# Patient Record
Sex: Female | Born: 1955 | Race: White | Hispanic: No | Marital: Married | Smoking: Former smoker
Health system: Southern US, Community
[De-identification: ages and names within clinical notes are randomized; demographics above are authoritative.]

## PROBLEM LIST (undated history)

## (undated) DIAGNOSIS — N809 Endometriosis, unspecified: Secondary | ICD-10-CM

## (undated) DIAGNOSIS — G43909 Migraine, unspecified, not intractable, without status migrainosus: Secondary | ICD-10-CM

## (undated) DIAGNOSIS — R12 Heartburn: Secondary | ICD-10-CM

## (undated) DIAGNOSIS — M1990 Unspecified osteoarthritis, unspecified site: Secondary | ICD-10-CM

## (undated) DIAGNOSIS — H18519 Endothelial corneal dystrophy, unspecified eye: Secondary | ICD-10-CM

## (undated) DIAGNOSIS — E785 Hyperlipidemia, unspecified: Secondary | ICD-10-CM

## (undated) DIAGNOSIS — I10 Essential (primary) hypertension: Secondary | ICD-10-CM

## (undated) DIAGNOSIS — K219 Gastro-esophageal reflux disease without esophagitis: Secondary | ICD-10-CM

## (undated) DIAGNOSIS — K635 Polyp of colon: Secondary | ICD-10-CM

## (undated) DIAGNOSIS — Z800 Family history of malignant neoplasm of digestive organs: Secondary | ICD-10-CM

## (undated) DIAGNOSIS — Z83719 Family history of colon polyps, unspecified: Secondary | ICD-10-CM

## (undated) DIAGNOSIS — M8580 Other specified disorders of bone density and structure, unspecified site: Secondary | ICD-10-CM

## (undated) DIAGNOSIS — Z9882 Breast implant status: Secondary | ICD-10-CM

## (undated) DIAGNOSIS — Z860100 Personal history of colon polyps, unspecified: Secondary | ICD-10-CM

## (undated) DIAGNOSIS — B977 Papillomavirus as the cause of diseases classified elsewhere: Secondary | ICD-10-CM

## (undated) DIAGNOSIS — C50919 Malignant neoplasm of unspecified site of unspecified female breast: Secondary | ICD-10-CM

## (undated) DIAGNOSIS — H269 Unspecified cataract: Secondary | ICD-10-CM

## (undated) DIAGNOSIS — E559 Vitamin D deficiency, unspecified: Secondary | ICD-10-CM

## (undated) DIAGNOSIS — Z8371 Family history of colonic polyps: Secondary | ICD-10-CM

## (undated) DIAGNOSIS — B009 Herpesviral infection, unspecified: Secondary | ICD-10-CM

## (undated) DIAGNOSIS — Z90710 Acquired absence of both cervix and uterus: Secondary | ICD-10-CM

## (undated) HISTORY — PX: WISDOM TOOTH EXTRACTION: SHX21

## (undated) HISTORY — DX: Hyperlipidemia, unspecified: E78.5

## (undated) HISTORY — DX: Unspecified cataract: H26.9

## (undated) HISTORY — DX: Personal history of colonic polyps: Z86.010

## (undated) HISTORY — DX: Polyp of colon: K63.5

## (undated) HISTORY — PX: BREAST BIOPSY: SHX20

## (undated) HISTORY — DX: Unspecified osteoarthritis, unspecified site: M19.90

## (undated) HISTORY — DX: Herpesviral infection, unspecified: B00.9

## (undated) HISTORY — DX: Breast implant status: Z98.82

## (undated) HISTORY — DX: Vitamin D deficiency, unspecified: E55.9

## (undated) HISTORY — DX: Other specified disorders of bone density and structure, unspecified site: M85.80

## (undated) HISTORY — PX: ABDOMINAL HYSTERECTOMY: SHX81

## (undated) HISTORY — DX: Gastro-esophageal reflux disease without esophagitis: K21.9

## (undated) HISTORY — PX: PARTIAL HYSTERECTOMY: SHX80

## (undated) HISTORY — DX: Endothelial corneal dystrophy, unspecified eye: H18.519

## (undated) HISTORY — DX: Family history of malignant neoplasm of digestive organs: Z80.0

## (undated) HISTORY — DX: Family history of colon polyps, unspecified: Z83.719

## (undated) HISTORY — PX: BREAST LUMPECTOMY: SHX2

## (undated) HISTORY — DX: Malignant neoplasm of unspecified site of unspecified female breast: C50.919

## (undated) HISTORY — PX: BREAST ENHANCEMENT SURGERY: SHX7

## (undated) HISTORY — DX: Endometriosis, unspecified: N80.9

## (undated) HISTORY — DX: Essential (primary) hypertension: I10

## (undated) HISTORY — DX: Migraine, unspecified, not intractable, without status migrainosus: G43.909

## (undated) HISTORY — DX: Papillomavirus as the cause of diseases classified elsewhere: B97.7

## (undated) HISTORY — DX: Heartburn: R12

## (undated) HISTORY — DX: Personal history of colon polyps, unspecified: Z86.0100

## (undated) HISTORY — PX: CATARACT EXTRACTION, BILATERAL: SHX1313

## (undated) HISTORY — PX: TONSILLECTOMY: SUR1361

## (undated) HISTORY — DX: Family history of colonic polyps: Z83.71

## (undated) HISTORY — DX: Acquired absence of both cervix and uterus: Z90.710

---

## 1989-12-14 DIAGNOSIS — Z90710 Acquired absence of both cervix and uterus: Secondary | ICD-10-CM

## 1989-12-14 HISTORY — DX: Acquired absence of both cervix and uterus: Z90.710

## 1991-12-15 HISTORY — PX: PLACEMENT OF BREAST IMPLANTS: SHX6334

## 1998-08-21 ENCOUNTER — Other Ambulatory Visit: Admission: RE | Admit: 1998-08-21 | Discharge: 1998-08-21 | Payer: Self-pay | Admitting: Family Medicine

## 1998-10-19 ENCOUNTER — Ambulatory Visit (HOSPITAL_COMMUNITY): Admission: RE | Admit: 1998-10-19 | Discharge: 1998-10-19 | Payer: Self-pay | Admitting: Family Medicine

## 1998-10-19 ENCOUNTER — Encounter: Payer: Self-pay | Admitting: Family Medicine

## 1999-06-28 ENCOUNTER — Emergency Department (HOSPITAL_COMMUNITY): Admission: EM | Admit: 1999-06-28 | Discharge: 1999-06-28 | Payer: Self-pay | Admitting: Emergency Medicine

## 2000-01-27 ENCOUNTER — Other Ambulatory Visit: Admission: RE | Admit: 2000-01-27 | Discharge: 2000-01-27 | Payer: Self-pay | Admitting: Obstetrics and Gynecology

## 2001-01-12 ENCOUNTER — Ambulatory Visit (HOSPITAL_COMMUNITY): Admission: RE | Admit: 2001-01-12 | Discharge: 2001-01-12 | Payer: Self-pay | Admitting: Gastroenterology

## 2001-01-12 ENCOUNTER — Encounter (INDEPENDENT_AMBULATORY_CARE_PROVIDER_SITE_OTHER): Payer: Self-pay | Admitting: Specialist

## 2001-02-01 ENCOUNTER — Other Ambulatory Visit: Admission: RE | Admit: 2001-02-01 | Discharge: 2001-02-01 | Payer: Self-pay | Admitting: Obstetrics and Gynecology

## 2001-09-30 ENCOUNTER — Encounter: Admission: RE | Admit: 2001-09-30 | Discharge: 2001-12-29 | Payer: Self-pay | Admitting: Obstetrics and Gynecology

## 2002-02-22 ENCOUNTER — Other Ambulatory Visit: Admission: RE | Admit: 2002-02-22 | Discharge: 2002-02-22 | Payer: Self-pay | Admitting: Obstetrics and Gynecology

## 2003-05-08 ENCOUNTER — Other Ambulatory Visit: Admission: RE | Admit: 2003-05-08 | Discharge: 2003-05-08 | Payer: Self-pay | Admitting: Obstetrics and Gynecology

## 2004-06-05 ENCOUNTER — Other Ambulatory Visit: Admission: RE | Admit: 2004-06-05 | Discharge: 2004-06-05 | Payer: Self-pay | Admitting: Obstetrics and Gynecology

## 2004-06-06 ENCOUNTER — Encounter: Admission: RE | Admit: 2004-06-06 | Discharge: 2004-06-06 | Payer: Self-pay | Admitting: Obstetrics and Gynecology

## 2004-07-14 ENCOUNTER — Ambulatory Visit (HOSPITAL_COMMUNITY): Admission: RE | Admit: 2004-07-14 | Discharge: 2004-07-14 | Payer: Self-pay | Admitting: Gastroenterology

## 2005-01-16 ENCOUNTER — Encounter: Admission: RE | Admit: 2005-01-16 | Discharge: 2005-01-16 | Payer: Self-pay | Admitting: Obstetrics and Gynecology

## 2005-06-08 ENCOUNTER — Encounter: Admission: RE | Admit: 2005-06-08 | Discharge: 2005-06-08 | Payer: Self-pay | Admitting: Obstetrics and Gynecology

## 2005-06-18 ENCOUNTER — Other Ambulatory Visit: Admission: RE | Admit: 2005-06-18 | Discharge: 2005-06-18 | Payer: Self-pay | Admitting: Obstetrics and Gynecology

## 2006-07-01 ENCOUNTER — Other Ambulatory Visit: Admission: RE | Admit: 2006-07-01 | Discharge: 2006-07-01 | Payer: Self-pay | Admitting: Obstetrics and Gynecology

## 2006-07-16 ENCOUNTER — Encounter: Admission: RE | Admit: 2006-07-16 | Discharge: 2006-07-16 | Payer: Self-pay | Admitting: Obstetrics and Gynecology

## 2007-07-20 ENCOUNTER — Encounter: Admission: RE | Admit: 2007-07-20 | Discharge: 2007-07-20 | Payer: Self-pay | Admitting: Obstetrics and Gynecology

## 2008-07-20 ENCOUNTER — Encounter: Admission: RE | Admit: 2008-07-20 | Discharge: 2008-07-20 | Payer: Self-pay | Admitting: Obstetrics and Gynecology

## 2009-07-23 ENCOUNTER — Encounter: Admission: RE | Admit: 2009-07-23 | Discharge: 2009-07-23 | Payer: Self-pay | Admitting: Obstetrics and Gynecology

## 2010-09-19 ENCOUNTER — Encounter: Admission: RE | Admit: 2010-09-19 | Discharge: 2010-09-19 | Payer: Self-pay | Admitting: Obstetrics and Gynecology

## 2011-01-04 ENCOUNTER — Encounter: Payer: Self-pay | Admitting: Obstetrics and Gynecology

## 2011-05-01 NOTE — Procedures (Signed)
[facility]  Patient:    Aimee Mcclain, Aimee Mcclain                    MRN: [id number] Proc. Date: 01/12/01 Adm. Date:  [id number] Attending:  Louie Bun CC:         Vikki Ports, M.D.                           Procedure Report  PROCEDURE:  Colonoscopy with polypectomy.  INDICATIONS FOR PROCEDURE:  Recent onset of rectal bleeding, with no obvious perianal source on prior inspection.  Also, The patient has a family history of colon cancer in a second-degree relative.  DESCRIPTION OF PROCEDURE:  The patient was placed in the left lateral decubitus position and placed on the pulse monitor with continuous low-flow oxygen delivered by nasal cannula.  She was sedated with 70 mg IV Demerol and 9 mg IV Versed.  The Olympus video colonoscope was inserted into the rectum and advanced to the cecum, confirmed by transillumination of McBurneys and visualization of the ileocecal valve and appendiceal orifice.  The prep was excellent.  The cecum, ascending, transverse and descending colon all appeared normal with no masses, polyps, diverticula or other mucosa abnormalities. Within the sigmoid colon there were seen two small sessile polyps, which were fulgurated by hot biopsy.  A third polyp was seen in the rectum about 2 cm from the anal verge, and it was approximately 8 mm in diameter, slightly pedunculated and was also removed by hot biopsy.  The remainder of the rectum appeared normal.  There was no obvious enlargement of any internal hemorrhoids.  The colonoscope was then withdrawn and the patient returned to the recovery room in stable condition.  She tolerated the procedure well and there were no immediate complications.  IMPRESSION: 1. Rectal and sigmoid colon polyps. 2. Otherwise normal colonoscopy.  PLAN:  Await histology for determination of interval for next surveillance colonoscopy. DD:  01/12/01 TD:  01/13/01 Job: [id number]

## 2011-05-01 NOTE — Op Note (Signed)
NAME:  Aimee Mcclain, Aimee Mcclain                       ACCOUNT NO.:  [id number]   MEDICAL RECORD NO.:  [id number]                   PATIENT TYPE:  AMB   LOCATION:  ENDO                                 FACILITY:  [facility]   PHYSICIAN:  John C. Madilyn Fireman, M.D.                 DATE OF BIRTH:  03-Jul-1956   DATE OF PROCEDURE:  07/14/2004  DATE OF DISCHARGE:                                 OPERATIVE REPORT   PROCEDURE PERFORMED:  Colonoscopy with biopsy.   ENDOSCOPIST:  Barrie Folk, M.D.   INDICATIONS FOR PROCEDURE:  History of adenomatous colon polyps on  colonoscopy three years ago.   DESCRIPTION OF PROCEDURE:  The patient was placed in the left lateral  decubitus position and placed on the pulse monitor with continuous low-flow  oxygen delivered by nasal cannula.  The patient was sedated with 100 mcg of  IV fentanyl and 9 mg of IV Versed.  The Olympus video colonoscope was  inserted into the rectum and advanced to the cecum, confirmed by  transillumination of McBurney's point and visualization of the ileocecal  valve and appendiceal orifice.  The prep was excellent.  The cecum,  ascending, transverse and descending colon appeared normal with no masses,  polyps, diverticula or other mucosal abnormalities.  The rectum likewise  appeared normal and retroflex view of the anus revealed no obvious internal  hemorrhoids.  The scope was then withdrawn and the patient returned to the  recovery room in stable condition.  She tolerated the procedure well.  There  were no immediate complications.   IMPRESSION:  Normal colonoscopy.   PLAN:  Next colonoscopy in five years.                                               John C. Madilyn Fireman, M.D.    JCH/MEDQ  D:  07/14/2004  T:  07/14/2004  Job:  [id number]   cc:   [facility]   Vikki Ports, M.D.  [address]  [place]  [place] [address]  Fax: [phone]

## 2011-10-06 ENCOUNTER — Other Ambulatory Visit: Payer: Self-pay | Admitting: Obstetrics and Gynecology

## 2011-10-06 DIAGNOSIS — Z1231 Encounter for screening mammogram for malignant neoplasm of breast: Secondary | ICD-10-CM

## 2011-11-02 ENCOUNTER — Ambulatory Visit
Admission: RE | Admit: 2011-11-02 | Discharge: 2011-11-02 | Disposition: A | Discharge status: Home / Self Care | Payer: BC Managed Care – PPO | Source: Ambulatory Visit | Attending: Obstetrics and Gynecology | Admitting: Obstetrics and Gynecology

## 2011-11-02 DIAGNOSIS — Z1231 Encounter for screening mammogram for malignant neoplasm of breast: Secondary | ICD-10-CM

## 2012-05-23 ENCOUNTER — Encounter: Payer: Self-pay | Admitting: Obstetrics and Gynecology

## 2012-05-23 ENCOUNTER — Ambulatory Visit (INDEPENDENT_AMBULATORY_CARE_PROVIDER_SITE_OTHER): Payer: BC Managed Care – PPO | Admitting: Obstetrics and Gynecology

## 2012-05-23 VITALS — BP 110/70 | HR 70 | Ht 67.5 in | Wt 176.0 lb

## 2012-05-23 DIAGNOSIS — B977 Papillomavirus as the cause of diseases classified elsewhere: Secondary | ICD-10-CM | POA: Insufficient documentation

## 2012-05-23 DIAGNOSIS — R5383 Other fatigue: Secondary | ICD-10-CM

## 2012-05-23 DIAGNOSIS — K635 Polyp of colon: Secondary | ICD-10-CM | POA: Insufficient documentation

## 2012-05-23 DIAGNOSIS — Z90710 Acquired absence of both cervix and uterus: Secondary | ICD-10-CM | POA: Insufficient documentation

## 2012-05-23 DIAGNOSIS — R5381 Other malaise: Secondary | ICD-10-CM

## 2012-05-23 DIAGNOSIS — Z9882 Breast implant status: Secondary | ICD-10-CM | POA: Insufficient documentation

## 2012-05-23 DIAGNOSIS — N809 Endometriosis, unspecified: Secondary | ICD-10-CM | POA: Insufficient documentation

## 2012-05-23 DIAGNOSIS — Z5181 Encounter for therapeutic drug level monitoring: Secondary | ICD-10-CM

## 2012-05-23 LAB — FOLLICLE STIMULATING HORMONE: FSH: 80.4 m[IU]/mL

## 2012-05-23 LAB — ESTRADIOL: Estradiol: 39.9 pg/mL

## 2012-05-23 LAB — PROGESTERONE: Progesterone: 0.7 ng/mL

## 2012-05-23 MED ORDER — AMBULATORY NON FORMULARY MEDICATION: 1.0000 mL | Freq: Every day | Status: DC

## 2012-05-23 NOTE — Progress Notes (Signed)
55 YO S/P Hysterectomy on BHRT ( Estradiol 0.05 + Progesterone 20 mg Cream) daily complains of awakening tired & sleepy and sleeps every time she sits down.  Nighttime sleep is restless (sleeping off & on) for past several months. She continues to have vaginal dryness and dyspareunia but admits  to not using Vagifem and also has hot flashes.  Hormone tests from 07/2011 revealed elevated progesterone and that amount was decreased however she is having the above symptoms after months of good management.  Has had annual labs done by PCP and they were normal per patient.  A: Menopausal Symptoms     Increased Somnolence  P: Sleep Hygiene and reviewed causes of decreased libido      in women (patient has fatigue and dyspareunia)      Vagifem as directed encouraged along with silicone based     vaginal moisturizer         TSH, FSH, estradiol and progesterone-pending       Estradiol .05mg  + Progesterone 10 mg Cream/64ml      #1 month apply 1 ml qd as directed 11 refills         RTO- AEx or TBA

## 2012-06-20 ENCOUNTER — Telehealth: Payer: Self-pay | Admitting: Obstetrics and Gynecology

## 2012-06-20 NOTE — Telephone Encounter (Signed)
TC TO PT REGARDING MESSAGE. PT WANTED HER TEST RESULTS. INFORMED PT THAT ALL TEST DONE ON 05/23/12 WAS WNL AND PT STATES THAT SHE WANT TO SPEAK WITH EP AND WANT TO KNOW DOES HER TEST RESULTS MEAN THAT PT OVARIES ARE WORKING.

## 2012-06-23 ENCOUNTER — Telehealth: Payer: Self-pay | Admitting: Obstetrics and Gynecology

## 2012-06-23 MED ORDER — AMBULATORY NON FORMULARY MEDICATION: 1.0000 mL | Freq: Every day | Status: DC

## 2012-06-23 NOTE — Telephone Encounter (Signed)
Phone call with patient to reiterate her menopausal state as indicated by [facility] = 80.  Advised that thyroid was normal and estradiol/progesterone levels were consistent with menopause (serum). Patient has resumed her Vagifem twice weekly and continues to complain of hot flushes, sleeplessness and moodiness. To increase estradiol to 0.75 mg and keep progesterone at  20 mg   Patient was agreeable.  To have a saliva kit mailed to repeat testing at the end of August. Nixon Kolton, PA-C

## 2012-09-20 ENCOUNTER — Other Ambulatory Visit: Payer: Self-pay

## 2012-09-20 NOTE — Telephone Encounter (Signed)
Fax refill okd ld 

## 2012-09-29 ENCOUNTER — Other Ambulatory Visit: Payer: Self-pay | Admitting: Obstetrics and Gynecology

## 2012-09-29 DIAGNOSIS — Z1231 Encounter for screening mammogram for malignant neoplasm of breast: Secondary | ICD-10-CM

## 2012-11-16 ENCOUNTER — Telehealth: Payer: Self-pay

## 2012-11-16 MED ORDER — ACYCLOVIR 200 MG PO CAPS: 200.0000 mg | ORAL_CAPSULE | ORAL | Status: AC | PRN

## 2012-11-16 NOTE — Telephone Encounter (Signed)
VM from pt stating that she is needing rx refill of acyclovir 200 mg. Advised pt that it is time for annual. Pt scheduled apt for 01/18/2013. Advised pt that rx would be sent to pharmacy with refills to last until apt time. Pt voiced understanding. Darien Ramus, CMA

## 2012-12-09 ENCOUNTER — Other Ambulatory Visit: Payer: Self-pay | Admitting: Obstetrics and Gynecology

## 2012-12-09 ENCOUNTER — Telehealth: Payer: Self-pay | Admitting: Obstetrics and Gynecology

## 2012-12-09 NOTE — Telephone Encounter (Signed)
Forward msg to EP

## 2012-12-09 NOTE — Telephone Encounter (Signed)
Patient on Estradiol 0.075 mg + Progesterone 20 mg Cream/ 1 mL and wants to discontinue it.  Should begin to use it every other day for the next 3 weeks as a means of tapering it. She may want to take at the same time Rhubarb Root [Estrovera] to help to minimize her symptoms while she tapers.  Leanora Ivanoff is also available at [facility]. Cybill Uriegas, PA-C

## 2012-12-13 ENCOUNTER — Other Ambulatory Visit: Payer: Self-pay | Admitting: Obstetrics and Gynecology

## 2012-12-13 ENCOUNTER — Ambulatory Visit
Admission: RE | Admit: 2012-12-13 | Discharge: 2012-12-13 | Disposition: A | Discharge status: Home / Self Care | Payer: BC Managed Care – PPO | Source: Ambulatory Visit | Attending: Obstetrics and Gynecology | Admitting: Obstetrics and Gynecology

## 2012-12-13 DIAGNOSIS — N63 Unspecified lump in unspecified breast: Secondary | ICD-10-CM

## 2012-12-13 DIAGNOSIS — N644 Mastodynia: Secondary | ICD-10-CM

## 2012-12-13 DIAGNOSIS — Z1231 Encounter for screening mammogram for malignant neoplasm of breast: Secondary | ICD-10-CM

## 2012-12-19 ENCOUNTER — Telehealth: Payer: Self-pay | Admitting: Obstetrics and Gynecology

## 2012-12-19 NOTE — Telephone Encounter (Signed)
Pt advised to start using med every other night per EP telephone note on 12/09/12. Pt upset that she never received call back. Pt voiced understanding, but is wanting to know what she needs to do after 3 weeks of every other night. Should she stop completely or use every 3 nights?  Darien Ramus, CMA

## 2012-12-19 NOTE — Telephone Encounter (Signed)
Call to clarify tapering regimen with patient regarding her hormone therapy.  Left message that a return call would be made.  Maximilien Hayashi, PA-C

## 2012-12-20 ENCOUNTER — Telehealth: Payer: Self-pay | Admitting: Obstetrics and Gynecology

## 2012-12-20 NOTE — Telephone Encounter (Signed)
Call to patient who is completing her current hormone prescription by using it every other day.  She will pick up samples of Minivelle 0.0375 to use once weekly after that then stop (total of 4 weeks).  Patient was agreeable. Pecola Haxton, PA-C

## 2013-01-03 ENCOUNTER — Ambulatory Visit
Admission: RE | Admit: 2013-01-03 | Discharge: 2013-01-03 | Disposition: A | Discharge status: Home / Self Care | Payer: BC Managed Care – PPO | Source: Ambulatory Visit | Attending: Obstetrics and Gynecology | Admitting: Obstetrics and Gynecology

## 2013-01-03 DIAGNOSIS — N644 Mastodynia: Secondary | ICD-10-CM

## 2013-01-03 DIAGNOSIS — N63 Unspecified lump in unspecified breast: Secondary | ICD-10-CM

## 2013-01-18 ENCOUNTER — Ambulatory Visit: Payer: BC Managed Care – PPO | Admitting: Obstetrics and Gynecology

## 2013-01-18 ENCOUNTER — Encounter: Payer: Self-pay | Admitting: Obstetrics and Gynecology

## 2013-01-18 VITALS — BP 128/68 | Ht 67.5 in | Wt 189.0 lb

## 2013-01-18 DIAGNOSIS — Z01419 Encounter for gynecological examination (general) (routine) without abnormal findings: Secondary | ICD-10-CM

## 2013-01-18 MED ORDER — VALACYCLOVIR HCL 500 MG PO TABS: 500.0000 mg | ORAL_TABLET | Freq: Two times a day (BID) | ORAL | Status: DC

## 2013-01-18 MED ORDER — ESTRADIOL 10 MCG VA TABS: 1.0000 | ORAL_TABLET | VAGINAL | Status: DC

## 2013-01-18 NOTE — Progress Notes (Signed)
The patient is taking hormone replacement therapy The patient  is taking a Calcium supplement. Post-menopausal bleeding:no  Last Pap: approximate date 2008 and was normal  Last mammogram: approximate date 01/03/2013 and was normal  Last DEXA scan : T= -1.5    08/2011 Last colonoscopy: 12/2012 biopsy was taken / results not back  Urinary symptoms: none Normal bowel movements: Yes Reports abuse at home: No:   Subjective:    Aimee Mcclain is a 57 y.o. female G2P2002 who presents for annual exam.  The patient has no complaints today.   The following portions of the patient's history were reviewed and updated as appropriate: allergies, current medications, past family history, past medical history, past social history, past surgical history and problem list.  Review of Systems Pertinent items are noted in HPI. Gastrointestinal:No change in bowel habits, no abdominal pain, no rectal bleeding Genitourinary:negative for dysuria, frequency, hematuria, nocturia and urinary incontinence    Objective:     There were no vitals taken for this visit.  Weight:  Wt Readings from Last 1 Encounters:  05/23/12 176 lb (79.833 kg)     BMI: There is no height or weight on file to calculate BMI. General Appearance: Alert, appropriate appearance for age. No acute distress HEENT: Grossly normal Neck / Thyroid: Supple, no masses, nodes or enlargement Lungs: clear to auscultation bilaterally Back: No CVA tenderness Breast Exam: No dimpling, nipple retraction or discharge. No masses or nodes., Normal to inspection and No masses or nodes.No dimpling, nipple retraction or discharge. Cardiovascular: Regular rate and rhythm. S1, S2, no murmur Gastrointestinal: Soft, non-tender, no masses or organomegaly Pelvic Exam: Vulva and vagina appear normal. Bimanual exam reveals normal  Adnexa. Uterus surgically absent Rectovaginal: deferred due to recent colonoscopy Lymphatic Exam: Non-palpable nodes in neck,  clavicular, axillary, or inguinal regions  Skin: no rash or abnormalities Neurologic: Normal gait and speech, no tremor  Psychiatric: Alert and oriented, appropriate affect. Assessment:    Normal gyn exam    Plan:   mammogram return annually or prn Hormone Therapy with EP Weight Gain  Vaginal Dryness Discussed (Pt currently using Vagifem) Estring Discussed    Silverio Lay MD

## 2013-11-30 ENCOUNTER — Other Ambulatory Visit: Payer: Self-pay

## 2013-11-30 DIAGNOSIS — Z1231 Encounter for screening mammogram for malignant neoplasm of breast: Secondary | ICD-10-CM

## 2013-12-29 ENCOUNTER — Other Ambulatory Visit: Payer: Self-pay

## 2013-12-29 DIAGNOSIS — Z1231 Encounter for screening mammogram for malignant neoplasm of breast: Secondary | ICD-10-CM

## 2013-12-29 DIAGNOSIS — Z9882 Breast implant status: Secondary | ICD-10-CM

## 2014-01-05 ENCOUNTER — Ambulatory Visit
Admission: RE | Admit: 2014-01-05 | Discharge: 2014-01-05 | Disposition: A | Discharge status: Home / Self Care | Payer: BC Managed Care – PPO | Source: Ambulatory Visit

## 2014-01-05 DIAGNOSIS — Z1231 Encounter for screening mammogram for malignant neoplasm of breast: Secondary | ICD-10-CM

## 2014-01-05 DIAGNOSIS — Z9882 Breast implant status: Secondary | ICD-10-CM

## 2014-10-15 ENCOUNTER — Encounter: Payer: Self-pay | Admitting: Obstetrics and Gynecology

## 2014-12-11 ENCOUNTER — Other Ambulatory Visit: Payer: Self-pay

## 2014-12-11 DIAGNOSIS — Z1231 Encounter for screening mammogram for malignant neoplasm of breast: Secondary | ICD-10-CM

## 2015-01-08 ENCOUNTER — Ambulatory Visit
Admission: RE | Admit: 2015-01-08 | Discharge: 2015-01-08 | Disposition: A | Discharge status: Home / Self Care | Payer: BLUE CROSS/BLUE SHIELD | Source: Ambulatory Visit

## 2015-01-08 ENCOUNTER — Encounter (INDEPENDENT_AMBULATORY_CARE_PROVIDER_SITE_OTHER): Payer: Self-pay

## 2015-01-08 ENCOUNTER — Other Ambulatory Visit: Payer: Self-pay

## 2015-01-08 DIAGNOSIS — Z1231 Encounter for screening mammogram for malignant neoplasm of breast: Secondary | ICD-10-CM

## 2018-06-03 ENCOUNTER — Encounter: Payer: Self-pay | Admitting: Gastroenterology

## 2018-06-06 ENCOUNTER — Telehealth: Payer: Self-pay

## 2018-06-06 NOTE — Telephone Encounter (Signed)
Spoke to patient to inform her that we have received records from Dr Lenard Simmer ossice.

## 2018-06-06 NOTE — Telephone Encounter (Signed)
-----   Message from Dow Adolph sent at 06/06/2018 11:28 AM EDT ----- Pt has an appt in Aug. And wants to know if we have received her records from Dr. Amedeo Plenty office yet [phone]

## 2018-08-09 ENCOUNTER — Ambulatory Visit (INDEPENDENT_AMBULATORY_CARE_PROVIDER_SITE_OTHER): Payer: BLUE CROSS/BLUE SHIELD | Admitting: Gastroenterology

## 2018-08-09 ENCOUNTER — Encounter: Payer: Self-pay | Admitting: Gastroenterology

## 2018-08-09 VITALS — BP 142/80 | HR 76 | Ht 67.0 in | Wt 183.0 lb

## 2018-08-09 DIAGNOSIS — K219 Gastro-esophageal reflux disease without esophagitis: Secondary | ICD-10-CM | POA: Diagnosis not present

## 2018-08-09 DIAGNOSIS — Z800 Family history of malignant neoplasm of digestive organs: Secondary | ICD-10-CM | POA: Diagnosis not present

## 2018-08-09 DIAGNOSIS — K5900 Constipation, unspecified: Secondary | ICD-10-CM

## 2018-08-09 MED ORDER — PEG 3350-KCL-NA BICARB-NACL 420 G PO SOLR: 4000.0000 mL | ORAL | 0 refills | Status: DC

## 2018-08-09 MED ORDER — OMEPRAZOLE 40 MG PO CPDR: 40.0000 mg | DELAYED_RELEASE_CAPSULE | Freq: Every day | ORAL | 11 refills | Status: DC

## 2018-08-09 NOTE — Patient Instructions (Addendum)
You will be set up for a colonoscopy with double prep protocol (for FH colon cancer). Start miralax 2 doses every single morning for now (for chronic constipation). Start omeprazole 40mg  pill, one pill every morning before your apple. Call if you have side effects.  Normal BMI (Body Mass Index- based on height and weight) is between 19 and 25. Your BMI today is Body mass index is 28.66 kg/m. Marland Kitchen Please consider follow up  regarding your BMI with your Primary Care Provider.

## 2018-08-09 NOTE — Progress Notes (Signed)
HPI: This is a  Very pleasant 62 yo woman  who was referred to me by Earnstine Regal, PA-C  to evaluate chronic constipation, family history colon cancer, GERD  Chief complaint is family history colon cancer, GERD, chronic constipation  Mother dx with colon cancer in her 63s, still living (surgery alone).  She's had polyps removed in the past.  She has never had colon cancer.  Several issues; chronic constipation.  Often goes a week without a BM.  Intermittent BM.    This has been a problem of hers for as long as she can remember.  Fiber supplements tend to make it worse.  She will intermittently have rectal bleeding.  Usually it is very minor however she had a significant event about a week ago where she had dripping blood from her bottom.  It eventually stopped.  She has no anal discomfort.  She has tried MiraLAX and it seems to help briefly, using it usually as a rescue only.  She has chronic GERD which seems to manifest as coughing and an acid taste in her mouth.  Nexium helped but she had headaches.  She now takes Tums several per day.  No dysphasia.  Overall weight fluctuates.    Has bloating, gassiness.    Old Data Reviewed:   Colonoscopy Dr. Teena Irani January 2014 indication "personal history of colonic polyps" findings patchy area of mildly erythematous and granular mucosa found in the ascending colon.  Biopsies were taken.  There is some small internal hemorrhoids.  The examination was otherwise normal.  Dr. Amedeo Plenty recommended a colonoscopy in 5 years "for surveillance".  Pathology from the colon showed "chronic, mildly active colitis, consistent with inflammatory bowel disease".  EGD, Dr. Amedeo Plenty January 2014 indication "esophageal reflux, failure to respond to medical treatment".  Findings esophagus was normal.  There is some mild gastritis in the gastric antrum which was biopsied.  The duodenum was normal.  The biopsies from the stomach showed "benign gastric mucosa.  No H.  pylori dysplasia or malignancy".  There is written instructions from Dr. Amedeo Plenty indicating that he increased her proton pump inhibitor to twice daily.     Review of systems: Pertinent positive and negative review of systems were noted in the above HPI section. All other review negative.   Past Medical History:  Diagnosis Date  . Colon polyps   . Endometriosis   . H/O bilateral breast implants   . HPV in female   . HSV (herpes simplex virus) infection   . S/P TAH (total abdominal hysterectomy) 1991    Past Surgical History:  Procedure Laterality Date  . BREAST ENHANCEMENT SURGERY    . PARTIAL HYSTERECTOMY    . TONSILLECTOMY    . WISDOM TOOTH EXTRACTION      Current Outpatient Medications  Medication Sig Dispense Refill  . estradiol (VIVELLE-DOT) 0.05 MG/24HR patch Place 0.05 mg onto the skin once a week.    . valACYclovir (VALTREX) 500 MG tablet Take 1 tablet (500 mg total) by mouth 2 (two) times daily. 30 tablet 11   No current facility-administered medications for this visit.     Allergies as of 08/09/2018 - Review Complete 08/09/2018  Allergen Reaction Noted  . Ibuprofen Swelling 05/23/2012  . Vioxx [rofecoxib] Swelling and Rash 05/23/2012    Family History  Problem Relation Age of Onset  . Colon cancer Maternal Grandmother   . Heart disease Maternal Grandfather   . Colon cancer Father   . Heart disease Mother   .  Diabetes Brother   . Diabetes Brother   . Cancer Maternal Aunt        lyphoma  . Heart disease Maternal Uncle     Social History   Socioeconomic History  . Marital status: Married    Spouse name: Not on file  . Number of children: Not on file  . Years of education: Not on file  . Highest education level: Not on file  Occupational History  . Not on file  Social Needs  . Financial resource strain: Not on file  . Food insecurity:    Worry: Not on file    Inability: Not on file  . Transportation needs:    Medical: Not on file     Non-medical: Not on file  Tobacco Use  . Smoking status: Never Smoker  . Smokeless tobacco: Never Used  Substance and Sexual Activity  . Alcohol use: Yes    Comment: occasional  . Drug use: No  . Sexual activity: Yes    Comment: hyst  Lifestyle  . Physical activity:    Days per week: Not on file    Minutes per session: Not on file  . Stress: Not on file  Relationships  . Social connections:    Talks on phone: Not on file    Gets together: Not on file    Attends religious service: Not on file    Active member of club or organization: Not on file    Attends meetings of clubs or organizations: Not on file    Relationship status: Not on file  . Intimate partner violence:    Fear of current or ex partner: Not on file    Emotionally abused: Not on file    Physically abused: Not on file    Forced sexual activity: Not on file  Other Topics Concern  . Not on file  Social History Narrative  . Not on file     Physical Exam: BP (!) 142/80   Pulse 76   Ht 5\' 7"  (1.702 m)   Wt 183 lb (83 kg)   BMI 28.66 kg/m  Constitutional: generally well-appearing Psychiatric: alert and oriented x3 Eyes: extraocular movements intact Mouth: oral pharynx moist, no lesions Neck: supple no lymphadenopathy Cardiovascular: heart regular rate and rhythm Lungs: clear to auscultation bilaterally Abdomen: soft, nontender, nondistended, no obvious ascites, no peritoneal signs, normal bowel sounds Extremities: no lower extremity edema bilaterally Skin: no lesions on visible extremities   Assessment and plan: 62 y.o. female with chronic constipation, family history colon cancer, chronic GERD without alarm symptoms  Her mother and her maternal grandfather had colon cancer.  Her last screening examination was a little over 5 years ago and I recommended we repeat that for her now with a colonoscopy at her soonest convenience.  Given her chronic constipation I suggested a double prep protocol.  She has  chronic constipation.  It seems likely functional.  It is been lifelong.  I recommended she try 2 doses of MiraLAX every single day to see if that helps.  I will adjust her medicines at the time of her colonoscopy if needed.  She has chronic GERD with fairly classic symptoms.  Headaches with Nexium.  I am going to try her on omeprazole for now.  She will call or update me the time of her colonoscopy if the omeprazole is helping.  Hopefully she does not have headaches from it as well.    Please see the "Patient Instructions" section for addition details  about the plan.   Owens Loffler, MD [facility] 08/09/2018, 2:36 PM  Cc: Earnstine Regal, PA-C

## 2018-09-27 ENCOUNTER — Encounter: Payer: Self-pay | Admitting: Gastroenterology

## 2018-10-10 ENCOUNTER — Other Ambulatory Visit: Payer: Self-pay

## 2018-10-10 ENCOUNTER — Ambulatory Visit (AMBULATORY_SURGERY_CENTER): Payer: BLUE CROSS/BLUE SHIELD | Admitting: Gastroenterology

## 2018-10-10 ENCOUNTER — Encounter: Payer: Self-pay | Admitting: Gastroenterology

## 2018-10-10 VITALS — BP 117/76 | HR 56 | Temp 97.8°F | Resp 14 | Ht 67.0 in | Wt 183.0 lb

## 2018-10-10 DIAGNOSIS — Z86010 Personal history of colonic polyps: Secondary | ICD-10-CM

## 2018-10-10 DIAGNOSIS — D128 Benign neoplasm of rectum: Secondary | ICD-10-CM

## 2018-10-10 DIAGNOSIS — K639 Disease of intestine, unspecified: Secondary | ICD-10-CM

## 2018-10-10 DIAGNOSIS — Z1211 Encounter for screening for malignant neoplasm of colon: Secondary | ICD-10-CM | POA: Diagnosis not present

## 2018-10-10 DIAGNOSIS — K621 Rectal polyp: Secondary | ICD-10-CM

## 2018-10-10 DIAGNOSIS — Z800 Family history of malignant neoplasm of digestive organs: Secondary | ICD-10-CM | POA: Diagnosis not present

## 2018-10-10 DIAGNOSIS — K529 Noninfective gastroenteritis and colitis, unspecified: Secondary | ICD-10-CM | POA: Diagnosis not present

## 2018-10-10 MED ORDER — SODIUM CHLORIDE 0.9 % IV SOLN: 500.0000 mL | Freq: Once | INTRAVENOUS | Status: DC

## 2018-10-10 NOTE — Op Note (Signed)
[facility] Patient Name: Aimee Mcclain Procedure Date: 10/10/2018 8:18 AM MRN: [id number] Endoscopist: Milus Banister , MD Age: 62 Referring MD:  Date of Birth: Dec 31, 1955 Gender: Female Account #: [id number] Procedure:                Colonoscopy Indications:              Screening in patient at increased risk: Family                            history of 1st-degree relative with colorectal                            cancer; Colonoscopy Dr. Teena Irani January 2014                            indication "personal history of colonic polyps"                            findings patchy area of mildly erythematous and                            granular mucosa found in the ascending colon.                            Biopsies were taken. There is some small internal                            hemorrhoids. The examination was otherwise normal.                            Dr. Amedeo Plenty recommended a colonoscopy in 5 years "for                            surveillance". Pathology from the colon showed                            "chronic, mildly active colitis, consistent with                            inflammatory bowel disease". Mother and father and                            granparent all with colon cancer. Medicines:                Monitored Anesthesia Care Procedure:                Pre-Anesthesia Assessment:                           - Prior to the procedure, a History and Physical                            was performed, and patient medications and  allergies were reviewed. The patient's tolerance of                            previous anesthesia was also reviewed. The risks                            and benefits of the procedure and the sedation                            options and risks were discussed with the patient.                            All questions were answered, and informed consent                            was obtained. Prior  Anticoagulants: The patient has                            taken no previous anticoagulant or antiplatelet                            agents. ASA Grade Assessment: II - A patient with                            mild systemic disease. After reviewing the risks                            and benefits, the patient was deemed in                            satisfactory condition to undergo the procedure.                           After obtaining informed consent, the colonoscope                            was passed under direct vision. Throughout the                            procedure, the patient's blood pressure, pulse, and                            oxygen saturations were monitored continuously. The                            Colonoscope was introduced through the anus and                            advanced to the the cecum, identified by                            appendiceal orifice and ileocecal valve. The  colonoscopy was performed without difficulty. The                            patient tolerated the procedure well. The quality                            of the bowel preparation was good. The ileocecal                            valve, appendiceal orifice, and rectum were                            photographed. Scope In: 8:26:36 AM Scope Out: 8:48:48 AM Scope Withdrawal Time: 0 hours 16 minutes 49 seconds  Total Procedure Duration: 0 hours 22 minutes 12 seconds  Findings:                 Several small scattered erosions throughout the                            colon (right colon > left colon). They were sampled                            with forceps, path jar 1.                           A 3 mm polyp was found in the rectum. The polyp was                            sessile. The polyp was removed with a cold snare.                            Resection and retrieval were complete.                           The exam was otherwise without abnormality on                             direct and retroflexion views. Complications:            No immediate complications. Estimated blood loss:                            None. Estimated Blood Loss:     Estimated blood loss: none. Impression:               - Erythematous mucosa in the entire examined colon.                            Biopsied.                           - One 3 mm polyp in the rectum, removed with a cold                            snare.  Resected and retrieved.                           - The examination was otherwise normal on direct                            and retroflexion views. Recommendation:           - Patient has a contact number available for                            emergencies. The signs and symptoms of potential                            delayed complications were discussed with the                            patient. Return to normal activities tomorrow.                            Written discharge instructions were provided to the                            patient.                           - Resume previous diet.                           - Continue present medications.                           - Dr. Ardis Hughs' office will arrange referral to [facility] given your strong                            family history of colon cancer.                           You will receive a letter within 2-3 weeks with the                            pathology results and my final recommendations.                           If the polyp(s) is proven to be 'pre-cancerous' on                            pathology, you will need repeat colonoscopy in 5                            years. Milus Banister, MD 10/10/2018 8:53:50 AM This report has been signed electronically.

## 2018-10-10 NOTE — Patient Instructions (Signed)
Handouts given on polyps and biopsies were taken today on areas of inflammation.   YOU HAD AN ENDOSCOPIC PROCEDURE TODAY AT [facility]:   Refer to the procedure report that was given to you for any specific questions about what was found during the examination.  If the procedure report does not answer your questions, please call your gastroenterologist to clarify.  If you requested that your care partner not be given the details of your procedure findings, then the procedure report has been included in a sealed envelope for you to review at your convenience later.  YOU SHOULD EXPECT: Some feelings of bloating in the abdomen. Passage of more gas than usual.  Walking can help get rid of the air that was put into your GI tract during the procedure and reduce the bloating. If you had a lower endoscopy (such as a colonoscopy or flexible sigmoidoscopy) you may notice spotting of blood in your stool or on the toilet paper. If you underwent a bowel prep for your procedure, you may not have a normal bowel movement for a few days.  Please Note:  You might notice some irritation and congestion in your nose or some drainage.  This is from the oxygen used during your procedure.  There is no need for concern and it should clear up in a day or so.  SYMPTOMS TO REPORT IMMEDIATELY:   Following lower endoscopy (colonoscopy or flexible sigmoidoscopy):  Excessive amounts of blood in the stool  Significant tenderness or worsening of abdominal pains  Swelling of the abdomen that is new, acute  Fever of 100F or higher   For urgent or emergent issues, a gastroenterologist can be reached at any hour by calling [phone].   DIET:  We do recommend a small meal at first, but then you may proceed to your regular diet.  Drink plenty of fluids but you should avoid alcoholic beverages for 24 hours.  ACTIVITY:  You should plan to take it easy for the rest of today and you should NOT DRIVE or use heavy  machinery until tomorrow (because of the sedation medicines used during the test).    FOLLOW UP: Our staff will call the number listed on your records the next business day following your procedure to check on you and address any questions or concerns that you may have regarding the information given to you following your procedure. If we do not reach you, we will leave a message.  However, if you are feeling well and you are not experiencing any problems, there is no need to return our call.  We will assume that you have returned to your regular daily activities without incident.  If any biopsies were taken you will be contacted by phone or by letter within the next 1-3 weeks.  Please call us at [phone] if you have not heard about the biopsies in 3 weeks.    SIGNATURES/CONFIDENTIALITY: You and/or your care partner have signed paperwork which will be entered into your electronic medical record.  These signatures attest to the fact that that the information above on your After Visit Summary has been reviewed and is understood.  Full responsibility of the confidentiality of this discharge information lies with you and/or your care-partner.

## 2018-10-10 NOTE — Progress Notes (Signed)
Called to room to assist during endoscopic procedure.  Patient ID and intended procedure confirmed with present staff. Received instructions for my participation in the procedure from the performing physician.  

## 2018-10-10 NOTE — Progress Notes (Signed)
History reviewed today 

## 2018-10-10 NOTE — Progress Notes (Signed)
Report given to PACU, vss 

## 2018-10-11 ENCOUNTER — Telehealth: Payer: Self-pay | Admitting: *Deleted

## 2018-10-11 ENCOUNTER — Other Ambulatory Visit: Payer: Self-pay

## 2018-10-11 DIAGNOSIS — Z800 Family history of malignant neoplasm of digestive organs: Secondary | ICD-10-CM

## 2018-10-11 NOTE — Telephone Encounter (Signed)
No answer for post procedure call back. Left message and will attempt to call back later this afternoon. Sm 

## 2018-10-11 NOTE — Telephone Encounter (Signed)
No answer for post procedure call back left message for patient to call with questions or concerns. Sm 

## 2018-11-21 ENCOUNTER — Encounter: Payer: Self-pay | Admitting: Gastroenterology

## 2018-11-21 ENCOUNTER — Ambulatory Visit (INDEPENDENT_AMBULATORY_CARE_PROVIDER_SITE_OTHER): Payer: BLUE CROSS/BLUE SHIELD | Admitting: Gastroenterology

## 2018-11-21 VITALS — BP 120/90 | HR 64 | Ht 66.5 in | Wt 184.5 lb

## 2018-11-21 DIAGNOSIS — Z800 Family history of malignant neoplasm of digestive organs: Secondary | ICD-10-CM

## 2018-11-21 DIAGNOSIS — K639 Disease of intestine, unspecified: Secondary | ICD-10-CM | POA: Diagnosis not present

## 2018-11-21 NOTE — Progress Notes (Signed)
Review of pertinent gastrointestinal problems: 1. Elevated risk for colon cancer, Mother had colon cancer in her 55s. Father also had colon cancer. Colonoscopy 2014 no polyps.  Colonoscopy 09/2018 Dr. Ardis Hughs found subCM hyperplastic polyp and recommended recall at 5 years. 2. Abnormal colon mucosa, colitis:  Colonoscopy 2014 Dr. Amedeo Plenty patchy area of mildly erythematous and granular mucosa found in the ascending colon.  Pathology from the colon showed "chronic, mildly active colitis, consistent with inflammatory bowel disease"  Colonoscopy 2019 Dr. Ardis Hughs found several small scattered erosions (right colon>left colon). Path showed mild active inflammation; ddx per pathologist includes infection, NSAIDs.  2019; Does not take NSAIDs, never loose stools or bleeding.   EGD, Dr. Amedeo Plenty January 2014 indication "esophageal reflux, failure to respond to medical treatment".  Findings esophagus was normal.  There is some mild gastritis in the gastric antrum which was biopsied.  The duodenum was normal.  The biopsies from the stomach showed "benign gastric mucosa.  No H. pylori dysplasia or malignancy".  There is written instructions from Dr. Amedeo Plenty indicating that he increased her proton pump inhibitor to twice daily.   HPI: This is a very pleasant 62 year old woman whom I last saw at the time of colonoscopy about a month and a half ago.  See those results summarized above.  Chief complaint is family history colon cancer  Chronic constipation, she tried daily miralax.  She takes it on an as-needed basis now.  She also takes Prilosec on an as-needed basis only.  She is happy with the way this works and she is happy with her bowels overall.  She is never really bothered by loose stools or bloody stools or serious lasting abdominal pains.  ROS: complete GI ROS as described in HPI, all other review negative.  Constitutional:  No unintentional weight loss   Past Medical History:  Diagnosis Date  . Cataract    . Colon polyps   . Endometriosis   . H/O bilateral breast implants   . HPV in female   . HSV (herpes simplex virus) infection   . S/P TAH (total abdominal hysterectomy) 1991    Past Surgical History:  Procedure Laterality Date  . BREAST ENHANCEMENT SURGERY    . PARTIAL HYSTERECTOMY    . TONSILLECTOMY    . WISDOM TOOTH EXTRACTION      Current Outpatient Medications  Medication Sig Dispense Refill  . estradiol (VIVELLE-DOT) 0.05 MG/24HR patch Place 0.05 mg onto the skin once a week.    Marland Kitchen omeprazole (PRILOSEC) 40 MG capsule Take 1 capsule (40 mg total) by mouth daily. 30 capsule 11  . valACYclovir (VALTREX) 500 MG tablet Take 1 tablet (500 mg total) by mouth 2 (two) times daily. 30 tablet 11   No current facility-administered medications for this visit.     Allergies as of 11/21/2018 - Review Complete 11/21/2018  Allergen Reaction Noted  . Ibuprofen Swelling 05/23/2012  . Vioxx [rofecoxib] Swelling and Rash 05/23/2012    Family History  Problem Relation Age of Onset  . Colon cancer Maternal Grandmother   . Heart disease Maternal Grandfather   . Colon cancer Father   . Heart disease Mother   . Colon cancer Mother   . Rectal cancer Mother   . Diabetes Brother   . Colon polyps Brother   . Diabetes Brother   . Colon polyps Brother   . Lymphoma Maternal Aunt   . Heart disease Maternal Uncle   . Crohn's disease Daughter   . Esophageal cancer Neg Hx   .  Stomach cancer Neg Hx     Social History   Socioeconomic History  . Marital status: Married    Spouse name: Not on file  . Number of children: Not on file  . Years of education: Not on file  . Highest education level: Not on file  Occupational History  . Not on file  Social Needs  . Financial resource strain: Not on file  . Food insecurity:    Worry: Not on file    Inability: Not on file  . Transportation needs:    Medical: Not on file    Non-medical: Not on file  Tobacco Use  . Smoking status: Never Smoker   . Smokeless tobacco: Never Used  Substance and Sexual Activity  . Alcohol use: Yes    Comment: occasional  . Drug use: No  . Sexual activity: Yes    Comment: hyst  Lifestyle  . Physical activity:    Days per week: Not on file    Minutes per session: Not on file  . Stress: Not on file  Relationships  . Social connections:    Talks on phone: Not on file    Gets together: Not on file    Attends religious service: Not on file    Active member of club or organization: Not on file    Attends meetings of clubs or organizations: Not on file    Relationship status: Not on file  . Intimate partner violence:    Fear of current or ex partner: Not on file    Emotionally abused: Not on file    Physically abused: Not on file    Forced sexual activity: Not on file  Other Topics Concern  . Not on file  Social History Narrative  . Not on file     Physical Exam: BP 120/90 (BP Location: Left Arm, Patient Position: Sitting, Cuff Size: Normal)   Pulse 64   Ht 5' 6.5" (1.689 m) Comment: height measured without shoes  Wt 184 lb 8 oz (83.7 kg)   BMI 29.33 kg/m  Constitutional: generally well-appearing Psychiatric: alert and oriented x3 Abdomen: soft, nontender, nondistended, no obvious ascites, no peritoneal signs, normal bowel sounds No peripheral edema noted in lower extremities  Assessment and plan: 62 y.o. female with strong family history colon cancer, abnormal mucosa on colonoscopies  Her mother and father both had colon cancer and 1 of her grandparents also had colon cancer.  I recommended referral to genetic counselor to check for familial cancer syndromes.  She has had twice now abnormal mucosa in her colon, patchy erythema.  She has 0 symptoms of underlying colitis.  I think that the findings might be simply a reaction to the prep.  She knows to call if she has serious diarrhea, bleeding or abdominal pains.  Please see the "Patient Instructions" section for addition details  about the plan.  Owens Loffler, MD [facility] 11/21/2018, 3:39 PM

## 2018-11-21 NOTE — Patient Instructions (Addendum)
Referral to genetics for strong FH of colon cancer (mother, father, grandparent). New PCP referral through [facility].  Call if you have new, concerning GI issues.  Thank you for entrusting me with your care and choosing [facility].  Dr Ardis Hughs

## 2018-11-21 NOTE — Addendum Note (Signed)
Addended by: Grace Bushy A on: 11/21/2018 04:03 PM   Modules accepted: Orders

## 2018-12-01 ENCOUNTER — Encounter: Payer: Self-pay | Admitting: Genetic Counselor

## 2018-12-01 ENCOUNTER — Telehealth: Payer: Self-pay | Admitting: Genetic Counselor

## 2018-12-01 NOTE — Telephone Encounter (Signed)
A genetic counseling appt has been scheduled for the pt to see Roma Kayser on 01/02/19 at 9am. Pt agreed to the appt date and time. Letter mailed.

## 2018-12-05 DIAGNOSIS — R0602 Shortness of breath: Secondary | ICD-10-CM | POA: Diagnosis not present

## 2018-12-05 DIAGNOSIS — J018 Other acute sinusitis: Secondary | ICD-10-CM | POA: Diagnosis not present

## 2019-01-02 ENCOUNTER — Encounter: Payer: Self-pay | Admitting: Genetic Counselor

## 2019-01-02 ENCOUNTER — Inpatient Hospital Stay: Payer: BLUE CROSS/BLUE SHIELD | Attending: Genetic Counselor | Admitting: Genetic Counselor

## 2019-01-02 ENCOUNTER — Inpatient Hospital Stay: Payer: BLUE CROSS/BLUE SHIELD

## 2019-01-02 DIAGNOSIS — Z8371 Family history of colonic polyps: Secondary | ICD-10-CM | POA: Diagnosis not present

## 2019-01-02 DIAGNOSIS — Z86010 Personal history of colonic polyps: Secondary | ICD-10-CM

## 2019-01-02 DIAGNOSIS — Z800 Family history of malignant neoplasm of digestive organs: Secondary | ICD-10-CM | POA: Diagnosis not present

## 2019-01-02 DIAGNOSIS — Z83719 Family history of colon polyps, unspecified: Secondary | ICD-10-CM | POA: Insufficient documentation

## 2019-01-02 NOTE — Progress Notes (Signed)
REFERRING PROVIDER: Milus Banister, MD [address], [place] [address]  PRIMARY PROVIDER:  Guadlupe Spanish, MD  PRIMARY REASON FOR VISIT:  1. Family history of colon cancer   2. Family history of colonic polyps   3. History of colon polyps      HISTORY OF PRESENT ILLNESS:   Aimee Mcclain, a 63 y.o. female, was seen for a [place] cancer genetics consultation at the request of Dr. Ardis Hughs due to personal history of colon polyps and family history of cancer.  Aimee Mcclain presents to clinic today to discuss the possibility of a hereditary predisposition to cancer, genetic testing, and to further clarify her future cancer risks, as well as potential cancer risks for family members.   Aimee Mcclain is a 63 y.o. female with no personal history of cancer.  She has a personal history of colon polyps, with four colon polyps identified on colonoscopy.  She reports having a hysterectomy due to uterine polyps.  CANCER HISTORY:   No history exists.     HORMONAL RISK FACTORS:  Menarche was at age 73-16.  First live birth at age 9.  OCP use for approximately 10+ years.  Ovaries intact: yes.  Hysterectomy: yes.  Menopausal status: postmenopausal.  HRT use: 2 years. Colonoscopy: yes; 4 polyps. Mammogram within the last year: yes. Number of breast biopsies: 2. Up to date with pelvic exams:  yes. Any excessive radiation exposure in the past:  no  Past Medical History:  Diagnosis Date  . Cataract   . Colon polyps   . Endometriosis   . Family history of colon cancer   . Family history of colonic polyps   . H/O bilateral breast implants   . History of colon polyps   . HPV in female   . HSV (herpes simplex virus) infection   . S/P TAH (total abdominal hysterectomy) 1991    Past Surgical History:  Procedure Laterality Date  . BREAST ENHANCEMENT SURGERY    . PARTIAL HYSTERECTOMY    . TONSILLECTOMY    . WISDOM TOOTH EXTRACTION      Social History   Socioeconomic  History  . Marital status: Married    Spouse name: Not on file  . Number of children: Not on file  . Years of education: Not on file  . Highest education level: Not on file  Occupational History  . Not on file  Social Needs  . Financial resource strain: Not on file  . Food insecurity:    Worry: Not on file    Inability: Not on file  . Transportation needs:    Medical: Not on file    Non-medical: Not on file  Tobacco Use  . Smoking status: Never Smoker  . Smokeless tobacco: Never Used  Substance and Sexual Activity  . Alcohol use: Yes    Comment: occasional  . Drug use: No  . Sexual activity: Yes    Comment: hyst  Lifestyle  . Physical activity:    Days per week: Not on file    Minutes per session: Not on file  . Stress: Not on file  Relationships  . Social connections:    Talks on phone: Not on file    Gets together: Not on file    Attends religious service: Not on file    Active member of club or organization: Not on file    Attends meetings of clubs or organizations: Not on file    Relationship status: Not on file  Other Topics Concern  . Not on file  Social History Narrative  . Not on file     FAMILY HISTORY:  We obtained a detailed, 4-generation family history.  Significant diagnoses are listed below: Family History  Problem Relation Age of Onset  . Dementia Maternal Grandmother   . Heart disease Maternal Grandfather   . Colon cancer Maternal Grandfather        dx over 27  . Colon cancer Father        d. 53  . Heart disease Mother   . Colon cancer Mother        dx late 71s-70s  . Rectal cancer Mother   . Diabetes Brother   . Colon polyps Brother   . Diabetes Brother   . Colon polyps Brother   . Lymphoma Maternal Aunt   . Heart disease Maternal Uncle   . Crohn's disease Daughter   . Esophageal cancer Neg Hx   . Stomach cancer Neg Hx     The patient has two daughters who are cancer free.  She has two brothers who are cancer free, but reportedly  have colon polyps.  Her father is deceased and her mother is living.  The patient's father has a few other children who are cancer free.  Her father died at 64 and had been diagnosed with colon cancer.  She did not grow up with her father so she is unfamiliar with his family history.  The patient's mother had colon cancer in her late 61's-70's.  She had one brother and one sister.  The sister had lymphoma.  The maternal grandparents are deceased.  The grandfather had colon cancer.  Aimee Mcclain is unaware of previous family history of genetic testing for hereditary cancer risks. Patient's maternal ancestors are of Caucasian descent, and paternal ancestors are of Caucasian descent. There is no reported Ashkenazi Jewish ancestry. There is no known consanguinity.  GENETIC COUNSELING ASSESSMENT: Aimee Mcclain is a 63 y.o. female with a personal history of colon polyps and family history of colon polyps and cancer which is somewhat suggestive of a hereditary cancer syndrome and predisposition to cancer. We, therefore, discussed and recommended the following at today's visit.   DISCUSSION: We discussed that colon polyps are relatively common but that most individuals have only a few.  When an individual has more than 10 polyps it becomes concerning that there is a hereditary cause for their polyps.  If there is colon cancer in the family, polyps can be a predisposition to that cancer, and therefore can be part of the consideration of testing.  About 5-7% of colon cancer is hereditary, with most cases due to Lynch syndrome.  Based on the patient's personal history of colon polyps and family history of colon cancer, she does not meet strict criteria for genetic testing.  However, we can still offer testing, and out of pocket costs would be around $250.  We reviewed the characteristics, features and inheritance patterns of hereditary cancer syndromes. We also discussed genetic testing, including the  appropriate family members to test, the process of testing, insurance coverage and turn-around-time for results. We discussed the implications of a negative, positive and/or variant of uncertain significant result. We recommended Aimee Mcclain pursue genetic testing for the Multi-cancer gene panel.   PLAN: After considering the risks, benefits, and limitations, Aimee Mcclain  provided informed consent to pursue genetic testing and the blood sample was sent to [facility] for analysis of the multi-cancer panel. Results should  be available within approximately 2-3 weeks' time, at which point they will be disclosed by telephone to Aimee Mcclain, as will any additional recommendations warranted by these results. Aimee Mcclain will receive a summary of her genetic counseling visit and a copy of her results once available. This information will also be available in Epic. We encouraged Aimee Mcclain to remain in contact with cancer genetics annually so that we can continuously update the family history and inform her of any changes in cancer genetics and testing that may be of benefit for her family. Aimee Mcclain's questions were answered to her satisfaction today. Our contact information was provided should additional questions or concerns arise.  Lastly, we encouraged Aimee Mcclain to remain in contact with cancer genetics annually so that we can continuously update the family history and inform her of any changes in cancer genetics and testing that may be of benefit for this family.   Ms.  Mcclain's questions were answered to her satisfaction today. Our contact information was provided should additional questions or concerns arise. Thank you for the referral and allowing [place] to share in the care of your patient.   Azyria Osmon P. Florene Glen, [place], [facility] Certified Genetic Counselor [email or web address] phone: [phone]  The patient was seen for a total of 45 minutes in face-to-face genetic counseling.  This  patient was discussed with Drs. Magrinat, Lindi Adie and/or Burr Medico who agrees with the above.    _______________________________________________________________________ For Office Staff:  Number of people involved in session: 1 Was an Intern/ student involved with case: Vanita Panda

## 2019-01-16 ENCOUNTER — Encounter: Payer: Self-pay | Admitting: Genetic Counselor

## 2019-01-16 ENCOUNTER — Ambulatory Visit: Payer: Self-pay | Admitting: Genetic Counselor

## 2019-01-16 ENCOUNTER — Telehealth: Payer: Self-pay | Admitting: Genetic Counselor

## 2019-01-16 DIAGNOSIS — Z8371 Family history of colonic polyps: Secondary | ICD-10-CM

## 2019-01-16 DIAGNOSIS — Z7189 Other specified counseling: Secondary | ICD-10-CM | POA: Insufficient documentation

## 2019-01-16 DIAGNOSIS — Z1379 Encounter for other screening for genetic and chromosomal anomalies: Secondary | ICD-10-CM | POA: Insufficient documentation

## 2019-01-16 DIAGNOSIS — Z800 Family history of malignant neoplasm of digestive organs: Secondary | ICD-10-CM

## 2019-01-16 DIAGNOSIS — Z86010 Personal history of colonic polyps: Secondary | ICD-10-CM

## 2019-01-16 NOTE — Progress Notes (Signed)
HPI:  Aimee Mcclain was previously seen in the [facility] due to a personal history of colon polyps and family history of cancer and concerns regarding a hereditary predisposition to cancer. Please refer to our prior cancer genetics clinic note for more information regarding Aimee Mcclain medical, social and family histories, and our assessment and recommendations, at the time. Aimee Mcclain's recent genetic test results were disclosed to her, as were recommendations warranted by these results. These results and recommendations are discussed in more detail below.  CANCER HISTORY:   No history exists.    FAMILY HISTORY:  We obtained a detailed, 4-generation family history.  Significant diagnoses are listed below: Family History  Problem Relation Age of Onset  . Dementia Maternal Grandmother   . Heart disease Maternal Grandfather   . Colon cancer Maternal Grandfather        dx over 29  . Colon cancer Father        d. 79  . Heart disease Mother   . Colon cancer Mother        dx late 58s-70s  . Rectal cancer Mother   . Diabetes Brother   . Colon polyps Brother   . Diabetes Brother   . Colon polyps Brother   . Lymphoma Maternal Aunt   . Heart disease Maternal Uncle   . Crohn's disease Daughter   . Esophageal cancer Neg Hx   . Stomach cancer Neg Hx     The patient has two daughters who are cancer free.  She has two brothers who are cancer free, but reportedly have colon polyps.  Her father is deceased and her mother is living.  The patient's father has a few other children who are cancer free.  Her father died at 49 and had been diagnosed with colon cancer.  She did not grow up with her father so she is unfamiliar with his family history.  The patient's mother had colon cancer in her late 76's-70's.  She had one brother and one sister.  The sister had lymphoma.  The maternal grandparents are deceased.  The grandfather had colon cancer.  Aimee Mcclain is unaware  of previous family history of genetic testing for hereditary cancer risks. Patient's maternal ancestors are of Caucasian descent, and paternal ancestors are of Caucasian descent. There is no reported Ashkenazi Jewish ancestry. There is no known consanguinity.  GENETIC TEST RESULTS: Genetic testing reported out on January 12, 2019 through the Multi-cancer panel found no deleterious mutations.  The Multi-Gene Panel offered by [facility] includes sequencing and/or deletion duplication testing of the following 84 genes: AIP, ALK, APC, ATM, AXIN2,BAP1,  BARD1, BLM, BMPR1A, BRCA1, BRCA2, BRIP1, CASR, CDC73, CDH1, CDK4, CDKN1B, CDKN1C, CDKN2A (p14ARF), CDKN2A (p16INK4a), CEBPA, CHEK2, CTNNA1, DICER1, DIS3L2, EGFR (c.2369C>T, p.Thr790Met variant only), EPCAM (Deletion/duplication testing only), FH, FLCN, GATA2, GPC3, GREM1 (Promoter region deletion/duplication testing only), HOXB13 (c.251G>A, p.Gly84Glu), HRAS, KIT, MAX, MEN1, MET, MITF (c.952G>A, p.Glu318Lys variant only), MLH1, MSH2, MSH3, MSH6, MUTYH, NBN, NF1, NF2, NTHL1, PALB2, PDGFRA, PHOX2B, PMS2, POLD1, POLE, POT1, PRKAR1A, PTCH1, PTEN, RAD50, RAD51C, RAD51D, RB1, RECQL4, RET, RUNX1, SDHAF2, SDHA (sequence changes only), SDHB, SDHC, SDHD, SMAD4, SMARCA4, SMARCB1, SMARCE1, STK11, SUFU, TERC, TERT, TMEM127, TP53, TSC1, TSC2, VHL, WRN and WT1.   The test report has been scanned into EPIC and is located under the Molecular Pathology section of the Results Review tab.    We discussed with Aimee Mcclain that since the current genetic testing is not perfect, it is possible there may  be a gene mutation in one of these genes that current testing cannot detect, but that chance is small.  We also discussed, that it is possible that another gene that has not yet been discovered, or that we have not yet tested, is responsible for the cancer diagnoses in the family, and it is, therefore, important to remain in touch with cancer genetics in the future so that we can continue to  offer Aimee Mcclain the most up to date genetic testing.    CANCER SCREENING RECOMMENDATIONS: This normal result is reassuring and indicates that Aimee Mcclain does not likely have an increased risk of cancer due to a mutation in one of these genes.  We, therefore, recommended  Aimee Mcclain continue to follow the cancer screening guidelines provided by her primary healthcare providers.   An individual's cancer risk and medical management are not determined by genetic test results alone. Overall cancer risk assessment incorporates additional factors, including personal medical history, family history, and any available genetic information that may result in a personalized plan for cancer prevention and surveillance.  RECOMMENDATIONS FOR FAMILY MEMBERS:  Women in this family might be at some increased risk of developing cancer, over the general population risk, simply due to the family history of cancer.  We recommended women in this family have a yearly mammogram beginning at age 34, or 57 years younger than the earliest onset of cancer, an annual clinical breast exam, and perform monthly breast self-exams. Women in this family should also have a gynecological exam as recommended by their primary provider. All family members should have a colonoscopy by age 30.  FOLLOW-UP: Lastly, we discussed with Aimee Mcclain that cancer genetics is a rapidly advancing field and it is possible that new genetic tests will be appropriate for her and/or her family members in the future. We encouraged her to remain in contact with cancer genetics on an annual basis so we can update her personal and family histories and let her know of advances in cancer genetics that may benefit this family.   Our contact number was provided. Aimee Mcclain's questions were answered to her satisfaction, and she knows she is welcome to call us at anytime with additional questions or concerns.   Roma Kayser, MS, [facility] Certified Genetic  Counselor [email or web address]

## 2019-01-16 NOTE — Telephone Encounter (Signed)
Revealed negative genetic testing.  Discussed that we do not know why she has colon polyps or why there is cancer in the family. It could be due to a different gene that we are not testing, or maybe our current technology may not be able to pick something up.  It will be important for her to keep in contact with genetics to keep up with whether additional testing may be needed.  

## 2019-03-01 DIAGNOSIS — J029 Acute pharyngitis, unspecified: Secondary | ICD-10-CM | POA: Diagnosis not present

## 2019-03-01 DIAGNOSIS — R05 Cough: Secondary | ICD-10-CM | POA: Diagnosis not present

## 2019-03-01 DIAGNOSIS — J040 Acute laryngitis: Secondary | ICD-10-CM | POA: Diagnosis not present

## 2019-03-01 DIAGNOSIS — J018 Other acute sinusitis: Secondary | ICD-10-CM | POA: Diagnosis not present

## 2019-03-10 DIAGNOSIS — R05 Cough: Secondary | ICD-10-CM | POA: Diagnosis not present

## 2019-03-10 DIAGNOSIS — R51 Headache: Secondary | ICD-10-CM | POA: Diagnosis not present

## 2019-03-10 DIAGNOSIS — E559 Vitamin D deficiency, unspecified: Secondary | ICD-10-CM | POA: Diagnosis not present

## 2019-03-10 DIAGNOSIS — R300 Dysuria: Secondary | ICD-10-CM | POA: Diagnosis not present

## 2019-03-10 DIAGNOSIS — J040 Acute laryngitis: Secondary | ICD-10-CM | POA: Diagnosis not present

## 2019-03-10 DIAGNOSIS — R5383 Other fatigue: Secondary | ICD-10-CM | POA: Diagnosis not present

## 2019-03-10 DIAGNOSIS — Z79899 Other long term (current) drug therapy: Secondary | ICD-10-CM | POA: Diagnosis not present

## 2019-03-10 DIAGNOSIS — E7800 Pure hypercholesterolemia, unspecified: Secondary | ICD-10-CM | POA: Diagnosis not present

## 2019-06-12 DIAGNOSIS — Z1231 Encounter for screening mammogram for malignant neoplasm of breast: Secondary | ICD-10-CM | POA: Diagnosis not present

## 2019-06-12 DIAGNOSIS — Z01419 Encounter for gynecological examination (general) (routine) without abnormal findings: Secondary | ICD-10-CM | POA: Diagnosis not present

## 2019-06-12 DIAGNOSIS — M8580 Other specified disorders of bone density and structure, unspecified site: Secondary | ICD-10-CM | POA: Diagnosis not present

## 2019-06-12 DIAGNOSIS — L900 Lichen sclerosus et atrophicus: Secondary | ICD-10-CM | POA: Diagnosis not present

## 2019-06-12 LAB — HM DEXA SCAN

## 2019-06-12 LAB — HM MAMMOGRAPHY

## 2019-10-22 NOTE — Progress Notes (Signed)
+    10/22/2019 Doyce Loose Buchheit [id number] 1956-01-06   History of Present Illness: Aimee Mcclain is a 63 year old female with a past medical history of GERD and colon polyps. 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 Her most recent colonoscopy was 10/10/2018 with Dr. Ardis Hughs which identified erythematous mucosa throughout the colon, several scattered erosions throughout the colon (right colon > left)  and a 44mm rectal hyperplastic polyp was removed. The colon biopsies showed mildly active  nonspecific colitis, ? NSAID use.  A repeat colonoscopy in 5 years and a referral for genetic testing due to strong family history of colorectal cancer was advised. Maternal GF, father, mother had colon cancer. Two brothers with colon polyps. Daughter with Crohn's disease.  She presents today with complaints of worsening reflux symptoms.  She describes having constant heartburn that sometimes radiates to the right chest area.  She often has a sore throat.  She has noticed voice hoarseness.  She complains of increased burping.  Her reflux symptoms have worsened over the past year.  She sometimes feels it is difficult to swallow, it takes more effort to swallow which occurs once every month or 2.  Food does not get stuck in the throat or esophagus.  She underwent an EGD in 2014 by Dr. Amedeo Plenty which identified significant esophageal reflux.  She takes Tums as needed with brief relief.  Nexium and Prilosec resulted in significant headaches.  She has not tried Pepcid.  She has infrequent lower abdominal cramping.  She is passing a normal formed brown bowel movement most days.  She reports having an external hemorrhoid that bleeds frequently.  She describes seeing drops of red blood in the toilet water and on the toilet tissue which is not a new issue.  She takes Aleve 2 tabs once monthly for headaches.  She also feels as if something is grabbing under her left rib cage area, described as a  pinching sensation which occurs especially if she leans forward.  She has gained 9 pounds over the past year.  No other complaints today.   Current Medications, Allergies, Past Medical History, Past Surgical History, Family History and Social History were reviewed in [employer] record.  partial hysterectomy   Physical Exam: There were no vitals taken for this visit. General: Well developed  63 year old female in no acute distress Head: Normocephalic and atraumatic Eyes:  sclerae anicteric, conjunctiva pink  Ears: Normal auditory acuity Mouth: Dentition intact. No ulcers or lesions.  Neck: supple, no lymphadenopathy or thyromegaly. Lungs: Clear throughout to auscultation Heart: Regular rate and rhythm Abdomen: Soft, non tender and non distended. No masses, no hepatomegaly. Normal bowel sounds  Rectal: Patient declined exam.  Musculoskeletal: Symmetrical with no gross deformities  Extremities: No edema  Neurological: Alert oriented x 4, grossly nonfocal Psychological:  Alert and cooperative. Normal mood and affect  Assessment and Recommendations:  1. GERD, poorly controlled. Infrequent dysphagia. She is intolerant to PPI secondary to headaches on Prilosec and Nexium when used in the past.  -EGD benefits and risks discussed including risk with sedation, bleeding and perforation -Famotidine 20mg  bid -Gaviscon 1 tbsp tid PRN -CMP, CBC  2. LUQ pain, superficial twinge under left rib cage -patient to monitor pain, if persists or worsens I will order an abdominal/pelvic CT -advised the patient to lose 10lbs, monitor if LUQ pain abates if she loses weight  3. Intermittent lower abdominal discomfort , colonoscopy 09/2018 showed nonspecific  colitis possibly related to NSAIDs -Continue to monitor symptoms  4. External hemorrhoids -Desitin apply a small amount  -CBC -If external hemorrhoidal bleeding worsens consult with rectal surgeon advised

## 2019-10-24 ENCOUNTER — Other Ambulatory Visit (INDEPENDENT_AMBULATORY_CARE_PROVIDER_SITE_OTHER): Payer: BC Managed Care – PPO

## 2019-10-24 ENCOUNTER — Ambulatory Visit (INDEPENDENT_AMBULATORY_CARE_PROVIDER_SITE_OTHER): Payer: BC Managed Care – PPO | Admitting: Nurse Practitioner

## 2019-10-24 VITALS — BP 128/70 | HR 73 | Temp 97.8°F | Ht 67.0 in | Wt 193.0 lb

## 2019-10-24 DIAGNOSIS — K625 Hemorrhage of anus and rectum: Secondary | ICD-10-CM

## 2019-10-24 DIAGNOSIS — K219 Gastro-esophageal reflux disease without esophagitis: Secondary | ICD-10-CM

## 2019-10-24 DIAGNOSIS — K644 Residual hemorrhoidal skin tags: Secondary | ICD-10-CM

## 2019-10-24 DIAGNOSIS — Z1159 Encounter for screening for other viral diseases: Secondary | ICD-10-CM

## 2019-10-24 DIAGNOSIS — R1012 Left upper quadrant pain: Secondary | ICD-10-CM | POA: Diagnosis not present

## 2019-10-24 LAB — CBC WITH DIFFERENTIAL/PLATELET
Basophils Absolute: 0.1 10*3/uL (ref 0.0–0.1)
Basophils Relative: 0.9 % (ref 0.0–3.0)
Eosinophils Absolute: 0.1 10*3/uL (ref 0.0–0.7)
Eosinophils Relative: 1.5 % (ref 0.0–5.0)
HCT: 44.1 % (ref 36.0–46.0)
Hemoglobin: 14.7 g/dL (ref 12.0–15.0)
Lymphocytes Relative: 38.2 % (ref 12.0–46.0)
Lymphs Abs: 2.1 10*3/uL (ref 0.7–4.0)
MCHC: 33.5 g/dL (ref 30.0–36.0)
MCV: 90.5 fl (ref 78.0–100.0)
Monocytes Absolute: 0.5 10*3/uL (ref 0.1–1.0)
Monocytes Relative: 8.7 % (ref 3.0–12.0)
Neutro Abs: 2.8 10*3/uL (ref 1.4–7.7)
Neutrophils Relative %: 50.7 % (ref 43.0–77.0)
Platelets: 205 10*3/uL (ref 150.0–400.0)
RBC: 4.87 Mil/uL (ref 3.87–5.11)
RDW: 13.3 % (ref 11.5–15.5)
WBC: 5.4 10*3/uL (ref 4.0–10.5)

## 2019-10-24 LAB — COMPREHENSIVE METABOLIC PANEL
ALT: 40 U/L — ABNORMAL HIGH (ref 0–35)
AST: 32 U/L (ref 0–37)
Albumin: 4.7 g/dL (ref 3.5–5.2)
Alkaline Phosphatase: 73 U/L (ref 39–117)
BUN: 14 mg/dL (ref 6–23)
CO2: 29 mEq/L (ref 19–32)
Calcium: 9.6 mg/dL (ref 8.4–10.5)
Chloride: 105 mEq/L (ref 96–112)
Creatinine, Ser: 0.74 mg/dL (ref 0.40–1.20)
GFR: 79.16 mL/min (ref >60.00)
Glucose, Bld: 94 mg/dL (ref 70–99)
Potassium: 4.2 mEq/L (ref 3.5–5.1)
Sodium: 140 mEq/L (ref 135–145)
Total Bilirubin: 0.5 mg/dL (ref 0.2–1.2)
Total Protein: 7.2 g/dL (ref 6.0–8.3)

## 2019-10-24 LAB — HIGH SENSITIVITY CRP: CRP, High Sensitivity: 0.44 mg/L (ref 0.000–5.000)

## 2019-10-24 MED ORDER — FAMOTIDINE 20 MG PO TABS: 20.0000 mg | ORAL_TABLET | Freq: Two times a day (BID) | ORAL | 6 refills | Status: DC

## 2019-10-24 NOTE — Patient Instructions (Addendum)
Your provider has requested that you go to the basement level for lab work before leaving today. Press "B" on the elevator. The lab is located at the first door on the left as you exit the elevator.  We have sent the following medications to your pharmacy for you to pick up at your convenience:  Famotidine.  You may take OTC Gaviscon - 1 tablespoon three times a day as needed.  You may use Desitin for hemorrhoid care.  Please follow up with Dr. Ardis Hughs on 09/29/2019 at 10:30am

## 2019-10-25 NOTE — Progress Notes (Signed)
I agree with the above note, plan 

## 2019-10-27 ENCOUNTER — Encounter: Payer: Self-pay | Admitting: Gastroenterology

## 2019-10-27 ENCOUNTER — Ambulatory Visit (INDEPENDENT_AMBULATORY_CARE_PROVIDER_SITE_OTHER): Payer: BC Managed Care – PPO

## 2019-10-27 ENCOUNTER — Other Ambulatory Visit (INDEPENDENT_AMBULATORY_CARE_PROVIDER_SITE_OTHER): Payer: Self-pay | Admitting: *Deleted

## 2019-10-27 DIAGNOSIS — R7401 Elevation of levels of liver transaminase levels: Secondary | ICD-10-CM

## 2019-10-27 DIAGNOSIS — Z1159 Encounter for screening for other viral diseases: Secondary | ICD-10-CM | POA: Diagnosis not present

## 2019-10-30 LAB — SARS CORONAVIRUS 2 (TAT 6-24 HRS): SARS Coronavirus 2: NEGATIVE

## 2019-10-31 ENCOUNTER — Ambulatory Visit (AMBULATORY_SURGERY_CENTER): Payer: BC Managed Care – PPO | Admitting: Gastroenterology

## 2019-10-31 ENCOUNTER — Encounter: Payer: Self-pay | Admitting: Gastroenterology

## 2019-10-31 ENCOUNTER — Other Ambulatory Visit: Payer: Self-pay

## 2019-10-31 VITALS — BP 139/84 | HR 69 | Temp 98.4°F | Resp 18 | Ht 67.0 in | Wt 193.0 lb

## 2019-10-31 DIAGNOSIS — K219 Gastro-esophageal reflux disease without esophagitis: Secondary | ICD-10-CM

## 2019-10-31 DIAGNOSIS — R1310 Dysphagia, unspecified: Secondary | ICD-10-CM | POA: Diagnosis not present

## 2019-10-31 DIAGNOSIS — R12 Heartburn: Secondary | ICD-10-CM | POA: Diagnosis not present

## 2019-10-31 MED ORDER — SUCRALFATE 1 GM/10ML PO SUSP: 1.0000 g | Freq: Two times a day (BID) | ORAL | 11 refills | Status: DC

## 2019-10-31 MED ORDER — SODIUM CHLORIDE 0.9 % IV SOLN: 500.0000 mL | Freq: Once | INTRAVENOUS | Status: DC

## 2019-10-31 NOTE — Op Note (Signed)
[facility] Patient Name: Aimee Mcclain Procedure Date: 10/31/2019 9:03 AM MRN: [id number] Endoscopist: Milus Banister , MD Age: 63 Referring MD:  Date of Birth: 01-Feb-1956 Gender: Female Account #: [id number] Procedure:                Upper GI endoscopy Indications:              Dysphagia, Heartburn Medicines:                Monitored Anesthesia Care Procedure:                Pre-Anesthesia Assessment:                           - Prior to the procedure, a History and Physical                            was performed, and patient medications and                            allergies were reviewed. The patient's tolerance of                            previous anesthesia was also reviewed. The risks                            and benefits of the procedure and the sedation                            options and risks were discussed with the patient.                            All questions were answered, and informed consent                            was obtained. Prior Anticoagulants: The patient has                            taken no previous anticoagulant or antiplatelet                            agents. ASA Grade Assessment: II - A patient with                            mild systemic disease. After reviewing the risks                            and benefits, the patient was deemed in                            satisfactory condition to undergo the procedure.                           After obtaining informed consent, the endoscope was  passed under direct vision. Throughout the                            procedure, the patient's blood pressure, pulse, and                            oxygen saturations were monitored continuously. The                            Endoscope was introduced through the mouth, and                            advanced to the second part of duodenum. The upper                            GI endoscopy was accomplished  without difficulty.                            The patient tolerated the procedure well. Scope In: Scope Out: Findings:                 The esophagus was normal (GE junction at 39cm from                            incisors).                           The stomach was normal.                           The examined duodenum was normal. Complications:            No immediate complications. Estimated blood loss:                            None. Estimated Blood Loss:     Estimated blood loss: none. Impression:               - Normal UGI tract. Recommendation:           - Patient has a contact number available for                            emergencies. The signs and symptoms of potential                            delayed complications were discussed with the                            patient. Return to normal activities tomorrow.                            Written discharge instructions were provided to the                            patient.                           -  Resume previous diet.                           - Continue present medications. Please change your                            pepcid dosing so that you take ONLY 20mg  pill at                            bedtime every night. New script for carafate 1gm                            suspension, BID (with breakfast and dinner meals),                            disp 30 days, 11 refills. Call to report on your                            response in 5-6 weeks. Milus Banister, MD 10/31/2019 9:24:28 AM This report has been signed electronically.

## 2019-10-31 NOTE — Patient Instructions (Addendum)
Take your pepcid and take 1 20 mg pill at bedtime.  Carafate was ordered for you as well.  Take it as directed.  Thank-you for choosing [facility] for your healthcare needs today.  YOU HAD AN ENDOSCOPIC PROCEDURE TODAY AT [facility]:   Refer to the procedure report that was given to you for any specific questions about what was found during the examination.  If the procedure report does not answer your questions, please call your gastroenterologist to clarify.  If you requested that your care partner not be given the details of your procedure findings, then the procedure report has been included in a sealed envelope for you to review at your convenience later.  YOU SHOULD EXPECT: Some feelings of bloating in the abdomen. Passage of more gas than usual.  Walking can help get rid of the air that was put into your GI tract during the procedure and reduce the bloating.  Please Note:  You might notice some irritation and congestion in your nose or some drainage.  This is from the oxygen used during your procedure.  There is no need for concern and it should clear up in a day or so.  SYMPTOMS TO REPORT IMMEDIATELY:   Following upper endoscopy (EGD)  Vomiting of blood or coffee ground material  New chest pain or pain under the shoulder blades  Painful or persistently difficult swallowing  New shortness of breath  Fever of 100F or higher  Black, tarry-looking stools  For urgent or emergent issues, a gastroenterologist can be reached at any hour by calling [phone].   DIET:  We do recommend a small meal at first, but then you may proceed to your regular diet.  Drink plenty of fluids but you should avoid alcoholic beverages for 24 hours.  ACTIVITY:  You should plan to take it easy for the rest of today and you should NOT DRIVE or use heavy machinery until tomorrow (because of the sedation medicines used during the test).    FOLLOW UP: Our staff will call the number listed on your  records 48-72 hours following your procedure to check on you and address any questions or concerns that you may have regarding the information given to you following your procedure. If we do not reach you, we will leave a message.  We will attempt to reach you two times.  During this call, we will ask if you have developed any symptoms of COVID 19. If you develop any symptoms (ie: fever, flu-like symptoms, shortness of breath, cough etc.) before then, please call [phone].  If you test positive for Covid 19 in the 2 weeks post procedure, please call and report this information to [place].      SIGNATURES/CONFIDENTIALITY: You and/or your care partner have signed paperwork which will be entered into your electronic medical record.  These signatures attest to the fact that that the information above on your After Visit Summary has been reviewed and is understood.  Full responsibility of the confidentiality of this discharge information lies with you and/or your care-partner.

## 2019-10-31 NOTE — Progress Notes (Signed)
PT taken to PACU. Monitors in place. VSS. Report given to RN. 

## 2019-10-31 NOTE — Progress Notes (Signed)
Pt's states no medical or surgical changes since previsit or office visit. 

## 2019-11-02 ENCOUNTER — Telehealth: Payer: Self-pay

## 2019-11-02 NOTE — Telephone Encounter (Signed)
  Follow up Call-  Call back number 10/31/2019 10/10/2018  Post procedure Call Back phone  # [phone]  Permission to leave phone message Yes Yes  Some recent data might be hidden     Patient questions:  Do you have a fever, pain , or abdominal swelling? No. Pain Score  0 *  Have you tolerated food without any problems? Yes.    Have you been able to return to your normal activities? Yes.    Do you have any questions about your discharge instructions: Diet   No. Medications  No. Follow up visit  No.  Do you have questions or concerns about your Care? No.  Actions: * If pain score is 4 or above: No action needed, pain <4.  1. Have you developed a fever since your procedure? No  2.   Have you had an respiratory symptoms (SOB or cough) since your procedure? No 3.   Have you tested positive for COVID 19 since your procedure No  4.   Have you had any family members/close contacts diagnosed with the COVID 19 since your procedure?  No   If yes to any of these questions please route to Joylene John, RN and Alphonsa Gin, RN.

## 2019-11-29 ENCOUNTER — Encounter: Payer: Self-pay | Admitting: Gastroenterology

## 2019-11-29 ENCOUNTER — Other Ambulatory Visit: Payer: Self-pay

## 2019-11-29 ENCOUNTER — Ambulatory Visit (INDEPENDENT_AMBULATORY_CARE_PROVIDER_SITE_OTHER): Payer: BC Managed Care – PPO | Admitting: Gastroenterology

## 2019-11-29 DIAGNOSIS — K219 Gastro-esophageal reflux disease without esophagitis: Secondary | ICD-10-CM

## 2019-11-29 NOTE — Progress Notes (Signed)
Review of pertinent gastrointestinal problems: 1. Elevated risk for colon cancer, Mother had colon cancer in her 62s. Father also had colon cancer. Colonoscopy 2014 no polyps.  Colonoscopy 09/2018 Dr. Ardis Hughs found subCM hyperplastic polyp and recommended recall at 5 years. 2. Abnormal colon mucosa, colitis:  Colonoscopy 2014 Dr. Amedeo Plenty patchy area of mildly erythematous and granular mucosa found in the ascending colon. Pathology from the colon showed "chronic, mildly active colitis, consistent with inflammatory bowel disease"  Colonoscopy 2019 Dr. Ardis Hughs found several small scattered erosions (right colon>left colon). Path showed mild active inflammation; ddx per pathologist includes infection, NSAIDs.  2019; Does not take NSAIDs, never loose stools or bleeding. 3. GERD: EGD, Dr. Amedeo Plenty January 2014indication "esophageal reflux, failure to respond to medical treatment". Findings esophagus was normal. There is some mild gastritis in the gastric antrum which was biopsied. The duodenum was normal. The biopsies from the stomach showed "benign gastric mucosa. No H. pylori dysplasia or malignancy". There is written instructions from Dr. Amedeo Plenty indicating that he increased her proton pump inhibitor to twice daily.  EGD November 2020 indication, GERD heartburn.  Findings completely normal upper GI tract.  I recommended that she take Pepcid 20 mg at bedtime every night and Carafate twice daily with breakfast and dinner meals.  She was intolerant of proton pump inhibitors due to significant headaches.    This service was provided via virtual visit.   We attempted audio and visual however the Zoom application did not function perfectly on her and and so only audio was used.  The patient was located at home.  I was located in my office.  The patient did consent to this virtual visit and is aware of possible charges through their insurance for this visit.  The patient is an established patient.  My certified medical  assistant, Grace Bushy, contributed to this visit by contacting the patient by phone 1 or 2 business days prior to the appointment and also followed up on the recommendations I made after the visit.  Time spent on virtual visit: 22 minutes   HPI: This is a very pleasant 63 year old woman who I last saw at the time of her EGD.  See those results summarized above  Stopped pepcid and her headaches stopped.  Indigestion is not as severe.  BID carafate before breakfast and dinner.  Coughing,  Burning in her throat.  Eats more to calm it.  Avoids cafinated    Chief complaint is GERD  ROS: complete GI ROS as described in HPI, all other review negative.  Constitutional:  No unintentional weight loss   Past Medical History:  Diagnosis Date  . Cataract   . Colon polyps   . Endometriosis   . Family history of colon cancer   . Family history of colonic polyps   . H/O bilateral breast implants   . History of colon polyps   . HPV in female   . HSV (herpes simplex virus) infection   . S/P TAH (total abdominal hysterectomy) 1991    Past Surgical History:  Procedure Laterality Date  . BREAST ENHANCEMENT SURGERY    . PARTIAL HYSTERECTOMY    . TONSILLECTOMY    . WISDOM TOOTH EXTRACTION      Current Outpatient Medications  Medication Sig Dispense Refill  . estradiol (VIVELLE-DOT) 0.05 MG/24HR patch Place 0.05 mg onto the skin once a week.    . famotidine (PEPCID) 20 MG tablet Take 1 tablet (20 mg total) by mouth 2 (two) times daily. 60 tablet  6  . sucralfate (CARAFATE) 1 GM/10ML suspension Take 10 mLs (1 g total) by mouth 2 (two) times daily. 600 mL 11  . valACYclovir (VALTREX) 500 MG tablet Take 1 tablet (500 mg total) by mouth 2 (two) times daily. 30 tablet 11   No current facility-administered medications for this visit.    Allergies as of 11/29/2019 - Review Complete 10/31/2019  Allergen Reaction Noted  . Ibuprofen Swelling 05/23/2012  . Vioxx [rofecoxib] Swelling and Rash  05/23/2012    Family History  Problem Relation Age of Onset  . Dementia Maternal Grandmother   . Heart disease Maternal Grandfather   . Colon cancer Maternal Grandfather        dx over 107  . Colon cancer Father        d. 38  . Heart disease Mother   . Colon cancer Mother        dx late 23s-70s  . Rectal cancer Mother   . Diabetes Brother   . Colon polyps Brother   . Diabetes Brother   . Colon polyps Brother   . Lymphoma Maternal Aunt   . Heart disease Maternal Uncle   . Crohn's disease Daughter   . Esophageal cancer Neg Hx   . Stomach cancer Neg Hx     Social History   Socioeconomic History  . Marital status: Married    Spouse name: Not on file  . Number of children: Not on file  . Years of education: Not on file  . Highest education level: Not on file  Occupational History  . Not on file  Tobacco Use  . Smoking status: Never Smoker  . Smokeless tobacco: Never Used  Substance and Sexual Activity  . Alcohol use: Yes    Comment: occasional  . Drug use: No  . Sexual activity: Yes    Comment: hyst  Other Topics Concern  . Not on file  Social History Narrative  . Not on file   Social Determinants of Health   Financial Resource Strain:   . Difficulty of Paying Living Expenses: Not on file  Food Insecurity:   . Worried About Charity fundraiser in the Last Year: Not on file  . Ran Out of Food in the Last Year: Not on file  Transportation Needs:   . Lack of Transportation (Medical): Not on file  . Lack of Transportation (Non-Medical): Not on file  Physical Activity:   . Days of Exercise per Week: Not on file  . Minutes of Exercise per Session: Not on file  Stress:   . Feeling of Stress : Not on file  Social Connections:   . Frequency of Communication with Friends and Family: Not on file  . Frequency of Social Gatherings with Friends and Family: Not on file  . Attends Religious Services: Not on file  . Active Member of Clubs or Organizations: Not on file   . Attends Archivist Meetings: Not on file  . Marital Status: Not on file  Intimate Partner Violence:   . Fear of Current or Ex-Partner: Not on file  . Emotionally Abused: Not on file  . Physically Abused: Not on file  . Sexually Abused: Not on file     Physical Exam: Unable to perform because this was a "telemed visit" due to current Covid-19 pandemic  Assessment and plan: 63 y.o. female with GERD  She has GERD without alarm symptoms.  No overt esophagitis on EGD last month.  She is intolerant to both  proton pump inhibitors and H2 blockers due to significant headaches.  I offered referral to Dr. Bryan Lemma to consider TIF procedure however she is fairly content right now on Carafate which she takes twice daily as well as some Gaviscon periodically.  She said on this regimen her GERD symptoms seem to be under fairly good control.  She will call if she changes her mind about that referral however  Please see the "Patient Instructions" section for addition details about the plan.  Owens Loffler, MD [facility] 11/29/2019, 10:53 AM

## 2019-11-29 NOTE — Patient Instructions (Addendum)
She is going to call if she is interested in referral for TIF procedure with Dr. Bryan Lemma.  Otherwise she will continue taking the Carafate twice daily and will call if she needs refills.

## 2020-01-08 ENCOUNTER — Other Ambulatory Visit (INDEPENDENT_AMBULATORY_CARE_PROVIDER_SITE_OTHER): Payer: Self-pay | Admitting: *Deleted

## 2020-01-08 ENCOUNTER — Other Ambulatory Visit: Payer: Self-pay

## 2020-01-08 DIAGNOSIS — R7401 Elevation of levels of liver transaminase levels: Secondary | ICD-10-CM

## 2020-01-31 ENCOUNTER — Telehealth: Payer: Self-pay | Admitting: Internal Medicine

## 2020-01-31 NOTE — Telephone Encounter (Signed)
Patient called today about scheduling a new patient appointment., She stated that [facility] was sending over her information to our office so she could be scheduled. Her husband is a patient of yours and she would like to see you  Do you know anything about the records? And are you okay taking her on as a new patient?

## 2020-01-31 NOTE — Telephone Encounter (Signed)
Okay with me.  30-minute office visit when possible.  Please request the last 2 years of records from her previous PCP.  Thanks.

## 2020-02-12 ENCOUNTER — Ambulatory Visit (INDEPENDENT_AMBULATORY_CARE_PROVIDER_SITE_OTHER): Payer: BC Managed Care – PPO | Admitting: Family Medicine

## 2020-02-12 ENCOUNTER — Other Ambulatory Visit: Payer: Self-pay

## 2020-02-12 ENCOUNTER — Encounter: Payer: Self-pay | Admitting: Family Medicine

## 2020-02-12 ENCOUNTER — Ambulatory Visit (INDEPENDENT_AMBULATORY_CARE_PROVIDER_SITE_OTHER)
Admission: RE | Admit: 2020-02-12 | Discharge: 2020-02-12 | Disposition: A | Discharge status: Home / Self Care | Payer: BC Managed Care – PPO | Source: Ambulatory Visit | Attending: Family Medicine | Admitting: Family Medicine

## 2020-02-12 VITALS — BP 124/84 | HR 74 | Temp 97.1°F | Ht 67.0 in | Wt 180.1 lb

## 2020-02-12 DIAGNOSIS — G629 Polyneuropathy, unspecified: Secondary | ICD-10-CM | POA: Diagnosis not present

## 2020-02-12 DIAGNOSIS — R0789 Other chest pain: Secondary | ICD-10-CM

## 2020-02-12 DIAGNOSIS — Z0000 Encounter for general adult medical examination without abnormal findings: Secondary | ICD-10-CM

## 2020-02-12 DIAGNOSIS — G43909 Migraine, unspecified, not intractable, without status migrainosus: Secondary | ICD-10-CM

## 2020-02-12 DIAGNOSIS — Z7189 Other specified counseling: Secondary | ICD-10-CM | POA: Diagnosis not present

## 2020-02-12 MED ORDER — VALACYCLOVIR HCL 500 MG PO TABS: 500.0000 mg | ORAL_TABLET | Freq: Two times a day (BID) | ORAL | Status: AC | PRN

## 2020-02-12 NOTE — Progress Notes (Signed)
This visit occurred during the SARS-CoV-2 public health emergency.  Safety protocols were in place, including screening questions prior to the visit, additional usage of staff PPE, and extensive cleaning of exam room while observing appropriate contact time as indicated for disinfecting solutions.  New patient to establish care.  Requesting records from outside MDs.  Several issues to consider.  She feels a "bubbling sensation" in her feet bilaterally on the plantar side of the feet.  Going on about 1 year.  It is a clear change in sensation but without pain.  No hand symptoms.  No palpable mass noted by patient.  Her mother does has a history of neuropathy.  She has had some intermittent chest heaviness without exertional symptoms for the past year.  No clear trigger.  She does have a history of GERD cough.  No wheeze.  No cough otherwise.  No fevers, chills, nausea vomiting or diarrhea.  Health maintenance discussed with patient.  Diet and exercise discussed with patient. Requesting old records about her tetanus shot. Flu shot done 2020. Pneumonia shot due at 33  Shingles shot discussed with patient Covid vaccination discussed with patient. Pap/bone density/mammogram per gynecologist.  Requesting records. Colonoscopy 2019. Advance directive discussed with patient.  Husband designated if patient were incapacitated. She reports hepatitis screening with previous PCP. Intentional weight loss this year with diet and exercise.  She has used valtrex prn, not daily.    History of migraines.  Much less severe now.  She has used Aleve occasionally.  She tolerates that medication and it has good effect for her.  No adverse effect on medication.  PMH and SH reviewed  ROS: Per HPI unless specifically indicated in ROS section   Meds, vitals, and allergies reviewed.   GEN: nad, alert and oriented HEENT: ncat NECK: supple w/o LA CV: rrr.  no murmur PULM: ctab, no inc wob ABD: soft, +bs EXT: no  edema SKIN: no acute rash She has intact sensation in the feet.  Cranial nerves II through XII intact bilaterally.  EKG within normal limits.  No acute changes.  Discussed with patient at office visit.

## 2020-02-12 NOTE — Patient Instructions (Signed)
Go to the lab on the way out.   If you have mychart we'll likely use that to update you.    We'll request your records in the meantime.  Take care.  Glad to see you.

## 2020-02-13 LAB — CBC WITH DIFFERENTIAL/PLATELET
Basophils Absolute: 0 10*3/uL (ref 0.0–0.1)
Basophils Relative: 0.6 % (ref 0.0–3.0)
Eosinophils Absolute: 0.1 10*3/uL (ref 0.0–0.7)
Eosinophils Relative: 1.4 % (ref 0.0–5.0)
HCT: 41.3 % (ref 36.0–46.0)
Hemoglobin: 13.9 g/dL (ref 12.0–15.0)
Lymphocytes Relative: 38.5 % (ref 12.0–46.0)
Lymphs Abs: 2.3 10*3/uL (ref 0.7–4.0)
MCHC: 33.7 g/dL (ref 30.0–36.0)
MCV: 89.4 fl (ref 78.0–100.0)
Monocytes Absolute: 0.5 10*3/uL (ref 0.1–1.0)
Monocytes Relative: 8.8 % (ref 3.0–12.0)
Neutro Abs: 3 10*3/uL (ref 1.4–7.7)
Neutrophils Relative %: 50.7 % (ref 43.0–77.0)
Platelets: 174 10*3/uL (ref 150.0–400.0)
RBC: 4.62 Mil/uL (ref 3.87–5.11)
RDW: 13 % (ref 11.5–15.5)
WBC: 5.9 10*3/uL (ref 4.0–10.5)

## 2020-02-13 LAB — LIPID PANEL
Cholesterol: 179 mg/dL (ref 0–200)
HDL: 51.2 mg/dL (ref >39.00)
LDL Cholesterol: 111 mg/dL — ABNORMAL HIGH (ref 0–99)
NonHDL: 128.13
Total CHOL/HDL Ratio: 4
Triglycerides: 87 mg/dL (ref 0.0–149.0)
VLDL: 17.4 mg/dL (ref 0.0–40.0)

## 2020-02-13 LAB — COMPREHENSIVE METABOLIC PANEL
ALT: 18 U/L (ref 0–35)
AST: 22 U/L (ref 0–37)
Albumin: 4.2 g/dL (ref 3.5–5.2)
Alkaline Phosphatase: 59 U/L (ref 39–117)
BUN: 16 mg/dL (ref 6–23)
CO2: 27 mEq/L (ref 19–32)
Calcium: 9.6 mg/dL (ref 8.4–10.5)
Chloride: 106 mEq/L (ref 96–112)
Creatinine, Ser: 0.88 mg/dL (ref 0.40–1.20)
GFR: 64.75 mL/min (ref >60.00)
Glucose, Bld: 93 mg/dL (ref 70–99)
Potassium: 4.1 mEq/L (ref 3.5–5.1)
Sodium: 142 mEq/L (ref 135–145)
Total Bilirubin: 0.4 mg/dL (ref 0.2–1.2)
Total Protein: 6.8 g/dL (ref 6.0–8.3)

## 2020-02-13 LAB — VITAMIN B12: Vitamin B-12: 344 pg/mL (ref 211–911)

## 2020-02-13 LAB — TSH: TSH: 1.76 u[IU]/mL (ref 0.35–4.50)

## 2020-02-14 ENCOUNTER — Other Ambulatory Visit: Payer: Self-pay | Admitting: Family Medicine

## 2020-02-14 ENCOUNTER — Encounter: Payer: Self-pay | Admitting: Family Medicine

## 2020-02-14 DIAGNOSIS — R0789 Other chest pain: Secondary | ICD-10-CM | POA: Insufficient documentation

## 2020-02-14 DIAGNOSIS — G43909 Migraine, unspecified, not intractable, without status migrainosus: Secondary | ICD-10-CM | POA: Insufficient documentation

## 2020-02-14 DIAGNOSIS — G629 Polyneuropathy, unspecified: Secondary | ICD-10-CM | POA: Insufficient documentation

## 2020-02-14 DIAGNOSIS — Z0000 Encounter for general adult medical examination without abnormal findings: Secondary | ICD-10-CM | POA: Insufficient documentation

## 2020-02-14 MED ORDER — OMEPRAZOLE 20 MG PO CPDR: 20.0000 mg | DELAYED_RELEASE_CAPSULE | Freq: Every day | ORAL | Status: DC

## 2020-02-14 MED ORDER — VITAMIN B-12 1000 MCG PO TABS: 1000.0000 ug | ORAL_TABLET | Freq: Every day | ORAL | Status: DC

## 2020-02-14 NOTE — Assessment & Plan Note (Addendum)
Advance directive discussed with patient.  Husband designated if patient were incapacitated. 

## 2020-02-14 NOTE — Assessment & Plan Note (Signed)
No weakness.  Differential diagnosis discussed with patient.  Reasonable check routine labs today.  See notes on labs.  At this point still okay for outpatient follow-up.  No emergent symptoms. At least 30 minutes were devoted to patient care in this encounter (this can potentially include time spent reviewing the patient's file/history, interviewing and examining the patient, counseling/reviewing plan with patient, ordering referrals, ordering tests, reviewing relevant laboratory or x-ray data, and documenting the encounter).

## 2020-02-14 NOTE — Assessment & Plan Note (Signed)
She has had some intermittent chest heaviness without exertional symptoms for the past year.  No clear trigger.  She does have a history of GERD cough.  No wheeze.  No cough otherwise.  No fevers, chills, nausea vomiting or diarrhea.  Reasonable to check chest x-ray.  This could be GERD related.  She does not have exertional symptoms.  See notes on labs.  Still okay for outpatient follow-up.

## 2020-02-14 NOTE — Assessment & Plan Note (Signed)
History of migraines.  Much less severe now.  She has used Aleve occasionally.  She tolerates that medication and it has good effect for her.  No adverse effect on medication.  She will update me as needed.

## 2020-02-14 NOTE — Assessment & Plan Note (Addendum)
Health maintenance discussed with patient.  Diet and exercise discussed with patient. Requesting old records about her tetanus shot. Flu shot done 2020. Pneumonia shot due at 53  Shingles shot discussed with patient Covid vaccination discussed with patient. Pap/bone density/mammogram per gynecologist.  Requesting records. Colonoscopy 2019. Advance directive discussed with patient.  Husband designated if patient were incapacitated. She reports hepatitis screening with previous PCP. Intentional weight loss this year with diet and exercise.

## 2020-02-16 ENCOUNTER — Encounter: Payer: Self-pay | Admitting: Family Medicine

## 2020-02-16 ENCOUNTER — Other Ambulatory Visit: Payer: Self-pay | Admitting: Family Medicine

## 2020-03-25 ENCOUNTER — Encounter: Payer: Self-pay | Admitting: Family Medicine

## 2020-03-25 LAB — LAB REPORT - SCANNED: Hep C Virus Ab: 0.07

## 2020-03-29 ENCOUNTER — Encounter: Payer: Self-pay | Admitting: Family Medicine

## 2020-05-03 ENCOUNTER — Encounter: Payer: Self-pay | Admitting: Family Medicine

## 2020-06-02 ENCOUNTER — Encounter: Payer: Self-pay | Admitting: Family Medicine

## 2020-06-04 ENCOUNTER — Telehealth: Payer: Self-pay | Admitting: Family Medicine

## 2020-06-04 NOTE — Telephone Encounter (Signed)
I think it makes sense to get her set up for an OV this week.  If new neuro sx in the meantime, then to ER.  Thanks.

## 2020-06-04 NOTE — Telephone Encounter (Signed)
Patient advised. Appointment scheduled.  

## 2020-06-04 NOTE — Telephone Encounter (Addendum)
I spoke with Aimee Mcclain and she has not had any more of the tingling on her face. Aimee Mcclain wonders if could be neuropathy. No other tingling anywhere else except face. No H/A on Fri and no H/A now. No dizziness,CP or SOB. No vision changes. No weakness noted in face or either side. No problem with speech and no drooping of either side of face. Aimee Mcclain feels fine now and does not think she needs appt unless symptoms were to return but request Dr Damita Dunnings to review note and see what he suggest. Aimee Mcclain was in [place] when symptoms occurred and did not have BP cuff. pts BP now is 153/93 P 87. Aimee Mcclain recked BP 146/91.  Aimee Mcclain does not take prescription meds. Aimee Mcclain request cb after Dr Damita Dunnings reviews note.

## 2020-06-04 NOTE — Telephone Encounter (Signed)
See my chart message pasted below.  Please triage patient about her symptoms.  Thanks.  ======================= Friday night I continuously was wakened by a tingling on my face.  Like something was crawling on my chin.  Then my cheek then my forehead. Thus happened all night.  One small spot then another then another.  When I got up it stopped.  The next night  it only happened a couple of times?  I feel fine.  Should I be concerned??

## 2020-06-06 ENCOUNTER — Encounter: Payer: Self-pay | Admitting: Family Medicine

## 2020-06-06 ENCOUNTER — Ambulatory Visit (INDEPENDENT_AMBULATORY_CARE_PROVIDER_SITE_OTHER): Payer: BC Managed Care – PPO | Admitting: Family Medicine

## 2020-06-06 ENCOUNTER — Other Ambulatory Visit: Payer: Self-pay

## 2020-06-06 VITALS — BP 142/72 | HR 85 | Temp 97.4°F | Ht 67.0 in | Wt 178.4 lb

## 2020-06-06 DIAGNOSIS — R202 Paresthesia of skin: Secondary | ICD-10-CM

## 2020-06-06 NOTE — Progress Notes (Signed)
This visit occurred during the SARS-CoV-2 public health emergency.  Safety protocols were in place, including screening questions prior to the visit, additional usage of staff PPE, and extensive cleaning of exam room while observing appropriate contact time as indicated for disinfecting solutions.  She went to sleep, then woke up with the feeling of something on her chin.  Then had altered sensation "like something was crawling on my face" but not numb or tingling.  Noted on the R vs L side of her face, in patches.  It clearly happened on each side of the face.  Unclear timeline between events but all of the episodes were in 1 night.  No visible skin changes.  She has similar sensation episodically the next day.  No sx in the meantime at night.  She'll rarely have a mild episodes in the day at this point.  Some fatigue from not sleeping well the first night of sx.  Prev facial sx were B in all V1-V3 distributions but not all the same time.  No facial droop.  No known double vision.  No weakness in ext.  Mild HA the next day after initial sx.  No vision loss.   No posterior scalp sx.    She was really active the day before she had initial sx at night.    H/o foot neuropathy "like I'm walking on air bubbles" with occ pain or her feel will feel hot.  She couldn't tell a benefit with taking b12.    Meds, vitals, and allergies reviewed.   ROS: Per HPI unless specifically indicated in ROS section   GEN: nad, alert and oriented HEENT: ncat NECK: supple w/o LA CV: rrr PULM: ctab, no inc wob ABD: soft, +bs EXT: no edema SKIN: no acute rash CN 2-12 wnl B, S/S/DTR wnl x4

## 2020-06-06 NOTE — Patient Instructions (Signed)
Since you have a normal neurologic exam now, I need to consider options.  It may be that we need to set you up with some imaging.  We'll be in touch.  Take care.  Glad to see you.

## 2020-06-07 ENCOUNTER — Ambulatory Visit: Payer: BC Managed Care – PPO | Admitting: Family Medicine

## 2020-06-09 ENCOUNTER — Telehealth: Payer: Self-pay | Admitting: Family Medicine

## 2020-06-09 DIAGNOSIS — R202 Paresthesia of skin: Secondary | ICD-10-CM | POA: Insufficient documentation

## 2020-06-09 NOTE — Telephone Encounter (Signed)
Please call patient.  I think it makes sense to see neurology before we send her for any extra imaging or labs.  It may be the case that neurology opts for MR instead of CT, after seeing the patient.  I want their input first.  If she consents I will go ahead with the referral.  Thanks.

## 2020-06-09 NOTE — Assessment & Plan Note (Signed)
Unclear source without symptoms at this point.  She has a normal neurologic exam now.  She has a history of neuropathy with previous unremarkable labs.  I told her I wanted to consider options.  See follow-up phone note.

## 2020-06-10 NOTE — Telephone Encounter (Signed)
Left message on patient's voicemail to return call

## 2020-06-10 NOTE — Telephone Encounter (Signed)
Patient advised and states she has had no other issues since and would like to think about it before getting the referral.  Patient will call if she wishes to set that up.

## 2020-06-11 NOTE — Telephone Encounter (Signed)
Noted.  I will await update from patient.

## 2020-06-13 DIAGNOSIS — N941 Unspecified dyspareunia: Secondary | ICD-10-CM | POA: Diagnosis not present

## 2020-06-13 DIAGNOSIS — N952 Postmenopausal atrophic vaginitis: Secondary | ICD-10-CM | POA: Diagnosis not present

## 2020-06-13 DIAGNOSIS — Z01419 Encounter for gynecological examination (general) (routine) without abnormal findings: Secondary | ICD-10-CM | POA: Diagnosis not present

## 2020-06-13 DIAGNOSIS — Z1231 Encounter for screening mammogram for malignant neoplasm of breast: Secondary | ICD-10-CM | POA: Diagnosis not present

## 2020-06-13 DIAGNOSIS — Z1211 Encounter for screening for malignant neoplasm of colon: Secondary | ICD-10-CM | POA: Diagnosis not present

## 2020-06-13 DIAGNOSIS — M8580 Other specified disorders of bone density and structure, unspecified site: Secondary | ICD-10-CM | POA: Diagnosis not present

## 2020-06-13 DIAGNOSIS — Z1239 Encounter for other screening for malignant neoplasm of breast: Secondary | ICD-10-CM | POA: Diagnosis not present

## 2020-06-27 ENCOUNTER — Encounter: Payer: Self-pay | Admitting: Family Medicine

## 2020-06-27 ENCOUNTER — Telehealth (INDEPENDENT_AMBULATORY_CARE_PROVIDER_SITE_OTHER): Payer: BC Managed Care – PPO | Admitting: Family Medicine

## 2020-06-27 ENCOUNTER — Other Ambulatory Visit: Payer: Self-pay

## 2020-06-27 VITALS — Temp 97.8°F

## 2020-06-27 DIAGNOSIS — Z1231 Encounter for screening mammogram for malignant neoplasm of breast: Secondary | ICD-10-CM

## 2020-06-27 DIAGNOSIS — R202 Paresthesia of skin: Secondary | ICD-10-CM | POA: Diagnosis not present

## 2020-06-27 DIAGNOSIS — J069 Acute upper respiratory infection, unspecified: Secondary | ICD-10-CM

## 2020-06-27 MED ORDER — DOXYCYCLINE HYCLATE 100 MG PO TABS: 100.0000 mg | ORAL_TABLET | Freq: Two times a day (BID) | ORAL | 0 refills | Status: DC

## 2020-06-27 NOTE — Progress Notes (Signed)
Interactive audio and video telecommunications were attempted between this provider and patient, however failed, due to patient having technical difficulties OR patient did not have access to video capability.  We continued and completed visit with audio only.   Virtual Visit via Telephone Note  I connected with patient on 06/27/20  at 10:34 AM  by telephone and verified that I am speaking with the correct person using two identifiers.  Location of patient: home.    Location of MD: [facility] Name of referring provider (if blank then none associated): Names per persons and role in encounter:  MD: Earlyne Iba, Patient: name listed above.    I discussed the limitations, risks, security and privacy concerns of performing an evaluation and management service by telephone and the availability of in person appointments. I also discussed with the patient that there may be a patient responsible charge related to this service. The patient expressed understanding and agreed to proceed.  CC: URI sx.    History of Present Illness:   Sx begain Monday 06-24-2020 with dizziness, headache, sore throat, swollen glands on right side of neck. She has no cough, no fever.  She has ear pressure and upper upper tooth pain.  Greenish nasal discharge.  Not SOB but has nasal congestion.  No wheeze.     She had covid vaccine.    She still has foot neuropathy sx "like I'm walking on air bubbles" but not scalp or facial numbness now.  We talked about neuro referral.  She has noted occ word searching vs slowing of recall.  She opted for neuro eval.  I put in the referral.    She has f/u imaging pending for abnormal mammogram.  She has seen Dr. Cletis Media about this- Dr. Cletis Media is managing this for patient.  I will await follow up reports.     Observations/Objective: nad Speech wnl.    Assessment and Plan: URI, presumed sinusitis.   She can start doxy, use flonase vs nasal saline.   Rest and fluids.   Taking  dayquil and nyquil.   Update me as needed.  She agrees.  She still has foot neuropathy sx "like I'm walking on air bubbles" but not scalp or facial numbness now.  We talked about neuro referral.  She has noted occ word searching vs slowing of recall.  She opted for neuro eval.  I put in the referral.    She has f/u imaging pending for abnormal mammogram.  She has seen Dr. Cletis Media about this- Dr. Cletis Media is managing this for patient.  I will await follow up reports.  Patient agrees.  Follow Up Instructions: see above.     I discussed the assessment and treatment plan with the patient. The patient was provided an opportunity to ask questions and all were answered. The patient agreed with the plan and demonstrated an understanding of the instructions.   The patient was advised to call back or seek an in-person evaluation if the symptoms worsen or if the condition fails to improve as anticipated.  I provided 19 minutes of non-face-to-face time during this encounter.  Elsie Stain, MD

## 2020-06-30 DIAGNOSIS — J069 Acute upper respiratory infection, unspecified: Secondary | ICD-10-CM | POA: Insufficient documentation

## 2020-06-30 DIAGNOSIS — Z1231 Encounter for screening mammogram for malignant neoplasm of breast: Secondary | ICD-10-CM | POA: Insufficient documentation

## 2020-06-30 NOTE — Assessment & Plan Note (Signed)
She has f/u imaging pending for abnormal mammogram.  She has seen Dr. Cletis Media about this- Dr. Cletis Media is managing this for patient.  I will await follow up reports.  Patient agrees.

## 2020-06-30 NOTE — Assessment & Plan Note (Signed)
   She still has foot neuropathy sx "like I'm walking on air bubbles" but not scalp or facial numbness now.  We talked about neuro referral.  She has noted occ word searching vs slowing of recall.  She opted for neuro eval.  I put in the referral.

## 2020-06-30 NOTE — Assessment & Plan Note (Signed)
URI, presumed sinusitis.   She can start doxy, use flonase vs nasal saline.   Rest and fluids.   Taking dayquil and nyquil.   Update me as needed.  She agrees.

## 2020-07-03 DIAGNOSIS — R921 Mammographic calcification found on diagnostic imaging of breast: Secondary | ICD-10-CM | POA: Diagnosis not present

## 2020-07-03 DIAGNOSIS — R928 Other abnormal and inconclusive findings on diagnostic imaging of breast: Secondary | ICD-10-CM | POA: Diagnosis not present

## 2020-07-04 LAB — HM MAMMOGRAPHY

## 2020-07-15 ENCOUNTER — Encounter: Payer: Self-pay | Admitting: Family Medicine

## 2020-08-07 DIAGNOSIS — N952 Postmenopausal atrophic vaginitis: Secondary | ICD-10-CM | POA: Diagnosis not present

## 2020-08-21 ENCOUNTER — Encounter: Payer: Self-pay | Admitting: Neurology

## 2020-08-21 ENCOUNTER — Other Ambulatory Visit: Payer: Self-pay

## 2020-08-21 ENCOUNTER — Ambulatory Visit (INDEPENDENT_AMBULATORY_CARE_PROVIDER_SITE_OTHER): Payer: BC Managed Care – PPO | Admitting: Neurology

## 2020-08-21 VITALS — BP 111/72 | HR 73 | Ht 67.0 in | Wt 186.0 lb

## 2020-08-21 DIAGNOSIS — G43709 Chronic migraine without aura, not intractable, without status migrainosus: Secondary | ICD-10-CM

## 2020-08-21 DIAGNOSIS — R202 Paresthesia of skin: Secondary | ICD-10-CM

## 2020-08-21 DIAGNOSIS — IMO0002 Reserved for concepts with insufficient information to code with codable children: Secondary | ICD-10-CM

## 2020-08-21 NOTE — Progress Notes (Signed)
Chief Complaint  Patient presents with  . New Patient (Initial Visit)    Referred for evaluation of neuropathy/paresthesia in both feet. Feels like she is walking on air bubbles. She also had one episode of tingling in her face that has resolved.  Reports a return on headaches. Estimates having one every other day.  Hx of migraines. Aleve is helpful for the pain. She will have dizziness at times.   Marland Kitchen PCP    [name], MD    HISTORICAL  Aimee Mcclain is a 64 year old female, seen in request by her primary care physician Dr. [name], for evaluation of bilateral feet paresthesia, frequent migraine headaches, initial evaluation was on August 21, 2020  I reviewed and summarized the referring note. Past medical history Chronic migraine headaches, but rarely happens now  Since 2020, she noticed intermittent bilateral foot paresthesia, involving plantar surface from heel to toes, she felt bubbly sensation, no pain, occasionally hot burning sensation, most noticeable in the morning time, she has to be careful with her first few steps, but she denies significant gait abnormality, occasionally neck pain, no upper extremity paresthesia or weakness, she has occasionally stress urgency,   I personally reviewed previou MRI of brain and cervical spine with without contrast in 2005, it was taken because she has frequent migraine headache pain, there was no significant abnormality noted.  Congenital mild cervical canal narrowing, but no canal stenosis.  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  ALLERGIES: Allergies  Allergen Reactions  . Ibuprofen Swelling    She has tolerated aleve  . Omnicef [Cefdinir] Itching  . Proton Pump Inhibitors     Headache.  . Vioxx [Rofecoxib] Swelling and Rash    HOME MEDICATIONS: Current Outpatient Medications  Medication Sig Dispense Refill  . Calcium 600-200 MG-UNIT tablet Take 1 tablet by mouth daily.    Marland Kitchen estradiol (ESTRING) 2 MG vaginal ring Place 2 mg vaginally every 3 (three) months. follow package directions    . Multiple Vitamin (MULTIVITAMIN) tablet Take 1 tablet by mouth daily.    . valACYclovir (VALTREX) 500 MG tablet Take 1 tablet (500 mg total) by mouth 2 (two) times daily as needed.    . vitamin B-12 (CYANOCOBALAMIN) 1000 MCG tablet Take 1 tablet (1,000 mcg total) by mouth daily.     No current facility-administered medications for this visit.    PAST MEDICAL HISTORY: Past Medical History:  Diagnosis Date  . Cataract   . Colon polyps   . Endometriosis   . Family history of colon cancer   . Family history of colonic polyps   . H/O bilateral breast implants   . History of colon polyps   . HPV in female   . HSV (herpes simplex virus) infection   . Migraine   . S/P TAH (total abdominal hysterectomy) 1991    PAST SURGICAL HISTORY: Past Surgical History:  Procedure Laterality Date  . BREAST BIOPSY     Benign.  Marland Kitchen BREAST ENHANCEMENT SURGERY    . CESAREAN SECTION    . PARTIAL HYSTERECTOMY    . TONSILLECTOMY    .  WISDOM TOOTH EXTRACTION      FAMILY HISTORY: Family History  Problem Relation Age of Onset  . Dementia Maternal Grandmother   . Heart disease Maternal Grandfather   . Colon cancer Maternal Grandfather        dx over 35  . Colon cancer Father        d. 59  . Heart disease Mother   . Colon cancer Mother        dx late 54s-70s  . Rectal cancer Mother   . Diabetes Brother   . Colon polyps Brother   . Diabetes Brother   . Colon polyps Brother   . Lymphoma Maternal Aunt   . Heart  disease Maternal Uncle   . Crohn's disease Daughter   . Esophageal cancer Neg Hx   . Stomach cancer Neg Hx   . Breast cancer Neg Hx     SOCIAL HISTORY: Social History   Socioeconomic History  . Marital status: Married    Spouse name: Not on file  . Number of children: 2  . Years of education: some college  . Highest education level: Not on file  Occupational History  . Occupation: Optometrist  Tobacco Use  . Smoking status: Former Smoker    Types: Cigarettes  . Smokeless tobacco: Never Used  Vaping Use  . Vaping Use: Never used  Substance and Sexual Activity  . Alcohol use: Yes    Comment: Rare  . Drug use: No  . Sexual activity: Yes    Comment: hyst  Other Topics Concern  . Not on file  Social History Narrative   Lives at home with husband.   Married 1988.   Right-handed.   4 cups caffeine per day.   Social Determinants of Health   Financial Resource Strain:   . Difficulty of Paying Living Expenses: Not on file  Food Insecurity:   . Worried About Charity fundraiser in the Last Year: Not on file  . Ran Out of Food in the Last Year: Not on file  Transportation Needs:   . Lack of Transportation (Medical): Not on file  . Lack of Transportation (Non-Medical): Not on file  Physical Activity:   . Days of Exercise per Week: Not on file  . Minutes of Exercise per Session: Not on file  Stress:   . Feeling of Stress : Not on file  Social Connections:   . Frequency of Communication with Friends and Family: Not on file  . Frequency of Social Gatherings with Friends and Family: Not on file  . Attends Religious Services: Not on file  . Active Member of Clubs or Organizations: Not on file  . Attends Archivist Meetings: Not on file  . Marital Status: Not on file  Intimate Partner Violence:   . Fear of Current or Ex-Partner: Not on file  . Emotionally Abused: Not on file  . Physically Abused: Not on file  . Sexually Abused: Not on file     PHYSICAL EXAM    Vitals:   08/21/20 1030  BP: 111/72  Pulse: 73  Weight: 186 lb (84.4 kg)  Height: 5\' 7"  (1.702 m)   Not recorded     Body mass index is 29.13 kg/m.  PHYSICAL EXAMNIATION:  Gen: NAD, conversant, well nourised, well groomed                     Cardiovascular: Regular rate rhythm, no peripheral edema, warm, nontender. Eyes: Conjunctivae clear without exudates  or hemorrhage Neck: Supple, no carotid bruits. Pulmonary: Clear to auscultation bilaterally   NEUROLOGICAL EXAM:  MENTAL STATUS: Speech:    Speech is normal; fluent and spontaneous with normal comprehension.  Cognition:     Orientation to time, place and person     Normal recent and remote memory     Normal Attention span and concentration     Normal Language, naming, repeating,spontaneous speech     Fund of knowledge   CRANIAL NERVES: CN II: Visual fields are full to confrontation. Pupils are round equal and briskly reactive to light. CN III, IV, VI: extraocular movement are normal. No ptosis. CN V: Facial sensation is intact to light touch CN VII: Face is symmetric with normal eye closure  CN VIII: Hearing is normal to causal conversation. CN IX, X: Phonation is normal. CN XI: Head turning and shoulder shrug are intact  MOTOR: There is no pronator drift of out-stretched arms. Muscle bulk and tone are normal. Muscle strength is normal.  REFLEXES: Reflexes are 2+ and symmetric at the biceps, triceps, 3/3 knees, and ankles. Plantar responses are flexor.  SENSORY: Intact to light touch, pinprick and vibratory sensation are intact in fingers and toes.  COORDINATION: There is no trunk or limb dysmetria noted.  GAIT/STANCE: Posture is normal. Gait is steady with normal steps, base, arm swing, and turning. Heel and toe walking are normal. Tandem gait is normal.  Romberg is absent.   DIAGNOSTIC DATA (LABS, IMAGING, TESTING) - I reviewed patient records, labs, notes, testing and imaging myself where  available.   ASSESSMENT AND PLAN  Aimee Mcclain is a 64 y.o. female   Constellation of complaints, essentially normal neurological examination other than hyperreflexia of bilateral knee Bilateral feet paresthesia, Occasionally word finding difficulties,  I have discussed with patient about potential more extensive evaluation MRI of cervical spine to rule out cervical myelopathy, EMG nerve conduction study for evaluation of peripheral neuropathy,  She decided to hold off  Laboratory evaluations to rule out treatable cause for peripheral neuropathy  She will call clinic for worsening symptoms   Marcial Pacas, M.D. Ph.D.  [facility] [address], [place], [place] [address] Ph: [phone] Fax: [phone]  CC:  [name], MD [address] [place],  [place] [address]

## 2020-08-21 NOTE — Patient Instructions (Signed)
MRI cervical  EMG/NCS

## 2020-08-27 DIAGNOSIS — H10413 Chronic giant papillary conjunctivitis, bilateral: Secondary | ICD-10-CM | POA: Diagnosis not present

## 2020-08-27 LAB — MULTIPLE MYELOMA PANEL, SERUM
Albumin SerPl Elph-Mcnc: 4.2 g/dL (ref 2.9–4.4)
Albumin/Glob SerPl: 1.7 (ref 0.7–1.7)
Alpha 1: 0.2 g/dL (ref 0.0–0.4)
Alpha2 Glob SerPl Elph-Mcnc: 0.7 g/dL (ref 0.4–1.0)
B-Globulin SerPl Elph-Mcnc: 0.9 g/dL (ref 0.7–1.3)
Gamma Glob SerPl Elph-Mcnc: 0.9 g/dL (ref 0.4–1.8)
Globulin, Total: 2.6 g/dL (ref 2.2–3.9)
IgA/Immunoglobulin A, Serum: 98 mg/dL (ref 87–352)
IgG (Immunoglobin G), Serum: 885 mg/dL (ref 586–1602)
IgM (Immunoglobulin M), Srm: 85 mg/dL (ref 26–217)
Total Protein: 6.8 g/dL (ref 6.0–8.5)

## 2020-08-27 LAB — C-REACTIVE PROTEIN: CRP: <1 mg/L (ref 0–10)

## 2020-08-27 LAB — ANA W/REFLEX IF POSITIVE: Anti Nuclear Antibody (ANA): NEGATIVE

## 2020-08-27 LAB — RPR: RPR Ser Ql: NONREACTIVE

## 2020-08-27 LAB — HGB A1C W/O EAG: Hgb A1c MFr Bld: 5.2 % (ref 4.8–5.6)

## 2020-08-27 LAB — SEDIMENTATION RATE: Sed Rate: 2 mm/hr (ref 0–40)

## 2020-08-27 LAB — COPPER, SERUM: Copper: 109 ug/dL (ref 80–158)

## 2020-09-16 ENCOUNTER — Encounter: Payer: Self-pay | Admitting: Family Medicine

## 2020-09-21 ENCOUNTER — Other Ambulatory Visit: Payer: Self-pay

## 2020-09-21 ENCOUNTER — Ambulatory Visit (INDEPENDENT_AMBULATORY_CARE_PROVIDER_SITE_OTHER): Payer: BC Managed Care – PPO

## 2020-09-21 DIAGNOSIS — Z23 Encounter for immunization: Secondary | ICD-10-CM | POA: Diagnosis not present

## 2021-01-02 DIAGNOSIS — R928 Other abnormal and inconclusive findings on diagnostic imaging of breast: Secondary | ICD-10-CM | POA: Diagnosis not present

## 2021-01-13 ENCOUNTER — Other Ambulatory Visit: Payer: Self-pay | Admitting: Radiology

## 2021-01-13 DIAGNOSIS — C50212 Malignant neoplasm of upper-inner quadrant of left female breast: Secondary | ICD-10-CM | POA: Diagnosis not present

## 2021-01-13 DIAGNOSIS — Z170 Estrogen receptor positive status [ER+]: Secondary | ICD-10-CM | POA: Diagnosis not present

## 2021-01-13 DIAGNOSIS — R590 Localized enlarged lymph nodes: Secondary | ICD-10-CM | POA: Diagnosis not present

## 2021-01-15 ENCOUNTER — Telehealth: Payer: Self-pay | Admitting: Hematology

## 2021-01-15 ENCOUNTER — Encounter: Payer: Self-pay | Admitting: Obstetrics and Gynecology

## 2021-01-15 NOTE — Telephone Encounter (Signed)
SPOKE TO PATIENT TO CONFIRM AM CLINIC APPOINTMENT FOR 2/9, PACKET WILL BE SENT BY [facility]

## 2021-01-16 ENCOUNTER — Encounter: Payer: Self-pay | Admitting: *Deleted

## 2021-01-16 DIAGNOSIS — Z170 Estrogen receptor positive status [ER+]: Secondary | ICD-10-CM

## 2021-01-16 DIAGNOSIS — C50212 Malignant neoplasm of upper-inner quadrant of left female breast: Secondary | ICD-10-CM

## 2021-01-22 ENCOUNTER — Ambulatory Visit
Admission: RE | Admit: 2021-01-22 | Discharge: 2021-01-22 | Disposition: A | Discharge status: Home / Self Care | Payer: BC Managed Care – PPO | Source: Ambulatory Visit | Attending: Radiation Oncology | Admitting: Radiation Oncology

## 2021-01-22 ENCOUNTER — Encounter: Payer: Self-pay | Admitting: Hematology

## 2021-01-22 ENCOUNTER — Inpatient Hospital Stay: Payer: BC Managed Care – PPO | Attending: Hematology | Admitting: Hematology

## 2021-01-22 ENCOUNTER — Inpatient Hospital Stay: Payer: BC Managed Care – PPO

## 2021-01-22 ENCOUNTER — Other Ambulatory Visit: Payer: Self-pay

## 2021-01-22 ENCOUNTER — Other Ambulatory Visit: Payer: Self-pay | Admitting: General Surgery

## 2021-01-22 ENCOUNTER — Encounter: Payer: Self-pay | Admitting: Licensed Clinical Social Worker

## 2021-01-22 ENCOUNTER — Encounter: Payer: Self-pay | Admitting: *Deleted

## 2021-01-22 ENCOUNTER — Ambulatory Visit: Payer: BC Managed Care – PPO | Attending: General Surgery | Admitting: Physical Therapy

## 2021-01-22 ENCOUNTER — Encounter: Payer: Self-pay | Admitting: Physical Therapy

## 2021-01-22 ENCOUNTER — Encounter: Payer: Self-pay | Admitting: Radiation Oncology

## 2021-01-22 ENCOUNTER — Other Ambulatory Visit: Payer: Self-pay | Admitting: Pharmacist

## 2021-01-22 VITALS — BP 133/72 | HR 93 | Temp 97.7°F | Resp 18 | Ht 67.0 in | Wt 198.5 lb

## 2021-01-22 DIAGNOSIS — R293 Abnormal posture: Secondary | ICD-10-CM | POA: Diagnosis not present

## 2021-01-22 DIAGNOSIS — G629 Polyneuropathy, unspecified: Secondary | ICD-10-CM | POA: Diagnosis not present

## 2021-01-22 DIAGNOSIS — R519 Headache, unspecified: Secondary | ICD-10-CM | POA: Insufficient documentation

## 2021-01-22 DIAGNOSIS — Z170 Estrogen receptor positive status [ER+]: Secondary | ICD-10-CM

## 2021-01-22 DIAGNOSIS — C50212 Malignant neoplasm of upper-inner quadrant of left female breast: Secondary | ICD-10-CM | POA: Diagnosis not present

## 2021-01-22 DIAGNOSIS — Z9882 Breast implant status: Secondary | ICD-10-CM | POA: Diagnosis not present

## 2021-01-22 DIAGNOSIS — N951 Menopausal and female climacteric states: Secondary | ICD-10-CM | POA: Insufficient documentation

## 2021-01-22 LAB — CBC WITH DIFFERENTIAL (CANCER CENTER ONLY)
Abs Immature Granulocytes: 0.01 10*3/uL (ref 0.00–0.07)
Basophils Absolute: 0 10*3/uL (ref 0.0–0.1)
Basophils Relative: 1 %
Eosinophils Absolute: 0.1 10*3/uL (ref 0.0–0.5)
Eosinophils Relative: 1 %
HCT: 42.4 % (ref 36.0–46.0)
Hemoglobin: 14.1 g/dL (ref 12.0–15.0)
Immature Granulocytes: 0 %
Lymphocytes Relative: 34 %
Lymphs Abs: 1.5 10*3/uL (ref 0.7–4.0)
MCH: 29.1 pg (ref 26.0–34.0)
MCHC: 33.3 g/dL (ref 30.0–36.0)
MCV: 87.4 fL (ref 80.0–100.0)
Monocytes Absolute: 0.4 10*3/uL (ref 0.1–1.0)
Monocytes Relative: 9 %
Neutro Abs: 2.4 10*3/uL (ref 1.7–7.7)
Neutrophils Relative %: 55 %
Platelet Count: 230 10*3/uL (ref 150–400)
RBC: 4.85 MIL/uL (ref 3.87–5.11)
RDW: 12.8 % (ref 11.5–15.5)
WBC Count: 4.4 10*3/uL (ref 4.0–10.5)
nRBC: 0 % (ref 0.0–0.2)

## 2021-01-22 LAB — GENETIC SCREENING ORDER

## 2021-01-22 LAB — CMP (CANCER CENTER ONLY)
ALT: 28 U/L (ref 0–44)
AST: 26 U/L (ref 15–41)
Albumin: 4.4 g/dL (ref 3.5–5.0)
Alkaline Phosphatase: 101 U/L (ref 38–126)
Anion gap: 7 (ref 5–15)
BUN: 17 mg/dL (ref 8–23)
CO2: 27 mmol/L (ref 22–32)
Calcium: 9.5 mg/dL (ref 8.9–10.3)
Chloride: 108 mmol/L (ref 98–111)
Creatinine: 0.83 mg/dL (ref 0.44–1.00)
GFR, Estimated: >60 mL/min (ref >60)
Glucose, Bld: 89 mg/dL (ref 70–99)
Potassium: 4 mmol/L (ref 3.5–5.1)
Sodium: 142 mmol/L (ref 135–145)
Total Bilirubin: 0.4 mg/dL (ref 0.3–1.2)
Total Protein: 7.4 g/dL (ref 6.5–8.1)

## 2021-01-22 NOTE — Progress Notes (Signed)
[place] Work  Initial Assessment   Aimee Mcclain is a 65 y.o. year old female accompanied by patient and husband, Aimee Mcclain. Clinical Social Work was referred by [facility] for assessment of psychosocial needs.   SDOH (Social Determinants of Health) assessments performed: Yes SDOH Interventions   Flowsheet Row Most Recent Value  SDOH Interventions   Food Insecurity Interventions Intervention Not Indicated  Financial Strain Interventions Intervention Not Indicated  Housing Interventions Intervention Not Indicated  Transportation Interventions Intervention Not Indicated      Distress Screen completed: Yes ONCBCN DISTRESS SCREENING 01/22/2021  Screening Type Initial Screening  Distress experienced in past week (1-10) 1  Information Concerns Type Lack of info about diagnosis      Family/Social Information:   Housing Arrangement: patient lives with husband  Family members/support persons in your life? 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 Income source: Employment  Financial concerns: No o Type of concern: None  Food access concerns: no  Services Currently in place:  n/a  Coping/ Adjustment to diagnosis:  Patient understands treatment plan and what happens next? yes, feels better after meeting with team today and getting more information  Concerns about diagnosis and/or treatment: I'm not especially worried about anything  Patient reported stressors: general adjustment  Patient enjoys being outside and time with family/ friends  Current coping skills/ strengths: Average or above average intelligence, Capable of independent living, Communication skills, Scientist, research (life sciences), Motivation for treatment/growth and Supportive family/friends    SUMMARY: Current SDOH Barriers:   No significant SDOH barriers noted today  Clinical Social Work Clinical  Goal(s):   No clinical SW goals at this time  Interventions:  Discussed the importance of support during treatment  Informed patient of the support team roles and support services at [facility]  Provided CSW contact information and encouraged patient to call with any questions or concerns   Follow Up Plan: Patient will contact CSW with any support or resource needs Patient verbalizes understanding of plan: Yes    Aimee Mcclain , LCSW

## 2021-01-22 NOTE — Patient Instructions (Signed)

## 2021-01-22 NOTE — Progress Notes (Signed)
Radiation Oncology         [phone] ________________________________  Initial Outpatient Consultation  Name: Aimee Mcclain MRN: [id number]  Date: 01/22/2021  DOB: July 15, 1956  [id number], Elveria Rising, MD  [name], MD   REFERRING PHYSICIAN: [name], MD  DIAGNOSIS:    ICD-10-CM   1. 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 female who presented with left breast abnormality on the following imaging: follow-up left breast mammogram on 01/02/2021 for previously seen asymmetry and grouped calcifications. No symptoms were reported at that time. Ultrasound of the left breast on 01/13/2021 revealed a 1.8 x 1.5 x 1.0 cm in the left breast at the 11 o'clock position, 4 cm from the nipple. Biopsy on the date of 01/13/2021 showed invasive ductal carcinoma. Left axillary lymph node was negative for carcinoma according to biopsy but this tissue was felt to be discordant.  It remains suspicious on imaging.   Regarding breast tumor histology: ER status: 95% strong; PR status: 85% strong; Her2 status: positive; Grade: 3.  She is otherwise in her usual state of health.  She is here with her husband today.  She works from home for her [employer].  She has received all of her Covid shots.  PREVIOUS RADIATION THERAPY: No  PAST MEDICAL HISTORY:  has a past medical history of Breast cancer  ([place]), Cataract, Colon polyps, Endometriosis, Family history of colon cancer, Family history of colonic polyps, GERD (gastroesophageal reflux disease), H/O bilateral breast implants, History of colon polyps, HPV in female, HSV (herpes simplex virus) infection, Migraine, and S/P TAH (total abdominal hysterectomy) (1991).    PAST SURGICAL HISTORY: Past Surgical History:  Procedure Laterality Date  . ABDOMINAL HYSTERECTOMY    . BREAST BIOPSY     Benign.  Marland Kitchen BREAST ENHANCEMENT SURGERY    . BREAST LUMPECTOMY Bilateral 1993 and 1995  . CESAREAN SECTION    . PARTIAL HYSTERECTOMY    . TONSILLECTOMY    . WISDOM TOOTH EXTRACTION      FAMILY HISTORY: family history includes Colon cancer in her father, maternal grandfather, maternal grandmother, and mother; Colon polyps in her brother and brother; Crohn's disease in her daughter; Dementia in her maternal grandmother; Diabetes in her brother and brother; Heart disease in her maternal grandfather, maternal uncle, and mother; Lymphoma in her maternal aunt; Rectal cancer (age of onset: 29) in her mother.  SOCIAL HISTORY:  reports that she quit smoking about 27 years ago. Her smoking use included cigarettes. She has a 5.00 pack-year smoking history. She has never used smokeless tobacco. She reports current alcohol use. She reports that she does not use drugs.  ALLERGIES: Ibuprofen, Omnicef [cefdinir], Proton pump inhibitors, and Vioxx [rofecoxib]  MEDICATIONS:  Current Outpatient Medications  Medication Sig Dispense Refill  . acetaminophen (TYLENOL) 500 MG tablet Take 1,000 mg by mouth every 6 (six) hours as needed.    Marland Kitchen  Calcium 600-200 MG-UNIT tablet Take 1 tablet by mouth daily.    . calcium carbonate (TUMS - DOSED IN MG ELEMENTAL CALCIUM) 500 MG chewable tablet Chew 1 tablet by mouth as needed for indigestion or heartburn. Takes 5-6 per day    . Multiple Vitamin (MULTIVITAMIN) tablet Take 1 tablet by mouth daily.    . valACYclovir (VALTREX) 500 MG  tablet Take 1 tablet (500 mg total) by mouth 2 (two) times daily as needed.    . vitamin B-12 (CYANOCOBALAMIN) 1000 MCG tablet Take 1 tablet (1,000 mcg total) by mouth daily.     No current facility-administered medications for this encounter.     REVIEW OF SYSTEMS: As above     PHYSICAL EXAM:  vitals were not taken for this visit.   General: Alert and oriented, in no acute distress Psychiatric: Judgment and insight are intact. Affect is appropriate. Breasts: Her lateral breasts are symmetrically dense.  No suspicious palpable masses appreciated in the breasts or axillae appreciated bilaterally.   ECOG = 0  0 - Asymptomatic (Fully active, able to carry on all predisease activities without restriction)  1 - Symptomatic but completely ambulatory (Restricted in physically strenuous activity but ambulatory and able to carry out work of a light or sedentary nature. For example, light housework, office work)  2 - Symptomatic, <50% in bed during the day (Ambulatory and capable of all self care but unable to carry out any work activities. Up and about more than 50% of waking hours)  3 - Symptomatic, >50% in bed, but not bedbound (Capable of only limited self-care, confined to bed or chair 50% or more of waking hours)  4 - Bedbound (Completely disabled. Cannot carry on any self-care. Totally confined to bed or chair)  5 - Death   Eustace Pen MM, Creech RH, Tormey DC, et al. [phone]). "Toxicity and response criteria of the [facility]". [place] Oncol. 5 (6): 649-55   LABORATORY DATA:  Lab Results  Component Value Date   WBC 4.4 01/22/2021   HGB 14.1 01/22/2021   HCT 42.4 01/22/2021   MCV 87.4 01/22/2021   PLT 230 01/22/2021   CMP     Component Value Date/Time   NA 142 01/22/2021 0813   K 4.0 01/22/2021 0813   CL 108 01/22/2021 0813   CO2 27 01/22/2021 0813   GLUCOSE 89 01/22/2021 0813   BUN 17 01/22/2021 0813   CREATININE 0.83 01/22/2021 0813   CALCIUM 9.5  01/22/2021 0813   PROT 7.4 01/22/2021 0813   PROT 6.8 08/21/2020 1121   ALBUMIN 4.4 01/22/2021 0813   AST 26 01/22/2021 0813   ALT 28 01/22/2021 0813   ALKPHOS 101 01/22/2021 0813   BILITOT 0.4 01/22/2021 0813   GFRNONAA >60 01/22/2021 0813         RADIOGRAPHY: As above   IMPRESSION/PLAN: Left breast cancer  She has been discussed at our multidisciplinary tumor board.  The consensus is that she would be a good candidate for breast conservation. I talked to her about the option of a mastectomy and informed her that her expected overall survival would be equivalent between mastectomy and breast conservation, based upon randomized controlled data. She is enthusiastic about breast conservation.  The consensus of the team is to pursue an MRI of her breasts and axillary regions.  Based on these results the decision will be made as to whether chemotherapy should be given neoadjuvantly or adjuvantly.  She understands that both surgery and chemotherapy will  be given before she pursues radiation therapy.  It was a pleasure meeting the patient today. We discussed the risks, benefits, and side effects of radiotherapy. I recommend radiotherapy to the left breast and regional nodes to reduce her risk of locoregional recurrence by 2/3.  We discussed that radiation would take approximately 6 weeks to complete and that I would give the patient a few weeks to heal following surgery before starting treatment planning. If chemotherapy were to be given, this would precede radiotherapy (and possibly surgery). We spoke about acute effects including skin irritation and fatigue as well as much less common late effects including internal organ injury or irritation. We spoke about the latest technology that is used to minimize the risk of late effects for patients undergoing radiotherapy to the breast or chest wall. No guarantees of treatment were given. The patient is enthusiastic about proceeding with treatment. I look  forward to participating in the patient's care.  I will await her referral back to me for postoperative follow-up and eventual CT simulation/treatment planning.  We discussed measures to reduce the risk of infection during the COVID-19 pandemic.  She is received all vaccinations up-to-date.  On date of service, in total, I spent 30 minutes on this encounter. Patient was seen in person.   __________________________________________   Eppie Gibson, MD  This document serves as a record of services personally performed by Eppie Gibson, MD. It was created on his behalf by Clerance Lav, a trained medical scribe. The creation of this record is based on the scribe's personal observations and the provider's statements to them. This document has been checked and approved by the attending provider.

## 2021-01-22 NOTE — Therapy (Signed)
[place], [place], [address] Phone: [phone]   Fax:  [phone]  Physical Therapy Evaluation  Patient Details  Name: Aimee Mcclain MRN: [id number] Date of Birth: 09-Apr-1956 Referring Provider (PT): Dr. Stark Klein   Encounter Date: 01/22/2021   PT End of Session - 01/22/21 1135    Visit Number 1    Number of Visits 2    Date for PT Re-Evaluation 07/22/21    PT Start Time 1048    PT Stop Time 1120    PT Time Calculation (min) 32 min    Activity Tolerance Patient tolerated treatment well    Behavior During Therapy [facility] for tasks assessed/performed           Past Medical History:  Diagnosis Date  . Breast cancer ([place])   . Cataract   . Colon polyps   . Endometriosis   . Family history of colon cancer   . Family history of colonic polyps   . GERD (gastroesophageal reflux disease)   . H/O bilateral breast implants   . History of colon polyps   . HPV in female   . HSV (herpes simplex virus) infection   . Migraine   . S/P TAH (total abdominal hysterectomy) 1991    Past Surgical History:  Procedure Laterality Date  . ABDOMINAL HYSTERECTOMY    . BREAST BIOPSY     Benign.  [facility] BREAST ENHANCEMENT SURGERY    . BREAST LUMPECTOMY Bilateral 1993 and 1995  . CESAREAN SECTION    . PARTIAL HYSTERECTOMY    . TONSILLECTOMY    . WISDOM TOOTH EXTRACTION      There were no vitals filed for this visit.    Subjective Assessment - 01/22/21 1128    Subjective Patient reports she is here today to be seen by her medical team for her newly diagnosed left breast cancer.    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 It measures 1.8 cm and is located in the upper inner quadrant. Ki67 is 30%. She had an axillary lymph node biopsied and it was negative.    Patient Stated  Goals Reduce lymphedema risk and learn post op shoulder ROM HEP    Currently in Pain? No/denies              [facility] PT Assessment - 01/22/21 0001      Assessment   Medical Diagnosis Left breast cancer    Referring Provider (PT) Dr. Stark Klein    Onset Date/Surgical Date 01/02/21    Hand Dominance Right    Prior Therapy none      Precautions   Precautions Other (comment)    Precaution Comments active cancer      Restrictions   Weight Bearing Restrictions No      Balance Screen   Has the patient fallen in the past 6 months No    Has the patient had a decrease in activity level because of a fear of falling?  No    Is the patient reluctant to leave their home because of a fear of falling?  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, maintenance (IT); on computer most of the day    Leisure She recently joined [employer] but has not exercised      Cognition   Overall Cognitive Status Within Functional Limits for tasks assessed      Posture/Postural Control   Posture/Postural Control Postural limitations    Postural Limitations Rounded Shoulders;Forward head      ROM / Strength   AROM / PROM / Strength AROM;Strength      AROM   Overall AROM Comments Left cervical sidebending and right rotation limited 25%; other cervical AROM is WNL    AROM Assessment Site Shoulder    Right/Left Shoulder Right;Left    Right Shoulder Extension 41 Degrees    Right Shoulder Flexion 155 Degrees    Right Shoulder ABduction 159 Degrees    Right Shoulder Internal Rotation 63 Degrees    Right Shoulder External Rotation 77 Degrees    Left Shoulder Extension 54 Degrees    Left Shoulder Flexion 152 Degrees     Left Shoulder ABduction 157 Degrees    Left Shoulder Internal Rotation 61 Degrees    Left Shoulder External Rotation 80 Degrees      Strength   Overall Strength Within functional limits for tasks performed             LYMPHEDEMA/ONCOLOGY QUESTIONNAIRE - 01/22/21 0001      Type   Cancer Type Left breast cancer      Lymphedema Assessments   Lymphedema Assessments Upper extremities      Right Upper Extremity Lymphedema   10 cm Proximal to Olecranon Process 31 cm    Olecranon Process 27.2 cm    10 cm Proximal to Ulnar Styloid Process 24.4 cm    Just Proximal to Ulnar Styloid Process 16.8 cm    Across Hand at [employer] 20.1 cm    At [place] of 2nd Digit 7.1 cm      Left Upper Extremity Lymphedema   10 cm Proximal to Olecranon Process 29.9 cm    Olecranon Process 26.7 cm    10 cm Proximal to Ulnar Styloid Process 23 cm    Just Proximal to Ulnar Styloid Process 16.9 cm    Across Hand at [employer] 20.4 cm    At [place] of 2nd Digit 7.2 cm           L-DEX FLOWSHEETS - 01/22/21 1100      L-DEX LYMPHEDEMA SCREENING   Measurement Type Unilateral    L-DEX MEASUREMENT EXTREMITY Upper Extremity    POSITION  Standing    DOMINANT SIDE Right    At Risk Side Left    BASELINE SCORE (UNILATERAL) 2.6           The patient was assessed using the L-Dex machine today to produce a lymphedema index baseline score. The patient will be reassessed on a regular basis (typically every 3 months) to obtain new L-Dex scores. If the score is > 6.5 points away from his/her baseline score indicating onset of subclinical lymphedema, it will be recommended to wear a compression garment for 4 weeks, 12 hours per day and then be reassessed. If the score continues to be > 6.5 points from baseline at reassessment, we will initiate lymphedema treatment. Assessing in this manner has a 95% rate of preventing clinically significant lymphedema.      Katina Dung - 01/22/21 0001  Open a tight or new  jar Mild difficulty    Do heavy household chores (wash walls, wash floors) No difficulty    Carry a shopping bag or briefcase No difficulty    Wash your back No difficulty    Use a knife to cut food No difficulty    Recreational activities in which you take some force or impact through your arm, shoulder, or hand (golf, hammering, tennis) No difficulty    During the past week, to what extent has your arm, shoulder or hand problem interfered with your normal social activities with family, friends, neighbors, or groups? Not at all    During the past week, to what extent has your arm, shoulder or hand problem limited your work or other regular daily activities Not at all    Arm, shoulder, or hand pain. None    Tingling (pins and needles) in your arm, shoulder, or hand None    Difficulty Sleeping No difficulty    DASH Score 2.27 %            Objective measurements completed on examination: See above findings.        Patient was instructed today in a home exercise program today for post op shoulder range of motion. These included active assist shoulder flexion in sitting, scapular retraction, wall walking with shoulder abduction, and hands behind head external rotation.  She was encouraged to do these twice a day, holding 3 seconds and repeating 5 times when permitted by her physician.           PT Education - 01/22/21 1134    Education Details Lymphedema risk reduction and post op shoulder ROM HEP    Person(s) Educated Patient;Spouse    Methods Explanation;Demonstration;Handout    Comprehension Returned demonstration;Verbalized understanding               PT Long Term Goals - 01/22/21 1138      PT LONG TERM GOAL #1   Title Patient will demonstrate she has regained full shoulder ROM and function post operatively compared to baselines.    Time 6    Period Months    Status New    Target Date 07/22/21           Breast Clinic Goals - 01/22/21 1138      Patient will  be able to verbalize understanding of pertinent lymphedema risk reduction practices relevant to her diagnosis specifically related to skin care.   Time 1    Period Days    Status Achieved      Patient will be able to return demonstrate and/or verbalize understanding of the post-op home exercise program related to regaining shoulder range of motion.   Time 1    Period Days    Status Achieved      Patient will be able to verbalize understanding of the importance of attending the postoperative After Breast Cancer Class for further lymphedema risk reduction education and therapeutic exercise.   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 It measures 1.8 cm and is located in the upper inner quadrant. Ki67 is 30%. She had an axillary lymph node biopsied and it was negative. Her multidisciplinary medical team met prior to her assessments  to determine a recommended treatment plan. She is planning to have an MRI and then depending on those results either begin with neoadjuvant chemotherapy or surgery which would involve a left lumpectomy and sentinel node biopsy followed by radiation and anti-estrogen therapy. She will benefit from a post op PT reassessment to determine needs and from L-Dex screens every 3 months to detect subclinical lymphedema.    Stability/Clinical Decision Making Stable/Uncomplicated    Clinical Decision Making Low    Rehab Potential Excellent    PT Frequency --   Eval and 1 f/u visit   PT Treatment/Interventions ADLs/Self Care Home Management;Therapeutic exercise;Patient/family education    PT Next Visit Plan Will reassess 3-4 weeks post op to determine needs    PT Home Exercise Plan Post op shoulder ROM HEP    Consulted and Agree with Plan of Care Patient;Family member/caregiver    Family  Member Consulted Husband           Patient will benefit from skilled therapeutic intervention in order to improve the following deficits and impairments:  Postural dysfunction,Decreased range of motion,Decreased knowledge of precautions,Impaired UE functional use,Pain  Visit Diagnosis: Malignant neoplasm of upper-inner quadrant of left breast in female, estrogen receptor positive ([place]) - Plan: PT plan of care cert/re-cert  Abnormal posture - Plan: PT plan of care cert/re-cert     Problem List Patient Active Problem List   Diagnosis Date Noted  . Malignant neoplasm of upper-inner quadrant of left breast in female, estrogen receptor positive ([place]) 01/16/2021  . Chronic migraine 08/21/2020  . URI (upper respiratory infection) 06/30/2020  . Screening mammogram, encounter for 06/30/2020  . Paresthesia 06/09/2020  . Neuropathy 02/14/2020  . Atypical chest pain 02/14/2020  . Healthcare maintenance 02/14/2020  . Migraine   . Advance care planning 01/16/2019  . Family history of colon cancer   . HPV in female    Annia Friendly, [place] 01/22/21 11:41 AM  [place], [place], [address] Phone: [phone]   Fax:  [phone]  Name: Aimee Mcclain MRN: [id number] Date of Birth: 1956/10/09

## 2021-01-22 NOTE — Progress Notes (Signed)
[place]   Telephone:([phone]) [phone] Fax:([phone]) [place] Note   Patient Care Team: [name], MD as PCP - General (Family Medicine) Rockwell Germany, RN as Oncology Nurse Navigator Mauro Kaufmann, RN as Oncology Nurse Navigator Stark Klein, MD as Consulting Physician (General Surgery) Truitt Merle, MD as Consulting Physician (Hematology) Eppie Gibson, MD as Attending Physician (Radiation Oncology)  Date of Service:  01/22/2021   CHIEF COMPLAINTS/PURPOSE OF CONSULTATION:  Newly diagnosed Malignant neoplasm of upper-inner quadrant of left breast    Oncology History Overview Note  Cancer Staging Malignant neoplasm of upper-inner quadrant of left breast in female, estrogen receptor positive ([place]) Staging form: Breast, AJCC 8th Edition - Clinical stage from 01/14/2020: Stage IB (cT1c, cN1, cM0, G3, ER+, PR+, HER2+) - Signed by Truitt Merle, MD on 01/21/2021 Stage prefix: Initial diagnosis    Malignant neoplasm of upper-inner quadrant of left breast in female, estrogen receptor positive ([place])  01/14/2020 Cancer Staging   Staging form: Breast, AJCC 8th Edition - Clinical stage from 01/14/2020: Stage IB (cT1c, cN1, cM0, G3, ER+, PR+, HER2+) - Signed by Truitt Merle, MD on 01/21/2021 Stage prefix: Initial diagnosis   01/02/2021 Mammogram   Left breast with 1.9x1x1.8 cm complex cyst at 12:00 position, 4cmfn, versus saloid mass with calcifications, irregulr, increased in size. Biopsy recommended.    01/13/2021 Initial Biopsy   Diagnosis 1. Breast, left, needle core biopsy, 11 o'clock, 4cmfn - INVASIVE DUCTAL CARCINOMA - SEE COMMENT 2. Lymph node, needle/core biopsy, left axilla - FIBROVASCULAR AND ADIPOSE TISSUE - NO LYMPHOID TISSUE OR CARCINOMA IDENTIFIED Microscopic Comment 1. Based on the biopsy, the carcinoma appears Nottingham grade 3 of 3 and measures 1.2 cm in greatest linear extent. Prognostic markers (ER/PR/ki-67/HER2) are pending and will be  reported in an addendum. Dr. Jeannie Done reviewed the case and agrees with the above diagnosis. These results were called to [facility] on January 14, 2021   01/13/2021 Receptors her2   1. PROGNOSTIC INDICATORS Results: IMMUNOHISTOCHEMICAL AND MORPHOMETRIC ANALYSIS PERFORMED MANUALLY The tumor cells are POSITIVE for Her2 (3+). Estrogen Receptor: 95%, POSITIVE, STRONG STAINING INTENSITY Progesterone Receptor: 85%, POSITIVE, STRONG STAINING INTENSITY Proliferation Marker Ki67: 30%   01/16/2021 Initial Diagnosis   Malignant neoplasm of upper-inner quadrant of left breast in female, estrogen receptor positive ([facility])      HISTORY OF PRESENTING ILLNESS:  Aimee Mcclain 65 y.o. female is a here because of newly diagnosed left breast cancer. The patient presents to the Breast clinic today accompanied by her husband.  She notes she did not feel the mass herself. This was found by screening mammogram. She denies recent skin change or nipple discharge, fatigue, breast pain. She has been getting mammograms yearly with her Gyn. She had an abnormal mammogram before and b/l lumpectomy 20 years ago. This is her first biopsy. She notes she still has b/l implants in.  She notes she feels baseline with no chest pain, breathing issues, abdominal pain or weight changes. She notes she has had chronic migraines that did stop previously but returned in frontal and occipital lobes every 1-2 days in the past 2-3 months. This has been manageable with Tylenol. She denies vision change, weaknesses or numbness. She has known neuropathy in her feet, etiology unknown. She continues to f/u with Dr Krista Blue. She also notes heaviness in her chest daily that lasts for hours, she attributes this to anxiety. This occurs without exertion. She also notes a golf ball size nodule  in her left lateral thigh.   Socially she is married with 2 adult children. She does not drink much. She smoked 1/2-1 ppd intermittently for 10 years and  stopped 25 years ago. 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  She has a PMHx of Acid Reflux, on Tums. She had colonoscopy in 2019 and Upper endoscopy in 2020. She had partial hysterectomy in 06/1990 at age. She notes she started menopause in early 74s. She used hormonal supplements for over 5 years, she stopped with breast cancer diagnosis. She notes her hot flashes have returned, but mostly manageable. She has joint pain in her hands and hips mostly. She notes her mother was in late 77s with rectal cancer. Her MGF had colon cancer.     GYN HISTORY  Menarchal: 13 LMP: Early 53s.  Contraceptive: Yes, up until her hysterectomy HRT: Estrogen patch (5 years), Estradial (90 days), vagifim tabs for 5 years, stopped in 12/2020 G2P: first at 26, breast fed with first child.     REVIEW OF SYSTEMS:    Constitutional: Denies fevers, chills or abnormal night sweats (+) Intermittent Headaches (+) hot flashes  Eyes: Denies blurriness of vision, double vision or watery eyes Ears, nose, mouth, throat, and face: Denies mucositis or sore throat Respiratory: Denies cough, dyspnea or wheezes Cardiovascular: Denies palpitation, chest discomfort or lower extremity swelling Gastrointestinal:  Denies nausea, heartburn or change in bowel habits Skin: Denies abnormal skin rashes (+) golf ball size nodule in her left lateral thigh.  MSK: (+) Joint pain in hands and hips  Lymphatics: Denies new lymphadenopathy or easy bruising Neurological:Denies numbness, tingling or new weaknesses Behavioral/Psych: Mood is stable, no new changes  All other systems were reviewed with the patient and are negative.  MEDICAL HISTORY:  Past Medical History:  Diagnosis Date  . Breast cancer ([place])   . Cataract   . Colon polyps   . Endometriosis   . Family history of colon cancer   . Family history of colonic polyps   . GERD (gastroesophageal reflux disease)   . H/O bilateral breast implants   . History of colon polyps    . HPV in female   . HSV (herpes simplex virus) infection   . Migraine   . S/P TAH (total abdominal hysterectomy) 1991    SURGICAL HISTORY: Past Surgical History:  Procedure Laterality Date  . ABDOMINAL HYSTERECTOMY    . BREAST BIOPSY     Benign.  [facility] Kitchen BREAST ENHANCEMENT SURGERY    . BREAST LUMPECTOMY Bilateral 1993 and 1995  . CESAREAN SECTION    . PARTIAL HYSTERECTOMY    . TONSILLECTOMY    . WISDOM TOOTH EXTRACTION      SOCIAL HISTORY: Social History   Socioeconomic History  . Marital status: Married    Spouse name: Not on file  . Number of children: 2  . Years of education: some college  . Highest education level: Not on file  Occupational History  . Occupation: Optometrist  Tobacco Use  . Smoking status: Former Smoker    Packs/day: 0.50    Years: 10.00    Pack years: 5.00    Types: Cigarettes    Quit date: 12/14/1993    Years since quitting: 27.1  . Smokeless tobacco: Never Used  Vaping Use  . Vaping Use: Never used  Substance and Sexual Activity  . Alcohol use: Yes    Comment: Rare  . Drug use: No  . Sexual activity: Yes    Comment: hyst  Other Topics Concern  . Not on file  Social History Narrative   Lives at home with husband.   Married 1988.   Right-handed.   4 cups caffeine per day.   Social Determinants of Health   Financial Resource Strain: Low Risk   . Difficulty of Paying Living Expenses: Not hard at all  Food Insecurity: No Food Insecurity  . Worried About Charity fundraiser in the Last Year: Never true  . Ran Out of Food in the Last Year: Never true  Transportation Needs: No Transportation Needs  . Lack of Transportation (Medical): No  . Lack of Transportation (Non-Medical): No  Physical Activity: Not on file  Stress: Not on file  Social Connections: Not on file  Intimate Partner Violence: Not on file    FAMILY HISTORY: Family History  Problem Relation Age of Onset  . Dementia Maternal Grandmother   . Colon cancer Maternal  Grandmother   . Heart disease Maternal Grandfather   . Colon cancer Maternal Grandfather        dx over 65  . Colon cancer Father        d. 35  . Heart disease Mother   . Colon cancer Mother        dx late 62s-70s  . Rectal cancer Mother 86  . Diabetes Brother   . Colon polyps Brother   . Diabetes Brother   . Colon polyps Brother   . Lymphoma Maternal Aunt   . Heart disease Maternal Uncle   . Crohn's disease Daughter   . Esophageal cancer Neg Hx   . Stomach cancer Neg Hx   . Breast cancer Neg Hx     ALLERGIES:  is allergic to ibuprofen, omnicef [cefdinir], proton pump inhibitors, and vioxx [rofecoxib].  MEDICATIONS:  Current Outpatient Medications  Medication Sig Dispense Refill  . acetaminophen (TYLENOL) 500 MG tablet Take 1,000 mg by mouth every 6 (six) hours as needed.    . Calcium 600-200 MG-UNIT tablet Take 1 tablet by mouth daily.    . calcium carbonate (TUMS - DOSED IN MG ELEMENTAL CALCIUM) 500 MG chewable tablet Chew 1 tablet by mouth as needed for indigestion or heartburn. Takes 5-6 per day    . Multiple Vitamin (MULTIVITAMIN) tablet Take 1 tablet by mouth daily.    . valACYclovir (VALTREX) 500 MG tablet Take 1 tablet (500 mg total) by mouth 2 (two) times daily as needed.    . vitamin B-12 (CYANOCOBALAMIN) 1000 MCG tablet Take 1 tablet (1,000 mcg total) by mouth daily.     No current facility-administered medications for this visit.    PHYSICAL EXAMINATION: ECOG PERFORMANCE STATUS: 0 - Asymptomatic  Vitals:   01/22/21 0835  BP: 133/72  Pulse: 93  Resp: 18  Temp: 97.7 F (36.5 C)  SpO2: 98%   Filed Weights   01/22/21 0835  Weight: 198 lb 8 oz (90 kg)    GENERAL:alert, no distress and comfortable SKIN: skin color, texture, turgor are normal, no rashes or significant lesions (+) palpable soft tissue fullness in inferior left lateral thigh, likely lipoma.   EYES: normal, Conjunctiva are pink and non-injected, sclera clear  NECK: supple, thyroid normal  size, non-tender, without nodularity LYMPH:  no palpable lymphadenopathy in the cervical, axillary  LUNGS: clear to auscultation and percussion with normal breathing effort HEART: regular rate & rhythm and no murmurs and no lower extremity edema ABDOMEN:abdomen soft, non-tender and normal bowel sounds Musculoskeletal:no cyanosis of digits and no clubbing  NEURO: alert &  oriented x 3 with fluent speech, no focal motor/sensory deficits in arms and legs.  BREAST: S/p b/l lumpectomy and b/l implant placement. (+) Mild skin ecchymosis of left breast at biopsy site. No palpable mass or adenopathy bilaterally.   LABORATORY DATA:  I have reviewed the data as listed CBC Latest Ref Rng & Units 01/22/2021 02/12/2020 10/24/2019  WBC 4.0 - 10.5 K/uL 4.4 5.9 5.4  Hemoglobin 12.0 - 15.0 g/dL 14.1 13.9 14.7  Hematocrit 36.0 - 46.0 % 42.4 41.3 44.1  Platelets 150 - 400 K/uL 230 174.0 205.0    CMP Latest Ref Rng & Units 01/22/2021 08/21/2020 02/12/2020  Glucose 70 - 99 mg/dL 89 - 93  BUN 8 - 23 mg/dL 17 - 16  Creatinine 0.44 - 1.00 mg/dL 0.83 - 0.88  Sodium 135 - 145 mmol/L 142 - 142  Potassium 3.5 - 5.1 mmol/L 4.0 - 4.1  Chloride 98 - 111 mmol/L 108 - 106  CO2 22 - 32 mmol/L 27 - 27  Calcium 8.9 - 10.3 mg/dL 9.5 - 9.6  Total Protein 6.5 - 8.1 g/dL 7.4 6.8 6.8  Total Bilirubin 0.3 - 1.2 mg/dL 0.4 - 0.4  Alkaline Phos 38 - 126 U/L 101 - 59  AST 15 - 41 U/L 26 - 22  ALT 0 - 44 U/L 28 - 18     RADIOGRAPHIC STUDIES: I have personally reviewed the radiological images as listed and agreed with the findings in the report. No results found.  ASSESSMENT & PLAN:  Aimee Mcclain is a 65 y.o. Caucasian female with H/o b/l implant placement, S/p B/l lumpectomy (benign)   1. Malignant neoplasm of upper-inner quadrant of left breast, Stage IB, c(T1cN1M0), ER+/PR+/HER2+, Grade III  -We discussed her image findings and the biopsy results in great details. She has 1.9cm mass in the left breast found to be invasive  ductal carcinoma. Her abnormal LN on [place] but biopsy was negative which is likely missed biopsy.   -MRI breast is recommended given her HER2 positive disease to further evaluate her tumor size, axillary nodes and rule out multifocal disease. Will likely hold repeat LN Biopsy if MRI is overall conclusive, based on tumor board discussion this morning.  -Given the early disease, she is a candidate for lumpectomy with Sentinel LN Biopsy. She is agreeable with that. She was seen by Dr. Barry Dienes today to discuss this.  -If she does have positive LN, I would recommend CT CAP and bone scan to evaluate for distant metastasis.  -The risk of recurrence depends on the stage and biology of the tumor. She is early stage, with ER/PR/HER2 positive. I discussed this is the more agressive disease given HER2 positive disease and high grade, she has higher risk of recurrence.  -To reduce her risk of recurrence after surgery, I recommend chemotherapy along with anti-HER2 treatment. I discussed if her LN presents positive on MRI breast, I would recommend Neo-adjuvant chemo with TCHP q3weeks for 6 cycles to downstage her cancer before surgery. If she has LN negative disease, I recommend adjuvant chemo with weekly Taxol and Herceptin for 12 weeks after surgery.  -I discussed with either chemo option, I would recommend she continue anti-HER2 Herceptin q3weeks to complete 1 year of treatment.   -I discussed the logistics and side effect of the above treatment in detail.  She is interested in chemo. -I discussed education class before treatment and I discussed the option of Dignicap and PAC placement. She will think about it.  -She was  also seen by radiation oncologist Dr. Isidore Moos today. Adjuvant radiation is recommended after lumpectomy to reduce her risk of local recurrence.   -Given the strong ER and PR expression in her postmenopausal status, I recommend adjuvant endocrine therapy with aromatase inhibitor for a total of 5-10 years to  reduce the risk of cancer recurrence. Potential benefits and side effects were discussed with patient and she is interested. -We also discussed the breast cancer surveillance after her surgery. She will continue annual screening mammogram, self exam, and a routine office visit with lab and exam with [place]. -She had no palpable mass or LN on exam today. She likely has a lipoma in left lateral thigh. Has been stable for years, overall benign. CBC and CMP WNL today.  -F/u open    2. Hot Flashes, Hormonal replacement  -After early menopause in her early 39s, she started hormonal replacement years later.  -She has been on Estrogen patch (5 years), Estrodial (90 days), vagifim tabs for 5 years. She overall stopped after breast cancer diagnosis in 12/2020.  -I advised her to continue cessation of HR and to not restart as her breast cancer is sensitive to hormones. She voiced good understanding.  -Her hot flashes returned after stopping HR. I discussed SSRI such as effexor. She declined for now.   3. Headaches -She notes she has had chronic migraines that did stop previously but returned in frontal and occipital lobes every 1-2 days in the past 2-3 months. This has been manageable with Tylenol. She denies vision change, weaknesses or numbness.  -She has known neuropathy in her feet, etiology unknown. She continues to f/u with Dr Krista Blue.  -I will obtain MRI brain which was discussed by Dr Krista Blue before. She is agreeable.    PLAN:  -Brain MRI next week -MRI breast tomorrow  -I will call her after breast MRI    Orders Placed This Encounter  Procedures  . MR BREAST BILATERAL W WO CONTRAST INC CAD    BCBS NOT WAKE // Epic  PF @ [facility] 06/17/20 / BREAST CA //BREAST IMPLANTS WT 198 / HT 5'7 / NO NEEDS / NO CLAUS / NO METAL IN EYES/REMOVED FROM EYES/NO BULLETS OR BB'S / NO IMPLANTS IN NO METAL IN EYES/REMOVED FROM EYES/NO BULLETS OR BB'S / NO IMPLANTS IN BODY INTERNALLY/EXTERNALLY / NO GLUCOSE MONITOR , SPINAL  STIMULATOR OR INJECTORS / NO SX TO BRAIN HEART EYES OR EARS / SB W/DAWN @ [facility]     Standing Status:   Future    Standing Expiration Date:   01/22/2022    Order Specific Question:   If indicated for the ordered procedure, I authorize the administration of contrast media per Radiology protocol    Answer:   Yes    Order Specific Question:   What is the patient's sedation requirement?    Answer:   No Sedation    Order Specific Question:   Does the patient have a pacemaker or implanted devices?    Answer:   No    Order Specific Question:   Radiology Contrast Protocol - do NOT remove file path    Answer:   [email or web address]    Order Specific Question:   Preferred imaging location?    Answer:   [facility] (table limit-550lbs)  . MR Brain W Wo Contrast    Rule out brain metastasis    Standing Status:   Future    Standing Expiration Date:   01/22/2022    Order Specific Question:  If indicated for the ordered procedure, I authorize the administration of contrast media per Radiology protocol    Answer:   Yes    Order Specific Question:   What is the patient's sedation requirement?    Answer:   No Sedation    Order Specific Question:   Does the patient have a pacemaker or implanted devices?    Answer:   No    Order Specific Question:   Use SRS Protocol?    Answer:   No    Order Specific Question:   Preferred imaging location?    Answer:   [facility] (table limit-550lbs)    All questions were answered. The patient knows to call the clinic with any problems, questions or concerns. The total time spent in the appointment was 60 minutes.     Truitt Merle, MD 01/22/2021 8:07 PM  I, Joslyn Devon, am acting as scribe for Truitt Merle, MD.   I have reviewed the above documentation for accuracy and completeness, and I agree with the above.

## 2021-01-23 ENCOUNTER — Ambulatory Visit
Admission: RE | Admit: 2021-01-23 | Discharge: 2021-01-23 | Disposition: A | Discharge status: Home / Self Care | Payer: BC Managed Care – PPO | Source: Ambulatory Visit | Attending: Hematology | Admitting: Hematology

## 2021-01-23 DIAGNOSIS — Z170 Estrogen receptor positive status [ER+]: Secondary | ICD-10-CM

## 2021-01-23 DIAGNOSIS — N6311 Unspecified lump in the right breast, upper outer quadrant: Secondary | ICD-10-CM | POA: Diagnosis not present

## 2021-01-23 DIAGNOSIS — C50212 Malignant neoplasm of upper-inner quadrant of left female breast: Secondary | ICD-10-CM

## 2021-01-23 MED ORDER — GADOBUTROL 1 MMOL/ML IV SOLN
9.0000 mL | Freq: Once | INTRAVENOUS | Status: AC | PRN
  Administered 2021-01-23: 9 mL via INTRAVENOUS

## 2021-01-24 ENCOUNTER — Encounter: Payer: Self-pay | Admitting: *Deleted

## 2021-01-27 ENCOUNTER — Telehealth: Payer: Self-pay | Admitting: Hematology

## 2021-01-27 ENCOUNTER — Encounter: Payer: Self-pay | Admitting: *Deleted

## 2021-01-27 ENCOUNTER — Telehealth: Payer: Self-pay | Admitting: *Deleted

## 2021-01-27 NOTE — Telephone Encounter (Signed)
I called pt and discussed her breast MRI findings. It showed the know left breast mass measuring 2.9cm mass (slightly bigger than [place] measurement), no other new lesions, axillary nodes appear to be normal. I have discussed with Dr. Barry Dienes and we feel it's better to have surgery first, so we can have accurate staging and decide her chemo regimen. I discussed the above with pt and she agrees with the plan. Dr. Marlowe Aschoff office will contact her about her surgery scheduling. She has brain MRI scheduled for 2/27, I will cal her after scan.   She will have port placement during her breast surgery.  I will see her back 2-3 weeks after surgery.  Truitt Merle  01/27/2021

## 2021-01-27 NOTE — Telephone Encounter (Signed)
Spoke to pt concerning [place] from 2.9.22. Denies questions or concerns regarding dx or treatment care plan. Encourage pt to call with needs. Received verbal understanding.

## 2021-01-28 ENCOUNTER — Encounter: Payer: Self-pay | Admitting: *Deleted

## 2021-01-29 ENCOUNTER — Telehealth: Payer: Self-pay | Admitting: Hematology

## 2021-01-29 NOTE — Telephone Encounter (Signed)
Called pt per 2/15 sch msg - pt is aware of appt date and time

## 2021-01-30 ENCOUNTER — Encounter: Payer: Self-pay | Admitting: *Deleted

## 2021-02-06 ENCOUNTER — Encounter (HOSPITAL_BASED_OUTPATIENT_CLINIC_OR_DEPARTMENT_OTHER): Payer: Self-pay | Admitting: General Surgery

## 2021-02-06 ENCOUNTER — Other Ambulatory Visit: Payer: Self-pay

## 2021-02-07 NOTE — H&P (Signed)
[place] Appointment: 01/22/2021 9:00 AM Location: [facility] Patient #: [id number] DOB: 01-19-1956 Undefined / Language: Aimee Mcclain / Race: White Female   History of Present Illness Aimee Klein MD; 01/22/2021 12:05 PM) The patient is a 65 year old female who presents with breast cancer.Pt is a lovely 65 yo F referred for consultation by Dr. Luan Pulling for a new diagnosis of breast cancer 01/13/2021. She had a screening detected left breast mass. Diagnostic imaging confirmed this and showed a 1.8 cm mass at 11 o'clock as well as a suspicious left axillary lymph node. Core needle biopsies were performed. 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 Ki 67 is 30%. The lymph node biopsy was negative, but did not contain any lymphoid tissue and was considered discordant.   Films are at [place]. They are described above. Images and reports as well as pathology reviewed in multidisciplinary fashion.   Pt has bilateral saline breast implants.   She has no personal history of cancer before this diagnosis. She does have multiple family members on her mother's side with colon cancer, but they were diagnosed at age 84, 66, and 32.  She smoked in the past but quite 25 years ago. She drinks alcohol rarely. Menarche was age 38, She used hormone replacement for several years, but is only using vaginal estrogen now. She is a Advertising copywriter. She is up to date wtih her colonoscopy and bone density study.   pathology 01/13/2021 Diagnosis 1. Breast, left, needle core biopsy, 11 o'clock, 4cmfn - INVASIVE DUCTAL CARCINOMA - SEE COMMENT The tumor cells are POSITIVE for Her2 (3+). Estrogen Receptor: 95%, POSITIVE, STRONG STAINING INTENSITY Progesterone Receptor: 85%, POSITIVE, STRONG STAINING INTENSITY Proliferation Marker Ki67: 30% 2. Lymph node, needle/core biopsy, left axilla - FIBROVASCULAR AND ADIPOSE TISSUE - NO LYMPHOID TISSUE OR CARCINOMA  IDENTIFIED Microscopic Comment 1. Based on the biopsy, the carcinoma appears Nottingham grade 3 of 3 and measures 1.2 cm in greatest linear extent  CBC and CMET normal 01/22/2021   Past Surgical History Aimee Slipper, RN; 01/22/2021 8:10 AM) Breast Augmentation  Bilateral. Breast Biopsy  Left. Breast Mass; Local Excision  Bilateral. Cataract Surgery  Bilateral. Cesarean Section - 1  Hysterectomy (not due to cancer) - Complete  Tonsillectomy   Diagnostic Studies History Aimee Slipper, RN; 01/22/2021 8:10 AM) Colonoscopy  1-5 years ago Mammogram  within last year Pap Smear  1-5 years ago  Medication History Aimee Slipper, RN; 01/22/2021 8:10 AM) Medications Reconciled  Social History Aimee Slipper, RN; 01/22/2021 8:10 AM) Alcohol use  Occasional alcohol use. Caffeine use  Coffee. No drug use  Tobacco use  Former smoker.  Family History Aimee Slipper, RN; 01/22/2021 8:10 AM) Cerebrovascular Accident  Family Members In General. Colon Cancer  Family Members In General, Father, Mother. Diabetes Mellitus  Brother. Heart Disease  Family Members In General, Mother. Hypertension  Family Members In General, Mother. Migraine Headache  Mother. Rectal Cancer  Mother. Respiratory Condition  Family Members In General. Thyroid problems  Family Members In General.  Pregnancy / Birth History Aimee Slipper, RN; 01/22/2021 8:10 AM) Age at menarche  31 years. Age of menopause  <45 Gravida  2 Length (months) of breastfeeding  3-6 Maternal age  52-25 Para  2  Other Problems Aimee Slipper, RN; 01/22/2021 8:10 AM) Gastroesophageal Reflux Disease  Hemorrhoids  Lump In Breast  Migraine Headache     Review of Systems Aimee Klein MD; 01/22/2021 11:43 AM)  General Not Present- Appetite Loss, Chills, Fatigue, Fever, Night Sweats, Weight Gain and Weight Loss. Skin Not Present- Change in Wart/Mole, Dryness, Hives, Jaundice, New Lesions, Non-Healing Wounds, Rash and Ulcer. HEENT  Present- Ringing in the Ears. Not Present- Earache, Hearing Loss, Hoarseness, Nose Bleed, Oral Ulcers, Seasonal Allergies, Sinus Pain, Sore Throat, Visual Disturbances, Wears glasses/contact lenses and Yellow Eyes. Respiratory Not Present- Bloody sputum, Chronic Cough, Difficulty Breathing, Snoring and Wheezing. Breast Present- Breast Mass. Not Present- Breast Pain, Nipple Discharge and Skin Changes. Cardiovascular Present- Palpitations, Rapid Heart Rate and Shortness of Breath. Not Present- Chest Pain, Difficulty Breathing Lying Down, Leg Cramps and Swelling of Extremities. Gastrointestinal Present- Constipation, Hemorrhoids and Indigestion. Not Present- Abdominal Pain, Bloating, Bloody Stool, Change in Bowel Habits, Chronic diarrhea, Difficulty Swallowing, Excessive gas, Gets full quickly at meals, Nausea, Rectal Pain and Vomiting. Female Genitourinary Not Present- Frequency, Nocturia, Painful Urination, Pelvic Pain and Urgency. Musculoskeletal Not Present- Back Pain, Joint Pain, Joint Stiffness, Muscle Pain, Muscle Weakness and Swelling of Extremities. Neurological Present- Headaches. Not Present- Decreased Memory, Fainting, Numbness, Seizures, Tingling, Tremor, Trouble walking and Weakness. Psychiatric Not Present- Anxiety, Bipolar, Change in Sleep Pattern, Depression, Fearful and Frequent crying. Endocrine Present- Hot flashes. Not Present- Cold Intolerance, Excessive Hunger, Hair Changes, Heat Intolerance and New Diabetes. Hematology Present- Easy Bruising. Not Present- Blood Thinners, Excessive bleeding, Gland problems, HIV and Persistent Infections. All other systems negative  Vitals Aimee Klein MD; 01/22/2021 11:37 AM) 01/22/2021 11:36 AM Weight: 198.5 lb Temp.: 97.42F  Pulse: 93 (Regular)  Resp.: 18 (Unlabored)  BP: 133/72(Sitting, Left Arm, Standard)       Physical Exam Aimee Klein MD; 01/22/2021 11:45 AM) General Mental Status-Alert. General Appearance-Consistent  with stated age. Hydration-Well hydrated. Voice-Normal.  Head and Neck Head-normocephalic, atraumatic with no lesions or palpable masses. Trachea-midline. Thyroid Gland Characteristics - normal size and consistency.  Eye Eyeball - Bilateral-Extraocular movements intact. Sclera/Conjunctiva - Bilateral-No scleral icterus.  Chest and Lung Exam Chest and lung exam reveals -quiet, even and easy respiratory effort with no use of accessory muscles and on auscultation, normal breath sounds, no adventitious sounds and normal vocal resonance. Inspection Chest Wall - Normal. Back - normal.  Breast Note: breasts relatively symmetric. No palpable masses. Upper left breast with faint bruising. No nipple retraction or lymphadenopathy. implants displace laterally on both sides when she is supine. no nipple discharge or skin dimpling.   Cardiovascular Cardiovascular examination reveals -normal heart sounds, regular rate and rhythm with no murmurs and normal pedal pulses bilaterally.  Abdomen Inspection Inspection of the abdomen reveals - No Hernias. Palpation/Percussion Palpation and Percussion of the abdomen reveal - Soft, Non Tender, No Rebound tenderness, No Rigidity (guarding) and No hepatosplenomegaly. Auscultation Auscultation of the abdomen reveals - Bowel sounds normal.  Neurologic Neurologic evaluation reveals -alert and oriented x 3 with no impairment of recent or remote memory. Mental Status-Normal.  Musculoskeletal Global Assessment -Note: no gross deformities.  Normal Exam - Left-Upper Extremity Strength Normal and Lower Extremity Strength Normal. Normal Exam - Right-Upper Extremity Strength Normal and Lower Extremity Strength Normal.  Lymphatic Head & Neck  General Head & Neck Lymphatics: Bilateral - Description - Normal. Axillary  General Axillary Region: Bilateral - Description - Normal. Tenderness - Non Tender. Femoral &  Inguinal  Generalized Femoral & Inguinal Lymphatics: Bilateral - Description - No Generalized lymphadenopathy.    Assessment & Plan Aimee Klein MD; 01/22/2021 12:05 PM) MALIGNANT NEOPLASM OF UPPER-INNER QUADRANT OF LEFT BREAST IN FEMALE, ESTROGEN RECEPTOR POSITIVE (C50.212) Impression: Pt has  a history of new left breast cancer, cT1cN?. given that this is her 2 positive and there is LN discrepancy, will plan breast MRI, which is scheduled tomorrow. i discussed that breast MRI is very sensitive and there is high likelihood of needing additional biopsies. This may change our surgical plan. If the lymph node appears more concerning, would plan neoadjuvant chemo followed by seed localized lumpectomy and SLN bx. If the node is equivocal, will plan surgery up front with port at the same time. Husband is present for discussion.  I discussed that for lumpectomy to be equal in survival to a mastectomy that radiation is needed. I discussed that mastectomy is an option, but she would still be recommended to receive XRT if she has a positive node or if mastectomy margin is positive.  I discussed that surgery is still indicated even it it looks like the tumor goes away completely on imaging.  The surgical procedure was described to the patient. I discussed the incision type and location and that we would need radiology involved on with a wire or seed marker and/or sentinel node.  The risks and benefits of the procedure were described to the patient and she wishes to proceed.  We discussed the risks bleeding, infection, damage to other structures, need for further procedures/surgeries. We discussed the risk of seroma. The patient was advised if the area in the breast in cancer, we may need to go back to surgery for additional tissue to obtain negative margins or for a lymph node biopsy. The patient was advised that these are the most common complications, but that others can occur as well. They were advised  against taking aspirin or other anti-inflammatory agents/blood thinners the week before surgery.  Orders in Epic have been written pending MRI. H/O BILATERAL BREAST IMPLANTS (Z98.82) Impression: I discussed that there is a risk of damage to the implants both during surgery and in the post op time period as there are multiple instances when needles are stuck into the breast.

## 2021-02-09 ENCOUNTER — Ambulatory Visit
Admission: RE | Admit: 2021-02-09 | Discharge: 2021-02-09 | Disposition: A | Discharge status: Home / Self Care | Payer: BC Managed Care – PPO | Source: Ambulatory Visit | Attending: Hematology | Admitting: Hematology

## 2021-02-09 DIAGNOSIS — Z170 Estrogen receptor positive status [ER+]: Secondary | ICD-10-CM

## 2021-02-09 DIAGNOSIS — R519 Headache, unspecified: Secondary | ICD-10-CM | POA: Diagnosis not present

## 2021-02-09 DIAGNOSIS — C50919 Malignant neoplasm of unspecified site of unspecified female breast: Secondary | ICD-10-CM | POA: Diagnosis not present

## 2021-02-09 DIAGNOSIS — C50212 Malignant neoplasm of upper-inner quadrant of left female breast: Secondary | ICD-10-CM

## 2021-02-09 MED ORDER — GADOBENATE DIMEGLUMINE 529 MG/ML IV SOLN
18.0000 mL | Freq: Once | INTRAVENOUS | Status: AC | PRN
  Administered 2021-02-09: 18 mL via INTRAVENOUS

## 2021-02-10 ENCOUNTER — Other Ambulatory Visit (HOSPITAL_COMMUNITY)
Admission: RE | Admit: 2021-02-10 | Discharge: 2021-02-10 | Disposition: A | Discharge status: Home / Self Care | Payer: BC Managed Care – PPO | Source: Ambulatory Visit | Attending: General Surgery | Admitting: General Surgery

## 2021-02-10 DIAGNOSIS — Z01812 Encounter for preprocedural laboratory examination: Secondary | ICD-10-CM | POA: Insufficient documentation

## 2021-02-10 DIAGNOSIS — Z20822 Contact with and (suspected) exposure to covid-19: Secondary | ICD-10-CM | POA: Insufficient documentation

## 2021-02-10 DIAGNOSIS — Z87891 Personal history of nicotine dependence: Secondary | ICD-10-CM | POA: Diagnosis not present

## 2021-02-10 DIAGNOSIS — C50212 Malignant neoplasm of upper-inner quadrant of left female breast: Secondary | ICD-10-CM | POA: Diagnosis not present

## 2021-02-10 DIAGNOSIS — Z170 Estrogen receptor positive status [ER+]: Secondary | ICD-10-CM | POA: Diagnosis not present

## 2021-02-10 LAB — SARS CORONAVIRUS 2 (TAT 6-24 HRS): SARS Coronavirus 2: NEGATIVE

## 2021-02-12 ENCOUNTER — Encounter: Payer: Self-pay | Admitting: Hematology

## 2021-02-12 DIAGNOSIS — R590 Localized enlarged lymph nodes: Secondary | ICD-10-CM | POA: Diagnosis not present

## 2021-02-12 DIAGNOSIS — C50212 Malignant neoplasm of upper-inner quadrant of left female breast: Secondary | ICD-10-CM | POA: Diagnosis not present

## 2021-02-12 MED ORDER — CHLORHEXIDINE GLUCONATE CLOTH 2 % EX PADS: 6.0000 | MEDICATED_PAD | Freq: Once | CUTANEOUS | Status: DC

## 2021-02-12 MED ORDER — ENSURE PRE-SURGERY PO LIQD: 296.0000 mL | Freq: Once | ORAL | Status: DC

## 2021-02-12 NOTE — Progress Notes (Signed)
      Enhanced Recovery after Surgery for Orthopedics Enhanced Recovery after Surgery is a protocol used to improve the stress on your body and your recovery after surgery.  Patient Instructions  . The night before surgery:  o No food after midnight. ONLY clear liquids after midnight  . The day of surgery (if you do NOT have diabetes):  o Drink ONE (1) Pre-Surgery Clear Ensure as directed.   o This drink was given to you during your hospital  pre-op appointment visit. o The pre-op nurse will instruct you on the time to drink the  Pre-Surgery Ensure depending on your surgery time. o Finish the drink at the designated time by the pre-op nurse.  o Nothing else to drink after completing the  Pre-Surgery Clear Ensure.  . The day of surgery (if you have diabetes): o Drink ONE (1) Gatorade 2 (G2) as directed. o This drink was given to you during your hospital  pre-op appointment visit.  o The pre-op nurse will instruct you on the time to drink the   Gatorade 2 (G2) depending on your surgery time. o Color of the Gatorade may vary. Red is not allowed. o Nothing else to drink after completing the  Gatorade 2 (G2).         If you have questions, please contact your surgeon's office.   Surgical soap given to patient with instructions. Patient verbalizes understanding. 

## 2021-02-13 ENCOUNTER — Encounter (HOSPITAL_BASED_OUTPATIENT_CLINIC_OR_DEPARTMENT_OTHER)
Admission: RE | Disposition: A | Discharge status: Home / Self Care | Payer: Self-pay | Source: Home / Self Care | Attending: General Surgery

## 2021-02-13 ENCOUNTER — Ambulatory Visit (HOSPITAL_COMMUNITY)
Admission: RE | Admit: 2021-02-13 | Discharge: 2021-02-13 | Disposition: A | Discharge status: Home / Self Care | Payer: BC Managed Care – PPO | Source: Ambulatory Visit | Attending: General Surgery | Admitting: General Surgery

## 2021-02-13 ENCOUNTER — Ambulatory Visit (HOSPITAL_BASED_OUTPATIENT_CLINIC_OR_DEPARTMENT_OTHER)
Admission: RE | Admit: 2021-02-13 | Discharge: 2021-02-13 | Disposition: A | Discharge status: Home / Self Care | Payer: BC Managed Care – PPO | Attending: General Surgery | Admitting: General Surgery

## 2021-02-13 ENCOUNTER — Encounter (HOSPITAL_BASED_OUTPATIENT_CLINIC_OR_DEPARTMENT_OTHER): Payer: Self-pay | Admitting: General Surgery

## 2021-02-13 ENCOUNTER — Ambulatory Visit (HOSPITAL_COMMUNITY): Payer: BC Managed Care – PPO

## 2021-02-13 ENCOUNTER — Ambulatory Visit (HOSPITAL_BASED_OUTPATIENT_CLINIC_OR_DEPARTMENT_OTHER): Payer: BC Managed Care – PPO | Admitting: Anesthesiology

## 2021-02-13 ENCOUNTER — Encounter (HOSPITAL_COMMUNITY): Payer: BC Managed Care – PPO

## 2021-02-13 ENCOUNTER — Other Ambulatory Visit: Payer: Self-pay

## 2021-02-13 DIAGNOSIS — C50212 Malignant neoplasm of upper-inner quadrant of left female breast: Secondary | ICD-10-CM | POA: Diagnosis not present

## 2021-02-13 DIAGNOSIS — G43909 Migraine, unspecified, not intractable, without status migrainosus: Secondary | ICD-10-CM | POA: Diagnosis not present

## 2021-02-13 DIAGNOSIS — Z20822 Contact with and (suspected) exposure to covid-19: Secondary | ICD-10-CM | POA: Insufficient documentation

## 2021-02-13 DIAGNOSIS — Z95828 Presence of other vascular implants and grafts: Secondary | ICD-10-CM

## 2021-02-13 DIAGNOSIS — Z87891 Personal history of nicotine dependence: Secondary | ICD-10-CM | POA: Diagnosis not present

## 2021-02-13 DIAGNOSIS — K219 Gastro-esophageal reflux disease without esophagitis: Secondary | ICD-10-CM | POA: Diagnosis not present

## 2021-02-13 DIAGNOSIS — Z170 Estrogen receptor positive status [ER+]: Secondary | ICD-10-CM | POA: Insufficient documentation

## 2021-02-13 DIAGNOSIS — R590 Localized enlarged lymph nodes: Secondary | ICD-10-CM | POA: Diagnosis not present

## 2021-02-13 DIAGNOSIS — C50912 Malignant neoplasm of unspecified site of left female breast: Secondary | ICD-10-CM | POA: Diagnosis not present

## 2021-02-13 HISTORY — PX: PORTACATH PLACEMENT: SHX2246

## 2021-02-13 HISTORY — PX: BREAST LUMPECTOMY WITH RADIOACTIVE SEED AND SENTINEL LYMPH NODE BIOPSY: SHX6550

## 2021-02-13 SURGERY — BREAST LUMPECTOMY WITH RADIOACTIVE SEED AND SENTINEL LYMPH NODE BIOPSY
Anesthesia: General | Site: Breast | Laterality: Right

## 2021-02-13 MED ORDER — BUPIVACAINE-EPINEPHRINE (PF) 0.25% -1:200000 IJ SOLN
INTRAMUSCULAR | Status: DC | PRN
  Administered 2021-02-13: 30 mL

## 2021-02-13 MED ORDER — CIPROFLOXACIN IN D5W 400 MG/200ML IV SOLN
400.0000 mg | INTRAVENOUS | Status: AC
  Administered 2021-02-13: 400 mg via INTRAVENOUS

## 2021-02-13 MED ORDER — ACETAMINOPHEN 500 MG PO TABS
1000.0000 mg | ORAL_TABLET | ORAL | Status: AC
  Administered 2021-02-13: 1000 mg via ORAL

## 2021-02-13 MED ORDER — HEPARIN (PORCINE) IN NACL 2-0.9 UNITS/ML
INTRAMUSCULAR | Status: AC | PRN
  Administered 2021-02-13: 1 via INTRAVENOUS

## 2021-02-13 MED ORDER — FENTANYL CITRATE (PF) 100 MCG/2ML IJ SOLN
INTRAMUSCULAR | Status: DC | PRN
  Administered 2021-02-13 (×4): 25 ug via INTRAVENOUS

## 2021-02-13 MED ORDER — LACTATED RINGERS IV SOLN: INTRAVENOUS | Status: DC

## 2021-02-13 MED ORDER — FENTANYL CITRATE (PF) 100 MCG/2ML IJ SOLN
INTRAMUSCULAR | Status: AC
  Filled 2021-02-13: qty 2

## 2021-02-13 MED ORDER — CIPROFLOXACIN IN D5W 400 MG/200ML IV SOLN
INTRAVENOUS | Status: AC
  Filled 2021-02-13: qty 200

## 2021-02-13 MED ORDER — HEPARIN SOD (PORK) LOCK FLUSH 100 UNIT/ML IV SOLN
INTRAVENOUS | Status: DC | PRN
  Administered 2021-02-13: 500 [IU]

## 2021-02-13 MED ORDER — AMISULPRIDE (ANTIEMETIC) 5 MG/2ML IV SOLN: 10.0000 mg | Freq: Once | INTRAVENOUS | Status: DC | PRN

## 2021-02-13 MED ORDER — SODIUM CHLORIDE (PF) 0.9 % IJ SOLN
INTRAMUSCULAR | Status: AC
  Filled 2021-02-13: qty 10

## 2021-02-13 MED ORDER — PHENYLEPHRINE 40 MCG/ML (10ML) SYRINGE FOR IV PUSH (FOR BLOOD PRESSURE SUPPORT)
PREFILLED_SYRINGE | INTRAVENOUS | Status: AC
  Filled 2021-02-13: qty 20

## 2021-02-13 MED ORDER — PROPOFOL 10 MG/ML IV BOLUS
INTRAVENOUS | Status: DC | PRN
  Administered 2021-02-13: 200 mg via INTRAVENOUS

## 2021-02-13 MED ORDER — TECHNETIUM TC 99M TILMANOCEPT KIT
1.0000 | PACK | Freq: Once | INTRAVENOUS | Status: AC | PRN
  Administered 2021-02-13: 1 via INTRADERMAL

## 2021-02-13 MED ORDER — HEPARIN SOD (PORK) LOCK FLUSH 100 UNIT/ML IV SOLN
INTRAVENOUS | Status: AC
  Filled 2021-02-13: qty 5

## 2021-02-13 MED ORDER — ACETAMINOPHEN 500 MG PO TABS
ORAL_TABLET | ORAL | Status: AC
  Filled 2021-02-13: qty 2

## 2021-02-13 MED ORDER — HEPARIN (PORCINE) IN NACL 1000-0.9 UT/500ML-% IV SOLN
INTRAVENOUS | Status: AC
  Filled 2021-02-13: qty 500

## 2021-02-13 MED ORDER — MIDAZOLAM HCL 2 MG/2ML IJ SOLN
2.0000 mg | Freq: Once | INTRAMUSCULAR | Status: AC
  Administered 2021-02-13: 2 mg via INTRAVENOUS

## 2021-02-13 MED ORDER — PROPOFOL 500 MG/50ML IV EMUL
INTRAVENOUS | Status: DC | PRN
  Administered 2021-02-13: 25 ug/kg/min via INTRAVENOUS

## 2021-02-13 MED ORDER — MIDAZOLAM HCL 2 MG/2ML IJ SOLN
INTRAMUSCULAR | Status: AC
  Filled 2021-02-13: qty 2

## 2021-02-13 MED ORDER — ONDANSETRON HCL 4 MG/2ML IJ SOLN
INTRAMUSCULAR | Status: DC | PRN
  Administered 2021-02-13 (×2): 4 mg via INTRAVENOUS

## 2021-02-13 MED ORDER — METHYLENE BLUE 0.5 % INJ SOLN
INTRAVENOUS | Status: AC
  Filled 2021-02-13: qty 10

## 2021-02-13 MED ORDER — OXYCODONE HCL 5 MG PO TABS: 5.0000 mg | ORAL_TABLET | Freq: Four times a day (QID) | ORAL | 0 refills | Status: DC | PRN

## 2021-02-13 MED ORDER — PHENYLEPHRINE 40 MCG/ML (10ML) SYRINGE FOR IV PUSH (FOR BLOOD PRESSURE SUPPORT)
PREFILLED_SYRINGE | INTRAVENOUS | Status: AC
  Filled 2021-02-13: qty 10

## 2021-02-13 MED ORDER — PROPOFOL 10 MG/ML IV BOLUS
INTRAVENOUS | Status: AC
  Filled 2021-02-13: qty 20

## 2021-02-13 MED ORDER — DEXAMETHASONE SODIUM PHOSPHATE 4 MG/ML IJ SOLN
INTRAMUSCULAR | Status: DC | PRN
  Administered 2021-02-13: 10 mg via INTRAVENOUS

## 2021-02-13 MED ORDER — GABAPENTIN 100 MG PO CAPS
ORAL_CAPSULE | ORAL | Status: AC
  Filled 2021-02-13: qty 1

## 2021-02-13 MED ORDER — FENTANYL CITRATE (PF) 100 MCG/2ML IJ SOLN
50.0000 ug | Freq: Once | INTRAMUSCULAR | Status: AC
Start: 2021-02-13 — End: 2021-02-13
  Administered 2021-02-13: 50 ug via INTRAVENOUS

## 2021-02-13 MED ORDER — LIDOCAINE-EPINEPHRINE (PF) 1 %-1:200000 IJ SOLN
INTRAMUSCULAR | Status: DC | PRN
  Administered 2021-02-13: 20 mL via INTRAMUSCULAR

## 2021-02-13 MED ORDER — GABAPENTIN 100 MG PO CAPS
100.0000 mg | ORAL_CAPSULE | ORAL | Status: AC
  Administered 2021-02-13: 100 mg via ORAL

## 2021-02-13 MED ORDER — FENTANYL CITRATE (PF) 100 MCG/2ML IJ SOLN
25.0000 ug | INTRAMUSCULAR | Status: DC | PRN
  Administered 2021-02-13 (×2): 50 ug via INTRAVENOUS

## 2021-02-13 SURGICAL SUPPLY — 64 items
BAG DECANTER FOR FLEXI CONT (MISCELLANEOUS) ×4 IMPLANT
BINDER BREAST XLRG (GAUZE/BANDAGES/DRESSINGS) ×4 IMPLANT
BLADE SURG 10 STRL SS (BLADE) ×4 IMPLANT
BLADE SURG 11 STRL SS (BLADE) ×4 IMPLANT
BLADE SURG 15 STRL LF DISP TIS (BLADE) ×3 IMPLANT
BLADE SURG 15 STRL SS (BLADE) ×4
BNDG COHESIVE 4X5 TAN STRL (GAUZE/BANDAGES/DRESSINGS) ×4 IMPLANT
CANISTER SUC SOCK COL 7IN (MISCELLANEOUS) IMPLANT
CANISTER SUCT 1200ML W/VALVE (MISCELLANEOUS) ×4 IMPLANT
CHLORAPREP W/TINT 26 (MISCELLANEOUS) ×8 IMPLANT
CLIP VESOCCLUDE LG 6/CT (CLIP) ×4 IMPLANT
CLIP VESOCCLUDE MED 6/CT (CLIP) ×8 IMPLANT
COVER BACK TABLE 60X90IN (DRAPES) ×4 IMPLANT
COVER MAYO STAND STRL (DRAPES) ×4 IMPLANT
COVER PROBE W GEL 5X96 (DRAPES) ×4 IMPLANT
DECANTER SPIKE VIAL GLASS SM (MISCELLANEOUS) IMPLANT
DERMABOND ADVANCED (GAUZE/BANDAGES/DRESSINGS) ×2
DERMABOND ADVANCED .7 DNX12 (GAUZE/BANDAGES/DRESSINGS) ×6 IMPLANT
DRAPE C-ARM 42X72 X-RAY (DRAPES) ×4 IMPLANT
DRAPE LAPAROSCOPIC ABDOMINAL (DRAPES) ×4 IMPLANT
DRAPE UTILITY XL STRL (DRAPES) ×8 IMPLANT
DRSG PAD ABDOMINAL 8X10 ST (GAUZE/BANDAGES/DRESSINGS) ×4 IMPLANT
ELECT COATED BLADE 2.86 ST (ELECTRODE) ×4 IMPLANT
ELECT REM PT RETURN 9FT ADLT (ELECTROSURGICAL) ×4
ELECTRODE REM PT RTRN 9FT ADLT (ELECTROSURGICAL) ×3 IMPLANT
GAUZE SPONGE 4X4 12PLY STRL LF (GAUZE/BANDAGES/DRESSINGS) ×4 IMPLANT
GLOVE SURG ENC MOIS LTX SZ6 (GLOVE) ×4 IMPLANT
GLOVE SURG UNDER POLY LF SZ6.5 (GLOVE) ×4 IMPLANT
GOWN STRL REUS W/ TWL LRG LVL3 (GOWN DISPOSABLE) ×3 IMPLANT
GOWN STRL REUS W/TWL 2XL LVL3 (GOWN DISPOSABLE) ×4 IMPLANT
GOWN STRL REUS W/TWL LRG LVL3 (GOWN DISPOSABLE) ×3
IV CONNECTOR ONE LINK NDLESS (IV SETS) IMPLANT
KIT MARKER MARGIN INK (KITS) ×4 IMPLANT
KIT PORT POWER 8FR ISP CVUE (Port) ×4 IMPLANT
LIGHT WAVEGUIDE WIDE FLAT (MISCELLANEOUS) ×4 IMPLANT
NEEDLE HYPO 25X1 1.5 SAFETY (NEEDLE) ×4 IMPLANT
NS IRRIG 1000ML POUR BTL (IV SOLUTION) ×4 IMPLANT
PACK BASIN DAY SURGERY FS (CUSTOM PROCEDURE TRAY) ×4 IMPLANT
PACK UNIVERSAL I (CUSTOM PROCEDURE TRAY) ×4 IMPLANT
PENCIL SMOKE EVACUATOR (MISCELLANEOUS) ×4 IMPLANT
SLEEVE SCD COMPRESS KNEE MED (STOCKING) ×4 IMPLANT
SPONGE LAP 18X18 RF (DISPOSABLE) ×8 IMPLANT
STOCKINETTE IMPERVIOUS LG (DRAPES) ×4 IMPLANT
STRIP CLOSURE SKIN 1/2X4 (GAUZE/BANDAGES/DRESSINGS) ×4 IMPLANT
SUT ETHILON 2 0 FS 18 (SUTURE) IMPLANT
SUT MNCRL AB 4-0 PS2 18 (SUTURE) ×4 IMPLANT
SUT MON AB 4-0 PC3 18 (SUTURE) ×4 IMPLANT
SUT MON AB 5-0 PS2 18 (SUTURE) IMPLANT
SUT PROLENE 2 0 SH DA (SUTURE) ×8 IMPLANT
SUT SILK 2 0 SH (SUTURE) IMPLANT
SUT VIC AB 2-0 SH 18 (SUTURE) IMPLANT
SUT VIC AB 2-0 SH 27 (SUTURE) ×3
SUT VIC AB 2-0 SH 27XBRD (SUTURE) ×3 IMPLANT
SUT VIC AB 3-0 SH 27 (SUTURE)
SUT VIC AB 3-0 SH 27X BRD (SUTURE) IMPLANT
SUT VICRYL 3-0 CR8 SH (SUTURE) ×4 IMPLANT
SYR 10ML LL (SYRINGE) ×4 IMPLANT
SYR 5ML LUER SLIP (SYRINGE) ×4 IMPLANT
SYR BULB EAR ULCER 3OZ GRN STR (SYRINGE) ×4 IMPLANT
SYR CONTROL 10ML LL (SYRINGE) ×4 IMPLANT
TOWEL GREEN STERILE FF (TOWEL DISPOSABLE) ×4 IMPLANT
TRAY FAXITRON CT DISP (TRAY / TRAY PROCEDURE) ×4 IMPLANT
TUBE CONNECTING 20X1/4 (TUBING) ×4 IMPLANT
YANKAUER SUCT BULB TIP NO VENT (SUCTIONS) ×4 IMPLANT

## 2021-02-13 NOTE — Anesthesia Preprocedure Evaluation (Signed)
Anesthesia Evaluation  Patient identified by MRN, date of birth, ID band Patient awake    Reviewed: Allergy & Precautions, NPO status , Patient's Chart, lab work & pertinent test results  Airway Mallampati: II  TM Distance: >3 FB Neck ROM: Full    Dental  (+) Dental Advisory Given   Pulmonary former smoker,    breath sounds clear to auscultation       Cardiovascular negative cardio ROS   Rhythm:Regular Rate:Normal     Neuro/Psych  Headaches,    GI/Hepatic Neg liver ROS, GERD  ,  Endo/Other  negative endocrine ROS  Renal/GU negative Renal ROS     Musculoskeletal   Abdominal   Peds  Hematology negative hematology ROS (+)   Anesthesia Other Findings   Reproductive/Obstetrics                             Anesthesia Physical Anesthesia Plan  ASA: II  Anesthesia Plan: General   Post-op Pain Management:  Regional for Post-op pain   Induction: Intravenous  PONV Risk Score and Plan: 3 and Ondansetron, Dexamethasone, Propofol infusion, Midazolam and Treatment may vary due to age or medical condition  Airway Management Planned: LMA  Additional Equipment:   Intra-op Plan:   Post-operative Plan: Extubation in OR  Informed Consent: I have reviewed the patients History and Physical, chart, labs and discussed the procedure including the risks, benefits and alternatives for the proposed anesthesia with the patient or authorized representative who has indicated his/her understanding and acceptance.     Dental advisory given  Plan Discussed with: CRNA  Anesthesia Plan Comments:         Anesthesia Quick Evaluation

## 2021-02-13 NOTE — Progress Notes (Signed)
Nuclear medicine at bedside completing breast injections. Emotional support provided. Patient tolerated well. Vital signs remain stable.

## 2021-02-13 NOTE — Interval H&P Note (Signed)
History and Physical Interval Note:  02/13/2021 1:08 PM  Aimee Mcclain  has presented today for surgery, with the diagnosis of LEFT BREAST CANCER.  The various methods of treatment have been discussed with the patient and family. After consideration of risks, benefits and other options for treatment, the patient has consented to  Procedure(s) with comments: LEFT BREAST LUMPECTOMY WITH RADIOACTIVE SEED AND SENTINEL LYMPH NODE BIOPSY (Left) - RNFA INSERTION PORT-A-CATH (N/A) SEED TARGETED LYMPH NODE BIOPSY (N/A) as a surgical intervention.  The patient's history has been reviewed, patient examined, no change in status, stable for surgery.  I have reviewed the patient's chart and labs.  Questions were answered to the patient's satisfaction.     Stark Klein

## 2021-02-13 NOTE — Anesthesia Procedure Notes (Signed)
Anesthesia Regional Block: Pectoralis block   Pre-Anesthetic Checklist: ,, timeout performed, Correct Patient, Correct Site, Correct Laterality, Correct Procedure, Correct Position, site marked, Risks and benefits discussed,  Surgical consent,  Pre-op evaluation,  At surgeon's request and post-op pain management  Laterality: Left  Prep: chloraprep       Needles:  Injection technique: Single-shot  Needle Type: Echogenic Needle     Needle Length: 9cm  Needle Gauge: 21     Additional Needles:   Procedures:,,,, ultrasound used (permanent image in chart),,,,  Narrative:  Start time: 02/13/2021 12:13 PM End time: 02/13/2021 12:18 PM Injection made incrementally with aspirations every 5 mL.  Performed by: Personally  Anesthesiologist: Suzette Battiest, MD

## 2021-02-13 NOTE — Anesthesia Procedure Notes (Signed)
Procedure Name: LMA Insertion Performed by: Aislyn Hayse, Ernesta Amble, CRNA Pre-anesthesia Checklist: Patient identified, Emergency Drugs available, Suction available and Patient being monitored Patient Re-evaluated:Patient Re-evaluated prior to induction Oxygen Delivery Method: Circle System Utilized Preoxygenation: Pre-oxygenation with 100% oxygen Induction Type: IV induction Ventilation: Mask ventilation without difficulty LMA: LMA inserted LMA Size: 4.0 Number of attempts: 1 Airway Equipment and Method: bite block Placement Confirmation: positive ETCO2 Tube secured with: Tape Dental Injury: Teeth and Oropharynx as per pre-operative assessment

## 2021-02-13 NOTE — Progress Notes (Signed)
Assisted Dr. Rob Fitzgerald with left, ultrasound guided, pectoralis block. Side rails up, monitors on throughout procedure. See vital signs in flow sheet. Tolerated Procedure well. 

## 2021-02-13 NOTE — Anesthesia Postprocedure Evaluation (Signed)
Anesthesia Post Note  Patient: Aimee Mcclain  Procedure(s) Performed: LEFT BREAST LUMPECTOMY WITH RADIOACTIVE SEED AND SENTINEL LYMPH NODE BIOPSY WITH SEED TARGETED NODE (Left Breast) INSERTION PORT-A-CATH (Right Breast)     Patient location during evaluation: PACU Anesthesia Type: General Level of consciousness: awake and alert and oriented Pain management: pain level controlled Vital Signs Assessment: post-procedure vital signs reviewed and stable Respiratory status: spontaneous breathing, nonlabored ventilation and respiratory function stable Cardiovascular status: blood pressure returned to baseline Postop Assessment: no apparent nausea or vomiting Anesthetic complications: no   No complications documented.  Last Vitals:  Vitals:   02/13/21 1615 02/13/21 1630  BP: 135/84 131/80  Pulse: 80 72  Resp: 16 14  Temp:    SpO2: 99% 99%    Last Pain:  Vitals:   02/13/21 1615  TempSrc:   PainSc: [place]

## 2021-02-13 NOTE — Discharge Instructions (Addendum)
Next dose of Tylenol can be given after 5:00PM.     Post Anesthesia Home Care Instructions  Activity: Get plenty of rest for the remainder of the day. A responsible individual must stay with you for 24 hours following the procedure.  For the next 24 hours, DO NOT: -Drive a car -Paediatric nurse -Drink alcoholic beverages -Take any medication unless instructed by your physician -Make any legal decisions or sign important papers.  Meals: Start with liquid foods such as gelatin or soup. Progress to regular foods as tolerated. Avoid greasy, spicy, heavy foods. If nausea and/or vomiting occur, drink only clear liquids until the nausea and/or vomiting subsides. Call your physician if vomiting continues.  Special Instructions/Symptoms: Your throat may feel dry or sore from the anesthesia or the breathing tube placed in your throat during surgery. If this causes discomfort, gargle with warm salt water. The discomfort should disappear within 24 hours.  If you had a scopolamine patch placed behind your ear for the management of post- operative nausea and/or vomiting:  1. The medication in the patch is effective for 72 hours, after which it should be removed.  Wrap patch in a tissue and discard in the trash. Wash hands thoroughly with soap and water. 2. You may remove the patch earlier than 72 hours if you experience unpleasant side effects which may include dry mouth, dizziness or visual disturbances. 3. Avoid touching the patch. Wash your hands with soap and water after contact with the patch.         [place] Office Phone Number [phone]  BREAST BIOPSY/ PARTIAL MASTECTOMY: POST OP INSTRUCTIONS  Always review your discharge instruction sheet given to you by the facility where your surgery was performed.  IF YOU HAVE DISABILITY OR FAMILY LEAVE FORMS, YOU MUST BRING THEM TO THE OFFICE FOR PROCESSING.  DO NOT GIVE THEM TO YOUR DOCTOR.  1. A prescription for pain  medication may be given to you upon discharge.  Take your pain medication as prescribed, if needed.  If narcotic pain medicine is not needed, then you may take acetaminophen (Tylenol) or ibuprofen (Advil) as needed. 2. Take your usually prescribed medications unless otherwise directed 3. If you need a refill on your pain medication, please contact your pharmacy.  They will contact our office to request authorization.  Prescriptions will not be filled after 5pm or on week-ends. 4. You should eat very light the first 24 hours after surgery, such as soup, crackers, pudding, etc.  Resume your normal diet the day after surgery. 5. Most patients will experience some swelling and bruising in the breast.  Ice packs and a good support bra will help.  Swelling and bruising can take several days to resolve.  6. It is common to experience some constipation if taking pain medication after surgery.  Increasing fluid intake and taking a stool softener will usually help or prevent this problem from occurring.  A mild laxative (Milk of Magnesia or Miralax) should be taken according to package directions if there are no bowel movements after 48 hours. 7. Unless discharge instructions indicate otherwise, you may remove your bandages 48 hours after surgery, and you may shower at that time.  You may have steri-strips (small skin tapes) in place directly over the incision.  These strips should be left on the skin for 7-10 days.   Any sutures or staples will be removed at the office during your follow-up visit. 8. ACTIVITIES:  You may resume regular daily activities (gradually increasing)  beginning the next day.  Wearing a good support bra or sports bra (or the breast binder) minimizes pain and swelling.  You may have sexual intercourse when it is comfortable. a. You may drive when you no longer are taking prescription pain medication, you can comfortably wear a seatbelt, and you can safely maneuver your car and apply  brakes. b. RETURN TO WORK:  __________1 week_______________ 9. You should see your doctor in the office for a follow-up appointment approximately two weeks after your surgery.  Your doctor's nurse will typically make your follow-up appointment when she calls you with your pathology report.  Expect your pathology report 2-3 business days after your surgery.  You may call to check if you do not hear from [place] after three days.   WHEN TO CALL YOUR DOCTOR: 1. Fever over 101.0 2. Nausea and/or vomiting. 3. Extreme swelling or bruising. 4. Continued bleeding from incision. 5. Increased pain, redness, or drainage from the incision.  The clinic staff is available to answer your questions during regular business hours.  Please don't hesitate to call and ask to speak to one of the nurses for clinical concerns.  If you have a medical emergency, go to the nearest emergency room or call 911.  A surgeon from [facility] is always on call at the hospital.  For further questions, please visit [email or web address]

## 2021-02-13 NOTE — Op Note (Signed)
Left Breast Radioactive seed localized lumpectomy, sentinel lymph node biopsy, seed targeted lymph node biopsy, and right subclavian port placement  Indications: This patient presents with history of left breast cancer, c1cN1 grade 3, upper inner quadrant, +/+/+  Pre-operative Diagnosis: left breast cancer  Post-operative Diagnosis: Same  Surgeon: Stark Klein   Anesthesia: General endotracheal anesthesia  ASA Class: 2  Procedure Details  The patient was seen in the Holding Room. The risks, benefits, complications, treatment options, and expected outcomes were discussed with the patient. The possibilities of bleeding, infection, the need for additional procedures, failure to diagnose a condition, and creating a complication requiring transfusion or operation were discussed with the patient. The patient concurred with the proposed plan, giving informed consent.  The site of surgery properly noted/marked. The patient was taken to Operating Room # 8, identified, and the procedure verified as left Breast Seed localized Lumpectomy, sentinel lymph node biopsy, seed targeted lymph node biopsy, and port placement.  . A Time Out was held and the above information confirmed.  The left arm and bilateral breast/chest were prepped and draped in standard fashion.   Local anesthetic was administered over this   area at the angle of the clavicle.  The vein was accessed with 1 pass(es) of the needle, but the wire would not thread.  Two additional sticks were required with good blood flow to get the wire to thread.  At that point, there was good venous return and the wire passed easily with no ectopy.   Fluoroscopy was used to confirm that the wire was in the vena cava.      The patient was placed back level and the area for the pocket was anethetized   with local anesthetic.  A 3-cm transverse incision was made with a #15   blade.  Cautery was used to divide the subcutaneous tissues down to the   pectoralis  muscle.  An [employer] retractor was used to elevate the skin   while a pocket was created on top of the pectoralis fascia.  The port   was placed into the pocket to confirm that it was of adequate size.  The   catheter was preattached to the port.  The port was then secured to the   pectoralis fascia with four 2-0 Prolene sutures.  These were clamped and   not tied down yet.    The catheter was tunneled through to the wire exit   site.  The catheter was placed along the wire to determine what length it should be to be in the SVC.  The catheter was cut at 17.5 cm.  The tunneler sheath and dilator were passed over the wire and the dilator and wire were removed.  The catheter was advanced through the tunneler sheath and the tunneler sheath was pulled away.  Care was taken to keep the catheter in the tunneler sheath as this occurred. This was advanced and the tunneler sheath was removed.  There was good venous   return and easy flush of the catheter.  The Prolene sutures were tied   down to the pectoral fascia.  The skin was reapproximated using 3-0   Vicryl interrupted deep dermal sutures.    Fluoroscopy was used to re-confirm good position of the catheter.  The skin   was then closed using 4-0 Monocryl in a subcuticular fashion.  The port was flushed with concentrated heparin flush as well.  The wounds were then cleaned, dried, and dressed with Dermabond.   The  lumpectomy was performed by creating a superior circumareolar incision near the previously placed radioactive seed.  Dissection was carried down to around the point of maximum signal intensity. The cautery was used to perform the dissection.  Hemostasis was achieved with cautery. The edges of the cavity were marked with large clips, with one each medial, lateral, inferior and superior.  No posterior clips were placed because of the implant.   The specimen was inked with the margin marker paint kit.    Specimen radiography confirmed inclusion of  the mammographic lesion, the clip, and the seed.  The background signal in the breast was zero.  The wound was irrigated and closed with 3-0 vicryl in layers and 4-0 monocryl subcuticular suture.    Using a hand-held gamma probe, Left axillary sentinel nodes were identified transcutaneously.  An oblique incision was created below the axillary hairline.  Dissection was carried through the clavipectoral fascia. The deep level 2 node with the seed was identified first.  This was dissected with cautery and clips.  Three additional level 2(deep) axillary sentinel nodes were removed.  Counts per second were 1600, 1200, 120, and 290.    The background count was 17 cps.  The wound was irrigated.  Hemostasis was achieved with cautery.  The axillary incision was closed with a 3-0 vicryl deep dermal interrupted sutures and a 4-0 monocryl subcuticular closure.    Sterile dressings were applied. At the end of the operation, all sponge, instrument, and needle counts were correct.  Findings: grossly clear surgical margins and no adenopathy. Posterior margin is pectoralis, anterior margin is skin.  port at 17.5 cm.  Breast implant capsule was NOT breached intraoperatively.    Estimated Blood Loss:  min         Specimens: left breast tissue with seed, anterior left breast tissue, additional inferior margin, deep left axillary lymph node with seed and three additional axillary sentinel lymph nodes.    Of note. Specimen #2 is directly anterior to #1.  Specimen #3 is the inferior margin of both specimens.           Complications:  None; patient tolerated the procedure well.         Disposition: PACU - hemodynamically stable.         Condition: stable

## 2021-02-13 NOTE — Transfer of Care (Signed)
Immediate Anesthesia Transfer of Care Note  Patient: [name]  Procedure(s) Performed: LEFT BREAST LUMPECTOMY WITH RADIOACTIVE SEED AND SENTINEL LYMPH NODE BIOPSY WITH SEED TARGETED NODE (Left Breast) INSERTION PORT-A-CATH (Right Breast)  Patient Location: PACU  Anesthesia Type:GA combined with regional for post-op pain  Level of Consciousness: drowsy and patient cooperative  Airway & Oxygen Therapy: Patient Spontanous Breathing and Patient connected to face mask oxygen  Post-op Assessment: Report given to RN and Post -op Vital signs reviewed and stable  Post vital signs: Reviewed and stable  Last Vitals:  Vitals Value Taken Time  BP    Temp    Pulse 68 02/13/21 1545  Resp 12 02/13/21 1545  SpO2 99 % 02/13/21 1545  Vitals shown include unvalidated device data.  Last Pain:  Vitals:   02/13/21 1053  TempSrc: Oral  PainSc: 0-No pain         Complications: No complications documented.

## 2021-02-14 ENCOUNTER — Encounter (HOSPITAL_BASED_OUTPATIENT_CLINIC_OR_DEPARTMENT_OTHER): Payer: Self-pay | Admitting: General Surgery

## 2021-02-14 NOTE — Addendum Note (Signed)
Addendum  created 02/14/21 1019 by Verita Lamb, CRNA   Charge Capture section accepted

## 2021-02-15 ENCOUNTER — Other Ambulatory Visit: Payer: Self-pay | Admitting: Hematology

## 2021-02-15 DIAGNOSIS — C50212 Malignant neoplasm of upper-inner quadrant of left female breast: Secondary | ICD-10-CM

## 2021-02-15 DIAGNOSIS — Z170 Estrogen receptor positive status [ER+]: Secondary | ICD-10-CM

## 2021-02-19 LAB — SURGICAL PATHOLOGY

## 2021-02-20 ENCOUNTER — Telehealth: Payer: Self-pay

## 2021-02-20 ENCOUNTER — Encounter: Payer: Self-pay | Admitting: *Deleted

## 2021-02-20 DIAGNOSIS — Z95828 Presence of other vascular implants and grafts: Secondary | ICD-10-CM

## 2021-02-20 MED ORDER — LIDOCAINE-PRILOCAINE 2.5-2.5 % EX CREA: 1.0000 "application " | TOPICAL_CREAM | CUTANEOUS | 2 refills | Status: DC | PRN

## 2021-02-20 NOTE — Telephone Encounter (Signed)
I spoke with Aimee Mcclain and reviewed PET scan appt and instructions.  She verbalized understanding.

## 2021-02-21 ENCOUNTER — Telehealth: Payer: Self-pay | Admitting: Hematology

## 2021-02-21 NOTE — Telephone Encounter (Signed)
R/s appts per 3/10 sch msg. Pt aware.

## 2021-02-27 ENCOUNTER — Inpatient Hospital Stay: Payer: BC Managed Care – PPO | Admitting: Hematology

## 2021-03-03 NOTE — Progress Notes (Incomplete)
Aimee Mcclain   [phone] [phone]   Clinic Follow up Note   Patient Care Team: [name], MD as PCP - General (Family Medicine) Rockwell Germany, RN as Oncology Nurse Navigator Mauro Kaufmann, RN as Oncology Nurse Navigator Stark Klein, MD as Consulting Physician (General Surgery) Truitt Merle, MD as Consulting Physician (Hematology) Eppie Gibson, MD as Attending Physician (Radiation Oncology)  Date of Service:  03/03/2021  CHIEF COMPLAINT: F/u of left breast cancer   SUMMARY OF ONCOLOGIC HISTORY: Oncology History Overview Note  Cancer Staging Malignant neoplasm of upper-inner quadrant of left breast in female, estrogen receptor positive ([place]) Staging form: Breast, AJCC 8th Edition - Clinical stage from 01/14/2020: Stage IB (cT1c, cN1, cM0, G3, ER+, PR+, HER2+) - Signed by Truitt Merle, MD on 01/21/2021 Stage prefix: Initial diagnosis    Malignant neoplasm of upper-inner quadrant of left breast in female, estrogen receptor positive ([place])  01/14/2020 Cancer Staging   Staging form: Breast, AJCC 8th Edition - Clinical stage from 01/14/2020: Stage IB (cT1c, cN1, cM0, G3, ER+, PR+, HER2+) - Signed by Truitt Merle, MD on 01/21/2021 Stage prefix: Initial diagnosis   01/02/2021 Mammogram   Left breast with 1.9x1x1.8 cm complex cyst at 12:00 position, 4cmfn, versus saloid mass with calcifications, irregulr, increased in size. Biopsy recommended.    01/13/2021 Initial Biopsy   Diagnosis 1. Breast, left, needle core biopsy, 11 o'clock, 4cmfn - INVASIVE DUCTAL CARCINOMA - SEE COMMENT 2. Lymph node, needle/core biopsy, left axilla - FIBROVASCULAR AND ADIPOSE TISSUE - NO LYMPHOID TISSUE OR CARCINOMA IDENTIFIED Microscopic Comment 1. Based on the biopsy, the carcinoma appears Nottingham grade 3 of 3 and measures 1.2 cm in greatest linear extent. Prognostic markers (ER/PR/ki-67/HER2) are pending and will be reported in an addendum. Dr. Jeannie Done reviewed  the case and agrees with the above diagnosis. These results were called to [facility] on January 14, 2021   01/13/2021 Receptors her2   1. PROGNOSTIC INDICATORS Results: IMMUNOHISTOCHEMICAL AND MORPHOMETRIC ANALYSIS PERFORMED MANUALLY The tumor cells are POSITIVE for Her2 (3+). Estrogen Receptor: 95%, POSITIVE, STRONG STAINING INTENSITY Progesterone Receptor: 85%, POSITIVE, STRONG STAINING INTENSITY Proliferation Marker Ki67: 30%   01/16/2021 Initial Diagnosis   Malignant neoplasm of upper-inner quadrant of left breast in female, estrogen receptor positive ([place])   01/23/2021 Imaging   MRI Breast  IMPRESSION: 1. Known malignancy measuring 2.9 centimeters in the UPPER INNER QUADRANT of the LEFT breast. There is no evidence for involvement of the pectoralis muscle or implant. 2. No evidence for lymphadenopathy. 3. RIGHT breast is negative. 4. Bilateral retropectoral saline implants.   02/09/2021 Imaging   MRI Brain IMPRESSION: No evidence of intracranial metastatic disease.   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     02/13/2021 Surgery   LEFT BREAST LUMPECTOMY WITH RADIOACTIVE SEED AND SENTINEL LYMPH NODE BIOPSY WITH SEED TARGETED NODE and PAC Placed by Dr Barry Dienes   02/13/2021 Pathology Results   FINAL MICROSCOPIC DIAGNOSIS:   A. BREAST, LEFT, LUMPECTOMY:  - Invasive ductal carcinoma, 2.6 cm, Nottingham grade 3 of 3.  - Ductal carcinoma in situ, high grade with central necrosis.  - Margins of resection:       - Invasive carcinoma broadly involves the anterior and inferior  margins.  Invasive carcinoma is focally < 1 mm from each the superior  and medial margins.       - DCIS is focally < 1 mm from the inferior margin.  DCIS is focally  1 mm from each the  anterior and superior margins.  DCIS is focally 1-2  mm from the medial margin.  - Biopsy clip.  - See oncology table.   B. BREAST, LEFT, ANTERIOR TISSUE, EXCISION:  - Residual invasive and in-situ ductal  carcinoma.  - Residual invasive ductal carcinoma broadly involves the posterior  margin.  - Residual DCIS is focally 1 mm from the posterior margin.   C. BREAST, LEFT, INFERIOR MARGIN, EXCISION:  - No carcinoma identified.   D. SENTINEL LYMPH NODE, LEFT AXILLARY #1, BIOPSY:  - No carcinoma identified in one lymph node (0/1).   E. SENTINEL LYMPH NODE, LEFT AXILLARY #2, BIOPSY:  - Biopsy site.  - No distinct nodal tissue identified.  - No carcinoma identified.   F. SENTINEL LYMPH NODE, LEFT AXILLARY #3, BIOPSY:  - No carcinoma identified in one lymph node (0/1).   G. SENTINEL LYMPH NODE, LEFT AXILLARY #4, BIOPSY:  - No carcinoma identified in one lymph node (0/1).       CURRENT THERAPY:  ***  INTERVAL HISTORY: *** Aimee Mcclain is here for a follow up of left breast cancer. She presents to the clinic alone.    REVIEW OF SYSTEMS:  *** Constitutional: Denies fevers, chills or abnormal weight loss Eyes: Denies blurriness of vision Ears, nose, mouth, throat, and face: Denies mucositis or sore throat Respiratory: Denies cough, dyspnea or wheezes Cardiovascular: Denies palpitation, chest discomfort or lower extremity swelling Gastrointestinal:  Denies nausea, heartburn or change in bowel habits Skin: Denies abnormal skin rashes Lymphatics: Denies new lymphadenopathy or easy bruising Neurological:Denies numbness, tingling or new weaknesses Behavioral/Psych: Mood is stable, no new changes  All other systems were reviewed with the patient and are negative.  MEDICAL HISTORY:  Past Medical History:  Diagnosis Date  . Breast cancer ([place])   . Cataract   . Colon polyps   . Endometriosis   . Family history of colon cancer   . Family history of colonic polyps   . GERD (gastroesophageal reflux disease)   . H/O bilateral breast implants   . History of colon polyps   . HPV in female   . HSV (herpes simplex virus) infection   . Migraine   . S/P TAH (total abdominal  hysterectomy) 1991    SURGICAL HISTORY: Past Surgical History:  Procedure Laterality Date  . ABDOMINAL HYSTERECTOMY    . BREAST BIOPSY     Benign.  Marland Kitchen BREAST ENHANCEMENT SURGERY    . BREAST LUMPECTOMY Bilateral 1993 and 1995  . BREAST LUMPECTOMY WITH RADIOACTIVE SEED AND SENTINEL LYMPH NODE BIOPSY Left 02/13/2021   Procedure: LEFT BREAST LUMPECTOMY WITH RADIOACTIVE SEED AND SENTINEL LYMPH NODE BIOPSY WITH SEED TARGETED NODE;  Surgeon: Stark Klein, MD;  Location: [facility];  Service: General;  Laterality: Left;  . CESAREAN SECTION    . PARTIAL HYSTERECTOMY    . PORTACATH PLACEMENT Right 02/13/2021   Procedure: INSERTION PORT-A-CATH;  Surgeon: Stark Klein, MD;  Location: [facility];  Service: General;  Laterality: Right;  . TONSILLECTOMY    . WISDOM TOOTH EXTRACTION      I have reviewed the social history and family history with the patient and they are unchanged from previous note.  ALLERGIES:  is allergic to ibuprofen, omnicef [cefdinir], proton pump inhibitors, and vioxx [rofecoxib].  MEDICATIONS:  Current Outpatient Medications  Medication Sig Dispense Refill  . acetaminophen (TYLENOL) 500 MG tablet Take 1,000 mg by mouth every 6 (six) hours as needed.    . Calcium 600-200  MG-UNIT tablet Take 1 tablet by mouth daily.    . calcium carbonate (TUMS - DOSED IN MG ELEMENTAL CALCIUM) 500 MG chewable tablet Chew 1 tablet by mouth as needed for indigestion or heartburn. Takes 5-6 per day    . lidocaine-prilocaine (EMLA) cream Apply 1 application topically as needed. 30 g 2  . Multiple Vitamin (MULTIVITAMIN) tablet Take 1 tablet by mouth daily.    Marland Kitchen oxyCODONE (OXY IR/ROXICODONE) 5 MG immediate release tablet Take 1 tablet (5 mg total) by mouth every 6 (six) hours as needed for severe pain. 10 tablet 0  . valACYclovir (VALTREX) 500 MG tablet Take 1 tablet (500 mg total) by mouth 2 (two) times daily as needed.    . vitamin B-12 (CYANOCOBALAMIN) 1000 MCG tablet  Take 1 tablet (1,000 mcg total) by mouth daily.     No current facility-administered medications for this visit.    PHYSICAL EXAMINATION: ECOG PERFORMANCE STATUS: {CHL ONC ECOG PS:[phone]}  There were no vitals filed for this visit. There were no vitals filed for this visit. *** GENERAL:alert, no distress and comfortable SKIN: skin color, texture, turgor are normal, no rashes or significant lesions EYES: normal, Conjunctiva are pink and non-injected, sclera clear {OROPHARYNX:no exudate, no erythema and lips, buccal mucosa, and tongue normal}  NECK: supple, thyroid normal size, non-tender, without nodularity LYMPH:  no palpable lymphadenopathy in the cervical, axillary {or inguinal} LUNGS: clear to auscultation and percussion with normal breathing effort HEART: regular rate & rhythm and no murmurs and no lower extremity edema ABDOMEN:abdomen soft, non-tender and normal bowel sounds Musculoskeletal:no cyanosis of digits and no clubbing  NEURO: alert & oriented x 3 with fluent speech, no focal motor/sensory deficits  LABORATORY DATA:  I have reviewed the data as listed CBC Latest Ref Rng & Units 01/22/2021 02/12/2020 10/24/2019  WBC 4.0 - 10.5 K/uL 4.4 5.9 5.4  Hemoglobin 12.0 - 15.0 g/dL 14.1 13.9 14.7  Hematocrit 36.0 - 46.0 % 42.4 41.3 44.1  Platelets 150 - 400 K/uL 230 174.0 205.0     CMP Latest Ref Rng & Units 01/22/2021 08/21/2020 02/12/2020  Glucose 70 - 99 mg/dL 89 - 93  BUN 8 - 23 mg/dL 17 - 16  Creatinine 0.44 - 1.00 mg/dL 0.83 - 0.88  Sodium 135 - 145 mmol/L 142 - 142  Potassium 3.5 - 5.1 mmol/L 4.0 - 4.1  Chloride 98 - 111 mmol/L 108 - 106  CO2 22 - 32 mmol/L 27 - 27  Calcium 8.9 - 10.3 mg/dL 9.5 - 9.6  Total Protein 6.5 - 8.1 g/dL 7.4 6.8 6.8  Total Bilirubin 0.3 - 1.2 mg/dL 0.4 - 0.4  Alkaline Phos 38 - 126 U/L 101 - 59  AST 15 - 41 U/L 26 - 22  ALT 0 - 44 U/L 28 - 18      RADIOGRAPHIC STUDIES: I have personally reviewed the radiological images as listed and  agreed with the findings in the report. No results found.   ASSESSMENT & PLAN:  Aimee Loose [place] is a 65 y.o. female with    1. Malignant neoplasm of upper-inner quadrant of left breast, Stage IB, c(T1cN1M0), ER+/PR+/HER2+, Grade III  -She was diagnosed in 12/2020 with 1.9cm mass in the left breast found to be invasive ductal carcinoma. Her abnormal LN on [place] but biopsy was negative which is likely missed biopsy.   -Her MRI breast showed no further malignancy.  -She underwent left lumpectomy and SLNB on 02/13/21 by Dr Barry Dienes. *** -I discussed this is  the more agressive disease given HER2 positive disease and high grade, she has higher risk of recurrence. To reduce her risk of recurrence after surgery, I recommend chemotherapy along with anti-HER2 treatment with weekly Taxol and Herceptin for 12 weeks. She will continue anti-HER2 Herceptin q3weeks to complete 1 year of treatment.  -Adjuvant radiation is recommended after chemo to reduce her risk of local recurrence and adjuvant endocrine therapy with aromatase inhibitor for a total of 5-10 years to reduce the risk of cancer recurrence.    2. Hot Flashes, Hormonal replacement  -After early menopause in her early 21s, she started hormonal replacement years later.  -She has been on Estrogen patch (5 years), Estrodial (90 days), vagifim tabs for 5 years. She overall stopped after breast cancer diagnosis in 12/2020.  -I advised her to continue cessation of HR and to not restart as her breast cancer is sensitive to hormones. She voiced good understanding.  -Her hot flashes returned after stopping HR. I discussed SSRI such as effexor. She declined for now.   3. Headaches -She notes she has had chronic migraines that did stop previously but returned in frontal and occipital lobes every 1-2 days in the past 2-3 months. This has been manageable with Tylenol. She denies vision change, weaknesses or numbness.  -She has known neuropathy in her feet,  etiology unknown. She continues to f/u with Dr Krista Blue.  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   No orders of the defined types were placed in this encounter.  All questions were answered. The patient knows to call the clinic with any problems, questions or concerns. No barriers to learning was detected. The total time spent in the appointment was {CHL ONC TIME VISIT - TPELG:[id number]}.     Joslyn Devon 03/03/2021   Oneal Deputy, am acting as scribe for Truitt Merle, MD.   {Add scribe attestation statement}

## 2021-03-04 ENCOUNTER — Other Ambulatory Visit: Payer: Self-pay

## 2021-03-04 ENCOUNTER — Other Ambulatory Visit: Payer: Self-pay | Admitting: Hematology

## 2021-03-04 ENCOUNTER — Encounter (HOSPITAL_COMMUNITY): Payer: Self-pay

## 2021-03-04 ENCOUNTER — Ambulatory Visit (HOSPITAL_COMMUNITY): Payer: BC Managed Care – PPO

## 2021-03-04 DIAGNOSIS — C50212 Malignant neoplasm of upper-inner quadrant of left female breast: Secondary | ICD-10-CM

## 2021-03-05 ENCOUNTER — Other Ambulatory Visit: Payer: Self-pay | Admitting: *Deleted

## 2021-03-05 DIAGNOSIS — C50212 Malignant neoplasm of upper-inner quadrant of left female breast: Secondary | ICD-10-CM

## 2021-03-05 DIAGNOSIS — Z170 Estrogen receptor positive status [ER+]: Secondary | ICD-10-CM

## 2021-03-06 ENCOUNTER — Ambulatory Visit (HOSPITAL_COMMUNITY)
Admission: RE | Admit: 2021-03-06 | Discharge: 2021-03-06 | Disposition: A | Discharge status: Home / Self Care | Payer: BC Managed Care – PPO | Source: Ambulatory Visit | Attending: Hematology | Admitting: Hematology

## 2021-03-06 ENCOUNTER — Inpatient Hospital Stay: Payer: BC Managed Care – PPO | Admitting: Hematology

## 2021-03-06 ENCOUNTER — Inpatient Hospital Stay: Payer: BC Managed Care – PPO

## 2021-03-06 ENCOUNTER — Other Ambulatory Visit: Payer: Self-pay

## 2021-03-06 ENCOUNTER — Ambulatory Visit: Payer: BC Managed Care – PPO | Admitting: Physical Therapy

## 2021-03-06 ENCOUNTER — Telehealth: Payer: Self-pay | Admitting: *Deleted

## 2021-03-06 DIAGNOSIS — Z170 Estrogen receptor positive status [ER+]: Secondary | ICD-10-CM | POA: Diagnosis not present

## 2021-03-06 DIAGNOSIS — C50212 Malignant neoplasm of upper-inner quadrant of left female breast: Secondary | ICD-10-CM | POA: Diagnosis not present

## 2021-03-06 LAB — GLUCOSE, CAPILLARY: Glucose-Capillary: 94 mg/dL (ref 70–99)

## 2021-03-06 MED ORDER — FLUDEOXYGLUCOSE F - 18 (FDG) INJECTION
9.9600 | Freq: Once | INTRAVENOUS | Status: AC
  Administered 2021-03-06: 9.96 via INTRAVENOUS

## 2021-03-06 NOTE — Progress Notes (Signed)
[place] OFFICE PROGRESS NOTE  Aimee Ghent, MD [place] [id number]  DIAGNOSIS: Newly diagnosed Malignant neoplasm of upper-inner quadrant of left breast   Oncology History Overview Note  Cancer Staging Malignant neoplasm of upper-inner quadrant of left breast in female, estrogen receptor positive ([place]) Staging form: Breast, AJCC 8th Edition - Clinical stage from 01/14/2020: Stage IB (cT1c, cN1, cM0, G3, ER+, PR+, HER2+) - Signed by Truitt Merle, MD on 01/21/2021 Stage prefix: Initial diagnosis    Malignant neoplasm of upper-inner quadrant of left breast in female, estrogen receptor positive ([place])  01/14/2020 Cancer Staging   Staging form: Breast, AJCC 8th Edition - Clinical stage from 01/14/2020: Stage IB (cT1c, cN1, cM0, G3, ER+, PR+, HER2+) - Signed by Truitt Merle, MD on 01/21/2021 Stage prefix: Initial diagnosis   01/02/2021 Mammogram   Left breast with 1.9x1x1.8 cm complex cyst at 12:00 position, 4cmfn, versus saloid mass with calcifications, irregulr, increased in size. Biopsy recommended.    01/13/2021 Initial Biopsy   Diagnosis 1. Breast, left, needle core biopsy, 11 o'clock, 4cmfn - INVASIVE DUCTAL CARCINOMA - SEE COMMENT 2. Lymph node, needle/core biopsy, left axilla - FIBROVASCULAR AND ADIPOSE TISSUE - NO LYMPHOID TISSUE OR CARCINOMA IDENTIFIED Microscopic Comment 1. Based on the biopsy, the carcinoma appears Nottingham grade 3 of 3 and measures 1.2 cm in greatest linear extent. Prognostic markers (ER/PR/ki-67/HER2) are pending and will be reported in an addendum. Dr. Jeannie Done reviewed the case and agrees with the above diagnosis. These results were called to [facility] on January 14, 2021   01/13/2021 Receptors her2   1. PROGNOSTIC INDICATORS Results: IMMUNOHISTOCHEMICAL AND MORPHOMETRIC ANALYSIS PERFORMED MANUALLY The tumor cells are POSITIVE for Her2 (3+). Estrogen Receptor: 95%, POSITIVE, STRONG STAINING  INTENSITY Progesterone Receptor: 85%, POSITIVE, STRONG STAINING INTENSITY Proliferation Marker Ki67: 30%   01/16/2021 Initial Diagnosis   Malignant neoplasm of upper-inner quadrant of left breast in female, estrogen receptor positive ([place])   01/23/2021 Imaging   MRI Breast  IMPRESSION: 1. Known malignancy measuring 2.9 centimeters in the UPPER INNER QUADRANT of the LEFT breast. There is no evidence for involvement of the pectoralis muscle or implant. 2. No evidence for lymphadenopathy. 3. RIGHT breast is negative. 4. Bilateral retropectoral saline implants.   02/09/2021 Imaging   MRI Brain IMPRESSION: No evidence of intracranial metastatic disease.   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     02/13/2021 Surgery   LEFT BREAST LUMPECTOMY WITH RADIOACTIVE SEED AND SENTINEL LYMPH NODE BIOPSY WITH SEED TARGETED NODE and PAC Placed by Dr Barry Dienes   02/13/2021 Pathology Results   FINAL MICROSCOPIC DIAGNOSIS:   A. BREAST, LEFT, LUMPECTOMY:  - Invasive ductal carcinoma, 2.6 cm, Nottingham grade 3 of 3.  - Ductal carcinoma in situ, high grade with central necrosis.  - Margins of resection:       - Invasive carcinoma broadly involves the anterior and inferior  margins.  Invasive carcinoma is focally < 1 mm from each the superior  and medial margins.       - DCIS is focally < 1 mm from the inferior margin.  DCIS is focally  1 mm from each the anterior and superior margins.  DCIS is focally 1-2  mm from the medial margin.  - Biopsy clip.  - See oncology table.   B. BREAST, LEFT, ANTERIOR TISSUE, EXCISION:  - Residual invasive and in-situ ductal carcinoma.  - Residual invasive ductal carcinoma broadly involves the posterior  margin.  - Residual DCIS  is focally 1 mm from the posterior margin.   C. BREAST, LEFT, INFERIOR MARGIN, EXCISION:  - No carcinoma identified.   D. SENTINEL LYMPH NODE, LEFT AXILLARY #1, BIOPSY:  - No carcinoma identified in one lymph node (0/1).   E.  SENTINEL LYMPH NODE, LEFT AXILLARY #2, BIOPSY:  - Biopsy site.  - No distinct nodal tissue identified.  - No carcinoma identified.   F. SENTINEL LYMPH NODE, LEFT AXILLARY #3, BIOPSY:  - No carcinoma identified in one lymph node (0/1).   G. SENTINEL LYMPH NODE, LEFT AXILLARY #4, BIOPSY:  - No carcinoma identified in one lymph node (0/1).    02/13/2021 Cancer Staging   Staging form: Breast, AJCC 8th Edition - Pathologic stage from 02/13/2021: Stage IA (pT2, pN0, cM0, G3, ER+, PR+, HER2+) - Signed by Heilingoetter, Cassandra L, PA-C on 03/07/2021 Stage prefix: Initial diagnosis Nuclear grade: G3 Histologic grading system: 3 grade system   03/12/2021 -  Chemotherapy    Patient is on Treatment Plan: BREAST PACLITAXEL + TRASTUZUMAB Q7D / TRASTUZUMAB Q21D        CURRENT THERAPY: Weekly taxol and herceptin x12, then maintenance herceptin every 3 weeks for 1 year. First dose expected the week of 03/17/21.  INTERVAL HISTORY: Aimee Mcclain 64 y.o. female returns to the clinic today for a follow up visit. The patient was first seen in the clinic by Dr. Feng on 01/22/21. The patient was found to have left breast cancer which was found on routine mammogram. She subsequently underwent a left breast radioactive seed localized lumpectomy, sentinel lymph node biopsy, seed targeted lymph node biopsy, and right subclavian port placement on 02/13/21 under the care of Dr. Byerly. She is scheduled to follow up with Dr. Byerly on 03/17/21. The final pathology was consistent with 2.6 cm grade 3 ER positive and Her-2 positive breast cancer. The pathologic stage was pT2, pN0. Her surgical site is well healed. She has some discomfort near the surgical site, lateral left chest, left axillary region, and under her left arm. She denies pain but notes it is "touchy".  In the interval since her last appointment, she is feeling fairly well but she is anxious to start treatment. She denies fevers, chills, night sweats, or weight  loss.  She denies nausea, vomiting, abdominal pain, or diarrhea/constipation. She denies chest pain, cough, or shortness of breath. The patient denies any prior cardiac history. She had an echocardiogram performed several years ago at [facility] for the chief complaint of palpitations which was reportedly normal. No records are available to me. She reported chronic migraines which had worsened a few months before her first appointment so Dr. Feng ordered an MRI of the brain to further evaluate and rule out metastatic disease. She states her headaches are better. Denies any visual changes, gait changes, or seizure activity. She generally localizes her headaches to the occipital region and describes the pain as shooting pain that lasts a few seconds. She changed her pillow and tried other lifestyle changes and notes her headaches now occur about once every few weeks. She will take 2 aleve when this occurs with improvement in her symptoms. The patient reports baseline neuropathy in her toes. The etiology is unclear. She has seen her PCP for this and has been taking B12 supplements. She has not noticed an appreciable difference in her neuropathy.  She had a PET scan and brain MRI performed to complete the staging workup. She is here for evaluation and to review her scan   results and for a more detailed discussion about her current condition and recommended treatment options.    MEDICAL HISTORY: Past Medical History:  Diagnosis Date  . Breast cancer (HCC)   . Cataract   . Colon polyps   . Endometriosis   . Family history of colon cancer   . Family history of colonic polyps   . GERD (gastroesophageal reflux disease)   . H/O bilateral breast implants   . History of colon polyps   . HPV in female   . HSV (herpes simplex virus) infection   . Migraine   . S/P TAH (total abdominal hysterectomy) 1991    ALLERGIES:  is allergic to ibuprofen, omnicef [cefdinir], proton pump inhibitors, and vioxx  [rofecoxib].  MEDICATIONS:  Current Outpatient Medications  Medication Sig Dispense Refill  . ondansetron (ZOFRAN) 8 MG tablet Take 1 tablet (8 mg total) by mouth every 8 (eight) hours as needed for nausea or vomiting. 30 tablet 2  . prochlorperazine (COMPAZINE) 10 MG tablet Take 1 tablet (10 mg total) by mouth every 6 (six) hours as needed. 30 tablet 2  . acetaminophen (TYLENOL) 500 MG tablet Take 1,000 mg by mouth every 6 (six) hours as needed.    . Calcium 600-200 MG-UNIT tablet Take 1 tablet by mouth daily.    . calcium carbonate (TUMS - DOSED IN MG ELEMENTAL CALCIUM) 500 MG chewable tablet Chew 1 tablet by mouth as needed for indigestion or heartburn. Takes 5-6 per day    . lidocaine-prilocaine (EMLA) cream Apply 1 application topically as needed. 30 g 2  . Multiple Vitamin (MULTIVITAMIN) tablet Take 1 tablet by mouth daily.    . oxyCODONE (OXY IR/ROXICODONE) 5 MG immediate release tablet Take 1 tablet (5 mg total) by mouth every 6 (six) hours as needed for severe pain. 10 tablet 0  . valACYclovir (VALTREX) 500 MG tablet Take 1 tablet (500 mg total) by mouth 2 (two) times daily as needed.    . vitamin B-12 (CYANOCOBALAMIN) 1000 MCG tablet Take 1 tablet (1,000 mcg total) by mouth daily.     No current facility-administered medications for this visit.    SURGICAL HISTORY:  Past Surgical History:  Procedure Laterality Date  . ABDOMINAL HYSTERECTOMY    . BREAST BIOPSY     Benign.  . BREAST ENHANCEMENT SURGERY    . BREAST LUMPECTOMY Bilateral 1993 and 1995  . BREAST LUMPECTOMY WITH RADIOACTIVE SEED AND SENTINEL LYMPH NODE BIOPSY Left 02/13/2021   Procedure: LEFT BREAST LUMPECTOMY WITH RADIOACTIVE SEED AND SENTINEL LYMPH NODE BIOPSY WITH SEED TARGETED NODE;  Surgeon: Byerly, Faera, MD;  Location: [facility];  Service: General;  Laterality: Left;  . CESAREAN SECTION    . PARTIAL HYSTERECTOMY    . PORTACATH PLACEMENT Right 02/13/2021   Procedure: INSERTION PORT-A-CATH;   Surgeon: Byerly, Faera, MD;  Location: [facility];  Service: General;  Laterality: Right;  . TONSILLECTOMY    . WISDOM TOOTH EXTRACTION      REVIEW OF SYSTEMS:   Review of Systems  Constitutional: Negative for appetite change, chills, fatigue, fever and unexpected weight change.  HENT: Negative for mouth sores, nosebleeds, sore throat and trouble swallowing.   Eyes: Negative for eye problems and icterus.  Respiratory: Negative for cough, hemoptysis, shortness of breath and wheezing.   Cardiovascular: Negative for chest pain and leg swelling.  Gastrointestinal: Negative for abdominal pain, constipation, diarrhea, nausea and vomiting.  Genitourinary: Negative for bladder incontinence, difficulty urinating, dysuria, frequency and hematuria.     Musculoskeletal: Positive for some discomfort over surgical side, left axilla, and left lateral chest. Negative for back pain, gait problem, neck pain and neck stiffness.  Skin: Negative for itching and rash.  Neurological: Positive for occasional headaches. Negative for dizziness, extremity weakness, gait problem, light-headedness and seizures.  Hematological: Negative for adenopathy. Does not bruise/bleed easily.  Psychiatric/Behavioral: Negative for confusion, depression and sleep disturbance. The patient is not nervous/anxious.     PHYSICAL EXAMINATION:  Blood pressure (!) 153/82, pulse (!) 106, temperature (!) 97.5 F (36.4 C), temperature source Tympanic, resp. rate 18, height 5' 7" (1.702 m), weight 200 lb 1.6 oz (90.8 kg), SpO2 98 %.  ECOG PERFORMANCE STATUS: 1 - Symptomatic but completely ambulatory  Physical Exam  Constitutional: Oriented to person, place, and time and well-developed, well-nourished, and in no distress.  HENT:  Head: Normocephalic and atraumatic.  Mouth/Throat: Oropharynx is clear and moist. No oropharyngeal exudate.  Eyes: Conjunctivae are normal. Right eye exhibits no discharge. Left eye exhibits no  discharge. No scleral icterus.  Neck: Normal range of motion. Neck supple.  Cardiovascular: Normal rate, regular rhythm, normal heart sounds and intact distal pulses.   Pulmonary/Chest: Effort normal and breath sounds normal. No respiratory distress. No wheezes. No rales.  Abdominal: Soft. Bowel sounds are normal. Exhibits no distension and no mass. There is no tenderness.  Musculoskeletal: Normal range of motion. Exhibits no edema.  Lymphadenopathy:    No cervical adenopathy.  Neurological: Alert and oriented to person, place, and time. Exhibits normal muscle tone. Gait normal. Coordination normal.  Skin: Skin is warm and dry. No rash noted. Not diaphoretic. No erythema. No pallor.  Psychiatric: Mood, memory and judgment normal.  Breast: Well healed scar over lumpectomy site and in axilla. No signs of seroma, skin changes, swelling, or erythema.  Vitals reviewed.  LABORATORY DATA: Lab Results  Component Value Date   WBC 4.4 01/22/2021   HGB 14.1 01/22/2021   HCT 42.4 01/22/2021   MCV 87.4 01/22/2021   PLT 230 01/22/2021      Chemistry      Component Value Date/Time   NA 142 01/22/2021 0813   K 4.0 01/22/2021 0813   CL 108 01/22/2021 0813   CO2 27 01/22/2021 0813   BUN 17 01/22/2021 0813   CREATININE 0.83 01/22/2021 0813      Component Value Date/Time   CALCIUM 9.5 01/22/2021 0813   ALKPHOS 101 01/22/2021 0813   AST 26 01/22/2021 0813   ALT 28 01/22/2021 0813   BILITOT 0.4 01/22/2021 0813       RADIOGRAPHIC STUDIES:  MR Brain W Wo Contrast  Result Date: 02/10/2021 CLINICAL DATA:  Headache, breast cancer EXAM: MRI HEAD WITHOUT AND WITH CONTRAST TECHNIQUE: Multiplanar, multiecho pulse sequences of the brain and surrounding structures were obtained without and with intravenous contrast. CONTRAST:  18mL MULTIHANCE GADOBENATE DIMEGLUMINE 529 MG/ML IV SOLN COMPARISON:  None. FINDINGS: Brain: There is no acute infarction or intracranial hemorrhage. There is no intracranial  mass, mass effect, or edema. There is no hydrocephalus or extra-axial fluid collection. Ventricles sulci are within normal limits in size and configuration. A few patchy foci of T2 hyperintensity the supratentorial white are nonspecific but may reflect minor microvascular ischemic changes. No abnormal enhancement. Vascular: Major vessel flow voids at the skull base are preserved. Skull and upper cervical spine: Focal abnormal marrow signal with enhancement along the outer table of the the left parietal calvarium (series 13, image 105). No extraosseous extension. Sinuses/Orbits: Paranasal sinuses are aerated. Orbits   are unremarkable. Other: Sella is unremarkable.  Mastoid air cells are clear. IMPRESSION: No evidence of intracranial metastatic disease. 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 Electronically Signed   By: Macy Mis M.D.   On: 02/10/2021 09:41   NM PET Image Initial (PI) Skull Base To Thigh  Result Date: 03/07/2021 CLINICAL DATA:  Initial treatment strategy for left breast carcinoma status post lumpectomy and axillary node dissection 02/13/2021. EXAM: NUCLEAR MEDICINE PET SKULL BASE TO THIGH TECHNIQUE: 10.0 mCi F-18 FDG was injected intravenously. Full-ring PET imaging was performed from the skull base to thigh after the radiotracer. CT data was obtained and used for attenuation correction and anatomic localization. Fasting blood glucose: 94 mg/dl COMPARISON:  None. FINDINGS: Mediastinal blood pool activity: SUV max 2.8 Liver activity: SUV max NA NECK: No hypermetabolic lymph nodes in the neck. Incidental CT findings: none CHEST: No enlarged or hypermetabolic axillary, mediastinal or hilar lymph nodes. Nonspecific mild hypermetabolism associated with patchy fat stranding in the low left axilla with adjacent surgical clips, favor postsurgical inflammatory change. Nonspecific mild patchy hypermetabolism in the inner left breast with adjacent surgical clips and 3.6 x 2.5 cm fluid collection  with small focus of internal gas, favor postsurgical change. No hypermetabolic pulmonary findings. Three scattered small solid right pulmonary nodules, largest 4 mm in the medial right upper lobe (series 8/image 35), below PET resolution. Incidental CT findings: Bilateral breast prostheses. Right subclavian Port-A-Cath terminates in the lower third of the SVC. ABDOMEN/PELVIS: No abnormal hypermetabolic activity within the liver, pancreas, adrenal glands, or spleen. No hypermetabolic lymph nodes in the abdomen or pelvis. Incidental CT findings: Atherosclerotic nonaneurysmal abdominal aorta. Hysterectomy. SKELETON: No focal hypermetabolic activity to suggest skeletal metastasis. Soft tissue hypermetabolism adjacent to the left greater trochanter with associated enthesophyte compatible with inflammatory uptake. No discrete mass. Incidental CT findings: none IMPRESSION: 1. No evidence of hypermetabolic metastatic disease. 2. Three scattered small solid right pulmonary nodules, largest 4 mm, below PET resolution. Suggest attention on follow-up chest CT in 3-6 months. 3. Patchy mild hypermetabolism in the left axilla associated with mild patchy fat stranding with adjacent surgical clips, favor postsurgical change. 4. Patchy mild hypermetabolism in the inner left breast adjacent to surgical clips and a small fluid collection with small amount of internal gas, favor postsurgical change. 5. Soft tissue hypermetabolism adjacent to the left greater trochanter with associated enthesophyte, compatible with inflammatory uptake. 6.  Aortic Atherosclerosis (ICD10-I70.0). Electronically Signed   By: Ilona Sorrel M.D.   On: 03/07/2021 10:46   NM Sentinel Node Inj-No Rpt (Breast)  Result Date: 02/13/2021 Sulfur colloid was injected by the nuclear medicine technologist for melanoma sentinel node.   DG CHEST PORT 1 VIEW  Result Date: 02/13/2021 CLINICAL DATA:  Status post Port-A-Cath placement. EXAM: PORTABLE CHEST 1 VIEW  COMPARISON:  February 12, 2020. FINDINGS: Stable cardiomegaly. Interval placement of right subclavian Port-A-Cath with distal tip in expected position of cavoatrial junction. No pneumothorax or pleural effusion is noted. Lungs are clear. Subcutaneous emphysema is seen overlying the left lateral chest wall. Bony thorax is unremarkable. IMPRESSION: Interval placement of right subclavian Port-A-Cath with distal tip in expected position of cavoatrial junction. No pneumothorax is noted. Subcutaneous emphysema is seen overlying the left lateral chest wall. Electronically Signed   By: Marijo Conception M.D.   On: 02/13/2021 16:31   DG Fluoro Guide CV Line-No Report  Result Date: 02/13/2021 Fluoroscopy was utilized by the requesting physician.  No radiographic interpretation.     ASSESSMENT/PLAN:  Aimee Simmer  R Mcclain is a 64 y.o. Caucasian female with H/o b/l implant placement, S/p B/l lumpectomy (benign)   1. Malignant neoplasm of upper-inner quadrant of left breast, Stage p(T2,N0,M0), ER+/PR+/HER2+, Grade III  -Initial imaging showed she has 1.9cm mass in the left breast found to be invasive ductal carcinoma. Her abnormal LN on US but biopsy was negative which is likely missed biopsy.   -MRI breast was recommended given her HER2 positive disease to further evaluate her tumor size, axillary nodes and rule out multifocal disease. This showed known malignancy measuring 2.9 centimeters in the UPPER INNER QUADRANT of the LEFT breast. There is no evidence for involvement of the pectoralis muscle or implant. No lymphadenopathy. -She underwent left lumpectomy under the care of Dr. Byerly on 02/13/21 which showed 2.6 cm invasive ductal carcinoma. The final pathologic stage was pT2, N0, M0. Margins negative.  -She had a brain MRI and PET scan to further stage her disease.  The patient was seen with Dr. Feng today. Dr. Feng had a lengthy discussion with the patient about her current condition and recommended treatment  options. She also reviewed her imaging studies in detail.   -Dr. Feng discussed this is the more agressive disease given HER2 positive disease, tumor size, and high grade, she has higher risk of recurrence.  -To reduce her risk of recurrence after surgery, Dr. Feng recommended chemotherapy along with anti-HER2 treatment. Dr. Feng recommends adjuvant chemo with weekly Taxol and Herceptin for 12 weeks. Dr. Feng would recommend she continue anti-HER2 Herceptin q3weeks to complete 1 year of treatment.              -Dr. Feng discussed the logistics and side effect of the above treatment in detail. Side effects including but not limited to fatigue, nausea, vomiting, diarrhea, hair thinning/loss, neuropathy, renal and liver dysfunction, neutropenic fever, cardiac dysfunction, need for blood transfusion, bleeding, were discussed with patient in great detail. Consent: She agrees to proceed. -Dr. Feng mentioned that if the patient is interested, we can provide a prescription for a wig.  -She has been seen by Dr. Squire. Adjuvant radiation is recommended after lumpectomy to reduce her risk of local recurrence.  This will be arranged after she completes chemotherapy.  -Given the strong ER and PR expression in her postmenopausal status, Dr. Feng discussed that after the patient completes her initial treatment, she would recommend adjuvant endocrine therapy in the future with aromatase inhibitor for a total of 5-10 years to reduce the risk of cancer recurrence.  -We also discussed the breast cancer surveillance after her surgery. She will continue annual screening mammogram, self exam, and a routine office visit with lab and exam with us. -We will arrange for the patient to start chemotherapy in about 2 weeks which will be about 4-5 weeks from her surgery.  -I will arrange for the patient to have a baseline echocardiogram. This will be repeated every 3 months or so up to 1 year after the patient completes maintenance  herceptin.  -We will see the patient back for a follow up visit with day 1 of chemotherapy.    2. Headaches/Migraines -She noted at her last appointment that she has had chronic migraines that did stop previously but returned in frontal and occipital lobes every 1-2 days in the past 2-3 months. This has been manageable with Tylenol. She denies vision change, weaknesses or numbness.  -Today (03/07/21) the patient mentions that the headaches have not been "too bad" recently. -Dr. Feng obtained MRI brain which   reported "no evidence of intracranial metastatic disease. 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  -Dr. Feng spoke to radiologist, Dr. Mansell. In the absence of any other bone mets on her PET scan and that the bone lesion has sclerotic margins, this lesion may be benign as it would be highly unsual to have a solitary skeletal metastasis to the skull. Therefore, they recommended follow up MRI of the brain in 3 months. I have placed the order today.   3. Peripheral neuropathy -Has some baseline numbness and tingling in toes, unclear etiology. Has been taking B12 supplements as directed by PCP -Dr. Feng discussed that taxol may cause peripheral neuropathy, and that it is typically dose related. Advised the patient to monitor her symptoms closely and to let us know if new or worsening symptoms of neuropathy.   -Dr. Feng discussed cryotherapy to reduce risk of chemotherapy induced peripheral neuropathy. -Dr. Feng discussed she can take B Complex vitamins or continue B12 -We will recheck B12 with next lab appointment   4. Hot Flashes, Hormonal replacement  -After early menopause in her early 40s, she started hormonal replacement years later.  -She has been on Estrogen patch (5 years), Estrodial (90 days), vagifim tabs for 5 years. She overall stopped after breast cancer diagnosis in 12/2020.  -Dr. Feng previously advised her to continue cessation of HR and to not restart as her breast cancer is  sensitive to hormones. She voiced good understanding.  -Her hot flashes returned after stopping HR. Dr. Feng previously discussed SSRI such as effexor. She declined for now.  PLAN:  -Repeat brain MRI in 3 months -Start weekly taxol and herceptin the week of 03/17/21 -Anti-emetics sent to pharmacy -Check B12 at next lab visit -F/u with first cycle of chemothearpy -Ordered baseline echocardiogram    Orders Placed This Encounter  Procedures  . MR Brain W Wo Contrast    Standing Status:   Future    Standing Expiration Date:   03/07/2022    Order Specific Question:   If indicated for the ordered procedure, I authorize the administration of contrast media per Radiology protocol    Answer:   Yes    Order Specific Question:   What is the patient's sedation requirement?    Answer:   No Sedation    Order Specific Question:   Does the patient have a pacemaker or implanted devices?    Answer:   No    Order Specific Question:   Use SRS Protocol?    Answer:   No    Order Specific Question:   Preferred imaging location?    Answer:   [facility] (table limit - 550 lbs)  . Vitamin B12    Standing Status:   Future    Standing Expiration Date:   03/07/2022  . ECHOCARDIOGRAM COMPLETE    Standing Status:   Future    Standing Expiration Date:   03/07/2022    Order Specific Question:   Where should this test be performed    Answer:   [facility]    Order Specific Question:   Perflutren DEFINITY (image enhancing agent) should be administered unless hypersensitivity or allergy exist    Answer:   Administer Perflutren    Order Specific Question:   Reason for exam-Echo    Answer:   Chemo  [id number]     Cassandra L Heilingoetter, PA-C 03/07/21   Addendum  I have seen the patient, examined her. I agree with the assessment and and plan and have   edited the notes.   Mrs. Mancia has recovered well from surgery.  I personally reviewed his PET scan images, and also discussed with radiologist about  abnormal finding on previous brain MRI.  No definitive evidence of distant metastasis, plan to repeat brain MRI in 3 months to follow-up for left parietal bone lesion.  I reviewed her surgical pathology findings with patient and her husband in details and recommended adjuvant chemotherapy with weekly Taxol and trastuzumab for 12 weeks, followed by trastuzumab every 3 weeks to complete 1 year of therapy.  Benefit and side effects discussed with patient, chemo consent obtained.  We also reviewed the visit chemo regiment, such as THP, [place], given her tumor <3cm and negative lymph nodes, I do not think she needs more intensive chemotherapy.  Plan to start in 2 weeks. She has a port in place. Will arrange chemo class and baseline echocardiogram, and repeat echo every 3 months when she is on trastuzumab. All questions were answered.   Truitt Merle  03/07/2021

## 2021-03-06 NOTE — Telephone Encounter (Signed)
Scheduled and confirmed appt with Dr. Burr Medico and Campbell Stall, NP on 3/25 at 1:30pm to discuss PET scan results. No further needs or questions voiced.

## 2021-03-07 ENCOUNTER — Encounter: Payer: Self-pay | Admitting: *Deleted

## 2021-03-07 ENCOUNTER — Other Ambulatory Visit: Payer: Self-pay

## 2021-03-07 ENCOUNTER — Other Ambulatory Visit: Payer: Self-pay | Admitting: Hematology

## 2021-03-07 ENCOUNTER — Inpatient Hospital Stay: Payer: BC Managed Care – PPO | Attending: Hematology | Admitting: Physician Assistant

## 2021-03-07 VITALS — BP 153/82 | HR 106 | Temp 97.5°F | Resp 18 | Ht 67.0 in | Wt 200.1 lb

## 2021-03-07 DIAGNOSIS — R112 Nausea with vomiting, unspecified: Secondary | ICD-10-CM | POA: Diagnosis not present

## 2021-03-07 DIAGNOSIS — Z170 Estrogen receptor positive status [ER+]: Secondary | ICD-10-CM

## 2021-03-07 DIAGNOSIS — C50912 Malignant neoplasm of unspecified site of left female breast: Secondary | ICD-10-CM | POA: Diagnosis not present

## 2021-03-07 DIAGNOSIS — C50212 Malignant neoplasm of upper-inner quadrant of left female breast: Secondary | ICD-10-CM | POA: Diagnosis not present

## 2021-03-07 MED ORDER — CYANOCOBALAMIN 1000 MCG/ML IJ SOLN: 1000.0000 ug | Freq: Once | INTRAMUSCULAR | Status: DC

## 2021-03-07 MED ORDER — PROCHLORPERAZINE MALEATE 10 MG PO TABS: 10.0000 mg | ORAL_TABLET | Freq: Four times a day (QID) | ORAL | 2 refills | Status: DC | PRN

## 2021-03-07 MED ORDER — ONDANSETRON HCL 8 MG PO TABS: 8.0000 mg | ORAL_TABLET | Freq: Three times a day (TID) | ORAL | 2 refills | Status: DC | PRN

## 2021-03-07 NOTE — Progress Notes (Signed)
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  3 cm Therapeutic Status: Postoperative without Neoadjuvant Therapy (Pathologic Staging) AJCC Grade: G3 AJCC N Category: pN0 AJCC M Category: cM0 ER Status: Positive (+) AJCC 8 Stage Grouping: IA HER2 Status: Positive (+) Oncotype Dx Recurrence Score: Not Appropriate AJCC T Category: pT2 PR Status: Positive (+) Adjuvant Therapy Status: No Adjuvant Therapy Received Yet or Changing Initial Adjuvant Regimen due to Tolerance Intent of Therapy: Curative Intent, Discussed with Patient

## 2021-03-07 NOTE — Patient Instructions (Signed)
-It was nice meeting you today. We covered a lot of important information today. Here is the summary from today.  -I need to arrange for baseline ultrasound of the heart. Be on the look out for a phone call from that department. We will do this every 3 months for 1 year.  -We are planning on starting your treatment on the week of 03/17/21. Before your start your treatment, I would like you to attend a Chemotherapy Education Class. This involves having you sit down with one of our nurse educators. She will discuss with your one-on-one more details about your treatment as well as general information about resources here at the [facility] treatment will be given weekly for 12 weeks with two medications (Taxol/Paclitaxel) and Anti-Her 2 medication (Herceptin). After you finish your 12 weeks of chemo, then you will be on herceptin every 3 weeks for 1 year. After you finish chemo, we will have you see Dr. Isidore Moos from radiation oncology for radiation. Lastly you will be on anti-estrogen treatment for 5-10 years.    Medications:  -I have sent a few important medication prescriptions to your pharmacy.  -Compazine was sent to your pharmacy. This medication is for nausea. You may take this every 6 hours as needed if you feel nausous.  -I will also send zofran which is every 8 hours if needed.   Follow up:  -We will see you back for a follow up visit in 2 weeks with your first treatment.    Trastuzumab injection for infusion What is this medicine? TRASTUZUMAB (tras TOO zoo mab) is a monoclonal antibody. It is used to treat breast cancer and stomach cancer. This medicine may be used for other purposes; ask your health care provider or pharmacist if you have questions. COMMON BRAND NAME(S): Herceptin, Galvin Proffer, Trazimera What should I tell my health care provider before I take this medicine? They need to know if you have any of these conditions:  heart disease  heart  failure  lung or breathing disease, like asthma  an unusual or allergic reaction to trastuzumab, benzyl alcohol, or other medications, foods, dyes, or preservatives  pregnant or trying to get pregnant  breast-feeding How should I use this medicine? This drug is given as an infusion into a vein. It is administered in a hospital or clinic by a specially trained health care professional. Talk to your pediatrician regarding the use of this medicine in children. This medicine is not approved for use in children. Overdosage: If you think you have taken too much of this medicine contact a poison control center or emergency room at once. NOTE: This medicine is only for you. Do not share this medicine with others. What if I miss a dose? It is important not to miss a dose. Call your doctor or health care professional if you are unable to keep an appointment. What may interact with this medicine? This medicine may interact with the following medications:  certain types of chemotherapy, such as daunorubicin, doxorubicin, epirubicin, and idarubicin This list may not describe all possible interactions. Give your health care provider a list of all the medicines, herbs, non-prescription drugs, or dietary supplements you use. Also tell them if you smoke, drink alcohol, or use illegal drugs. Some items may interact with your medicine. What should I watch for while using this medicine? Visit your doctor for checks on your progress. Report any side effects. Continue your course of treatment even though you feel ill unless  your doctor tells you to stop. Call your doctor or health care professional for advice if you get a fever, chills or sore throat, or other symptoms of a cold or flu. Do not treat yourself. Try to avoid being around people who are sick. You may experience fever, chills and shaking during your first infusion. These effects are usually mild and can be treated with other medicines. Report any side  effects during the infusion to your health care professional. Fever and chills usually do not happen with later infusions. Do not become pregnant while taking this medicine or for 7 months after stopping it. Women should inform their doctor if they wish to become pregnant or think they might be pregnant. Women of child-bearing potential will need to have a negative pregnancy test before starting this medicine. There is a potential for serious side effects to an unborn child. Talk to your health care professional or pharmacist for more information. Do not breast-feed an infant while taking this medicine or for 7 months after stopping it. Women must use effective birth control with this medicine. What side effects may I notice from receiving this medicine? Side effects that you should report to your doctor or health care professional as soon as possible:  allergic reactions like skin rash, itching or hives, swelling of the face, lips, or tongue  chest pain or palpitations  cough  dizziness  feeling faint or lightheaded, falls  fever  general ill feeling or flu-like symptoms  signs of worsening heart failure like breathing problems; swelling in your legs and feet  unusually weak or tired Side effects that usually do not require medical attention (report to your doctor or health care professional if they continue or are bothersome):  bone pain  changes in taste  diarrhea  joint pain  nausea/vomiting  weight loss This list may not describe all possible side effects. Call your doctor for medical advice about side effects. You may report side effects to FDA at 1-800-FDA-1088. Where should I keep my medicine? This drug is given in a hospital or clinic and will not be stored at home. NOTE: This sheet is a summary. It may not cover all possible information. If you have questions about this medicine, talk to your doctor, pharmacist, or health care provider.  2021 Elsevier/Gold Standard  (2016-11-24 14:37:52)

## 2021-03-10 ENCOUNTER — Encounter: Payer: Self-pay | Admitting: Physician Assistant

## 2021-03-10 ENCOUNTER — Encounter: Payer: Self-pay | Admitting: Physical Therapy

## 2021-03-10 ENCOUNTER — Other Ambulatory Visit: Payer: Self-pay

## 2021-03-10 ENCOUNTER — Encounter: Payer: Self-pay | Admitting: Hematology

## 2021-03-10 ENCOUNTER — Encounter: Payer: Self-pay | Admitting: *Deleted

## 2021-03-10 ENCOUNTER — Ambulatory Visit: Payer: BC Managed Care – PPO | Attending: General Surgery | Admitting: Physical Therapy

## 2021-03-10 DIAGNOSIS — Z483 Aftercare following surgery for neoplasm: Secondary | ICD-10-CM | POA: Insufficient documentation

## 2021-03-10 DIAGNOSIS — R293 Abnormal posture: Secondary | ICD-10-CM | POA: Insufficient documentation

## 2021-03-10 DIAGNOSIS — Z170 Estrogen receptor positive status [ER+]: Secondary | ICD-10-CM | POA: Diagnosis not present

## 2021-03-10 DIAGNOSIS — C50212 Malignant neoplasm of upper-inner quadrant of left female breast: Secondary | ICD-10-CM | POA: Insufficient documentation

## 2021-03-10 NOTE — Therapy (Signed)
[place], [place], [address] Phone: [phone]   Fax:  [phone]  Physical Therapy Treatment  Patient Details  Name: Aimee Mcclain MRN: [id number] Date of Birth: [birth year] Referring Provider (PT): Dr. Stark Klein   Encounter Date: 03/10/2021   PT End of Session - 03/10/21 1440    Visit Number 2    Number of Visits 2    PT Start Time 6606    PT Stop Time 1331    PT Time Calculation (min) 35 min    Activity Tolerance Patient tolerated treatment well    Behavior During Therapy [facility] for tasks assessed/performed           Past Medical History:  Diagnosis Date  . Breast cancer ([place])   . Cataract   . Colon polyps   . Endometriosis   . Family history of colon cancer   . Family history of colonic polyps   . GERD (gastroesophageal reflux disease)   . H/O bilateral breast implants   . History of colon polyps   . HPV in female   . HSV (herpes simplex virus) infection   . Migraine   . S/P TAH (total abdominal hysterectomy) 1991    Past Surgical History:  Procedure Laterality Date  . ABDOMINAL HYSTERECTOMY    . BREAST BIOPSY     Benign.  Marland Kitchen BREAST ENHANCEMENT SURGERY    . BREAST LUMPECTOMY Bilateral 1993 and 1995  . BREAST LUMPECTOMY WITH RADIOACTIVE SEED AND SENTINEL LYMPH NODE BIOPSY Left 02/13/2021   Procedure: LEFT BREAST LUMPECTOMY WITH RADIOACTIVE SEED AND SENTINEL LYMPH NODE BIOPSY WITH SEED TARGETED NODE;  Surgeon: Stark Klein, MD;  Location: [facility];  Service: General;  Laterality: Left;  . CESAREAN SECTION    . PARTIAL HYSTERECTOMY    . PORTACATH PLACEMENT Right 02/13/2021   Procedure: INSERTION PORT-A-CATH;  Surgeon: Stark Klein, MD;  Location: [facility];  Service: General;  Laterality: Right;  . TONSILLECTOMY    . WISDOM TOOTH EXTRACTION      There were no vitals filed for this visit.   Subjective Assessment - 03/10/21 1304     Subjective Patient underwent a left lumpectomy and sentinel node biopsy (4 negative nodes) on 02/13/2021. She has not seen Dr. Barry Dienes yet but will see her on 03/17/2021. She will begin chemotherapy sometime the week of 03/17/2021 as her cancer is HER2 positive.    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 Ki67 is 30%. Patient underwent a left lumpectomy and sentinel node biopsy (4 negative nodes) on 02/13/2021.    Patient Stated Goals Make sure my arm is doing ok    Currently in Pain? Yes    Pain Score 3     Pain Location Breast    Pain Orientation Left    Pain Descriptors / Indicators Shooting    Pain Type Surgical pain    Pain Onset 1 to 4 weeks ago    Pain Frequency Intermittent    Aggravating Factors  Unknown - it hits randomly    Pain Relieving Factors Nothing              [facility] PT Assessment - 03/10/21 0001      Assessment   Medical Diagnosis s/p left lumpectomy and SLNB    Referring Provider (PT) Dr. Stark Klein    Onset Date/Surgical Date 02/13/21    Hand Dominance Right  Prior Therapy Baselines      Precautions   Precautions Other (comment)    Precaution Comments recent surgery and left arm lymphedema risk      Restrictions   Weight Bearing Restrictions No      Balance Screen   Has the patient fallen in the past 6 months No    Has the patient had a decrease in activity level because of a fear of falling?  No    Is the patient reluctant to leave their home because of a fear of falling?  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      Cognition   Overall Cognitive Status Within Functional Limits for tasks assessed      Observation/Other Assessments   Observations Left breast and axillary incisions both appear to be healing well with no redness or edema present. Some mild scar tissue thickness present left axilla.      Posture/Postural Control   Posture/Postural Control Postural limitations    Postural Limitations Rounded Shoulders;Forward head      ROM / Strength   AROM / PROM / Strength AROM      AROM   AROM Assessment Site Shoulder    Right/Left Shoulder Left    Left Shoulder Extension 49 Degrees    Left Shoulder Flexion 137 Degrees    Left Shoulder ABduction 151 Degrees    Left Shoulder Internal Rotation 67 Degrees    Left Shoulder External Rotation 68 Degrees      Strength   Overall Strength Within functional limits for tasks performed             LYMPHEDEMA/ONCOLOGY QUESTIONNAIRE - 03/10/21 0001      Type   Cancer Type Left breast cancer      Surgeries   Lumpectomy Date 02/13/21    Sentinel Lymph Node Biopsy Date 02/13/21    Number Lymph Nodes Removed 4      Treatment   Active Chemotherapy Treatment No    Past Chemotherapy Treatment No    Active Radiation Treatment No    Past Radiation Treatment No    Current Hormone Treatment No    Past Hormone Therapy No      What other symptoms do you have   Are you Having Heaviness or Tightness No    Are you having Pain Yes    Are you having pitting edema No    Is it Hard or Difficult finding clothes that fit No    Do you have infections No    Is there Decreased scar mobility No    Stemmer Sign No      Lymphedema Assessments   Lymphedema Assessments Upper extremities      Right Upper Extremity Lymphedema   10 cm Proximal to Olecranon Process 31 cm    Olecranon Process 27.2 cm    10 cm Proximal to Ulnar Styloid Process 24 cm    Just Proximal to Ulnar Styloid Process 16.8 cm     Across Hand at  [employer] 20.4 cm    At [place] of 2nd Digit 7.1 cm      Left Upper Extremity Lymphedema   10 cm Proximal to Olecranon Process 29.8 cm    Olecranon Process 26 cm    10 cm Proximal to Ulnar Styloid Process 23 cm    Just Proximal to Ulnar Styloid Process 16.5 cm    Across Hand at [employer] 20.3 cm    At [place] of 2nd Digit 7 cm              Quick Dash - 03/10/21 0001    Open a tight or new jar Mild difficulty    Do heavy household chores (wash walls, wash floors) No difficulty    Carry a shopping bag or briefcase No difficulty    Wash your back No difficulty    Use a knife to cut food No difficulty    Recreational activities in which you take some force or impact through your arm, shoulder, or hand (golf, hammering, tennis) No difficulty    During the past week, to what extent has your arm, shoulder or hand problem interfered with your normal social activities with family, friends, neighbors, or groups? Not at all    During the past week, to what extent has your arm, shoulder or hand problem limited your work or other regular daily activities Not at all    Arm, shoulder, or hand pain. None    Tingling (pins and needles) in your arm, shoulder, or hand Mild    Difficulty Sleeping No difficulty    DASH Score 4.55 %                          PT Education - 03/10/21 1440    Education Details HEP and lymphedema education    Person(s) Educated Patient    Methods Explanation;Demonstration;Handout    Comprehension Returned demonstration;Verbalized understanding               PT Long Term Goals - 03/10/21 1444      PT LONG TERM GOAL #1   Title Patient will demonstrate she has regained full shoulder ROM and function post operatively compared to baselines.    Time 6    Period Months    Status Achieved                 Plan - 03/10/21 1440    Clinical Impression Statement Patient is doing very well s/p left lumpectomy and sentinel  node biopsy on 02/13/2021. She had 4 negative axillary nodes removed. She is scheduled to start chemotherapy in 2 weeks as her cancer is HER2 positive. Her shoulder ROM and function is back to baseline and she has no signs of lymphedema. She plans to attend the After Breast Cancer class but otherwise has no PT needs at this time.    PT Treatment/Interventions ADLs/Self Care Home Management;Therapeutic exercise;Patient/family education    PT Next Visit Plan D/C    PT Home Exercise Plan Post op shoulder ROM HEP    Consulted and Agree with Plan of Care Patient           Patient will benefit from skilled therapeutic intervention in order to improve the following deficits and impairments:  Postural dysfunction,Decreased range of motion,Decreased knowledge of precautions,Impaired UE functional use,Pain  Visit Diagnosis: Malignant neoplasm of upper-inner quadrant of left breast in female, estrogen receptor positive (HCC)  Abnormal posture  Aftercare following  surgery for neoplasm     Problem List Patient Active Problem List   Diagnosis Date Noted  . Malignant neoplasm of upper-inner quadrant of left breast in female, estrogen receptor positive ([place]) 01/16/2021  . Chronic migraine 08/21/2020  . URI (upper respiratory infection) 06/30/2020  . Screening mammogram, encounter for 06/30/2020  . Paresthesia 06/09/2020  . Neuropathy 02/14/2020  . Atypical chest pain 02/14/2020  . Healthcare maintenance 02/14/2020  . Migraine   . Advance care planning 01/16/2019  . Family history of colon cancer   . HPV in female    PHYSICAL THERAPY DISCHARGE SUMMARY  Visits from Start of Care: 2  Current functional level related to goals / functional outcomes: Goals met. See above for objective findings.   Remaining deficits: None   Education / Equipment: HEP and lymphedema education Plan: Patient agrees to discharge.  Patient goals were met. Patient is being discharged due to meeting the stated  rehab goals.  ?????         Annia Friendly, [place] 03/10/21 2:45 PM  [place], [place], [address] Phone: [phone]   Fax:  [phone]  Name: Aimee Loose Duerst MRN: [id number] Date of Birth: 02/26/1956

## 2021-03-10 NOTE — Patient Instructions (Signed)
Closed Chain: Shoulder Flexion / Extension - on Wall    Hands on wall, step backward. Return. Stepping causes shoulder flexion and extension Do ___ times, each foot, ___ times per day.  http://ss.exer.us/265   Copyright  VHI. All rights reserved.              [facility]         [address], [place] [address]         [phone]         Annia Friendly, PT, CLT   After Breast Cancer Class It is recommended you attend the ABC class to be educated on lymphedema risk reduction. This class is free of charge and lasts for 1 hour. It is a 1-time class.  You are scheduled for 03/31/2021 and we will send you a link for the 11:00 class.  Scar massage You can begin gentle scar massage with coconut oil or vitamin E cream a few minutes each day.   Compression garment Keep wearing your sports bra until you have no pain at all.   Home exercise Program Continue the exercises until you can do them without any tightness at all.   Follow up PT: It is recommended you return every 3 months for the first 3 years following surgery to be assessed on the SOZO machine for an L-Dex score. This helps prevent clinically significant lymphedema in 95% of patients. These follow up screens are 15 minute appointments that you are not billed for. You are scheduled for June 13th at 8:00 here at [address].

## 2021-03-11 ENCOUNTER — Telehealth: Payer: Self-pay | Admitting: *Deleted

## 2021-03-11 ENCOUNTER — Encounter: Payer: Self-pay | Admitting: *Deleted

## 2021-03-11 NOTE — Progress Notes (Signed)
Pharmacist Chemotherapy Monitoring - Initial Assessment    Anticipated start date: 03/18/21   Regimen:  . Are orders appropriate based on the patient's diagnosis, regimen, and cycle? Yes . Does the plan date match the patient's scheduled date? Yes . Is the sequencing of drugs appropriate? Yes . Are the premedications appropriate for the patient's regimen? Yes . Prior Authorization for treatment is: Approved o If applicable, is the correct biosimilar selected based on the patient's insurance? no  Organ Function and Labs: Marland Kitchen Are dose adjustments needed based on the patient's renal function, hepatic function, or hematologic function? Yes . Are appropriate labs ordered prior to the start of patient's treatment? Yes . Other organ system assessment, if indicated: trastuzumab: Echo/ MUGA . The following baseline labs, if indicated, have been ordered: N/A  Dose Assessment: . Are the drug doses appropriate? Yes . Are the following correct: o Drug concentrations Yes o IV fluid compatible with drug Yes o Administration routes Yes o Timing of therapy Yes . If applicable, does the patient have documented access for treatment and/or plans for port-a-cath placement? yes . If applicable, have lifetime cumulative doses been properly documented and assessed? yes Lifetime Dose Tracking  No doses have been documented on this patient for the following tracked chemicals: Doxorubicin, Epirubicin, Idarubicin, Daunorubicin, Mitoxantrone, Bleomycin, Oxaliplatin, Carboplatin, Liposomal Doxorubicin  o   Toxicity Monitoring/Prevention: . The patient has the following take home antiemetics prescribed: Ondansetron and Prochlorperazine . The patient has the following take home medications prescribed: N/A . Medication allergies and previous infusion related reactions, if applicable, have been reviewed and addressed. Yes . The patient's current medication list has been assessed for drug-drug interactions with their  chemotherapy regimen. no significant drug-drug interactions were identified on review.  Order Review: . Are the treatment plan orders signed? Yes . Is the patient scheduled to see a provider prior to their treatment? Yes  I verify that I have reviewed each item in the above checklist and answered each question accordingly.  Romualdo Bolk Onamia, [place], 03/11/2021  1:35 PM

## 2021-03-11 NOTE — Telephone Encounter (Signed)
Spoke to pt concerning 1st chemo on 4/5. Informed subsequent chemo appts will be made. Denies further questions or needs at this time.

## 2021-03-12 ENCOUNTER — Encounter: Payer: Self-pay | Admitting: Hematology

## 2021-03-12 ENCOUNTER — Other Ambulatory Visit: Payer: BC Managed Care – PPO

## 2021-03-12 ENCOUNTER — Inpatient Hospital Stay: Payer: BC Managed Care – PPO

## 2021-03-12 ENCOUNTER — Ambulatory Visit (HOSPITAL_COMMUNITY)
Admission: RE | Admit: 2021-03-12 | Discharge: 2021-03-12 | Disposition: A | Discharge status: Home / Self Care | Payer: BC Managed Care – PPO | Source: Ambulatory Visit | Attending: Physician Assistant | Admitting: Physician Assistant

## 2021-03-12 ENCOUNTER — Other Ambulatory Visit: Payer: Self-pay

## 2021-03-12 DIAGNOSIS — K219 Gastro-esophageal reflux disease without esophagitis: Secondary | ICD-10-CM | POA: Diagnosis not present

## 2021-03-12 DIAGNOSIS — C50912 Malignant neoplasm of unspecified site of left female breast: Secondary | ICD-10-CM | POA: Diagnosis not present

## 2021-03-12 DIAGNOSIS — Z01818 Encounter for other preprocedural examination: Secondary | ICD-10-CM | POA: Diagnosis not present

## 2021-03-12 DIAGNOSIS — Z170 Estrogen receptor positive status [ER+]: Secondary | ICD-10-CM | POA: Diagnosis not present

## 2021-03-12 DIAGNOSIS — Z0189 Encounter for other specified special examinations: Secondary | ICD-10-CM

## 2021-03-12 LAB — ECHOCARDIOGRAM COMPLETE
Area-P 1/2: 3.17 cm2
S' Lateral: 2.1 cm

## 2021-03-12 NOTE — Progress Notes (Signed)
  Echocardiogram 2D Echocardiogram has been performed.  Aimee Mcclain 03/12/2021, 9:48 AM

## 2021-03-12 NOTE — Progress Notes (Signed)
Met with patient at registration to introduce myself as Arboriculturist and to offer available resources.  Discussed one-time $1000 Radio broadcast assistant to assist with personal expenses while going through treatment.  Gave her my card if interested in applying and for any additional financial questions or concerns.

## 2021-03-14 ENCOUNTER — Telehealth: Payer: Self-pay | Admitting: *Deleted

## 2021-03-17 NOTE — Progress Notes (Signed)
[facility]   [phone] [phone]   Clinic Follow up Note   Patient Care Team: [name], MD as PCP - General (Family Medicine) Rockwell Germany, RN as Oncology Nurse Navigator Mauro Kaufmann, RN as Oncology Nurse Navigator Stark Klein, MD as Consulting Physician (General Surgery) Truitt Merle, MD as Consulting Physician (Hematology) Eppie Gibson, MD as Attending Physician (Radiation Oncology) 03/18/2021  CHIEF COMPLAINT: Follow up left breast cancer   SUMMARY OF ONCOLOGIC HISTORY: Oncology History Overview Note  Cancer Staging Malignant neoplasm of upper-inner quadrant of left breast in female, estrogen receptor positive ([place]) Staging form: Breast, AJCC 8th Edition - Clinical stage from 01/14/2020: Stage IB (cT1c, cN1, cM0, G3, ER+, PR+, HER2+) - Signed by Truitt Merle, MD on 01/21/2021 Stage prefix: Initial diagnosis    Malignant neoplasm of upper-inner quadrant of left breast in female, estrogen receptor positive ([place])  01/14/2020 Cancer Staging   Staging form: Breast, AJCC 8th Edition - Clinical stage from 01/14/2020: Stage IB (cT1c, cN1, cM0, G3, ER+, PR+, HER2+) - Signed by Truitt Merle, MD on 01/21/2021 Stage prefix: Initial diagnosis   01/02/2021 Mammogram   Left breast with 1.9x1x1.8 cm complex cyst at 12:00 position, 4cmfn, versus saloid mass with calcifications, irregulr, increased in size. Biopsy recommended.    01/13/2021 Initial Biopsy   Diagnosis 1. Breast, left, needle core biopsy, 11 o'clock, 4cmfn - INVASIVE DUCTAL CARCINOMA - SEE COMMENT 2. Lymph node, needle/core biopsy, left axilla - FIBROVASCULAR AND ADIPOSE TISSUE - NO LYMPHOID TISSUE OR CARCINOMA IDENTIFIED Microscopic Comment 1. Based on the biopsy, the carcinoma appears Nottingham grade 3 of 3 and measures 1.2 cm in greatest linear extent. Prognostic markers (ER/PR/ki-67/HER2) are pending and will be reported in an addendum. Dr. Jeannie Done reviewed the case and agrees  with the above diagnosis. These results were called to [facility] on January 14, 2021   01/13/2021 Receptors her2   1. PROGNOSTIC INDICATORS Results: IMMUNOHISTOCHEMICAL AND MORPHOMETRIC ANALYSIS PERFORMED MANUALLY The tumor cells are POSITIVE for Her2 (3+). Estrogen Receptor: 95%, POSITIVE, STRONG STAINING INTENSITY Progesterone Receptor: 85%, POSITIVE, STRONG STAINING INTENSITY Proliferation Marker Ki67: 30%   01/16/2021 Initial Diagnosis   Malignant neoplasm of upper-inner quadrant of left breast in female, estrogen receptor positive ([place])   01/23/2021 Imaging   MRI Breast  IMPRESSION: 1. Known malignancy measuring 2.9 centimeters in the UPPER INNER QUADRANT of the LEFT breast. There is no evidence for involvement of the pectoralis muscle or implant. 2. No evidence for lymphadenopathy. 3. RIGHT breast is negative. 4. Bilateral retropectoral saline implants.   02/09/2021 Imaging   MRI Brain IMPRESSION: No evidence of intracranial metastatic disease.   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     02/13/2021 Surgery   LEFT BREAST LUMPECTOMY WITH RADIOACTIVE SEED AND SENTINEL LYMPH NODE BIOPSY WITH SEED TARGETED NODE and PAC Placed by Dr Barry Dienes   02/13/2021 Pathology Results   FINAL MICROSCOPIC DIAGNOSIS:   A. BREAST, LEFT, LUMPECTOMY:  - Invasive ductal carcinoma, 2.6 cm, Nottingham grade 3 of 3.  - Ductal carcinoma in situ, high grade with central necrosis.  - Margins of resection:       - Invasive carcinoma broadly involves the anterior and inferior  margins.  Invasive carcinoma is focally < 1 mm from each the superior  and medial margins.       - DCIS is focally < 1 mm from the inferior margin.  DCIS is focally  1 mm from each the anterior and superior margins.  DCIS is focally 1-2  mm from the medial margin.  - Biopsy clip.  - See oncology table.   B. BREAST, LEFT, ANTERIOR TISSUE, EXCISION:  - Residual invasive and in-situ ductal carcinoma.  - Residual  invasive ductal carcinoma broadly involves the posterior  margin.  - Residual DCIS is focally 1 mm from the posterior margin.   C. BREAST, LEFT, INFERIOR MARGIN, EXCISION:  - No carcinoma identified.   D. SENTINEL LYMPH NODE, LEFT AXILLARY #1, BIOPSY:  - No carcinoma identified in one lymph node (0/1).   E. SENTINEL LYMPH NODE, LEFT AXILLARY #2, BIOPSY:  - Biopsy site.  - No distinct nodal tissue identified.  - No carcinoma identified.   F. SENTINEL LYMPH NODE, LEFT AXILLARY #3, BIOPSY:  - No carcinoma identified in one lymph node (0/1).   G. SENTINEL LYMPH NODE, LEFT AXILLARY #4, BIOPSY:  - No carcinoma identified in one lymph node (0/1).    02/13/2021 Cancer Staging   Staging form: Breast, AJCC 8th Edition - Pathologic stage from 02/13/2021: Stage IA (pT2, pN0, cM0, G3, ER+, PR+, HER2+) - Signed by Heilingoetter, Cassandra L, PA-C on 03/07/2021 Stage prefix: Initial diagnosis Nuclear grade: G3 Histologic grading system: 3 grade system   03/18/2021 -  Chemotherapy    Patient is on Treatment Plan: BREAST PACLITAXEL + TRASTUZUMAB Q7D / TRASTUZUMAB Q21D        CURRENT THERAPY: PENDING adjuvant chemo weekly taxol and herceptin q12 weeks followed by maintenance herceptin q3 weeks to complete 1 year  INTERVAL HISTORY: Aimee Mcclain returns for follow up and treatment as scheduled, accompanied by her spouse.  She is doing well, continues working and is doing exercise.  Eating and drinking well.  Seroma in the left breast is softer, she had follow-up with Dr. Barry Dienes yesterday.  She has occasional breast pain that is manageable.  Incisions healed well.  The port site is stinging a little, she notes a history of reaction to adhesive tape.  She is having mild but tolerable hot flashes.  She attended chemo education and picked up antiemetics.  She feels ready for treatment.  She has had neuropathy in her feet for about a year that worsened in the last 6 months, feels like she is "walking on  bubbles."  She started taking B complex vitamin.  Denies fever, chills, cough, chest pain, dyspnea, nausea/vomiting, new pain, or other concerns.   MEDICAL HISTORY:  Past Medical History:  Diagnosis Date  . Breast cancer ([place])   . Cataract   . Colon polyps   . Endometriosis   . Family history of colon cancer   . Family history of colonic polyps   . GERD (gastroesophageal reflux disease)   . H/O bilateral breast implants   . History of colon polyps   . HPV in female   . HSV (herpes simplex virus) infection   . Migraine   . S/P TAH (total abdominal hysterectomy) 1991    SURGICAL HISTORY: Past Surgical History:  Procedure Laterality Date  . ABDOMINAL HYSTERECTOMY    . BREAST BIOPSY     Benign.  [facility] Kitchen BREAST ENHANCEMENT SURGERY    . BREAST LUMPECTOMY Bilateral 1993 and 1995  . BREAST LUMPECTOMY WITH RADIOACTIVE SEED AND SENTINEL LYMPH NODE BIOPSY Left 02/13/2021   Procedure: LEFT BREAST LUMPECTOMY WITH RADIOACTIVE SEED AND SENTINEL LYMPH NODE BIOPSY WITH SEED TARGETED NODE;  Surgeon: Stark Klein, MD;  Location: [facility];  Service: General;  Laterality: Left;  . CESAREAN SECTION    .  PARTIAL HYSTERECTOMY    . PORTACATH PLACEMENT Right 02/13/2021   Procedure: INSERTION PORT-A-CATH;  Surgeon: Stark Klein, MD;  Location: [facility];  Service: General;  Laterality: Right;  . TONSILLECTOMY    . WISDOM TOOTH EXTRACTION      I have reviewed the social history and family history with the patient and they are unchanged from previous note.  ALLERGIES:  is allergic to ibuprofen, omnicef [cefdinir], proton pump inhibitors, and vioxx [rofecoxib].  MEDICATIONS:  Current Outpatient Medications  Medication Sig Dispense Refill  . acetaminophen (TYLENOL) 500 MG tablet Take 1,000 mg by mouth every 6 (six) hours as needed.    . Calcium 600-200 MG-UNIT tablet Take 1 tablet by mouth daily.    . calcium carbonate (TUMS - DOSED IN MG ELEMENTAL CALCIUM) 500 MG chewable  tablet Chew 1 tablet by mouth as needed for indigestion or heartburn. Takes 5-6 per day    . lidocaine-prilocaine (EMLA) cream Apply 1 application topically as needed. 30 g 2  . Multiple Vitamin (MULTIVITAMIN) tablet Take 1 tablet by mouth daily.    . ondansetron (ZOFRAN) 8 MG tablet Take 1 tablet (8 mg total) by mouth every 8 (eight) hours as needed for nausea or vomiting. 30 tablet 2  . oxyCODONE (OXY IR/ROXICODONE) 5 MG immediate release tablet Take 1 tablet (5 mg total) by mouth every 6 (six) hours as needed for severe pain. 10 tablet 0  . prochlorperazine (COMPAZINE) 10 MG tablet Take 1 tablet (10 mg total) by mouth every 6 (six) hours as needed. 30 tablet 2  . valACYclovir (VALTREX) 500 MG tablet Take 1 tablet (500 mg total) by mouth 2 (two) times daily as needed.    . vitamin B-12 (CYANOCOBALAMIN) 1000 MCG tablet Take 1 tablet (1,000 mcg total) by mouth daily.     No current facility-administered medications for this visit.   Facility-Administered Medications Ordered in Other Visits  Medication Dose Route Frequency Provider Last Rate Last Admin  . dexamethasone (DECADRON) 10 mg in sodium chloride 0.9 % 50 mL IVPB  10 mg Intravenous Once Truitt Merle, MD 204 mL/hr at 03/18/21 1038 10 mg at 03/18/21 1038  . heparin lock flush 100 unit/mL  500 Units Intracatheter Once PRN Truitt Merle, MD      . PACLitaxel (TAXOL) 162 mg in sodium chloride 0.9 % 250 mL chemo infusion (</= 65m/m2)  80 mg/m2 (Treatment Plan Recorded) Intravenous Once FTruitt Merle MD      . sodium chloride flush (NS) 0.9 % injection 10 mL  10 mL Intracatheter PRN FTruitt Merle MD      . trastuzumab-dkst (OGIVRI) 357 mg in sodium chloride 0.9 % 250 mL chemo infusion  4 mg/kg (Treatment Plan Recorded) Intravenous Once FTruitt Merle MD        PHYSICAL EXAMINATION: ECOG PERFORMANCE STATUS: 0 - Asymptomatic  Vitals:   03/18/21 0932  BP: (!) 153/83  Pulse: 77  Resp: 16  Temp: 97.9 F (36.6 C)  SpO2: 98%   Filed Weights   03/18/21 0932   Weight: 202 lb 9.6 oz (91.9 kg)    GENERAL:alert, no distress and comfortable SKIN: No rash EYES: sclera clear NECK: Without mass LUNGS: clear with normal breathing effort HEART: regular rate & rhythm, no lower extremity edema NEURO: alert & oriented x 3 with fluent speech, no focal motor deficits.  Intact peripheral vibratory sense over the feet per tuning fork exam PAC without erythema Breast: S/p left lumpectomy, axillary and areolar incisions healed well.  Soft  palpable seroma in the upper central breast  LABORATORY DATA:  I have reviewed the data as listed CBC Latest Ref Rng & Units 03/18/2021 01/22/2021 02/12/2020  WBC 4.0 - 10.5 K/uL 5.6 4.4 5.9  Hemoglobin 12.0 - 15.0 g/dL 13.9 14.1 13.9  Hematocrit 36.0 - 46.0 % 41.9 42.4 41.3  Platelets 150 - 400 K/uL 202 230 174.0     CMP Latest Ref Rng & Units 03/18/2021 01/22/2021 08/21/2020  Glucose 70 - 99 mg/dL 81 89 -  BUN 8 - 23 mg/dL 14 17 -  Creatinine 0.44 - 1.00 mg/dL 0.81 0.83 -  Sodium 135 - 145 mmol/L 144 142 -  Potassium 3.5 - 5.1 mmol/L 3.9 4.0 -  Chloride 98 - 111 mmol/L 107 108 -  CO2 22 - 32 mmol/L 25 27 -  Calcium 8.9 - 10.3 mg/dL 9.0 9.5 -  Total Protein 6.5 - 8.1 g/dL 6.9 7.4 6.8  Total Bilirubin 0.3 - 1.2 mg/dL 0.4 0.4 -  Alkaline Phos 38 - 126 U/L 102 101 -  AST 15 - 41 U/L 20 26 -  ALT 0 - 44 U/L 23 28 -      RADIOGRAPHIC STUDIES: I have personally reviewed the radiological images as listed and agreed with the findings in the report. No results found.   ASSESSMENT & PLAN: 65 year old postmenopausal female  1.  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 Barry Dienes -Adjuvant therapy with weekly Taxol and Herceptin followed by maintenance Herceptin every 3 weeks to complete 1 year was recommended to reduce her risk of recurrence. Given her tumor < 3 cm  and negative lymph node, Dr. Burr Medico did not recommend more intensive chemo. -Staging work-up showed small indeterminate lung nodules and an indeterminate left parietal bone lesion which will monitor with future scans, otherwise negative for distant metastasis -Baseline echo showed EF 60-65 % with GLS -22.4%, will monitor every 3 months on Herceptin -Her treatment plan includes adjuvant radiation after chemotherapy, and antiestrogen to follow  2.  Peripheral neuropathy -She has baseline neuropathy in her feet, etiology unknown -She is not diabetic, unaware of B12 deficiency but level is pending -No functional deficits -Taking B complex vitamin -I recommend cryotherapy during Taxol infusions -We will monitor closely on chemo.  We discussed the possibility of dose reducing or changing treatment if needed  3.  Vasomotor symptoms of menopause: Hot flashes -She was previously on HRT which she stopped with cancer diagnosis in 12/2020 -Hot flashes are mild and tolerable at this time -We discussed SSRI versus gabapentin, she declined for now but may be interested if she has increased hot flashes on antiestrogen therapy -May consider starting gabapentin sooner if she has progressive neuropathy on chemo  Disposition: Mr. Chaudhari appears stable.  She has recovered well from lumpectomy.  She has adequate nutrition/hydration and good performance status.  CBC and CMP reviewed, B12 is pending.  I reviewed her echo which shows normal ejection fraction. We again reviewed the rationale, potential risk/benefit, toxicities, and curative goal of adjuvant therapy.  She will proceed with cycle 1 of weekly Taxol and Herceptin today.  She will do cryotherapy during Taxol to prevent further neuropathy.  We discussed other symptom management.  Follow-up next week with cycle 2 Taxol/Herceptin.  All questions were answered. The patient knows to call the clinic with any problems, questions or concerns. No barriers to  learning were detected.     Aimee Feeling, NP 03/18/21

## 2021-03-18 ENCOUNTER — Inpatient Hospital Stay: Payer: BC Managed Care – PPO

## 2021-03-18 ENCOUNTER — Other Ambulatory Visit: Payer: Self-pay

## 2021-03-18 ENCOUNTER — Other Ambulatory Visit: Payer: Self-pay | Admitting: Hematology

## 2021-03-18 ENCOUNTER — Inpatient Hospital Stay: Payer: BC Managed Care – PPO | Attending: Hematology | Admitting: Nurse Practitioner

## 2021-03-18 ENCOUNTER — Encounter: Payer: Self-pay | Admitting: *Deleted

## 2021-03-18 ENCOUNTER — Encounter: Payer: Self-pay | Admitting: Nurse Practitioner

## 2021-03-18 VITALS — BP 153/83 | HR 77 | Temp 97.9°F | Resp 16 | Ht 67.0 in | Wt 202.6 lb

## 2021-03-18 VITALS — BP 161/88 | HR 87 | Temp 99.0°F | Resp 18

## 2021-03-18 DIAGNOSIS — C50212 Malignant neoplasm of upper-inner quadrant of left female breast: Secondary | ICD-10-CM | POA: Diagnosis not present

## 2021-03-18 DIAGNOSIS — Z170 Estrogen receptor positive status [ER+]: Secondary | ICD-10-CM

## 2021-03-18 DIAGNOSIS — Z95828 Presence of other vascular implants and grafts: Secondary | ICD-10-CM | POA: Insufficient documentation

## 2021-03-18 DIAGNOSIS — Z5111 Encounter for antineoplastic chemotherapy: Secondary | ICD-10-CM | POA: Insufficient documentation

## 2021-03-18 DIAGNOSIS — G620 Drug-induced polyneuropathy: Secondary | ICD-10-CM | POA: Insufficient documentation

## 2021-03-18 DIAGNOSIS — Z79899 Other long term (current) drug therapy: Secondary | ICD-10-CM | POA: Insufficient documentation

## 2021-03-18 DIAGNOSIS — Z5112 Encounter for antineoplastic immunotherapy: Secondary | ICD-10-CM | POA: Diagnosis not present

## 2021-03-18 DIAGNOSIS — C50912 Malignant neoplasm of unspecified site of left female breast: Secondary | ICD-10-CM

## 2021-03-18 DIAGNOSIS — R112 Nausea with vomiting, unspecified: Secondary | ICD-10-CM

## 2021-03-18 HISTORY — DX: Presence of other vascular implants and grafts: Z95.828

## 2021-03-18 LAB — CMP (CANCER CENTER ONLY)
ALT: 23 U/L (ref 0–44)
AST: 20 U/L (ref 15–41)
Albumin: 4.2 g/dL (ref 3.5–5.0)
Alkaline Phosphatase: 102 U/L (ref 38–126)
Anion gap: 12 (ref 5–15)
BUN: 14 mg/dL (ref 8–23)
CO2: 25 mmol/L (ref 22–32)
Calcium: 9 mg/dL (ref 8.9–10.3)
Chloride: 107 mmol/L (ref 98–111)
Creatinine: 0.81 mg/dL (ref 0.44–1.00)
GFR, Estimated: >60 mL/min (ref >60)
Glucose, Bld: 81 mg/dL (ref 70–99)
Potassium: 3.9 mmol/L (ref 3.5–5.1)
Sodium: 144 mmol/L (ref 135–145)
Total Bilirubin: 0.4 mg/dL (ref 0.3–1.2)
Total Protein: 6.9 g/dL (ref 6.5–8.1)

## 2021-03-18 LAB — CBC WITH DIFFERENTIAL (CANCER CENTER ONLY)
Abs Immature Granulocytes: 0.01 10*3/uL (ref 0.00–0.07)
Basophils Absolute: 0 10*3/uL (ref 0.0–0.1)
Basophils Relative: 1 %
Eosinophils Absolute: 0.1 10*3/uL (ref 0.0–0.5)
Eosinophils Relative: 2 %
HCT: 41.9 % (ref 36.0–46.0)
Hemoglobin: 13.9 g/dL (ref 12.0–15.0)
Immature Granulocytes: 0 %
Lymphocytes Relative: 37 %
Lymphs Abs: 2.1 10*3/uL (ref 0.7–4.0)
MCH: 29.1 pg (ref 26.0–34.0)
MCHC: 33.2 g/dL (ref 30.0–36.0)
MCV: 87.7 fL (ref 80.0–100.0)
Monocytes Absolute: 0.6 10*3/uL (ref 0.1–1.0)
Monocytes Relative: 10 %
Neutro Abs: 2.9 10*3/uL (ref 1.7–7.7)
Neutrophils Relative %: 50 %
Platelet Count: 202 10*3/uL (ref 150–400)
RBC: 4.78 MIL/uL (ref 3.87–5.11)
RDW: 12.4 % (ref 11.5–15.5)
WBC Count: 5.6 10*3/uL (ref 4.0–10.5)
nRBC: 0 % (ref 0.0–0.2)

## 2021-03-18 LAB — VITAMIN B12: Vitamin B-12: 579 pg/mL (ref 180–914)

## 2021-03-18 MED ORDER — DIPHENHYDRAMINE HCL 50 MG/ML IJ SOLN
25.0000 mg | Freq: Once | INTRAMUSCULAR | Status: AC
  Administered 2021-03-18: 25 mg via INTRAVENOUS

## 2021-03-18 MED ORDER — SODIUM CHLORIDE 0.9% FLUSH
10.0000 mL | INTRAVENOUS | Status: DC | PRN
  Administered 2021-03-18: 10 mL
  Filled 2021-03-18: qty 10

## 2021-03-18 MED ORDER — SODIUM CHLORIDE 0.9 % IV SOLN
10.0000 mg | Freq: Once | INTRAVENOUS | Status: AC
  Administered 2021-03-18: 10 mg via INTRAVENOUS
  Filled 2021-03-18: qty 10

## 2021-03-18 MED ORDER — FAMOTIDINE IN NACL 20-0.9 MG/50ML-% IV SOLN
INTRAVENOUS | Status: AC
  Filled 2021-03-18: qty 50

## 2021-03-18 MED ORDER — ACETAMINOPHEN 325 MG PO TABS
650.0000 mg | ORAL_TABLET | Freq: Once | ORAL | Status: AC
  Administered 2021-03-18: 650 mg via ORAL

## 2021-03-18 MED ORDER — DIPHENHYDRAMINE HCL 50 MG/ML IJ SOLN
INTRAMUSCULAR | Status: AC
  Filled 2021-03-18: qty 1

## 2021-03-18 MED ORDER — HEPARIN SOD (PORK) LOCK FLUSH 100 UNIT/ML IV SOLN
500.0000 [IU] | Freq: Once | INTRAVENOUS | Status: AC | PRN
  Administered 2021-03-18: 500 [IU]
  Filled 2021-03-18: qty 5

## 2021-03-18 MED ORDER — FAMOTIDINE IN NACL 20-0.9 MG/50ML-% IV SOLN
20.0000 mg | Freq: Once | INTRAVENOUS | Status: AC
  Administered 2021-03-18: 20 mg via INTRAVENOUS

## 2021-03-18 MED ORDER — SODIUM CHLORIDE 0.9% FLUSH
10.0000 mL | Freq: Once | INTRAVENOUS | Status: AC
  Administered 2021-03-18: 10 mL
  Filled 2021-03-18: qty 10

## 2021-03-18 MED ORDER — SODIUM CHLORIDE 0.9 % IV SOLN
80.0000 mg/m2 | Freq: Once | INTRAVENOUS | Status: AC
  Administered 2021-03-18: 162 mg via INTRAVENOUS
  Filled 2021-03-18: qty 27

## 2021-03-18 MED ORDER — SODIUM CHLORIDE 0.9 % IV SOLN
Freq: Once | INTRAVENOUS | Status: AC
  Filled 2021-03-18: qty 250

## 2021-03-18 MED ORDER — SODIUM CHLORIDE 0.9 % IV SOLN
4.0000 mg/kg | Freq: Once | INTRAVENOUS | Status: AC
  Administered 2021-03-18: 357 mg via INTRAVENOUS
  Filled 2021-03-18: qty 17

## 2021-03-18 NOTE — Patient Instructions (Signed)
[facility] Discharge Instructions for Patients Receiving Chemotherapy  Today you received the following chemotherapy agents Traztuzumab(Ogivri/Herceptin), Paclitaxel (Taxol).  To help prevent nausea and vomiting after your treatment, we encourage you to take your nausea medication as directed.   If you develop nausea and vomiting that is not controlled by your nausea medication, call the clinic.   BELOW ARE SYMPTOMS THAT SHOULD BE REPORTED IMMEDIATELY:  *FEVER GREATER THAN 100.5 F  *CHILLS WITH OR WITHOUT FEVER  NAUSEA AND VOMITING THAT IS NOT CONTROLLED WITH YOUR NAUSEA MEDICATION  *UNUSUAL SHORTNESS OF BREATH  *UNUSUAL BRUISING OR BLEEDING  TENDERNESS IN MOUTH AND THROAT WITH OR WITHOUT PRESENCE OF ULCERS  *URINARY PROBLEMS  *BOWEL PROBLEMS  UNUSUAL RASH Items with * indicate a potential emergency and should be followed up as soon as possible.  Feel free to call the clinic should you have any questions or concerns. The clinic phone number is [phone].  Please show the [place] at check-in to the Emergency Department and triage nurse.

## 2021-03-24 NOTE — Progress Notes (Signed)
[facility]   [phone] [phone]   Clinic Follow up Note   Patient Care Team: [name], MD as PCP - General (Family Medicine) Rockwell Germany, RN as Oncology Nurse Navigator Mauro Kaufmann, RN as Oncology Nurse Navigator Stark Klein, MD as Consulting Physician (General Surgery) Truitt Merle, MD as Consulting Physician (Hematology) Eppie Gibson, MD as Attending Physician (Radiation Oncology) 03/25/2021  CHIEF COMPLAINT:   SUMMARY OF ONCOLOGIC HISTORY: Oncology History Overview Note  Cancer Staging Malignant neoplasm of upper-inner quadrant of left breast in female, estrogen receptor positive ([place]) Staging form: Breast, AJCC 8th Edition - Clinical stage from 01/14/2020: Stage IB (cT1c, cN1, cM0, G3, ER+, PR+, HER2+) - Signed by Truitt Merle, MD on 01/21/2021 Stage prefix: Initial diagnosis    Malignant neoplasm of upper-inner quadrant of left breast in female, estrogen receptor positive ([place])  01/14/2020 Cancer Staging   Staging form: Breast, AJCC 8th Edition - Clinical stage from 01/14/2020: Stage IB (cT1c, cN1, cM0, G3, ER+, PR+, HER2+) - Signed by Truitt Merle, MD on 01/21/2021 Stage prefix: Initial diagnosis   01/02/2021 Mammogram   Left breast with 1.9x1x1.8 cm complex cyst at 12:00 position, 4cmfn, versus saloid mass with calcifications, irregulr, increased in size. Biopsy recommended.    01/13/2021 Initial Biopsy   Diagnosis 1. Breast, left, needle core biopsy, 11 o'clock, 4cmfn - INVASIVE DUCTAL CARCINOMA - SEE COMMENT 2. Lymph node, needle/core biopsy, left axilla - FIBROVASCULAR AND ADIPOSE TISSUE - NO LYMPHOID TISSUE OR CARCINOMA IDENTIFIED Microscopic Comment 1. Based on the biopsy, the carcinoma appears Nottingham grade 3 of 3 and measures 1.2 cm in greatest linear extent. Prognostic markers (ER/PR/ki-67/HER2) are pending and will be reported in an addendum. Dr. Jeannie Done reviewed the case and agrees with the above diagnosis.  These results were called to [facility] on January 14, 2021   01/13/2021 Receptors her2   1. PROGNOSTIC INDICATORS Results: IMMUNOHISTOCHEMICAL AND MORPHOMETRIC ANALYSIS PERFORMED MANUALLY The tumor cells are POSITIVE for Her2 (3+). Estrogen Receptor: 95%, POSITIVE, STRONG STAINING INTENSITY Progesterone Receptor: 85%, POSITIVE, STRONG STAINING INTENSITY Proliferation Marker Ki67: 30%   01/16/2021 Initial Diagnosis   Malignant neoplasm of upper-inner quadrant of left breast in female, estrogen receptor positive ([place])   01/23/2021 Imaging   MRI Breast  IMPRESSION: 1. Known malignancy measuring 2.9 centimeters in the UPPER INNER QUADRANT of the LEFT breast. There is no evidence for involvement of the pectoralis muscle or implant. 2. No evidence for lymphadenopathy. 3. RIGHT breast is negative. 4. Bilateral retropectoral saline implants.   02/09/2021 Imaging   MRI Brain IMPRESSION: No evidence of intracranial metastatic disease.   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     02/13/2021 Surgery   LEFT BREAST LUMPECTOMY WITH RADIOACTIVE SEED AND SENTINEL LYMPH NODE BIOPSY WITH SEED TARGETED NODE and PAC Placed by Dr Barry Dienes   02/13/2021 Pathology Results   FINAL MICROSCOPIC DIAGNOSIS:   A. BREAST, LEFT, LUMPECTOMY:  - Invasive ductal carcinoma, 2.6 cm, Nottingham grade 3 of 3.  - Ductal carcinoma in situ, high grade with central necrosis.  - Margins of resection:       - Invasive carcinoma broadly involves the anterior and inferior  margins.  Invasive carcinoma is focally < 1 mm from each the superior  and medial margins.       - DCIS is focally < 1 mm from the inferior margin.  DCIS is focally  1 mm from each the anterior and superior margins.  DCIS is focally 1-2  mm from the medial margin.  - Biopsy clip.  - See oncology table.   B. BREAST, LEFT, ANTERIOR TISSUE, EXCISION:  - Residual invasive and in-situ ductal carcinoma.  - Residual invasive ductal carcinoma  broadly involves the posterior  margin.  - Residual DCIS is focally 1 mm from the posterior margin.   C. BREAST, LEFT, INFERIOR MARGIN, EXCISION:  - No carcinoma identified.   D. SENTINEL LYMPH NODE, LEFT AXILLARY #1, BIOPSY:  - No carcinoma identified in one lymph node (0/1).   E. SENTINEL LYMPH NODE, LEFT AXILLARY #2, BIOPSY:  - Biopsy site.  - No distinct nodal tissue identified.  - No carcinoma identified.   F. SENTINEL LYMPH NODE, LEFT AXILLARY #3, BIOPSY:  - No carcinoma identified in one lymph node (0/1).   G. SENTINEL LYMPH NODE, LEFT AXILLARY #4, BIOPSY:  - No carcinoma identified in one lymph node (0/1).    02/13/2021 Cancer Staging   Staging form: Breast, AJCC 8th Edition - Pathologic stage from 02/13/2021: Stage IA (pT2, pN0, cM0, G3, ER+, PR+, HER2+) - Signed by Heilingoetter, Cassandra L, PA-C on 03/07/2021 Stage prefix: Initial diagnosis Nuclear grade: G3 Histologic grading system: 3 grade system   03/18/2021 -  Chemotherapy    Patient is on Treatment Plan: BREAST PACLITAXEL + TRASTUZUMAB Q7D / TRASTUZUMAB Q21D        CURRENT THERAPY:   INTERVAL HISTORY: She started first cycle chemo last week, tolerated well, had mild join pain after chemo, resolved, no N/V, BM normal. Hemorrhea bleeding has been more lately. NO other complains    All other systems were reviewed with the patient and are negative.  MEDICAL HISTORY:  Past Medical History:  Diagnosis Date  . Breast cancer ([place])   . Cataract   . Colon polyps   . Endometriosis   . Family history of colon cancer   . Family history of colonic polyps   . GERD (gastroesophageal reflux disease)   . H/O bilateral breast implants   . History of colon polyps   . HPV in female   . HSV (herpes simplex virus) infection   . Migraine   . S/P TAH (total abdominal hysterectomy) 1991    SURGICAL HISTORY: Past Surgical History:  Procedure Laterality Date  . ABDOMINAL HYSTERECTOMY    . BREAST BIOPSY     Benign.  Marland Kitchen  BREAST ENHANCEMENT SURGERY    . BREAST LUMPECTOMY Bilateral 1993 and 1995  . BREAST LUMPECTOMY WITH RADIOACTIVE SEED AND SENTINEL LYMPH NODE BIOPSY Left 02/13/2021   Procedure: LEFT BREAST LUMPECTOMY WITH RADIOACTIVE SEED AND SENTINEL LYMPH NODE BIOPSY WITH SEED TARGETED NODE;  Surgeon: Stark Klein, MD;  Location: [facility];  Service: General;  Laterality: Left;  . CESAREAN SECTION    . PARTIAL HYSTERECTOMY    . PORTACATH PLACEMENT Right 02/13/2021   Procedure: INSERTION PORT-A-CATH;  Surgeon: Stark Klein, MD;  Location: [facility];  Service: General;  Laterality: Right;  . TONSILLECTOMY    . WISDOM TOOTH EXTRACTION      I have reviewed the social history and family history with the patient and they are unchanged from previous note.  ALLERGIES:  is allergic to ibuprofen, omnicef [cefdinir], proton pump inhibitors, and vioxx [rofecoxib].  MEDICATIONS:  Current Outpatient Medications  Medication Sig Dispense Refill  . acetaminophen (TYLENOL) 500 MG tablet Take 1,000 mg by mouth every 6 (six) hours as needed.    . Calcium 600-200 MG-UNIT tablet Take 1 tablet by mouth daily.    Marland Kitchen  calcium carbonate (TUMS - DOSED IN MG ELEMENTAL CALCIUM) 500 MG chewable tablet Chew 1 tablet by mouth as needed for indigestion or heartburn. Takes 5-6 per day    . lidocaine-prilocaine (EMLA) cream Apply 1 application topically as needed. 30 g 2  . Multiple Vitamin (MULTIVITAMIN) tablet Take 1 tablet by mouth daily.    . ondansetron (ZOFRAN) 8 MG tablet Take 1 tablet (8 mg total) by mouth every 8 (eight) hours as needed for nausea or vomiting. 30 tablet 2  . oxyCODONE (OXY IR/ROXICODONE) 5 MG immediate release tablet Take 1 tablet (5 mg total) by mouth every 6 (six) hours as needed for severe pain. 10 tablet 0  . prochlorperazine (COMPAZINE) 10 MG tablet Take 1 tablet (10 mg total) by mouth every 6 (six) hours as needed. 30 tablet 2  . valACYclovir (VALTREX) 500 MG tablet Take 1 tablet  (500 mg total) by mouth 2 (two) times daily as needed.    . vitamin B-12 (CYANOCOBALAMIN) 1000 MCG tablet Take 1 tablet (1,000 mcg total) by mouth daily.     No current facility-administered medications for this visit.    PHYSICAL EXAMINATION: ECOG PERFORMANCE STATUS: 0 - Asymptomatic  Vitals:   03/25/21 0850  BP: (!) 144/82  Pulse: 72  Resp: 17  Temp: (!) 96.8 F (36 C)  SpO2: 100%   Filed Weights   03/25/21 0850  Weight: 202 lb 4.8 oz (91.8 kg)    GENERAL:alert, no distress and comfortable SKIN: skin color, texture, turgor are normal, no rashes or significant lesions EYES: normal, Conjunctiva are pink and non-injected, sclera clear OROPHARYNX:no exudate, no erythema and lips, buccal mucosa, and tongue normal, no oral thrush NECK: supple, thyroid normal size, non-tender, without nodularity LYMPH:  no palpable lymphadenopathy in the cervical, axillary or inguinal LUNGS: clear to auscultation and percussion with normal breathing effort HEART: regular rate & rhythm and no murmurs and no lower extremity edema ABDOMEN:abdomen soft, non-tender and normal bowel sounds Musculoskeletal:no cyanosis of digits and no clubbing  NEURO: alert & oriented x 3 with fluent speech, no focal motor/sensory deficits  LABORATORY DATA:  I have reviewed the data as listed CBC Latest Ref Rng & Units 03/25/2021 03/18/2021 01/22/2021  WBC 4.0 - 10.5 K/uL 4.1 5.6 4.4  Hemoglobin 12.0 - 15.0 g/dL 13.3 13.9 14.1  Hematocrit 36.0 - 46.0 % 40.8 41.9 42.4  Platelets 150 - 400 K/uL 207 202 230     CMP Latest Ref Rng & Units 03/18/2021 01/22/2021 08/21/2020  Glucose 70 - 99 mg/dL 81 89 -  BUN 8 - 23 mg/dL 14 17 -  Creatinine 0.44 - 1.00 mg/dL 0.81 0.83 -  Sodium 135 - 145 mmol/L 144 142 -  Potassium 3.5 - 5.1 mmol/L 3.9 4.0 -  Chloride 98 - 111 mmol/L 107 108 -  CO2 22 - 32 mmol/L 25 27 -  Calcium 8.9 - 10.3 mg/dL 9.0 9.5 -  Total Protein 6.5 - 8.1 g/dL 6.9 7.4 6.8  Total Bilirubin 0.3 - 1.2 mg/dL 0.4 0.4 -   Alkaline Phos 38 - 126 U/L 102 101 -  AST 15 - 41 U/L 20 26 -  ALT 0 - 44 U/L 23 28 -      RADIOGRAPHIC STUDIES: I have personally reviewed the radiological images as listed and agreed with the findings in the report. No results found.   ASSESSMENT & PLAN: 65 year old postmenopausal female  1.  Malignant neoplasm of upper-inner quadrant of left breast, cT1cN1M0 stage Ib, ER+/PR+/HER2+, pT2N0 stage  IA, grade 3  -Diagnosed 12/2020 s/p left lumpectomy and SLNB with Port-A-Cath placement 02/13/2021 by Dr. Barry Dienes -started adjuvant weekly Taxol and Trastuzumab on 03/18/2021  -She tolerated first cycle very well, will continue -Lab reviewed, adequate for treatment, cycle 2 chemotherapy tomorrow  2. Hot flushes  -Stable  3. peripheral neuropathy  --She has baseline neuropathy in her feet, etiology unknown -She is not diabetic, unaware of B12 deficiency but level is pending -No functional deficits -Taking B complex vitamin -I recommend cryotherapy during Taxol infusions -We will monitor closely on chemo.  We discussed the possibility of dose reducing or changing treatment if needed  4. small indeterminate lung nodules and an indeterminate left parietal bone lesion on scan  -will follow up with repeated brain MRI in 3 months and probably CT chest in 6 months    Plan  -Lab reviewed, adequate for treatment, will proceed week to Taxol and trastuzumab tomorrow and continue every week -Follow-up in 2 weeks   All questions were answered. The patient knows to call the clinic with any problems, questions or concerns. No barriers to learning was detected. I spent 20 minutes counseling the patient face to face. The total time spent in the appointment was 30 minutes and more than 50% was on counseling and review of test results     Truitt Merle, MD 03/25/21

## 2021-03-25 ENCOUNTER — Other Ambulatory Visit: Payer: BC Managed Care – PPO

## 2021-03-25 ENCOUNTER — Encounter: Payer: Self-pay | Admitting: *Deleted

## 2021-03-25 ENCOUNTER — Ambulatory Visit: Payer: BC Managed Care – PPO | Admitting: Hematology

## 2021-03-25 ENCOUNTER — Inpatient Hospital Stay: Payer: BC Managed Care – PPO

## 2021-03-25 ENCOUNTER — Other Ambulatory Visit: Payer: Self-pay

## 2021-03-25 ENCOUNTER — Inpatient Hospital Stay (HOSPITAL_BASED_OUTPATIENT_CLINIC_OR_DEPARTMENT_OTHER): Payer: BC Managed Care – PPO | Admitting: Hematology

## 2021-03-25 ENCOUNTER — Encounter: Payer: Self-pay | Admitting: Hematology

## 2021-03-25 VITALS — BP 144/82 | HR 72 | Temp 96.8°F | Resp 17 | Ht 67.0 in | Wt 202.3 lb

## 2021-03-25 DIAGNOSIS — C50912 Malignant neoplasm of unspecified site of left female breast: Secondary | ICD-10-CM

## 2021-03-25 DIAGNOSIS — Z5111 Encounter for antineoplastic chemotherapy: Secondary | ICD-10-CM | POA: Diagnosis not present

## 2021-03-25 DIAGNOSIS — Z95828 Presence of other vascular implants and grafts: Secondary | ICD-10-CM

## 2021-03-25 DIAGNOSIS — G620 Drug-induced polyneuropathy: Secondary | ICD-10-CM | POA: Diagnosis not present

## 2021-03-25 DIAGNOSIS — Z79899 Other long term (current) drug therapy: Secondary | ICD-10-CM | POA: Diagnosis not present

## 2021-03-25 DIAGNOSIS — C50212 Malignant neoplasm of upper-inner quadrant of left female breast: Secondary | ICD-10-CM

## 2021-03-25 DIAGNOSIS — Z170 Estrogen receptor positive status [ER+]: Secondary | ICD-10-CM | POA: Diagnosis not present

## 2021-03-25 DIAGNOSIS — Z5112 Encounter for antineoplastic immunotherapy: Secondary | ICD-10-CM | POA: Diagnosis not present

## 2021-03-25 LAB — CMP (CANCER CENTER ONLY)
ALT: 29 U/L (ref 0–44)
AST: 25 U/L (ref 15–41)
Albumin: 4.1 g/dL (ref 3.5–5.0)
Alkaline Phosphatase: 102 U/L (ref 38–126)
Anion gap: 11 (ref 5–15)
BUN: 17 mg/dL (ref 8–23)
CO2: 24 mmol/L (ref 22–32)
Calcium: 9.2 mg/dL (ref 8.9–10.3)
Chloride: 107 mmol/L (ref 98–111)
Creatinine: 0.79 mg/dL (ref 0.44–1.00)
GFR, Estimated: >60 mL/min (ref >60)
Glucose, Bld: 92 mg/dL (ref 70–99)
Potassium: 4.2 mmol/L (ref 3.5–5.1)
Sodium: 142 mmol/L (ref 135–145)
Total Bilirubin: 0.5 mg/dL (ref 0.3–1.2)
Total Protein: 6.8 g/dL (ref 6.5–8.1)

## 2021-03-25 LAB — CBC WITH DIFFERENTIAL (CANCER CENTER ONLY)
Abs Immature Granulocytes: 0.02 10*3/uL (ref 0.00–0.07)
Basophils Absolute: 0.1 10*3/uL (ref 0.0–0.1)
Basophils Relative: 1 %
Eosinophils Absolute: 0.1 10*3/uL (ref 0.0–0.5)
Eosinophils Relative: 3 %
HCT: 40.8 % (ref 36.0–46.0)
Hemoglobin: 13.3 g/dL (ref 12.0–15.0)
Immature Granulocytes: 1 %
Lymphocytes Relative: 40 %
Lymphs Abs: 1.6 10*3/uL (ref 0.7–4.0)
MCH: 28.5 pg (ref 26.0–34.0)
MCHC: 32.6 g/dL (ref 30.0–36.0)
MCV: 87.6 fL (ref 80.0–100.0)
Monocytes Absolute: 0.3 10*3/uL (ref 0.1–1.0)
Monocytes Relative: 7 %
Neutro Abs: 2 10*3/uL (ref 1.7–7.7)
Neutrophils Relative %: 48 %
Platelet Count: 207 10*3/uL (ref 150–400)
RBC: 4.66 MIL/uL (ref 3.87–5.11)
RDW: 12.1 % (ref 11.5–15.5)
WBC Count: 4.1 10*3/uL (ref 4.0–10.5)
nRBC: 0 % (ref 0.0–0.2)

## 2021-03-25 MED ORDER — SODIUM CHLORIDE 0.9% FLUSH
10.0000 mL | Freq: Once | INTRAVENOUS | Status: AC
  Administered 2021-03-25: 10 mL
  Filled 2021-03-25: qty 10

## 2021-03-25 MED ORDER — HEPARIN SOD (PORK) LOCK FLUSH 100 UNIT/ML IV SOLN
500.0000 [IU] | Freq: Once | INTRAVENOUS | Status: AC
  Administered 2021-03-25: 500 [IU]
  Filled 2021-03-25: qty 5

## 2021-03-25 MED FILL — Dexamethasone Sodium Phosphate Inj 100 MG/10ML: INTRAMUSCULAR | Qty: 1 | Status: AC

## 2021-03-26 ENCOUNTER — Inpatient Hospital Stay: Payer: BC Managed Care – PPO

## 2021-03-26 ENCOUNTER — Telehealth: Payer: Self-pay | Admitting: Hematology

## 2021-03-26 VITALS — BP 134/81 | HR 75 | Temp 98.0°F | Resp 18

## 2021-03-26 DIAGNOSIS — Z5112 Encounter for antineoplastic immunotherapy: Secondary | ICD-10-CM | POA: Diagnosis not present

## 2021-03-26 DIAGNOSIS — Z5111 Encounter for antineoplastic chemotherapy: Secondary | ICD-10-CM | POA: Diagnosis not present

## 2021-03-26 DIAGNOSIS — Z170 Estrogen receptor positive status [ER+]: Secondary | ICD-10-CM | POA: Diagnosis not present

## 2021-03-26 DIAGNOSIS — G620 Drug-induced polyneuropathy: Secondary | ICD-10-CM | POA: Diagnosis not present

## 2021-03-26 DIAGNOSIS — C50212 Malignant neoplasm of upper-inner quadrant of left female breast: Secondary | ICD-10-CM | POA: Diagnosis not present

## 2021-03-26 DIAGNOSIS — Z79899 Other long term (current) drug therapy: Secondary | ICD-10-CM | POA: Diagnosis not present

## 2021-03-26 MED ORDER — TRASTUZUMAB-DKST CHEMO 150 MG IV SOLR
2.0000 mg/kg | Freq: Once | INTRAVENOUS | Status: AC
  Administered 2021-03-26: 168 mg via INTRAVENOUS
  Filled 2021-03-26: qty 8

## 2021-03-26 MED ORDER — DIPHENHYDRAMINE HCL 50 MG/ML IJ SOLN
INTRAMUSCULAR | Status: AC
  Filled 2021-03-26: qty 1

## 2021-03-26 MED ORDER — SODIUM CHLORIDE 0.9 % IV SOLN
10.0000 mg | Freq: Once | INTRAVENOUS | Status: AC
  Administered 2021-03-26: 10 mg via INTRAVENOUS
  Filled 2021-03-26: qty 10

## 2021-03-26 MED ORDER — FAMOTIDINE IN NACL 20-0.9 MG/50ML-% IV SOLN
INTRAVENOUS | Status: AC
  Filled 2021-03-26: qty 50

## 2021-03-26 MED ORDER — ACETAMINOPHEN 325 MG PO TABS
ORAL_TABLET | ORAL | Status: AC
  Filled 2021-03-26: qty 2

## 2021-03-26 MED ORDER — HEPARIN SOD (PORK) LOCK FLUSH 100 UNIT/ML IV SOLN
500.0000 [IU] | Freq: Once | INTRAVENOUS | Status: AC | PRN
  Administered 2021-03-26: 500 [IU]
  Filled 2021-03-26: qty 5

## 2021-03-26 MED ORDER — DIPHENHYDRAMINE HCL 50 MG/ML IJ SOLN
25.0000 mg | Freq: Once | INTRAMUSCULAR | Status: AC
  Administered 2021-03-26: 25 mg via INTRAVENOUS

## 2021-03-26 MED ORDER — SODIUM CHLORIDE 0.9 % IV SOLN
80.0000 mg/m2 | Freq: Once | INTRAVENOUS | Status: AC
  Administered 2021-03-26: 162 mg via INTRAVENOUS
  Filled 2021-03-26: qty 27

## 2021-03-26 MED ORDER — SODIUM CHLORIDE 0.9 % IV SOLN
Freq: Once | INTRAVENOUS | Status: AC
  Filled 2021-03-26: qty 250

## 2021-03-26 MED ORDER — ACETAMINOPHEN 325 MG PO TABS
650.0000 mg | ORAL_TABLET | Freq: Once | ORAL | Status: AC
  Administered 2021-03-26: 650 mg via ORAL

## 2021-03-26 MED ORDER — SODIUM CHLORIDE 0.9% FLUSH
10.0000 mL | INTRAVENOUS | Status: DC | PRN
  Administered 2021-03-26: 10 mL
  Filled 2021-03-26: qty 10

## 2021-03-26 MED ORDER — FAMOTIDINE IN NACL 20-0.9 MG/50ML-% IV SOLN
20.0000 mg | Freq: Once | INTRAVENOUS | Status: AC
  Administered 2021-03-26: 20 mg via INTRAVENOUS

## 2021-03-26 NOTE — Telephone Encounter (Signed)
Scheduled appt per 4/12 los - pt to get an updated schedule next visit.

## 2021-03-26 NOTE — Patient Instructions (Signed)
[facility] Discharge Instructions for Patients Receiving Chemotherapy  Today you received the following chemotherapy agents Paclitaxel and Herceptin   To help prevent nausea and vomiting after your treatment, we encourage you to take your nausea medications Compazine    If you develop nausea and vomiting that is not controlled by your nausea medication, call the clinic.   BELOW ARE SYMPTOMS THAT SHOULD BE REPORTED IMMEDIATELY:  *FEVER GREATER THAN 100.5 F  *CHILLS WITH OR WITHOUT FEVER  NAUSEA AND VOMITING THAT IS NOT CONTROLLED WITH YOUR NAUSEA MEDICATION  *UNUSUAL SHORTNESS OF BREATH  *UNUSUAL BRUISING OR BLEEDING  TENDERNESS IN MOUTH AND THROAT WITH OR WITHOUT PRESENCE OF ULCERS  *URINARY PROBLEMS  *BOWEL PROBLEMS  UNUSUAL RASH Items with * indicate a potential emergency and should be followed up as soon as possible.  Feel free to call the clinic should you have any questions or concerns. The clinic phone number is [phone].  Please show the [facility] at check-in to the Emergency Department and triage nurse.

## 2021-03-28 ENCOUNTER — Other Ambulatory Visit: Payer: Self-pay | Admitting: Hematology

## 2021-03-28 ENCOUNTER — Telehealth: Payer: Self-pay | Admitting: *Deleted

## 2021-03-28 NOTE — Telephone Encounter (Signed)
Aimee Mcclain states she had chemo on Wednesday. Thursday after work "my face was blood-red and hot. Used cool compresses that relieved briefly" Later in the evening she pulled trash cans up driveway, heat came back to face and she felt heaviness in chest- not difficulty breathing, but extremely heavy feeling. This morning she is slightly flushed and only slight heaviness in chest.   She is concerned about her next chemo. States she has slight flushing the day after her first day of chemo. This was her second chemo.

## 2021-03-28 NOTE — Telephone Encounter (Signed)
Notified of message below

## 2021-03-28 NOTE — Telephone Encounter (Signed)
This could be delayed reaction to Taxol, please let her take benadryl or claritin as needed. I will change Taxol to Abraxane if her insurance approves. Seth Bake, please submit PA asap. Thanks   Truitt Merle MD

## 2021-04-01 MED FILL — Dexamethasone Sodium Phosphate Inj 100 MG/10ML: INTRAMUSCULAR | Qty: 1 | Status: AC

## 2021-04-02 ENCOUNTER — Other Ambulatory Visit: Payer: Self-pay

## 2021-04-02 ENCOUNTER — Inpatient Hospital Stay: Payer: BC Managed Care – PPO

## 2021-04-02 ENCOUNTER — Other Ambulatory Visit: Payer: Self-pay | Admitting: Hematology

## 2021-04-02 VITALS — BP 145/87 | HR 74 | Temp 98.6°F | Resp 18 | Wt 205.0 lb

## 2021-04-02 DIAGNOSIS — Z5111 Encounter for antineoplastic chemotherapy: Secondary | ICD-10-CM | POA: Diagnosis not present

## 2021-04-02 DIAGNOSIS — C50212 Malignant neoplasm of upper-inner quadrant of left female breast: Secondary | ICD-10-CM | POA: Diagnosis not present

## 2021-04-02 DIAGNOSIS — Z79899 Other long term (current) drug therapy: Secondary | ICD-10-CM | POA: Diagnosis not present

## 2021-04-02 DIAGNOSIS — Z170 Estrogen receptor positive status [ER+]: Secondary | ICD-10-CM | POA: Diagnosis not present

## 2021-04-02 DIAGNOSIS — Z5112 Encounter for antineoplastic immunotherapy: Secondary | ICD-10-CM | POA: Diagnosis not present

## 2021-04-02 DIAGNOSIS — G620 Drug-induced polyneuropathy: Secondary | ICD-10-CM | POA: Diagnosis not present

## 2021-04-02 DIAGNOSIS — Z95828 Presence of other vascular implants and grafts: Secondary | ICD-10-CM

## 2021-04-02 DIAGNOSIS — C50912 Malignant neoplasm of unspecified site of left female breast: Secondary | ICD-10-CM

## 2021-04-02 LAB — CBC WITH DIFFERENTIAL (CANCER CENTER ONLY)
Abs Immature Granulocytes: 0.02 10*3/uL (ref 0.00–0.07)
Basophils Absolute: 0 10*3/uL (ref 0.0–0.1)
Basophils Relative: 1 %
Eosinophils Absolute: 0.1 10*3/uL (ref 0.0–0.5)
Eosinophils Relative: 2 %
HCT: 37.6 % (ref 36.0–46.0)
Hemoglobin: 12.5 g/dL (ref 12.0–15.0)
Immature Granulocytes: 1 %
Lymphocytes Relative: 45 %
Lymphs Abs: 1.8 10*3/uL (ref 0.7–4.0)
MCH: 29.2 pg (ref 26.0–34.0)
MCHC: 33.2 g/dL (ref 30.0–36.0)
MCV: 87.9 fL (ref 80.0–100.0)
Monocytes Absolute: 0.3 10*3/uL (ref 0.1–1.0)
Monocytes Relative: 7 %
Neutro Abs: 1.7 10*3/uL (ref 1.7–7.7)
Neutrophils Relative %: 44 %
Platelet Count: 244 10*3/uL (ref 150–400)
RBC: 4.28 MIL/uL (ref 3.87–5.11)
RDW: 12.7 % (ref 11.5–15.5)
WBC Count: 3.9 10*3/uL — ABNORMAL LOW (ref 4.0–10.5)
nRBC: 0 % (ref 0.0–0.2)

## 2021-04-02 LAB — CMP (CANCER CENTER ONLY)
ALT: 34 U/L (ref 0–44)
AST: 26 U/L (ref 15–41)
Albumin: 4 g/dL (ref 3.5–5.0)
Alkaline Phosphatase: 97 U/L (ref 38–126)
Anion gap: 9 (ref 5–15)
BUN: 13 mg/dL (ref 8–23)
CO2: 25 mmol/L (ref 22–32)
Calcium: 9 mg/dL (ref 8.9–10.3)
Chloride: 108 mmol/L (ref 98–111)
Creatinine: 0.79 mg/dL (ref 0.44–1.00)
GFR, Estimated: >60 mL/min (ref >60)
Glucose, Bld: 104 mg/dL — ABNORMAL HIGH (ref 70–99)
Potassium: 3.9 mmol/L (ref 3.5–5.1)
Sodium: 142 mmol/L (ref 135–145)
Total Bilirubin: 0.3 mg/dL (ref 0.3–1.2)
Total Protein: 6.5 g/dL (ref 6.5–8.1)

## 2021-04-02 MED ORDER — ACETAMINOPHEN 325 MG PO TABS
650.0000 mg | ORAL_TABLET | Freq: Once | ORAL | Status: AC
  Administered 2021-04-02: 650 mg via ORAL

## 2021-04-02 MED ORDER — HEPARIN SOD (PORK) LOCK FLUSH 100 UNIT/ML IV SOLN
500.0000 [IU] | Freq: Once | INTRAVENOUS | Status: AC | PRN
  Administered 2021-04-02: 500 [IU]
  Filled 2021-04-02: qty 5

## 2021-04-02 MED ORDER — PACLITAXEL PROTEIN-BOUND CHEMO INJECTION 100 MG
100.0000 mg/m2 | Freq: Once | INTRAVENOUS | Status: AC
  Administered 2021-04-02: 200 mg via INTRAVENOUS
  Filled 2021-04-02: qty 40

## 2021-04-02 MED ORDER — SODIUM CHLORIDE 0.9 % IV SOLN
Freq: Once | INTRAVENOUS | Status: AC
Start: 2021-04-02 — End: 2021-04-02
  Filled 2021-04-02: qty 250

## 2021-04-02 MED ORDER — ACETAMINOPHEN 325 MG PO TABS
ORAL_TABLET | ORAL | Status: AC
  Filled 2021-04-02: qty 2

## 2021-04-02 MED ORDER — SODIUM CHLORIDE 0.9% FLUSH
10.0000 mL | INTRAVENOUS | Status: DC | PRN
Start: 2021-04-02 — End: 2021-04-02
  Administered 2021-04-02: 10 mL
  Filled 2021-04-02: qty 10

## 2021-04-02 MED ORDER — TRASTUZUMAB-DKST CHEMO 150 MG IV SOLR
189.0000 mg | Freq: Once | INTRAVENOUS | Status: AC
  Administered 2021-04-02: 189 mg via INTRAVENOUS
  Filled 2021-04-02: qty 9

## 2021-04-02 MED ORDER — FAMOTIDINE IN NACL 20-0.9 MG/50ML-% IV SOLN
INTRAVENOUS | Status: AC
  Filled 2021-04-02: qty 50

## 2021-04-02 MED ORDER — ONDANSETRON HCL 8 MG PO TABS
8.0000 mg | ORAL_TABLET | Freq: Once | ORAL | Status: AC
  Administered 2021-04-02: 8 mg via ORAL

## 2021-04-02 MED ORDER — DIPHENHYDRAMINE HCL 50 MG/ML IJ SOLN
INTRAMUSCULAR | Status: AC
  Filled 2021-04-02: qty 1

## 2021-04-02 MED ORDER — DIPHENHYDRAMINE HCL 25 MG PO CAPS
ORAL_CAPSULE | ORAL | Status: AC
  Filled 2021-04-02: qty 1

## 2021-04-02 MED ORDER — HEPARIN SOD (PORK) LOCK FLUSH 100 UNIT/ML IV SOLN
500.0000 [IU] | Freq: Once | INTRAVENOUS | Status: DC
  Filled 2021-04-02: qty 5

## 2021-04-02 MED ORDER — DEXAMETHASONE 4 MG PO TABS
ORAL_TABLET | ORAL | Status: AC
  Filled 2021-04-02: qty 2

## 2021-04-02 MED ORDER — ONDANSETRON HCL 8 MG PO TABS
ORAL_TABLET | ORAL | Status: AC
  Filled 2021-04-02: qty 1

## 2021-04-02 MED ORDER — SODIUM CHLORIDE 0.9% FLUSH
10.0000 mL | Freq: Once | INTRAVENOUS | Status: AC
  Administered 2021-04-02: 10 mL
  Filled 2021-04-02: qty 10

## 2021-04-02 MED ORDER — DIPHENHYDRAMINE HCL 50 MG/ML IJ SOLN
25.0000 mg | Freq: Once | INTRAMUSCULAR | Status: AC
  Administered 2021-04-02: 25 mg via INTRAVENOUS

## 2021-04-02 NOTE — Progress Notes (Signed)
Increase Herceptin dose due to weight changes per MD.  Acquanetta Belling, RPH, BCPS, BCOP 04/02/2021 2:34 PM

## 2021-04-02 NOTE — Patient Instructions (Addendum)
[facility] Discharge Instructions for Patients Receiving Chemotherapy  Today you received the following chemotherapy agents Abraxane and trastuzumab  To help prevent nausea and vomiting after your treatment, we encourage you to take your nausea medication as directed   If you develop nausea and vomiting that is not controlled by your nausea medication, call the clinic.   BELOW ARE SYMPTOMS THAT SHOULD BE REPORTED IMMEDIATELY:  *FEVER GREATER THAN 100.5 F  *CHILLS WITH OR WITHOUT FEVER  NAUSEA AND VOMITING THAT IS NOT CONTROLLED WITH YOUR NAUSEA MEDICATION  *UNUSUAL SHORTNESS OF BREATH  *UNUSUAL BRUISING OR BLEEDING  TENDERNESS IN MOUTH AND THROAT WITH OR WITHOUT PRESENCE OF ULCERS  *URINARY PROBLEMS  *BOWEL PROBLEMS  UNUSUAL RASH Items with * indicate a potential emergency and should be followed up as soon as possible.  Feel free to call the clinic should you have any questions or concerns. The clinic phone number is [phone].  Please show the [facility] at check-in to the Emergency Department and triage nurse.  Nanoparticle Albumin-Bound Paclitaxel injection What is this medicine? NANOPARTICLE ALBUMIN-BOUND PACLITAXEL (Na no PAHR ti kuhl al BYOO muhn-bound PAK li TAX el) is a chemotherapy drug. It targets fast dividing cells, like cancer cells, and causes these cells to die. This medicine is used to treat advanced breast cancer, lung cancer, and pancreatic cancer. This medicine may be used for other purposes; ask your health care provider or pharmacist if you have questions. COMMON BRAND NAME(S): Abraxane What should I tell my health care provider before I take this medicine? They need to know if you have any of these conditions:  kidney disease  liver disease  low blood counts, like low white cell, platelet, or red cell counts  lung or breathing disease, like asthma  tingling of the fingers or toes, or other nerve disorder  an unusual  or allergic reaction to paclitaxel, albumin, other chemotherapy, other medicines, foods, dyes, or preservatives  pregnant or trying to get pregnant  breast-feeding How should I use this medicine? This drug is given as an infusion into a vein. It is administered in a hospital or clinic by a specially trained health care professional. Talk to your pediatrician regarding the use of this medicine in children. Special care may be needed. Overdosage: If you think you have taken too much of this medicine contact a poison control center or emergency room at once. NOTE: This medicine is only for you. Do not share this medicine with others. What if I miss a dose? It is important not to miss your dose. Call your doctor or health care professional if you are unable to keep an appointment. What may interact with this medicine? This medicine may interact with the following medications:  antiviral medicines for hepatitis, HIV or AIDS  certain antibiotics like erythromycin and clarithromycin  certain medicines for fungal infections like ketoconazole and itraconazole  certain medicines for seizures like carbamazepine, phenobarbital, phenytoin  gemfibrozil  nefazodone  rifampin  St. John's wort This list may not describe all possible interactions. Give your health care provider a list of all the medicines, herbs, non-prescription drugs, or dietary supplements you use. Also tell them if you smoke, drink alcohol, or use illegal drugs. Some items may interact with your medicine. What should I watch for while using this medicine? Your condition will be monitored carefully while you are receiving this medicine. You will need important blood work done while you are taking this medicine. This medicine can cause  serious allergic reactions. If you experience allergic reactions like skin rash, itching or hives, swelling of the face, lips, or tongue, tell your doctor or health care professional right away. In  some cases, you may be given additional medicines to help with side effects. Follow all directions for their use. This drug may make you feel generally unwell. This is not uncommon, as chemotherapy can affect healthy cells as well as cancer cells. Report any side effects. Continue your course of treatment even though you feel ill unless your doctor tells you to stop. Call your doctor or health care professional for advice if you get a fever, chills or sore throat, or other symptoms of a cold or flu. Do not treat yourself. This drug decreases your body's ability to fight infections. Try to avoid being around people who are sick. This medicine may increase your risk to bruise or bleed. Call your doctor or health care professional if you notice any unusual bleeding. Be careful brushing and flossing your teeth or using a toothpick because you may get an infection or bleed more easily. If you have any dental work done, tell your dentist you are receiving this medicine. Avoid taking products that contain aspirin, acetaminophen, ibuprofen, naproxen, or ketoprofen unless instructed by your doctor. These medicines may hide a fever. Do not become pregnant while taking this medicine or for 6 months after stopping it. Women should inform their doctor if they wish to become pregnant or think they might be pregnant. Men should not father a child while taking this medicine or for 3 months after stopping it. There is a potential for serious side effects to an unborn child. Talk to your health care professional or pharmacist for more information. Do not breast-feed an infant while taking this medicine or for 2 weeks after stopping it. This medicine may interfere with the ability to get pregnant or to father a child. You should talk to your doctor or health care professional if you are concerned about your fertility. What side effects may I notice from receiving this medicine? Side effects that you should report to your  doctor or health care professional as soon as possible:  allergic reactions like skin rash, itching or hives, swelling of the face, lips, or tongue  breathing problems  changes in vision  fast, irregular heartbeat  low blood pressure  mouth sores  pain, tingling, numbness in the hands or feet  signs of decreased platelets or bleeding - bruising, pinpoint red spots on the skin, black, tarry stools, blood in the urine  signs of decreased red blood cells - unusually weak or tired, feeling faint or lightheaded, falls  signs of infection - fever or chills, cough, sore throat, pain or difficulty passing urine  signs and symptoms of liver injury like dark yellow or brown urine; general ill feeling or flu-like symptoms; light-colored stools; loss of appetite; nausea; right upper belly pain; unusually weak or tired; yellowing of the eyes or skin  swelling of the ankles, feet, hands  unusually slow heartbeat Side effects that usually do not require medical attention (report to your doctor or health care professional if they continue or are bothersome):  diarrhea  hair loss  loss of appetite  nausea, vomiting  tiredness This list may not describe all possible side effects. Call your doctor for medical advice about side effects. You may report side effects to FDA at 1-800-FDA-1088. Where should I keep my medicine? This drug is given in a hospital or clinic and  will not be stored at home. NOTE: This sheet is a summary. It may not cover all possible information. If you have questions about this medicine, talk to your doctor, pharmacist, or health care provider.  2021 Elsevier/Gold Standard (2017-08-03 13:03:45)

## 2021-04-03 ENCOUNTER — Telehealth: Payer: Self-pay | Admitting: *Deleted

## 2021-04-08 NOTE — Progress Notes (Signed)
[place]   [phone] [phone]   Clinic Follow up Note   Patient Care Team: [name], MD as PCP - General (Family Medicine) Rockwell Germany, RN as Oncology Nurse Navigator Mauro Kaufmann, RN as Oncology Nurse Navigator Stark Klein, MD as Consulting Physician (General Surgery) Truitt Merle, MD as Consulting Physician (Hematology) Eppie Gibson, MD as Attending Physician (Radiation Oncology) 04/09/2021  CHIEF COMPLAINT: Follow-up left breast cancer  SUMMARY OF ONCOLOGIC HISTORY: Oncology History Overview Note  Cancer Staging Malignant neoplasm of upper-inner quadrant of left breast in female, estrogen receptor positive ([place]) Staging form: Breast, AJCC 8th Edition - Clinical stage from 01/14/2020: Stage IB (cT1c, cN1, cM0, G3, ER+, PR+, HER2+) - Signed by Truitt Merle, MD on 01/21/2021 Stage prefix: Initial diagnosis    Malignant neoplasm of upper-inner quadrant of left breast in female, estrogen receptor positive ([place])  01/14/2020 Cancer Staging   Staging form: Breast, AJCC 8th Edition - Clinical stage from 01/14/2020: Stage IB (cT1c, cN1, cM0, G3, ER+, PR+, HER2+) - Signed by Truitt Merle, MD on 01/21/2021 Stage prefix: Initial diagnosis   01/02/2021 Mammogram   Left breast with 1.9x1x1.8 cm complex cyst at 12:00 position, 4cmfn, versus saloid mass with calcifications, irregulr, increased in size. Biopsy recommended.    01/13/2021 Initial Biopsy   Diagnosis 1. Breast, left, needle core biopsy, 11 o'clock, 4cmfn - INVASIVE DUCTAL CARCINOMA - SEE COMMENT 2. Lymph node, needle/core biopsy, left axilla - FIBROVASCULAR AND ADIPOSE TISSUE - NO LYMPHOID TISSUE OR CARCINOMA IDENTIFIED Microscopic Comment 1. Based on the biopsy, the carcinoma appears Nottingham grade 3 of 3 and measures 1.2 cm in greatest linear extent. Prognostic markers (ER/PR/ki-67/HER2) are pending and will be reported in an addendum. Dr. Jeannie Done reviewed the case and agrees  with the above diagnosis. These results were called to [facility] on January 14, 2021   01/13/2021 Receptors her2   1. PROGNOSTIC INDICATORS Results: IMMUNOHISTOCHEMICAL AND MORPHOMETRIC ANALYSIS PERFORMED MANUALLY The tumor cells are POSITIVE for Her2 (3+). Estrogen Receptor: 95%, POSITIVE, STRONG STAINING INTENSITY Progesterone Receptor: 85%, POSITIVE, STRONG STAINING INTENSITY Proliferation Marker Ki67: 30%   01/16/2021 Initial Diagnosis   Malignant neoplasm of upper-inner quadrant of left breast in female, estrogen receptor positive ([place])   01/23/2021 Imaging   MRI Breast  IMPRESSION: 1. Known malignancy measuring 2.9 centimeters in the UPPER INNER QUADRANT of the LEFT breast. There is no evidence for involvement of the pectoralis muscle or implant. 2. No evidence for lymphadenopathy. 3. RIGHT breast is negative. 4. Bilateral retropectoral saline implants.   02/09/2021 Imaging   MRI Brain IMPRESSION: No evidence of intracranial metastatic disease.   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     02/13/2021 Surgery   LEFT BREAST LUMPECTOMY WITH RADIOACTIVE SEED AND SENTINEL LYMPH NODE BIOPSY WITH SEED TARGETED NODE and PAC Placed by Dr Barry Dienes   02/13/2021 Pathology Results   FINAL MICROSCOPIC DIAGNOSIS:   A. BREAST, LEFT, LUMPECTOMY:  - Invasive ductal carcinoma, 2.6 cm, Nottingham grade 3 of 3.  - Ductal carcinoma in situ, high grade with central necrosis.  - Margins of resection:       - Invasive carcinoma broadly involves the anterior and inferior  margins.  Invasive carcinoma is focally < 1 mm from each the superior  and medial margins.       - DCIS is focally < 1 mm from the inferior margin.  DCIS is focally  1 mm from each the anterior and superior margins.  DCIS is  focally 1-2  mm from the medial margin.  - Biopsy clip.  - See oncology table.   B. BREAST, LEFT, ANTERIOR TISSUE, EXCISION:  - Residual invasive and in-situ ductal carcinoma.  - Residual  invasive ductal carcinoma broadly involves the posterior  margin.  - Residual DCIS is focally 1 mm from the posterior margin.   C. BREAST, LEFT, INFERIOR MARGIN, EXCISION:  - No carcinoma identified.   D. SENTINEL LYMPH NODE, LEFT AXILLARY #1, BIOPSY:  - No carcinoma identified in one lymph node (0/1).   E. SENTINEL LYMPH NODE, LEFT AXILLARY #2, BIOPSY:  - Biopsy site.  - No distinct nodal tissue identified.  - No carcinoma identified.   F. SENTINEL LYMPH NODE, LEFT AXILLARY #3, BIOPSY:  - No carcinoma identified in one lymph node (0/1).   G. SENTINEL LYMPH NODE, LEFT AXILLARY #4, BIOPSY:  - No carcinoma identified in one lymph node (0/1).    02/13/2021 Cancer Staging   Staging form: Breast, AJCC 8th Edition - Pathologic stage from 02/13/2021: Stage IA (pT2, pN0, cM0, G3, ER+, PR+, HER2+) - Signed by Heilingoetter, Cassandra L, PA-C on 03/07/2021 Stage prefix: Initial diagnosis Nuclear grade: G3 Histologic grading system: 3 grade system   03/18/2021 -  Chemotherapy    Patient is on Treatment Plan: BREAST PACLITAXEL + TRASTUZUMAB Q7D / TRASTUZUMAB Q21D        CURRENT THERAPY: Adjuvant chemo weekly taxol and herceptin q12 weeks followed by maintenance herceptin q3 weeks to complete 1 year, starting 03/18/2021  INTERVAL HISTORY: Ms. Hargraves returns for follow-up and treatment as scheduled.  She completed 2 cycles of weekly Taxol/Herceptin.  The day after cycle 2 she developed flushing and chest heaviness at home which lasted for about a day then resolved.  She was changed to Abraxane with week 3 due to the potential reaction to Taxol.  She had no recurrent episodes. She has stable joint pains on chemo. Stools are loose with 2-4 BMs per day.  Denies nausea/vomiting.  Able to eat and drink and remain active, denies mucositis.  Baseline neuropathy in her toes is stable for the most part.  She does cryotherapy. She continues to have mild bleeding from hemorrhoids and sinuses since chemo.     Denies fever, chills, cough, chest pain, dyspnea, or concerns at the left breast surgical site.   MEDICAL HISTORY:  Past Medical History:  Diagnosis Date  . Breast cancer ([place])   . Cataract   . Colon polyps   . Endometriosis   . Family history of colon cancer   . Family history of colonic polyps   . GERD (gastroesophageal reflux disease)   . H/O bilateral breast implants   . History of colon polyps   . HPV in female   . HSV (herpes simplex virus) infection   . Migraine   . S/P TAH (total abdominal hysterectomy) 1991    SURGICAL HISTORY: Past Surgical History:  Procedure Laterality Date  . ABDOMINAL HYSTERECTOMY    . BREAST BIOPSY     Benign.  Marland Kitchen BREAST ENHANCEMENT SURGERY    . BREAST LUMPECTOMY Bilateral 1993 and 1995  . BREAST LUMPECTOMY WITH RADIOACTIVE SEED AND SENTINEL LYMPH NODE BIOPSY Left 02/13/2021   Procedure: LEFT BREAST LUMPECTOMY WITH RADIOACTIVE SEED AND SENTINEL LYMPH NODE BIOPSY WITH SEED TARGETED NODE;  Surgeon: Stark Klein, MD;  Location: [facility];  Service: General;  Laterality: Left;  . CESAREAN SECTION    . PARTIAL HYSTERECTOMY    . PORTACATH PLACEMENT Right 02/13/2021  Procedure: INSERTION PORT-A-CATH;  Surgeon: Stark Klein, MD;  Location: [facility];  Service: General;  Laterality: Right;  . TONSILLECTOMY    . WISDOM TOOTH EXTRACTION      I have reviewed the social history and family history with the patient and they are unchanged from previous note.  ALLERGIES:  is allergic to ibuprofen, omnicef [cefdinir], proton pump inhibitors, roxicodone [oxycodone hcl], and vioxx [rofecoxib].  MEDICATIONS:  Current Outpatient Medications  Medication Sig Dispense Refill  . acetaminophen (TYLENOL) 500 MG tablet Take 1,000 mg by mouth every 6 (six) hours as needed.    . Calcium 600-200 MG-UNIT tablet Take 1 tablet by mouth daily.    . calcium carbonate (TUMS - DOSED IN MG ELEMENTAL CALCIUM) 500 MG chewable tablet Chew 1 tablet by  mouth as needed for indigestion or heartburn. Takes 5-6 per day    . lidocaine-prilocaine (EMLA) cream Apply 1 application topically as needed. 30 g 2  . Multiple Vitamin (MULTIVITAMIN) tablet Take 1 tablet by mouth daily.    . ondansetron (ZOFRAN) 8 MG tablet Take 1 tablet (8 mg total) by mouth every 8 (eight) hours as needed for nausea or vomiting. 30 tablet 2  . prochlorperazine (COMPAZINE) 10 MG tablet Take 1 tablet (10 mg total) by mouth every 6 (six) hours as needed. 30 tablet 2  . valACYclovir (VALTREX) 500 MG tablet Take 1 tablet (500 mg total) by mouth 2 (two) times daily as needed.    . Vitamin B Complex-C CAPS Take 1 capsule by mouth.     No current facility-administered medications for this visit.   Facility-Administered Medications Ordered in Other Visits  Medication Dose Route Frequency Provider Last Rate Last Admin  . heparin lock flush 100 unit/mL  500 Units Intracatheter Once PRN Truitt Merle, MD      . PACLitaxel-protein bound (ABRAXANE) chemo infusion 200 mg  100 mg/m2 (Treatment Plan Recorded) Intravenous Once Truitt Merle, MD      . sodium chloride flush (NS) 0.9 % injection 10 mL  10 mL Intracatheter PRN Truitt Merle, MD      . trastuzumab-dkst (OGIVRI) 189 mg in sodium chloride 0.9 % 250 mL chemo infusion  2 mg/kg (Order-Specific) Intravenous Once Truitt Merle, MD        PHYSICAL EXAMINATION: ECOG PERFORMANCE STATUS: 1 - Symptomatic but completely ambulatory  Vitals:   04/09/21 1006  BP: (!) 142/92  Pulse: 69  Resp: 17  Temp: 97.8 F (36.6 C)  SpO2: 99%   Filed Weights   04/09/21 1006  Weight: 203 lb 8 oz (92.3 kg)    GENERAL:alert, no distress and comfortable SKIN: no rash, erythema, or warmth EYES: sclera clear LUNGS:  normal breathing effort HEART:  no lower extremity edema NEURO: alert & oriented x 3 with fluent speech, no focal motor/sensory deficits Breast exam: S/p left lumpectomy, incisions completely healed.  No palpable mass, seroma, or nodularity in the  left breast or axilla that I could appreciate   LABORATORY DATA:  I have reviewed the data as listed CBC Latest Ref Rng & Units 04/09/2021 04/02/2021 03/25/2021  WBC 4.0 - 10.5 K/uL 3.6(L) 3.9(L) 4.1  Hemoglobin 12.0 - 15.0 g/dL 12.6 12.5 13.3  Hematocrit 36.0 - 46.0 % 37.5 37.6 40.8  Platelets 150 - 400 K/uL 250 244 207     CMP Latest Ref Rng & Units 04/09/2021 04/02/2021 03/25/2021  Glucose 70 - 99 mg/dL 94 104(H) 92  BUN 8 - 23 mg/dL 14 13 17   Creatinine  0.44 - 1.00 mg/dL 0.78 0.79 0.79  Sodium 135 - 145 mmol/L 141 142 142  Potassium 3.5 - 5.1 mmol/L 4.5 3.9 4.2  Chloride 98 - 111 mmol/L 108 108 107  CO2 22 - 32 mmol/L 26 25 24   Calcium 8.9 - 10.3 mg/dL 9.3 9.0 9.2  Total Protein 6.5 - 8.1 g/dL 6.8 6.5 6.8  Total Bilirubin 0.3 - 1.2 mg/dL 0.4 0.3 0.5  Alkaline Phos 38 - 126 U/L 93 97 102  AST 15 - 41 U/L 34 26 25  ALT 0 - 44 U/L 57(H) 34 29      RADIOGRAPHIC STUDIES: I have personally reviewed the radiological images as listed and agreed with the findings in the report. No results found.   ASSESSMENT & PLAN: 65 year old postmenopausal female  1.  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 Barry Dienes -Adjuvant therapy with weekly Taxol and Herceptin followed by maintenance Herceptin every 3 weeks to complete 1 year was recommended to reduce her risk of recurrence. Given her tumor < 3 cm and negative lymph node, Dr. Burr Medico did not recommend more intensive chemo. -Staging work-up showed small indeterminate lung nodules and an indeterminate left parietal bone lesion which will monitor with future scans, otherwise negative for distant metastasis -Baseline echo showed EF 60-65 % with GLS -22.4%, will monitor every 3 months on Herceptin -she began weekly taxol/herceptin 03/18/21, developed possible taxol reaction after C2 with  flushing and chest heaviness at home, resolved. No intervention needed. Switched to abraxane with C3, tolerating well  -Her treatment plan includes adjuvant radiation after chemotherapy, and antiestrogen to follow  2.  Peripheral neuropathy -She has baseline neuropathy in her feet, etiology unknown -She is not diabetic, unaware of B12 deficiency but level is pending -No functional deficits -Taking B complex vitamin -She adheres to cryotherapy during chemo infusions -We will monitor closely on chemo. Overall stable after 2 cycles of taxol and cycle 3 with change to abraxane   3.  Vasomotor symptoms of menopause: Hot flashes -She was previously on HRT which she stopped with cancer diagnosis in 12/2020 -Hot flashes are mild and tolerable at this time -We discussed SSRI versus gabapentin, she declined for now but may be interested if she has increased hot flashes on antiestrogen therapy -May consider starting gabapentin sooner if she has progressive neuropathy on chemo  Disposition: Mr. Idelle Crouch appears stable.  She completed 2 cycles of adjuvant weekly Taxol and Herceptin, and changed to Abraxane with week 3 due to possible Taxol reaction.  She tolerated Abraxane well, stable joint aches, neuropathy, and loose BM.  We reviewed symptom management.  Side effects are well managed with supportive care at home.  She is able to recover and function well.  Labs reviewed, ANC 1.5, ALT 57 likely from Abraxane.  Labs adequate to proceed with week for Abraxane/Herceptin today as planned.  We reviewed infection precautions, and the likely event she may need Granix support on subsequent cycles.  We reviewed potential risk/benefit and side effects, she agrees to proceed if needed.  We will go ahead and get insurance approval.  Briefly discussed her treatment course including the duration of chemo, the continuation of Herceptin, anticipating adjuvant radiation and antiestrogen therapy in the future.  Her  questions were answered, she is agreeable with the plan.  She will return next week for lab and C5 weekly Abraxane/Herceptin,  and follow-up in 2 weeks with cycle 6.  The plan was reviewed with Dr. Burr Medico.   All questions were answered. The patient knows to call the clinic with any problems, questions or concerns. No barriers to learning were detected. Total encounter time was 30 minutes.      Alla Feeling, NP 04/09/21

## 2021-04-09 ENCOUNTER — Inpatient Hospital Stay: Payer: BC Managed Care – PPO

## 2021-04-09 ENCOUNTER — Encounter: Payer: Self-pay | Admitting: Nurse Practitioner

## 2021-04-09 ENCOUNTER — Inpatient Hospital Stay (HOSPITAL_BASED_OUTPATIENT_CLINIC_OR_DEPARTMENT_OTHER): Payer: BC Managed Care – PPO | Admitting: Nurse Practitioner

## 2021-04-09 ENCOUNTER — Other Ambulatory Visit: Payer: Self-pay

## 2021-04-09 VITALS — BP 142/92 | HR 69 | Temp 97.8°F | Resp 17 | Ht 67.0 in | Wt 203.5 lb

## 2021-04-09 DIAGNOSIS — Z95828 Presence of other vascular implants and grafts: Secondary | ICD-10-CM

## 2021-04-09 DIAGNOSIS — Z170 Estrogen receptor positive status [ER+]: Secondary | ICD-10-CM | POA: Diagnosis not present

## 2021-04-09 DIAGNOSIS — C50212 Malignant neoplasm of upper-inner quadrant of left female breast: Secondary | ICD-10-CM

## 2021-04-09 DIAGNOSIS — C50912 Malignant neoplasm of unspecified site of left female breast: Secondary | ICD-10-CM

## 2021-04-09 DIAGNOSIS — Z5112 Encounter for antineoplastic immunotherapy: Secondary | ICD-10-CM | POA: Diagnosis not present

## 2021-04-09 DIAGNOSIS — G620 Drug-induced polyneuropathy: Secondary | ICD-10-CM | POA: Diagnosis not present

## 2021-04-09 DIAGNOSIS — Z79899 Other long term (current) drug therapy: Secondary | ICD-10-CM | POA: Diagnosis not present

## 2021-04-09 DIAGNOSIS — Z5111 Encounter for antineoplastic chemotherapy: Secondary | ICD-10-CM | POA: Diagnosis not present

## 2021-04-09 LAB — CMP (CANCER CENTER ONLY)
ALT: 57 U/L — ABNORMAL HIGH (ref 0–44)
AST: 34 U/L (ref 15–41)
Albumin: 4.1 g/dL (ref 3.5–5.0)
Alkaline Phosphatase: 93 U/L (ref 38–126)
Anion gap: 7 (ref 5–15)
BUN: 14 mg/dL (ref 8–23)
CO2: 26 mmol/L (ref 22–32)
Calcium: 9.3 mg/dL (ref 8.9–10.3)
Chloride: 108 mmol/L (ref 98–111)
Creatinine: 0.78 mg/dL (ref 0.44–1.00)
GFR, Estimated: >60 mL/min (ref >60)
Glucose, Bld: 94 mg/dL (ref 70–99)
Potassium: 4.5 mmol/L (ref 3.5–5.1)
Sodium: 141 mmol/L (ref 135–145)
Total Bilirubin: 0.4 mg/dL (ref 0.3–1.2)
Total Protein: 6.8 g/dL (ref 6.5–8.1)

## 2021-04-09 LAB — CBC WITH DIFFERENTIAL (CANCER CENTER ONLY)
Abs Immature Granulocytes: 0.02 10*3/uL (ref 0.00–0.07)
Basophils Absolute: 0.1 10*3/uL (ref 0.0–0.1)
Basophils Relative: 1 %
Eosinophils Absolute: 0 10*3/uL (ref 0.0–0.5)
Eosinophils Relative: 1 %
HCT: 37.5 % (ref 36.0–46.0)
Hemoglobin: 12.6 g/dL (ref 12.0–15.0)
Immature Granulocytes: 1 %
Lymphocytes Relative: 47 %
Lymphs Abs: 1.7 10*3/uL (ref 0.7–4.0)
MCH: 29.6 pg (ref 26.0–34.0)
MCHC: 33.6 g/dL (ref 30.0–36.0)
MCV: 88 fL (ref 80.0–100.0)
Monocytes Absolute: 0.3 10*3/uL (ref 0.1–1.0)
Monocytes Relative: 8 %
Neutro Abs: 1.5 10*3/uL — ABNORMAL LOW (ref 1.7–7.7)
Neutrophils Relative %: 42 %
Platelet Count: 250 10*3/uL (ref 150–400)
RBC: 4.26 MIL/uL (ref 3.87–5.11)
RDW: 12.7 % (ref 11.5–15.5)
WBC Count: 3.6 10*3/uL — ABNORMAL LOW (ref 4.0–10.5)
nRBC: 0 % (ref 0.0–0.2)

## 2021-04-09 MED ORDER — ACETAMINOPHEN 325 MG PO TABS
650.0000 mg | ORAL_TABLET | Freq: Once | ORAL | Status: AC
Start: 2021-04-09 — End: 2021-04-09
  Administered 2021-04-09: 650 mg via ORAL

## 2021-04-09 MED ORDER — SODIUM CHLORIDE 0.9% FLUSH
10.0000 mL | Freq: Once | INTRAVENOUS | Status: AC
  Administered 2021-04-09: 10 mL
  Filled 2021-04-09: qty 10

## 2021-04-09 MED ORDER — HEPARIN SOD (PORK) LOCK FLUSH 100 UNIT/ML IV SOLN
500.0000 [IU] | Freq: Once | INTRAVENOUS | Status: AC | PRN
  Administered 2021-04-09: 500 [IU]
  Filled 2021-04-09: qty 5

## 2021-04-09 MED ORDER — TRASTUZUMAB-DKST CHEMO 150 MG IV SOLR
2.0000 mg/kg | Freq: Once | INTRAVENOUS | Status: AC
  Administered 2021-04-09: 189 mg via INTRAVENOUS
  Filled 2021-04-09: qty 9

## 2021-04-09 MED ORDER — PACLITAXEL PROTEIN-BOUND CHEMO INJECTION 100 MG
100.0000 mg/m2 | Freq: Once | INTRAVENOUS | Status: AC
  Administered 2021-04-09: 200 mg via INTRAVENOUS
  Filled 2021-04-09: qty 40

## 2021-04-09 MED ORDER — SODIUM CHLORIDE 0.9 % IV SOLN
Freq: Once | INTRAVENOUS | Status: AC
  Filled 2021-04-09: qty 250

## 2021-04-09 MED ORDER — ONDANSETRON HCL 8 MG PO TABS
ORAL_TABLET | ORAL | Status: AC
  Filled 2021-04-09: qty 1

## 2021-04-09 MED ORDER — DIPHENHYDRAMINE HCL 50 MG/ML IJ SOLN
25.0000 mg | Freq: Once | INTRAMUSCULAR | Status: AC
  Administered 2021-04-09: 25 mg via INTRAVENOUS

## 2021-04-09 MED ORDER — DIPHENHYDRAMINE HCL 50 MG/ML IJ SOLN
INTRAMUSCULAR | Status: AC
  Filled 2021-04-09: qty 1

## 2021-04-09 MED ORDER — ACETAMINOPHEN 325 MG PO TABS
ORAL_TABLET | ORAL | Status: AC
  Filled 2021-04-09: qty 2

## 2021-04-09 MED ORDER — SODIUM CHLORIDE 0.9% FLUSH
10.0000 mL | INTRAVENOUS | Status: DC | PRN
  Administered 2021-04-09: 10 mL
  Filled 2021-04-09: qty 10

## 2021-04-09 MED ORDER — ONDANSETRON HCL 8 MG PO TABS
8.0000 mg | ORAL_TABLET | Freq: Once | ORAL | Status: AC
  Administered 2021-04-09: 8 mg via ORAL

## 2021-04-09 NOTE — Patient Instructions (Signed)
[facility]  Discharge Instructions: Thank you for choosing [facility] to provide your oncology and hematology care.   If you have a lab appointment with the Cancer Center, please go directly to the Cancer Center and check in at the registration area.   Wear comfortable clothing and clothing appropriate for easy access to any Portacath or PICC line.   We strive to give you quality time with your provider. You may need to reschedule your appointment if you arrive late (15 or more minutes).  Arriving late affects you and other patients whose appointments are after yours.  Also, if you miss three or more appointments without notifying the office, you may be dismissed from the clinic at the provider's discretion.      For prescription refill requests, have your pharmacy contact our office and allow 72 hours for refills to be completed.       To help prevent nausea and vomiting after your treatment, we encourage you to take your nausea medication as directed.  BELOW ARE SYMPTOMS THAT SHOULD BE REPORTED IMMEDIATELY: *FEVER GREATER THAN 100.4 F (38 C) OR HIGHER *CHILLS OR SWEATING *NAUSEA AND VOMITING THAT IS NOT CONTROLLED WITH YOUR NAUSEA MEDICATION *UNUSUAL SHORTNESS OF BREATH *UNUSUAL BRUISING OR BLEEDING *URINARY PROBLEMS (pain or burning when urinating, or frequent urination) *BOWEL PROBLEMS (unusual diarrhea, constipation, pain near the anus) TENDERNESS IN MOUTH AND THROAT WITH OR WITHOUT PRESENCE OF ULCERS (sore throat, sores in mouth, or a toothache) UNUSUAL RASH, SWELLING OR PAIN  UNUSUAL VAGINAL DISCHARGE OR ITCHING   Items with * indicate a potential emergency and should be followed up as soon as possible or go to the Emergency Department if any problems should occur.  Please show the CHEMOTHERAPY ALERT CARD or IMMUNOTHERAPY ALERT CARD at check-in to the Emergency Department and triage nurse.  Should you have questions after your  visit or need to cancel or reschedule your appointment, please contact [facility]  Dept: [phone]  and follow the prompts.  Office hours are 8:00 a.m. to 4:30 p.m. Monday - Friday. Please note that voicemails left after 4:00 p.m. may not be returned until the following business day.  We are closed weekends and major holidays. You have access to a nurse at all times for urgent questions. Please call the main number to the clinic Dept: [phone] and follow the prompts.   For any non-urgent questions, you may also contact your provider using MyChart. We now offer e-Visits for anyone 18 and older to request care online for non-urgent symptoms. For details visit [email or web address].   Also download the MyChart app! Go to the app store, search "MyChart", open the app, select , and log in with your MyChart username and password.  Due to Covid, a mask is required upon entering the hospital/clinic. If you do not have a mask, one will be given to you upon arrival. For doctor visits, patients may have 1 support person aged 18 or older with them. For treatment visits, patients cannot have anyone with them due to current Covid guidelines and our immunocompromised population.   

## 2021-04-09 NOTE — Addendum Note (Signed)
Addended by: Alla Feeling on: 04/09/2021 12:57 PM   Modules accepted: Orders

## 2021-04-10 ENCOUNTER — Telehealth: Payer: Self-pay | Admitting: Hematology

## 2021-04-10 NOTE — Telephone Encounter (Signed)
Scheduled follow-up appointment per 4/27 los. Patient is aware. 

## 2021-04-16 ENCOUNTER — Inpatient Hospital Stay: Payer: BC Managed Care – PPO

## 2021-04-16 ENCOUNTER — Other Ambulatory Visit: Payer: Self-pay

## 2021-04-16 ENCOUNTER — Encounter: Payer: Self-pay | Admitting: Hematology

## 2021-04-16 ENCOUNTER — Other Ambulatory Visit: Payer: BC Managed Care – PPO

## 2021-04-16 ENCOUNTER — Inpatient Hospital Stay: Payer: BC Managed Care – PPO | Attending: Hematology

## 2021-04-16 VITALS — BP 148/77 | HR 91 | Temp 98.3°F | Resp 18 | Wt 207.0 lb

## 2021-04-16 DIAGNOSIS — C50212 Malignant neoplasm of upper-inner quadrant of left female breast: Secondary | ICD-10-CM

## 2021-04-16 DIAGNOSIS — Z5112 Encounter for antineoplastic immunotherapy: Secondary | ICD-10-CM | POA: Insufficient documentation

## 2021-04-16 DIAGNOSIS — G629 Polyneuropathy, unspecified: Secondary | ICD-10-CM | POA: Insufficient documentation

## 2021-04-16 DIAGNOSIS — Z170 Estrogen receptor positive status [ER+]: Secondary | ICD-10-CM | POA: Insufficient documentation

## 2021-04-16 DIAGNOSIS — Z5111 Encounter for antineoplastic chemotherapy: Secondary | ICD-10-CM | POA: Diagnosis present

## 2021-04-16 DIAGNOSIS — Z79899 Other long term (current) drug therapy: Secondary | ICD-10-CM | POA: Insufficient documentation

## 2021-04-16 DIAGNOSIS — N951 Menopausal and female climacteric states: Secondary | ICD-10-CM | POA: Diagnosis not present

## 2021-04-16 DIAGNOSIS — Z95828 Presence of other vascular implants and grafts: Secondary | ICD-10-CM

## 2021-04-16 DIAGNOSIS — C50912 Malignant neoplasm of unspecified site of left female breast: Secondary | ICD-10-CM

## 2021-04-16 LAB — CBC WITH DIFFERENTIAL (CANCER CENTER ONLY)
Abs Immature Granulocytes: 0.03 10*3/uL (ref 0.00–0.07)
Basophils Absolute: 0 10*3/uL (ref 0.0–0.1)
Basophils Relative: 1 %
Eosinophils Absolute: 0.1 10*3/uL (ref 0.0–0.5)
Eosinophils Relative: 2 %
HCT: 36.4 % (ref 36.0–46.0)
Hemoglobin: 12.2 g/dL (ref 12.0–15.0)
Immature Granulocytes: 1 %
Lymphocytes Relative: 48 %
Lymphs Abs: 2.2 10*3/uL (ref 0.7–4.0)
MCH: 29.4 pg (ref 26.0–34.0)
MCHC: 33.5 g/dL (ref 30.0–36.0)
MCV: 87.7 fL (ref 80.0–100.0)
Monocytes Absolute: 0.4 10*3/uL (ref 0.1–1.0)
Monocytes Relative: 9 %
Neutro Abs: 1.8 10*3/uL (ref 1.7–7.7)
Neutrophils Relative %: 39 %
Platelet Count: 278 10*3/uL (ref 150–400)
RBC: 4.15 MIL/uL (ref 3.87–5.11)
RDW: 13.1 % (ref 11.5–15.5)
WBC Count: 4.5 10*3/uL (ref 4.0–10.5)
nRBC: 0 % (ref 0.0–0.2)

## 2021-04-16 LAB — CMP (CANCER CENTER ONLY)
ALT: 34 U/L (ref 0–44)
AST: 27 U/L (ref 15–41)
Albumin: 4 g/dL (ref 3.5–5.0)
Alkaline Phosphatase: 102 U/L (ref 38–126)
Anion gap: 8 (ref 5–15)
BUN: 16 mg/dL (ref 8–23)
CO2: 27 mmol/L (ref 22–32)
Calcium: 9.7 mg/dL (ref 8.9–10.3)
Chloride: 107 mmol/L (ref 98–111)
Creatinine: 0.8 mg/dL (ref 0.44–1.00)
GFR, Estimated: >60 mL/min (ref >60)
Glucose, Bld: 95 mg/dL (ref 70–99)
Potassium: 4 mmol/L (ref 3.5–5.1)
Sodium: 142 mmol/L (ref 135–145)
Total Bilirubin: 0.3 mg/dL (ref 0.3–1.2)
Total Protein: 6.7 g/dL (ref 6.5–8.1)

## 2021-04-16 MED ORDER — ACETAMINOPHEN 325 MG PO TABS
ORAL_TABLET | ORAL | Status: AC
  Filled 2021-04-16: qty 2

## 2021-04-16 MED ORDER — HEPARIN SOD (PORK) LOCK FLUSH 100 UNIT/ML IV SOLN
500.0000 [IU] | Freq: Once | INTRAVENOUS | Status: AC | PRN
  Administered 2021-04-16: 500 [IU]
  Filled 2021-04-16: qty 5

## 2021-04-16 MED ORDER — TRASTUZUMAB-DKST CHEMO 150 MG IV SOLR
2.0000 mg/kg | Freq: Once | INTRAVENOUS | Status: AC
  Administered 2021-04-16: 189 mg via INTRAVENOUS
  Filled 2021-04-16: qty 9

## 2021-04-16 MED ORDER — ONDANSETRON HCL 8 MG PO TABS
8.0000 mg | ORAL_TABLET | Freq: Once | ORAL | Status: AC
  Administered 2021-04-16: 8 mg via ORAL

## 2021-04-16 MED ORDER — SODIUM CHLORIDE 0.9 % IV SOLN
Freq: Once | INTRAVENOUS | Status: AC
Start: 2021-04-16 — End: 2021-04-16
  Filled 2021-04-16: qty 250

## 2021-04-16 MED ORDER — ACETAMINOPHEN 325 MG PO TABS
650.0000 mg | ORAL_TABLET | Freq: Once | ORAL | Status: AC
Start: 2021-04-16 — End: 2021-04-16
  Administered 2021-04-16: 650 mg via ORAL

## 2021-04-16 MED ORDER — DIPHENHYDRAMINE HCL 50 MG/ML IJ SOLN
INTRAMUSCULAR | Status: AC
  Filled 2021-04-16: qty 1

## 2021-04-16 MED ORDER — PACLITAXEL PROTEIN-BOUND CHEMO INJECTION 100 MG
100.0000 mg/m2 | Freq: Once | INTRAVENOUS | Status: AC
  Administered 2021-04-16: 200 mg via INTRAVENOUS
  Filled 2021-04-16: qty 40

## 2021-04-16 MED ORDER — DIPHENHYDRAMINE HCL 50 MG/ML IJ SOLN
25.0000 mg | Freq: Once | INTRAMUSCULAR | Status: AC
  Administered 2021-04-16: 25 mg via INTRAVENOUS

## 2021-04-16 MED ORDER — SODIUM CHLORIDE 0.9% FLUSH
10.0000 mL | Freq: Once | INTRAVENOUS | Status: AC
  Administered 2021-04-16: 10 mL
  Filled 2021-04-16: qty 10

## 2021-04-16 MED ORDER — SODIUM CHLORIDE 0.9% FLUSH
10.0000 mL | INTRAVENOUS | Status: DC | PRN
  Administered 2021-04-16: 10 mL
  Filled 2021-04-16: qty 10

## 2021-04-16 MED ORDER — ONDANSETRON HCL 8 MG PO TABS
ORAL_TABLET | ORAL | Status: AC
  Filled 2021-04-16: qty 1

## 2021-04-16 NOTE — Patient Instructions (Signed)
[facility]  Discharge Instructions: Thank you for choosing [facility] to provide your oncology and hematology care.   If you have a lab appointment with the [facility], please go directly to the [facility] and check in at the registration area.   Wear comfortable clothing and clothing appropriate for easy access to any Portacath or PICC line.   We strive to give you quality time with your provider. You may need to reschedule your appointment if you arrive late (15 or more minutes).  Arriving late affects you and other patients whose appointments are after yours.  Also, if you miss three or more appointments without notifying the office, you may be dismissed from the clinic at the provider's discretion.      For prescription refill requests, have your pharmacy contact our office and allow 72 hours for refills to be completed.    Today you received the following chemotherapy and/or immunotherapy agents: trastuzumab, paclitaxel-protein bound.       To help prevent nausea and vomiting after your treatment, we encourage you to take your nausea medication as directed.  BELOW ARE SYMPTOMS THAT SHOULD BE REPORTED IMMEDIATELY: . *FEVER GREATER THAN 100.4 F (38 C) OR HIGHER . *CHILLS OR SWEATING . *NAUSEA AND VOMITING THAT IS NOT CONTROLLED WITH YOUR NAUSEA MEDICATION . *UNUSUAL SHORTNESS OF BREATH . *UNUSUAL BRUISING OR BLEEDING . *URINARY PROBLEMS (pain or burning when urinating, or frequent urination) . *BOWEL PROBLEMS (unusual diarrhea, constipation, pain near the anus) . TENDERNESS IN MOUTH AND THROAT WITH OR WITHOUT PRESENCE OF ULCERS (sore throat, sores in mouth, or a toothache) . UNUSUAL RASH, SWELLING OR PAIN  . UNUSUAL VAGINAL DISCHARGE OR ITCHING   Items with * indicate a potential emergency and should be followed up as soon as possible or go to the Emergency Department if any problems should occur.  Please show the CHEMOTHERAPY  ALERT CARD or IMMUNOTHERAPY ALERT CARD at check-in to the Emergency Department and triage nurse.  Should you have questions after your visit or need to cancel or reschedule your appointment, please contact [facility]  Dept: [phone]  and follow the prompts.  Office hours are 8:00 a.m. to 4:30 p.m. Monday - Friday. Please note that voicemails left after 4:00 p.m. may not be returned until the following business day.  We are closed weekends and major holidays. You have access to a nurse at all times for urgent questions. Please call the main number to the clinic Dept: [phone] and follow the prompts.   For any non-urgent questions, you may also contact your provider using MyChart. We now offer e-Visits for anyone 7 and older to request care online for non-urgent symptoms. For details visit [email or web address].   Also download the MyChart app! Go to the app store, search "MyChart", open the app, select [facility], and log in with your MyChart username and password.  Due to Covid, a mask is required upon entering the hospital/clinic. If you do not have a mask, one will be given to you upon arrival. For doctor visits, patients may have 1 support person aged 68 or older with them. For treatment visits, patients cannot have anyone with them due to current Covid guidelines and our immunocompromised population.

## 2021-04-18 NOTE — Progress Notes (Signed)
Aimee Mcclain   Telephone:([phone]) [phone] Fax:(336) [phone]   Clinic Follow up Note   Patient Care Team: [name], MD as PCP - General (Family Medicine) Rockwell Germany, RN as Oncology Nurse Navigator Mauro Kaufmann, RN as Oncology Nurse Navigator Stark Klein, MD as Consulting Physician (General Surgery) Truitt Merle, MD as Consulting Physician (Hematology) Eppie Gibson, MD as Attending Physician (Radiation Oncology)  Date of Service:  04/23/2021  CHIEF COMPLAINT: F/u of left breast cancer   SUMMARY OF ONCOLOGIC HISTORY: Oncology History Overview Note  Cancer Staging Malignant neoplasm of upper-inner quadrant of left breast in female, estrogen receptor positive ([place]) Staging form: Breast, AJCC 8th Edition - Clinical stage from 01/14/2020: Stage IB (cT1c, cN1, cM0, G3, ER+, PR+, HER2+) - Signed by Truitt Merle, MD on 01/21/2021 Stage prefix: Initial diagnosis    Malignant neoplasm of upper-inner quadrant of left breast in female, estrogen receptor positive ([place])  01/14/2020 Cancer Staging   Staging form: Breast, AJCC 8th Edition - Clinical stage from 01/14/2020: Stage IB (cT1c, cN1, cM0, G3, ER+, PR+, HER2+) - Signed by Truitt Merle, MD on 01/21/2021 Stage prefix: Initial diagnosis   01/02/2021 Mammogram   Left breast with 1.9x1x1.8 cm complex cyst at 12:00 position, 4cmfn, versus saloid mass with calcifications, irregulr, increased in size. Biopsy recommended.    01/13/2021 Initial Biopsy   Diagnosis 1. Breast, left, needle core biopsy, 11 o'clock, 4cmfn - INVASIVE DUCTAL CARCINOMA - SEE COMMENT 2. Lymph node, needle/core biopsy, left axilla - FIBROVASCULAR AND ADIPOSE TISSUE - NO LYMPHOID TISSUE OR CARCINOMA IDENTIFIED Microscopic Comment 1. Based on the biopsy, the carcinoma appears Nottingham grade 3 of 3 and measures 1.2 cm in greatest linear extent. Prognostic markers (ER/PR/ki-67/HER2) are pending and will be reported in an addendum. Dr. Jeannie Done reviewed  the case and agrees with the above diagnosis. These results were called to [facility] on January 14, 2021   01/13/2021 Receptors her2   1. PROGNOSTIC INDICATORS Results: IMMUNOHISTOCHEMICAL AND MORPHOMETRIC ANALYSIS PERFORMED MANUALLY The tumor cells are POSITIVE for Her2 (3+). Estrogen Receptor: 95%, POSITIVE, STRONG STAINING INTENSITY Progesterone Receptor: 85%, POSITIVE, STRONG STAINING INTENSITY Proliferation Marker Ki67: 30%   01/16/2021 Initial Diagnosis   Malignant neoplasm of upper-inner quadrant of left breast in female, estrogen receptor positive ([place])   01/23/2021 Imaging   MRI Breast  IMPRESSION: 1. Known malignancy measuring 2.9 centimeters in the UPPER INNER QUADRANT of the LEFT breast. There is no evidence for involvement of the pectoralis muscle or implant. 2. No evidence for lymphadenopathy. 3. RIGHT breast is negative. 4. Bilateral retropectoral saline implants.   02/09/2021 Imaging   MRI Brain IMPRESSION: No evidence of intracranial metastatic disease.   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     02/13/2021 Surgery   LEFT BREAST LUMPECTOMY WITH RADIOACTIVE SEED AND SENTINEL LYMPH NODE BIOPSY WITH SEED TARGETED NODE and PAC Placed by Dr Barry Dienes   02/13/2021 Pathology Results   FINAL MICROSCOPIC DIAGNOSIS:   A. BREAST, LEFT, LUMPECTOMY:  - Invasive ductal carcinoma, 2.6 cm, Nottingham grade 3 of 3.  - Ductal carcinoma in situ, high grade with central necrosis.  - Margins of resection:       - Invasive carcinoma broadly involves the anterior and inferior  margins.  Invasive carcinoma is focally < 1 mm from each the superior  and medial margins.       - DCIS is focally < 1 mm from the inferior margin.  DCIS is focally  1 mm from each the  anterior and superior margins.  DCIS is focally 1-2  mm from the medial margin.  - Biopsy clip.  - See oncology table.   B. BREAST, LEFT, ANTERIOR TISSUE, EXCISION:  - Residual invasive and in-situ ductal  carcinoma.  - Residual invasive ductal carcinoma broadly involves the posterior  margin.  - Residual DCIS is focally 1 mm from the posterior margin.   C. BREAST, LEFT, INFERIOR MARGIN, EXCISION:  - No carcinoma identified.   D. SENTINEL LYMPH NODE, LEFT AXILLARY #1, BIOPSY:  - No carcinoma identified in one lymph node (0/1).   E. SENTINEL LYMPH NODE, LEFT AXILLARY #2, BIOPSY:  - Biopsy site.  - No distinct nodal tissue identified.  - No carcinoma identified.   F. SENTINEL LYMPH NODE, LEFT AXILLARY #3, BIOPSY:  - No carcinoma identified in one lymph node (0/1).   G. SENTINEL LYMPH NODE, LEFT AXILLARY #4, BIOPSY:  - No carcinoma identified in one lymph node (0/1).    02/13/2021 Cancer Staging   Staging form: Breast, AJCC 8th Edition - Pathologic stage from 02/13/2021: Stage IA (pT2, pN0, cM0, G3, ER+, PR+, HER2+) - Signed by Heilingoetter, Cassandra L, PA-C on 03/07/2021 Stage prefix: Initial diagnosis Nuclear grade: G3 Histologic grading system: 3 grade system   03/18/2021 -  Chemotherapy    Patient is on Treatment Plan: BREAST PACLITAXEL + TRASTUZUMAB Q7D / TRASTUZUMAB Q21D         CURRENT THERAPY:  Adjuvant chemo weekly taxol and herceptin q12 weeks followed by maintenance herceptin q3 weeks to complete 1 year, starting 03/18/2021  INTERVAL HISTORY:  Aimee Mcclain is here for a follow up and treatment. She was last seen by me on 03/25/21 and seen by NP [name] in interim. She presents to the clinic with her husband.  She is tolerating Abraxane better, less muscular pain, did have one episode of severe leg pain last week which lasted for several hours. She also reports eye twitching, no water eye, pain or vision change. No N/V, or other new complains, she eats well, weight is stable. Mild numberness  All other systems were reviewed with the patient and are negative.  MEDICAL HISTORY:  Past Medical History:  Diagnosis Date  . Breast cancer ([place])   . Cataract   . Colon  polyps   . Endometriosis   . Family history of colon cancer   . Family history of colonic polyps   . GERD (gastroesophageal reflux disease)   . H/O bilateral breast implants   . History of colon polyps   . HPV in female   . HSV (herpes simplex virus) infection   . Migraine   . S/P TAH (total abdominal hysterectomy) 1991    SURGICAL HISTORY: Past Surgical History:  Procedure Laterality Date  . ABDOMINAL HYSTERECTOMY    . BREAST BIOPSY     Benign.  [facility] BREAST ENHANCEMENT SURGERY    . BREAST LUMPECTOMY Bilateral 1993 and 1995  . BREAST LUMPECTOMY WITH RADIOACTIVE SEED AND SENTINEL LYMPH NODE BIOPSY Left 02/13/2021   Procedure: LEFT BREAST LUMPECTOMY WITH RADIOACTIVE SEED AND SENTINEL LYMPH NODE BIOPSY WITH SEED TARGETED NODE;  Surgeon: Stark Klein, MD;  Location: [facility];  Service: General;  Laterality: Left;  . CESAREAN SECTION    . PARTIAL HYSTERECTOMY    . PORTACATH PLACEMENT Right 02/13/2021   Procedure: INSERTION PORT-A-CATH;  Surgeon: Stark Klein, MD;  Location: [facility];  Service: General;  Laterality: Right;  . TONSILLECTOMY    . WISDOM  TOOTH EXTRACTION      I have reviewed the social history and family history with the patient and they are unchanged from previous note.  ALLERGIES:  is allergic to ibuprofen, omnicef [cefdinir], proton pump inhibitors, roxicodone [oxycodone hcl], and vioxx [rofecoxib].  MEDICATIONS:  Current Outpatient Medications  Medication Sig Dispense Refill  . acetaminophen (TYLENOL) 500 MG tablet Take 1,000 mg by mouth every 6 (six) hours as needed.    . Calcium 600-200 MG-UNIT tablet Take 1 tablet by mouth daily.    . calcium carbonate (TUMS - DOSED IN MG ELEMENTAL CALCIUM) 500 MG chewable tablet Chew 1 tablet by mouth as needed for indigestion or heartburn. Takes 5-6 per day    . lidocaine-prilocaine (EMLA) cream Apply 1 application topically as needed. 30 g 2  . Multiple Vitamin (MULTIVITAMIN) tablet Take 1  tablet by mouth daily.    . ondansetron (ZOFRAN) 8 MG tablet Take 1 tablet (8 mg total) by mouth every 8 (eight) hours as needed for nausea or vomiting. 30 tablet 2  . prochlorperazine (COMPAZINE) 10 MG tablet Take 1 tablet (10 mg total) by mouth every 6 (six) hours as needed. 30 tablet 2  . valACYclovir (VALTREX) 500 MG tablet Take 1 tablet (500 mg total) by mouth 2 (two) times daily as needed.    . Vitamin B Complex-C CAPS Take 1 capsule by mouth.     No current facility-administered medications for this visit.    PHYSICAL EXAMINATION: ECOG PERFORMANCE STATUS: 1 - Symptomatic but completely ambulatory  Vitals:   04/23/21 0846  BP: (!) 153/80  Pulse: 87  Resp: 18  Temp: (!) 97.2 F (36.2 C)  SpO2: 97%   Filed Weights   04/23/21 0846  Weight: 206 lb 8 oz (93.7 kg)    GENERAL:alert, no distress and comfortable SKIN: skin color, texture, turgor are normal, no rashes or significant lesions EYES: normal, Conjunctiva are pink and non-injected, sclera clear  NECK: supple, thyroid normal size, non-tender, without nodularity LYMPH:  no palpable lymphadenopathy in the cervical, axillary  LUNGS: clear to auscultation and percussion with normal breathing effort HEART: regular rate & rhythm and no murmurs and no lower extremity edema ABDOMEN:abdomen soft, non-tender and normal bowel sounds Musculoskeletal:no cyanosis of digits and no clubbing  NEURO: alert & oriented x 3 with fluent speech, no focal motor/sensory deficits  LABORATORY DATA:  I have reviewed the data as listed CBC Latest Ref Rng & Units 04/23/2021 04/16/2021 04/09/2021  WBC 4.0 - 10.5 K/uL 3.6(L) 4.5 3.6(L)  Hemoglobin 12.0 - 15.0 g/dL 12.1 12.2 12.6  Hematocrit 36.0 - 46.0 % 35.7(L) 36.4 37.5  Platelets 150 - 400 K/uL 251 278 250     CMP Latest Ref Rng & Units 04/16/2021 04/09/2021 04/02/2021  Glucose 70 - 99 mg/dL 95 94 104(H)  BUN 8 - 23 mg/dL 16 14 13   Creatinine 0.44 - 1.00 mg/dL 0.80 0.78 0.79  Sodium 135 - 145  mmol/L 142 141 142  Potassium 3.5 - 5.1 mmol/L 4.0 4.5 3.9  Chloride 98 - 111 mmol/L 107 108 108  CO2 22 - 32 mmol/L 27 26 25   Calcium 8.9 - 10.3 mg/dL 9.7 9.3 9.0  Total Protein 6.5 - 8.1 g/dL 6.7 6.8 6.5  Total Bilirubin 0.3 - 1.2 mg/dL 0.3 0.4 0.3  Alkaline Phos 38 - 126 U/L 102 93 97  AST 15 - 41 U/L 27 34 26  ALT 0 - 44 U/L 34 57(H) 34      RADIOGRAPHIC STUDIES: I  have personally reviewed the radiological images as listed and agreed with the findings in the report. No results found.   ASSESSMENT & PLAN:  Aimee Loose [place] is a 65 y.o. female with    1.Malignant neoplasm of upper-inner quadrant of left breast, cT1cN1M0stage Ib, ER+/PR+/HER2+,pT2N0 stage IA, grade 3  -Diagnosed 12/2020 s/p left lumpectomy and SLNB with Port-A-Cath placement 02/13/2021 by Dr. Barry Dienes -Adjuvant therapy with weekly Taxol and Herceptin followed by maintenance Herceptin every 3 weeks to complete 1 year was recommended to reduce her risk of recurrence. Given her tumor<3 cm and negative lymph node, I did not recommend more intensive chemo. -Staging work-up showed small indeterminate lung nodules and an indeterminate left parietal bone lesion which will monitor with future scans, otherwise negative for distant metastasis -She started weekly taxol/herceptin on 03/18/21, developed possible taxol reaction after C2 with flushing and chest heaviness at home, resolved. No intervention needed. Switched to abraxane with C3, tolerating much better, still has occasional muscular pain  -Her treatment plan includes adjuvant radiation after chemotherapy, and antiestrogen to follow.  -she is clinically doing well, lab reviewed, adequate for treatment, will proceed with C6 chemo today -f/u in 2 weeks    2.Peripheral neuropathy -She has baseline neuropathy in her feet, etiology unknown -She is not diabetic, B12 level was normal  -No functional deficits -Taking B complex vitamin -She adheres to cryotherapy during  chemo infusions -We will monitor closely on chemo. overall stable  3.Hot flashes -She was previously on HRT which she stopped with cancer diagnosis in 12/2020 -Hot flashes are mild and tolerable at this time -We discussed SSRI versus gabapentin, she declined for now but may be interested if she has increased hot flashes on antiestrogen therapy  4. small indeterminate lung nodules and an indeterminate left parietal bone lesion on scan  -will follow up with repeated brain MRI in 3 months and probably CT chest in 6 months    PLAN:  -lab reviewed, plan to proceed with Abraxane and Trastuzumab and continue weekly -f/u in 2 weeks  -I called in Tramadol for chemo related sever leg pain as needed    No problem-specific Assessment & Plan notes found for this encounter.   No orders of the defined types were placed in this encounter.  All questions were answered. The patient knows to call the clinic with any problems, questions or concerns. No barriers to learning was detected. The total time spent in the appointment was 30 minutes.     Truitt Merle, MD 04/23/2021   I, Joslyn Devon, am acting as scribe for Truitt Merle, MD.   I have reviewed the above documentation for accuracy and completeness, and I agree with the above.

## 2021-04-23 ENCOUNTER — Encounter: Payer: Self-pay | Admitting: *Deleted

## 2021-04-23 ENCOUNTER — Other Ambulatory Visit: Payer: Self-pay

## 2021-04-23 ENCOUNTER — Inpatient Hospital Stay: Payer: BC Managed Care – PPO

## 2021-04-23 ENCOUNTER — Encounter: Payer: Self-pay | Admitting: Hematology

## 2021-04-23 ENCOUNTER — Inpatient Hospital Stay (HOSPITAL_BASED_OUTPATIENT_CLINIC_OR_DEPARTMENT_OTHER): Payer: BC Managed Care – PPO | Admitting: Hematology

## 2021-04-23 ENCOUNTER — Other Ambulatory Visit: Payer: BC Managed Care – PPO

## 2021-04-23 VITALS — BP 153/80 | HR 87 | Temp 97.2°F | Resp 18 | Wt 206.5 lb

## 2021-04-23 DIAGNOSIS — Z170 Estrogen receptor positive status [ER+]: Secondary | ICD-10-CM

## 2021-04-23 DIAGNOSIS — C50912 Malignant neoplasm of unspecified site of left female breast: Secondary | ICD-10-CM

## 2021-04-23 DIAGNOSIS — Z95828 Presence of other vascular implants and grafts: Secondary | ICD-10-CM

## 2021-04-23 DIAGNOSIS — C50212 Malignant neoplasm of upper-inner quadrant of left female breast: Secondary | ICD-10-CM

## 2021-04-23 LAB — CBC WITH DIFFERENTIAL (CANCER CENTER ONLY)
Abs Immature Granulocytes: 0.03 10*3/uL (ref 0.00–0.07)
Basophils Absolute: 0.1 10*3/uL (ref 0.0–0.1)
Basophils Relative: 1 %
Eosinophils Absolute: 0.1 10*3/uL (ref 0.0–0.5)
Eosinophils Relative: 1 %
HCT: 35.7 % — ABNORMAL LOW (ref 36.0–46.0)
Hemoglobin: 12.1 g/dL (ref 12.0–15.0)
Immature Granulocytes: 1 %
Lymphocytes Relative: 44 %
Lymphs Abs: 1.6 10*3/uL (ref 0.7–4.0)
MCH: 29.7 pg (ref 26.0–34.0)
MCHC: 33.9 g/dL (ref 30.0–36.0)
MCV: 87.5 fL (ref 80.0–100.0)
Monocytes Absolute: 0.3 10*3/uL (ref 0.1–1.0)
Monocytes Relative: 9 %
Neutro Abs: 1.6 10*3/uL — ABNORMAL LOW (ref 1.7–7.7)
Neutrophils Relative %: 44 %
Platelet Count: 251 10*3/uL (ref 150–400)
RBC: 4.08 MIL/uL (ref 3.87–5.11)
RDW: 13.5 % (ref 11.5–15.5)
WBC Count: 3.6 10*3/uL — ABNORMAL LOW (ref 4.0–10.5)
nRBC: 0 % (ref 0.0–0.2)

## 2021-04-23 LAB — CMP (CANCER CENTER ONLY)
ALT: 28 U/L (ref 0–44)
AST: 25 U/L (ref 15–41)
Albumin: 4.1 g/dL (ref 3.5–5.0)
Alkaline Phosphatase: 90 U/L (ref 38–126)
Anion gap: 10 (ref 5–15)
BUN: 15 mg/dL (ref 8–23)
CO2: 27 mmol/L (ref 22–32)
Calcium: 9.6 mg/dL (ref 8.9–10.3)
Chloride: 107 mmol/L (ref 98–111)
Creatinine: 0.85 mg/dL (ref 0.44–1.00)
GFR, Estimated: >60 mL/min (ref >60)
Glucose, Bld: 84 mg/dL (ref 70–99)
Potassium: 3.8 mmol/L (ref 3.5–5.1)
Sodium: 144 mmol/L (ref 135–145)
Total Bilirubin: 0.5 mg/dL (ref 0.3–1.2)
Total Protein: 6.8 g/dL (ref 6.5–8.1)

## 2021-04-23 MED ORDER — SODIUM CHLORIDE 0.9% FLUSH
10.0000 mL | INTRAVENOUS | Status: DC | PRN
  Administered 2021-04-23: 10 mL
  Filled 2021-04-23: qty 10

## 2021-04-23 MED ORDER — DIPHENHYDRAMINE HCL 50 MG/ML IJ SOLN
25.0000 mg | Freq: Once | INTRAMUSCULAR | Status: AC
  Administered 2021-04-23: 25 mg via INTRAVENOUS

## 2021-04-23 MED ORDER — HEPARIN SOD (PORK) LOCK FLUSH 100 UNIT/ML IV SOLN
500.0000 [IU] | Freq: Once | INTRAVENOUS | Status: AC | PRN
  Administered 2021-04-23: 500 [IU]
  Filled 2021-04-23: qty 5

## 2021-04-23 MED ORDER — SODIUM CHLORIDE 0.9% FLUSH
10.0000 mL | Freq: Once | INTRAVENOUS | Status: AC
  Administered 2021-04-23: 10 mL
  Filled 2021-04-23: qty 10

## 2021-04-23 MED ORDER — TRASTUZUMAB-DKST CHEMO 150 MG IV SOLR
2.0000 mg/kg | Freq: Once | INTRAVENOUS | Status: AC
  Administered 2021-04-23: 189 mg via INTRAVENOUS
  Filled 2021-04-23: qty 9

## 2021-04-23 MED ORDER — PACLITAXEL PROTEIN-BOUND CHEMO INJECTION 100 MG
100.0000 mg/m2 | Freq: Once | INTRAVENOUS | Status: AC
  Administered 2021-04-23: 200 mg via INTRAVENOUS
  Filled 2021-04-23: qty 40

## 2021-04-23 MED ORDER — DIPHENHYDRAMINE HCL 50 MG/ML IJ SOLN
INTRAMUSCULAR | Status: AC
  Filled 2021-04-23: qty 1

## 2021-04-23 MED ORDER — ACETAMINOPHEN 325 MG PO TABS
650.0000 mg | ORAL_TABLET | Freq: Once | ORAL | Status: AC
Start: 2021-04-23 — End: 2021-04-23
  Administered 2021-04-23: 650 mg via ORAL

## 2021-04-23 MED ORDER — ONDANSETRON HCL 8 MG PO TABS
8.0000 mg | ORAL_TABLET | Freq: Once | ORAL | Status: AC
Start: 2021-04-23 — End: 2021-04-23
  Administered 2021-04-23: 8 mg via ORAL

## 2021-04-23 MED ORDER — ACETAMINOPHEN 325 MG PO TABS
ORAL_TABLET | ORAL | Status: AC
  Filled 2021-04-23: qty 2

## 2021-04-23 MED ORDER — SODIUM CHLORIDE 0.9 % IV SOLN
Freq: Once | INTRAVENOUS | Status: AC
Start: 2021-04-23 — End: 2021-04-23
  Filled 2021-04-23: qty 250

## 2021-04-23 MED ORDER — ONDANSETRON HCL 8 MG PO TABS
ORAL_TABLET | ORAL | Status: AC
  Filled 2021-04-23: qty 1

## 2021-04-23 MED ORDER — TRAMADOL HCL 50 MG PO TABS: 50.0000 mg | ORAL_TABLET | Freq: Four times a day (QID) | ORAL | 0 refills | Status: DC | PRN

## 2021-04-23 NOTE — Patient Instructions (Signed)
Implanted Port Insertion, Care After This sheet gives you information about how to care for yourself after your procedure. Your health care provider may also give you more specific instructions. If you have problems or questions, contact your health care provider. What can I expect after the procedure? After the procedure, it is common to have:  Discomfort at the port insertion site.  Bruising on the skin over the port. This should improve over 3-4 days. Follow these instructions at home: Port care  After your port is placed, you will get a manufacturer's information card. The card has information about your port. Keep this card with you at all times.  Take care of the port as told by your health care provider. Ask your health care provider if you or a family member can get training for taking care of the port at home. A home health care nurse may also take care of the port.  Make sure to remember what type of port you have. Incision care  Follow instructions from your health care provider about how to take care of your port insertion site. Make sure you: ? Wash your hands with soap and water before and after you change your bandage (dressing). If soap and water are not available, use hand sanitizer. ? Change your dressing as told by your health care provider. ? Leave stitches (sutures), skin glue, or adhesive strips in place. These skin closures may need to stay in place for 2 weeks or longer. If adhesive strip edges start to loosen and curl up, you may trim the loose edges. Do not remove adhesive strips completely unless your health care provider tells you to do that.  Check your port insertion site every day for signs of infection. Check for: ? Redness, swelling, or pain. ? Fluid or blood. ? Warmth. ? Pus or a bad smell.      Activity  Return to your normal activities as told by your health care provider. Ask your health care provider what activities are safe for you.  Do not  lift anything that is heavier than 10 lb (4.5 kg), or the limit that you are told, until your health care provider says that it is safe. General instructions  Take over-the-counter and prescription medicines only as told by your health care provider.  Do not take baths, swim, or use a hot tub until your health care provider approves. Ask your health care provider if you may take showers. You may only be allowed to take sponge baths.  Do not drive for 24 hours if you were given a sedative during your procedure.  Wear a medical alert bracelet in case of an emergency. This will tell any health care providers that you have a port.  Keep all follow-up visits as told by your health care provider. This is important. Contact a health care provider if:  You cannot flush your port with saline as directed, or you cannot draw blood from the port.  You have a fever or chills.  You have redness, swelling, or pain around your port insertion site.  You have fluid or blood coming from your port insertion site.  Your port insertion site feels warm to the touch.  You have pus or a bad smell coming from the port insertion site. Get help right away if:  You have chest pain or shortness of breath.  You have bleeding from your port that you cannot control. Summary  Take care of the port as told by your   health care provider. Keep the manufacturer's information card with you at all times.  Change your dressing as told by your health care provider.  Contact a health care provider if you have a fever or chills or if you have redness, swelling, or pain around your port insertion site.  Keep all follow-up visits as told by your health care provider. This information is not intended to replace advice given to you by your health care provider. Make sure you discuss any questions you have with your health care provider. Document Revised: 06/28/2018 Document Reviewed: 06/28/2018 Elsevier Patient Education   2021 Elsevier Inc.  

## 2021-04-23 NOTE — Patient Instructions (Signed)
[facility] MEDICAL ONCOLOGY  Discharge Instructions: Thank you for choosing [facility] to provide your oncology and hematology care.   If you have a lab appointment with the Cancer Center, please go directly to the Cancer Center and check in at the registration area.   Wear comfortable clothing and clothing appropriate for easy access to any Portacath or PICC line.   We strive to give you quality time with your provider. You may need to reschedule your appointment if you arrive late (15 or more minutes).  Arriving late affects you and other patients whose appointments are after yours.  Also, if you miss three or more appointments without notifying the office, you may be dismissed from the clinic at the provider's discretion.      For prescription refill requests, have your pharmacy contact our office and allow 72 hours for refills to be completed.    Today you received the following chemotherapy and/or immunotherapy agents abraxane, trastuzumab      To help prevent nausea and vomiting after your treatment, we encourage you to take your nausea medication as directed.  BELOW ARE SYMPTOMS THAT SHOULD BE REPORTED IMMEDIATELY: *FEVER GREATER THAN 100.4 F (38 C) OR HIGHER *CHILLS OR SWEATING *NAUSEA AND VOMITING THAT IS NOT CONTROLLED WITH YOUR NAUSEA MEDICATION *UNUSUAL SHORTNESS OF BREATH *UNUSUAL BRUISING OR BLEEDING *URINARY PROBLEMS (pain or burning when urinating, or frequent urination) *BOWEL PROBLEMS (unusual diarrhea, constipation, pain near the anus) TENDERNESS IN MOUTH AND THROAT WITH OR WITHOUT PRESENCE OF ULCERS (sore throat, sores in mouth, or a toothache) UNUSUAL RASH, SWELLING OR PAIN  UNUSUAL VAGINAL DISCHARGE OR ITCHING   Items with * indicate a potential emergency and should be followed up as soon as possible or go to the Emergency Department if any problems should occur.  Please show the CHEMOTHERAPY ALERT CARD or IMMUNOTHERAPY ALERT CARD at  check-in to the Emergency Department and triage nurse.  Should you have questions after your visit or need to cancel or reschedule your appointment, please contact [facility] MEDICAL [facility]COL[facility]  Dept: [phone]  and follow the prompts.  Office hours are 8:00 a.m. to 4:30 p.m. Monday - Friday. Please note that voicemails left after 4:00 p.m. may not be returned until the following business day.  We are closed weekends and major holidays. You have access to a nurse at all times for urgent questions. Please call the main number to the clinic Dept: [phone] and follow the prompts.   For any non-urgent questions, you may also contact your provider using MyChart. We now offer e-Visits for anyone 18 and older to request care online for non-urgent symptoms. For details visit [email or web address].   Also download the MyChart app! Go to the app store, search "MyChart", open the app, select [facility], and log in with your MyChart username and password.  Due to Covid, a mask is required upon entering the hospital/clinic. If you do not have a mask, one will be given to you upon arrival. For doctor visits, patients may have 1 support person aged 18 or older with them. For treatment visits, patients cannot have anyone with them due to current Covid guidelines and our immunocompromised population.   

## 2021-04-27 ENCOUNTER — Encounter: Payer: Self-pay | Admitting: Hematology

## 2021-04-27 ENCOUNTER — Encounter: Payer: Self-pay | Admitting: Family Medicine

## 2021-04-30 ENCOUNTER — Other Ambulatory Visit: Payer: BC Managed Care – PPO

## 2021-04-30 ENCOUNTER — Other Ambulatory Visit: Payer: Self-pay

## 2021-04-30 ENCOUNTER — Inpatient Hospital Stay: Payer: BC Managed Care – PPO

## 2021-04-30 VITALS — BP 132/71 | HR 78 | Temp 98.1°F | Resp 16 | Wt 207.8 lb

## 2021-04-30 DIAGNOSIS — C50212 Malignant neoplasm of upper-inner quadrant of left female breast: Secondary | ICD-10-CM | POA: Diagnosis not present

## 2021-04-30 DIAGNOSIS — Z170 Estrogen receptor positive status [ER+]: Secondary | ICD-10-CM

## 2021-04-30 DIAGNOSIS — Z95828 Presence of other vascular implants and grafts: Secondary | ICD-10-CM

## 2021-04-30 DIAGNOSIS — C50912 Malignant neoplasm of unspecified site of left female breast: Secondary | ICD-10-CM

## 2021-04-30 LAB — CMP (CANCER CENTER ONLY)
ALT: 26 U/L (ref 0–44)
AST: 23 U/L (ref 15–41)
Albumin: 4 g/dL (ref 3.5–5.0)
Alkaline Phosphatase: 92 U/L (ref 38–126)
Anion gap: 10 (ref 5–15)
BUN: 14 mg/dL (ref 8–23)
CO2: 26 mmol/L (ref 22–32)
Calcium: 9.6 mg/dL (ref 8.9–10.3)
Chloride: 108 mmol/L (ref 98–111)
Creatinine: 0.78 mg/dL (ref 0.44–1.00)
GFR, Estimated: >60 mL/min (ref >60)
Glucose, Bld: 106 mg/dL — ABNORMAL HIGH (ref 70–99)
Potassium: 3.8 mmol/L (ref 3.5–5.1)
Sodium: 144 mmol/L (ref 135–145)
Total Bilirubin: 0.4 mg/dL (ref 0.3–1.2)
Total Protein: 6.8 g/dL (ref 6.5–8.1)

## 2021-04-30 LAB — CBC WITH DIFFERENTIAL (CANCER CENTER ONLY)
Abs Immature Granulocytes: 0.06 10*3/uL (ref 0.00–0.07)
Basophils Absolute: 0.1 10*3/uL (ref 0.0–0.1)
Basophils Relative: 1 %
Eosinophils Absolute: 0 10*3/uL (ref 0.0–0.5)
Eosinophils Relative: 1 %
HCT: 35 % — ABNORMAL LOW (ref 36.0–46.0)
Hemoglobin: 11.8 g/dL — ABNORMAL LOW (ref 12.0–15.0)
Immature Granulocytes: 2 %
Lymphocytes Relative: 38 %
Lymphs Abs: 1.4 10*3/uL (ref 0.7–4.0)
MCH: 29.7 pg (ref 26.0–34.0)
MCHC: 33.7 g/dL (ref 30.0–36.0)
MCV: 88.2 fL (ref 80.0–100.0)
Monocytes Absolute: 0.4 10*3/uL (ref 0.1–1.0)
Monocytes Relative: 9 %
Neutro Abs: 1.9 10*3/uL (ref 1.7–7.7)
Neutrophils Relative %: 49 %
Platelet Count: 274 10*3/uL (ref 150–400)
RBC: 3.97 MIL/uL (ref 3.87–5.11)
RDW: 14 % (ref 11.5–15.5)
WBC Count: 3.8 10*3/uL — ABNORMAL LOW (ref 4.0–10.5)
nRBC: 0 % (ref 0.0–0.2)

## 2021-04-30 MED ORDER — SODIUM CHLORIDE 0.9 % IV SOLN
Freq: Once | INTRAVENOUS | Status: AC
  Filled 2021-04-30: qty 250

## 2021-04-30 MED ORDER — DIPHENHYDRAMINE HCL 50 MG/ML IJ SOLN
INTRAMUSCULAR | Status: AC
  Filled 2021-04-30: qty 1

## 2021-04-30 MED ORDER — PACLITAXEL PROTEIN-BOUND CHEMO INJECTION 100 MG
100.0000 mg/m2 | Freq: Once | INTRAVENOUS | Status: AC
  Administered 2021-04-30: 200 mg via INTRAVENOUS
  Filled 2021-04-30: qty 40

## 2021-04-30 MED ORDER — HEPARIN SOD (PORK) LOCK FLUSH 100 UNIT/ML IV SOLN
500.0000 [IU] | Freq: Once | INTRAVENOUS | Status: AC | PRN
  Administered 2021-04-30: 500 [IU]
  Filled 2021-04-30: qty 5

## 2021-04-30 MED ORDER — SODIUM CHLORIDE 0.9% FLUSH
10.0000 mL | INTRAVENOUS | Status: DC | PRN
  Administered 2021-04-30: 10 mL
  Filled 2021-04-30: qty 10

## 2021-04-30 MED ORDER — TRASTUZUMAB-DKST CHEMO 150 MG IV SOLR
2.0000 mg/kg | Freq: Once | INTRAVENOUS | Status: AC
  Administered 2021-04-30: 189 mg via INTRAVENOUS
  Filled 2021-04-30: qty 9

## 2021-04-30 MED ORDER — SODIUM CHLORIDE 0.9% FLUSH
10.0000 mL | Freq: Once | INTRAVENOUS | Status: AC
  Administered 2021-04-30: 10 mL
  Filled 2021-04-30: qty 10

## 2021-04-30 MED ORDER — ONDANSETRON HCL 8 MG PO TABS
8.0000 mg | ORAL_TABLET | Freq: Once | ORAL | Status: AC
  Administered 2021-04-30: 8 mg via ORAL

## 2021-04-30 MED ORDER — ONDANSETRON HCL 8 MG PO TABS
ORAL_TABLET | ORAL | Status: AC
  Filled 2021-04-30: qty 1

## 2021-04-30 MED ORDER — DIPHENHYDRAMINE HCL 50 MG/ML IJ SOLN
25.0000 mg | Freq: Once | INTRAMUSCULAR | Status: AC
  Administered 2021-04-30: 25 mg via INTRAVENOUS

## 2021-04-30 MED ORDER — ACETAMINOPHEN 325 MG PO TABS
ORAL_TABLET | ORAL | Status: AC
  Filled 2021-04-30: qty 2

## 2021-04-30 MED ORDER — ACETAMINOPHEN 325 MG PO TABS
650.0000 mg | ORAL_TABLET | Freq: Once | ORAL | Status: AC
  Administered 2021-04-30: 650 mg via ORAL

## 2021-04-30 NOTE — Patient Instructions (Signed)
[facility]COL[facility]    Discharge Instructions: Thank you for choosing [facility] to provide your oncology and hematology care.   If you have a lab appointment with the [facility], please go directly to the [facility] and check in at the registration area.   Wear comfortable clothing and clothing appropriate for easy access to any Portacath or PICC line.   We strive to give you quality time with your provider. You may need to reschedule your appointment if you arrive late (15 or more minutes).  Arriving late affects you and other patients whose appointments are after yours.  Also, if you miss three or more appointments without notifying the office, you may be dismissed from the clinic at the provider's discretion.      For prescription refill requests, have your pharmacy contact our office and allow 72 hours for refills to be completed.    Today you received the following chemotherapy and/or immunotherapy agents: paclitaxel-protein bound and trastuzumab.      To help prevent nausea and vomiting after your treatment, we encourage you to take your nausea medication as directed.  BELOW ARE SYMPTOMS THAT SHOULD BE REPORTED IMMEDIATELY: . *FEVER GREATER THAN 100.4 F (38 C) OR HIGHER . *CHILLS OR SWEATING . *NAUSEA AND VOMITING THAT IS NOT CONTROLLED WITH YOUR NAUSEA MEDICATION . *UNUSUAL SHORTNESS OF BREATH . *UNUSUAL BRUISING OR BLEEDING . *URINARY PROBLEMS (pain or burning when urinating, or frequent urination) . *BOWEL PROBLEMS (unusual diarrhea, constipation, pain near the anus) . TENDERNESS IN MOUTH AND THROAT WITH OR WITHOUT PRESENCE OF ULCERS (sore throat, sores in mouth, or a toothache) . UNUSUAL RASH, SWELLING OR PAIN  . UNUSUAL VAGINAL DISCHARGE OR ITCHING   Items with * indicate a potential emergency and should be followed up as soon as possible or go to the Emergency Department if any problems should occur.  Please show the  CHEMOTHERAPY ALERT CARD or IMMUNOTHERAPY ALERT CARD at check-in to the Emergency Department and triage nurse.  Should you have questions after your visit or need to cancel or reschedule your appointment, please contact [facility]  Dept: [phone]  and follow the prompts.  Office hours are 8:00 a.m. to 4:30 p.m. Monday - Friday. Please note that voicemails left after 4:00 p.m. may not be returned until the following business day.  We are closed weekends and major holidays. You have access to a nurse at all times for urgent questions. Please call the main number to the clinic Dept: [phone] and follow the prompts.   For any non-urgent questions, you may also contact your provider using MyChart. We now offer e-Visits for anyone 53 and older to request care online for non-urgent symptoms. For details visit [email or web address].   Also download the MyChart app! Go to the app store, search "MyChart", open the app, select [facility], and log in with your MyChart username and password.  Due to Covid, a mask is required upon entering the hospital/clinic. If you do not have a mask, one will be given to you upon arrival. For doctor visits, patients may have 1 support person aged 63 or older with them. For treatment visits, patients cannot have anyone with them due to current Covid guidelines and our immunocompromised population.

## 2021-05-02 NOTE — Progress Notes (Signed)
[facility]   [phone] [phone]   Clinic Follow up Note   Patient Care Team: [name], MD as PCP - General (Family Medicine) Rockwell Germany, RN as Oncology Nurse Navigator Mauro Kaufmann, RN as Oncology Nurse Navigator Stark Klein, MD as Consulting Physician (General Surgery) Truitt Merle, MD as Consulting Physician (Hematology) Eppie Gibson, MD as Attending Physician (Radiation Oncology)  Date of Service:  05/07/2021  CHIEF COMPLAINT: F/u of left breast cancer   SUMMARY OF ONCOLOGIC HISTORY: Oncology History Overview Note  Cancer Staging Malignant neoplasm of upper-inner quadrant of left breast in female, estrogen receptor positive ([place]) Staging form: Breast, AJCC 8th Edition - Clinical stage from 01/14/2020: Stage IB (cT1c, cN1, cM0, G3, ER+, PR+, HER2+) - Signed by Truitt Merle, MD on 01/21/2021 Stage prefix: Initial diagnosis    Malignant neoplasm of upper-inner quadrant of left breast in female, estrogen receptor positive ([place])  01/14/2020 Cancer Staging   Staging form: Breast, AJCC 8th Edition - Clinical stage from 01/14/2020: Stage IB (cT1c, cN1, cM0, G3, ER+, PR+, HER2+) - Signed by Truitt Merle, MD on 01/21/2021 Stage prefix: Initial diagnosis   01/02/2021 Mammogram   Left breast with 1.9x1x1.8 cm complex cyst at 12:00 position, 4cmfn, versus saloid mass with calcifications, irregulr, increased in size. Biopsy recommended.    01/13/2021 Initial Biopsy   Diagnosis 1. Breast, left, needle core biopsy, 11 o'clock, 4cmfn - INVASIVE DUCTAL CARCINOMA - SEE COMMENT 2. Lymph node, needle/core biopsy, left axilla - FIBROVASCULAR AND ADIPOSE TISSUE - NO LYMPHOID TISSUE OR CARCINOMA IDENTIFIED Microscopic Comment 1. Based on the biopsy, the carcinoma appears Nottingham grade 3 of 3 and measures 1.2 cm in greatest linear extent. Prognostic markers (ER/PR/ki-67/HER2) are pending and will be reported in an addendum. Dr. Jeannie Done reviewed  the case and agrees with the above diagnosis. These results were called to [facility] on January 14, 2021   01/13/2021 Receptors her2   1. PROGNOSTIC INDICATORS Results: IMMUNOHISTOCHEMICAL AND MORPHOMETRIC ANALYSIS PERFORMED MANUALLY The tumor cells are POSITIVE for Her2 (3+). Estrogen Receptor: 95%, POSITIVE, STRONG STAINING INTENSITY Progesterone Receptor: 85%, POSITIVE, STRONG STAINING INTENSITY Proliferation Marker Ki67: 30%   01/16/2021 Initial Diagnosis   Malignant neoplasm of upper-inner quadrant of left breast in female, estrogen receptor positive ([place])   01/23/2021 Imaging   MRI Breast  IMPRESSION: 1. Known malignancy measuring 2.9 centimeters in the UPPER INNER QUADRANT of the LEFT breast. There is no evidence for involvement of the pectoralis muscle or implant. 2. No evidence for lymphadenopathy. 3. RIGHT breast is negative. 4. Bilateral retropectoral saline implants.   02/09/2021 Imaging   MRI Brain IMPRESSION: No evidence of intracranial metastatic disease.   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     02/13/2021 Surgery   LEFT BREAST LUMPECTOMY WITH RADIOACTIVE SEED AND SENTINEL LYMPH NODE BIOPSY WITH SEED TARGETED NODE and PAC Placed by Dr Barry Dienes   02/13/2021 Pathology Results   FINAL MICROSCOPIC DIAGNOSIS:   A. BREAST, LEFT, LUMPECTOMY:  - Invasive ductal carcinoma, 2.6 cm, Nottingham grade 3 of 3.  - Ductal carcinoma in situ, high grade with central necrosis.  - Margins of resection:       - Invasive carcinoma broadly involves the anterior and inferior  margins.  Invasive carcinoma is focally < 1 mm from each the superior  and medial margins.       - DCIS is focally < 1 mm from the inferior margin.  DCIS is focally  1 mm from each the  anterior and superior margins.  DCIS is focally 1-2  mm from the medial margin.  - Biopsy clip.  - See oncology table.   B. BREAST, LEFT, ANTERIOR TISSUE, EXCISION:  - Residual invasive and in-situ ductal  carcinoma.  - Residual invasive ductal carcinoma broadly involves the posterior  margin.  - Residual DCIS is focally 1 mm from the posterior margin.   C. BREAST, LEFT, INFERIOR MARGIN, EXCISION:  - No carcinoma identified.   D. SENTINEL LYMPH NODE, LEFT AXILLARY #1, BIOPSY:  - No carcinoma identified in one lymph node (0/1).   E. SENTINEL LYMPH NODE, LEFT AXILLARY #2, BIOPSY:  - Biopsy site.  - No distinct nodal tissue identified.  - No carcinoma identified.   F. SENTINEL LYMPH NODE, LEFT AXILLARY #3, BIOPSY:  - No carcinoma identified in one lymph node (0/1).   G. SENTINEL LYMPH NODE, LEFT AXILLARY #4, BIOPSY:  - No carcinoma identified in one lymph node (0/1).    02/13/2021 Cancer Staging   Staging form: Breast, AJCC 8th Edition - Pathologic stage from 02/13/2021: Stage IA (pT2, pN0, cM0, G3, ER+, PR+, HER2+) - Signed by Heilingoetter, Cassandra L, PA-C on 03/07/2021 Stage prefix: Initial diagnosis Nuclear grade: G3 Histologic grading system: 3 grade system   03/18/2021 -  Chemotherapy    Patient is on Treatment Plan: BREAST PACLITAXEL + TRASTUZUMAB Q7D / TRASTUZUMAB Q21D         CURRENT THERAPY:  Adjuvant chemo weekly taxol and herceptin q12 weeks followed by maintenance herceptin q3 weeks to complete 1 year,starting 03/18/2021  INTERVAL HISTORY: Aimee Mcclain is here for a follow up. She was last seen by me 04/23/21. She presents to the clinic with her husband.  She has been tolerating chemotherapy well overall.  She does notice mild intermittent tingling on her feet, on her hands.  She has been using ice packs for her hands and feet during chemo and also at home.  She also noticed increased dry cough lately.  She has history of acid reflux and related to cough it seems to be worse lately.  No significant sputum production, no fever, chills, or chest discomfort.  Her appetite is normal, weight is stable.  She has mild fatigue, but functions well at home.  No other complains.      All other systems were reviewed with the patient and are negative.  MEDICAL HISTORY:  Past Medical History:  Diagnosis Date  . Breast cancer ([place])   . Cataract   . Colon polyps   . Endometriosis   . Family history of colon cancer   . Family history of colonic polyps   . GERD (gastroesophageal reflux disease)   . H/O bilateral breast implants   . History of colon polyps   . HPV in female   . HSV (herpes simplex virus) infection   . Migraine   . S/P TAH (total abdominal hysterectomy) 1991    SURGICAL HISTORY: Past Surgical History:  Procedure Laterality Date  . ABDOMINAL HYSTERECTOMY    . BREAST BIOPSY     Benign.  Marland Kitchen BREAST ENHANCEMENT SURGERY    . BREAST LUMPECTOMY Bilateral 1993 and 1995  . BREAST LUMPECTOMY WITH RADIOACTIVE SEED AND SENTINEL LYMPH NODE BIOPSY Left 02/13/2021   Procedure: LEFT BREAST LUMPECTOMY WITH RADIOACTIVE SEED AND SENTINEL LYMPH NODE BIOPSY WITH SEED TARGETED NODE;  Surgeon: Stark Klein, MD;  Location: [facility];  Service: General;  Laterality: Left;  . CESAREAN SECTION    . PARTIAL HYSTERECTOMY    .  PORTACATH PLACEMENT Right 02/13/2021   Procedure: INSERTION PORT-A-CATH;  Surgeon: Stark Klein, MD;  Location: [facility];  Service: General;  Laterality: Right;  . TONSILLECTOMY    . WISDOM TOOTH EXTRACTION      I have reviewed the social history and family history with the patient and they are unchanged from previous note.  ALLERGIES:  is allergic to ibuprofen, omnicef [cefdinir], proton pump inhibitors, roxicodone [oxycodone hcl], and vioxx [rofecoxib].  MEDICATIONS:  Current Outpatient Medications  Medication Sig Dispense Refill  . acetaminophen (TYLENOL) 500 MG tablet Take 1,000 mg by mouth every 6 (six) hours as needed.    . Calcium 600-200 MG-UNIT tablet Take 1 tablet by mouth daily.    . calcium carbonate (TUMS - DOSED IN MG ELEMENTAL CALCIUM) 500 MG chewable tablet Chew 1 tablet by mouth as needed for  indigestion or heartburn. Takes 5-6 per day    . lidocaine-prilocaine (EMLA) cream Apply 1 application topically as needed. 30 g 2  . Multiple Vitamin (MULTIVITAMIN) tablet Take 1 tablet by mouth daily.    . ondansetron (ZOFRAN) 8 MG tablet Take 1 tablet (8 mg total) by mouth every 8 (eight) hours as needed for nausea or vomiting. 30 tablet 2  . prochlorperazine (COMPAZINE) 10 MG tablet Take 1 tablet (10 mg total) by mouth every 6 (six) hours as needed. 30 tablet 2  . traMADol (ULTRAM) 50 MG tablet Take 1 tablet (50 mg total) by mouth every 6 (six) hours as needed. 10 tablet 0  . valACYclovir (VALTREX) 500 MG tablet Take 1 tablet (500 mg total) by mouth 2 (two) times daily as needed.    . Vitamin B Complex-C CAPS Take 1 capsule by mouth.     No current facility-administered medications for this visit.    PHYSICAL EXAMINATION: ECOG PERFORMANCE STATUS: 1 - Symptomatic but completely ambulatory  Vitals:   05/07/21 0801  BP: 135/65  Pulse: 86  Resp: 17  Temp: 97.6 F (36.4 C)  SpO2: 99%   Filed Weights   05/07/21 0801  Weight: 206 lb 8 oz (93.7 kg)    GENERAL:alert, no distress and comfortable SKIN: skin color, texture, turgor are normal, no rashes or significant lesions EYES: normal, Conjunctiva are pink and non-injected, sclera clear NECK: supple, thyroid normal size, non-tender, without nodularity LUNGS: clear to auscultation and percussion with normal breathing effort HEART: regular rate & rhythm and no murmurs and no lower extremity edema ABDOMEN:abdomen soft, non-tender and normal bowel sounds Musculoskeletal:no cyanosis of digits and no clubbing  NEURO: alert & oriented x 3 with fluent speech, no focal motor/sensory deficits  LABORATORY DATA:  I have reviewed the data as listed CBC Latest Ref Rng & Units 05/07/2021 04/30/2021 04/23/2021  WBC 4.0 - 10.5 K/uL 4.1 3.8(L) 3.6(L)  Hemoglobin 12.0 - 15.0 g/dL 11.9(L) 11.8(L) 12.1  Hematocrit 36.0 - 46.0 % 35.4(L) 35.0(L) 35.7(L)   Platelets 150 - 400 K/uL 271 274 251     CMP Latest Ref Rng & Units 05/07/2021 04/30/2021 04/23/2021  Glucose 70 - 99 mg/dL 104(H) 106(H) 84  BUN 8 - 23 mg/dL 14 14 15   Creatinine 0.44 - 1.00 mg/dL 0.82 0.78 0.85  Sodium 135 - 145 mmol/L 142 144 144  Potassium 3.5 - 5.1 mmol/L 4.4 3.8 3.8  Chloride 98 - 111 mmol/L 107 108 107  CO2 22 - 32 mmol/L 25 26 27   Calcium 8.9 - 10.3 mg/dL 9.3 9.6 9.6  Total Protein 6.5 - 8.1 g/dL 6.9 6.8 6.8  Total Bilirubin 0.3 - 1.2 mg/dL 0.4 0.4 0.5  Alkaline Phos 38 - 126 U/L 90 92 90  AST 15 - 41 U/L 34 23 25  ALT 0 - 44 U/L 36 26 28      RADIOGRAPHIC STUDIES: I have personally reviewed the radiological images as listed and agreed with the findings in the report. No results found.   ASSESSMENT & PLAN:  Aimee Loose Woodin is a 65 y.o. female with   1.Malignant neoplasm of upper-inner quadrant of left breast, cT1cN1M0stage Ib, ER+/PR+/HER2+,pT2N0 stage IA, grade 3  -Diagnosed 12/2020 s/p left lumpectomy and SLNB with Port-A-Cath placement 02/13/2021 by Dr. Barry Dienes -Adjuvant therapy with weekly Taxol and Herceptin followed by maintenance Herceptin every 3 weeks to complete 1 year was recommended to reduce her risk of recurrence. Given her tumor<3 cm and negative lymph node, I did not recommend more intensive chemo. -Staging work-up showed small indeterminate lung nodules and an indeterminate left parietal bone lesion which will monitor with future scans, otherwise negative for distant metastasis -She started weekly taxol/herceptin on 03/18/21, developed possible taxol reaction after C2 with flushing and chest heaviness at home, resolved. No intervention needed. Switched to abraxane with C3, tolerating much better, still has occasional muscular pain  -Her treatment plan includes adjuvant radiation after chemotherapy, and antiestrogen to follow.  -she is clinically doing well, lab reviewed, adequate for treatment, will proceed with C8 chemo today -She has  mild tingling of her feet, will monitor her neuropathy closely, to see if she needs dose reduction.  Continue cryotherapy during chemo -f/u in 2 weeks    2.Peripheral neuropathy G2 -She has baseline neuropathy in her feet, etiology unknown -She is not diabetic, B12 level was normal  -No functional deficits -Taking B complex vitamin -She adheres tocryotherapy during chemoinfusions -We will monitor closely on chemo.overall stable  3.Hot flashes -She was previously on HRT which she stopped with cancer diagnosis in 12/2020 -Hot flashes are mild and tolerable at this time -We discussed SSRI versus gabapentin, she declined for now but may be interested if she has increased hot flashes on antiestrogen therapy  4.small indeterminate lung nodules and an indeterminate left parietal bone lesionon scan  -will follow up with repeated brain MRI in 3 months and probably CT chest in 6 months   5. Scalp bone lesion on MRI 01/2021 -will repeat scan after she completes chemo   6. Dry cough -Likely related to acid reflux -She takes Toms, I recommend her to try PPI.  She previously did not tolerate pepcid.  She will try Prilosec over-the-counter first  PLAN:  -lab reviewed, plan to proceed with Abraxane and Trastuzumab and continue weekly -f/u in 2 weeks  -she will try Priolsec for GERD and cough   No problem-specific Assessment & Plan notes found for this encounter.   No orders of the defined types were placed in this encounter.  All questions were answered. The patient knows to call the clinic with any problems, questions or concerns. No barriers to learning was detected. The total time spent in the appointment was 30 minutes.     Truitt Merle, MD 05/07/2021   I, Joslyn Devon, am acting as scribe for Truitt Merle, MD.   I have reviewed the above documentation for accuracy and completeness, and I agree with the above.

## 2021-05-07 ENCOUNTER — Other Ambulatory Visit: Payer: Self-pay

## 2021-05-07 ENCOUNTER — Inpatient Hospital Stay: Payer: BC Managed Care – PPO

## 2021-05-07 ENCOUNTER — Inpatient Hospital Stay (HOSPITAL_BASED_OUTPATIENT_CLINIC_OR_DEPARTMENT_OTHER): Payer: BC Managed Care – PPO | Admitting: Hematology

## 2021-05-07 ENCOUNTER — Encounter: Payer: Self-pay | Admitting: Hematology

## 2021-05-07 ENCOUNTER — Other Ambulatory Visit: Payer: BC Managed Care – PPO

## 2021-05-07 VITALS — BP 135/65 | HR 86 | Temp 97.6°F | Resp 17 | Ht 67.0 in | Wt 206.5 lb

## 2021-05-07 DIAGNOSIS — C50212 Malignant neoplasm of upper-inner quadrant of left female breast: Secondary | ICD-10-CM | POA: Diagnosis not present

## 2021-05-07 DIAGNOSIS — Z170 Estrogen receptor positive status [ER+]: Secondary | ICD-10-CM

## 2021-05-07 DIAGNOSIS — Z95828 Presence of other vascular implants and grafts: Secondary | ICD-10-CM

## 2021-05-07 DIAGNOSIS — C50912 Malignant neoplasm of unspecified site of left female breast: Secondary | ICD-10-CM

## 2021-05-07 LAB — CBC WITH DIFFERENTIAL (CANCER CENTER ONLY)
Abs Immature Granulocytes: 0.05 10*3/uL (ref 0.00–0.07)
Basophils Absolute: 0 10*3/uL (ref 0.0–0.1)
Basophils Relative: 1 %
Eosinophils Absolute: 0.1 10*3/uL (ref 0.0–0.5)
Eosinophils Relative: 1 %
HCT: 35.4 % — ABNORMAL LOW (ref 36.0–46.0)
Hemoglobin: 11.9 g/dL — ABNORMAL LOW (ref 12.0–15.0)
Immature Granulocytes: 1 %
Lymphocytes Relative: 40 %
Lymphs Abs: 1.6 10*3/uL (ref 0.7–4.0)
MCH: 30.1 pg (ref 26.0–34.0)
MCHC: 33.6 g/dL (ref 30.0–36.0)
MCV: 89.6 fL (ref 80.0–100.0)
Monocytes Absolute: 0.3 10*3/uL (ref 0.1–1.0)
Monocytes Relative: 8 %
Neutro Abs: 2 10*3/uL (ref 1.7–7.7)
Neutrophils Relative %: 49 %
Platelet Count: 271 10*3/uL (ref 150–400)
RBC: 3.95 MIL/uL (ref 3.87–5.11)
RDW: 14.5 % (ref 11.5–15.5)
WBC Count: 4.1 10*3/uL (ref 4.0–10.5)
nRBC: 0 % (ref 0.0–0.2)

## 2021-05-07 LAB — CMP (CANCER CENTER ONLY)
ALT: 36 U/L (ref 0–44)
AST: 34 U/L (ref 15–41)
Albumin: 4.1 g/dL (ref 3.5–5.0)
Alkaline Phosphatase: 90 U/L (ref 38–126)
Anion gap: 10 (ref 5–15)
BUN: 14 mg/dL (ref 8–23)
CO2: 25 mmol/L (ref 22–32)
Calcium: 9.3 mg/dL (ref 8.9–10.3)
Chloride: 107 mmol/L (ref 98–111)
Creatinine: 0.82 mg/dL (ref 0.44–1.00)
GFR, Estimated: >60 mL/min (ref >60)
Glucose, Bld: 104 mg/dL — ABNORMAL HIGH (ref 70–99)
Potassium: 4.4 mmol/L (ref 3.5–5.1)
Sodium: 142 mmol/L (ref 135–145)
Total Bilirubin: 0.4 mg/dL (ref 0.3–1.2)
Total Protein: 6.9 g/dL (ref 6.5–8.1)

## 2021-05-07 MED ORDER — ONDANSETRON HCL 8 MG PO TABS
ORAL_TABLET | ORAL | Status: AC
  Filled 2021-05-07: qty 1

## 2021-05-07 MED ORDER — DIPHENHYDRAMINE HCL 50 MG/ML IJ SOLN
INTRAMUSCULAR | Status: AC
  Filled 2021-05-07: qty 1

## 2021-05-07 MED ORDER — TRASTUZUMAB-DKST CHEMO 150 MG IV SOLR
2.0000 mg/kg | Freq: Once | INTRAVENOUS | Status: AC
  Administered 2021-05-07: 189 mg via INTRAVENOUS
  Filled 2021-05-07: qty 9

## 2021-05-07 MED ORDER — PACLITAXEL PROTEIN-BOUND CHEMO INJECTION 100 MG
100.0000 mg/m2 | Freq: Once | INTRAVENOUS | Status: AC
  Administered 2021-05-07: 200 mg via INTRAVENOUS
  Filled 2021-05-07: qty 40

## 2021-05-07 MED ORDER — SODIUM CHLORIDE 0.9% FLUSH
10.0000 mL | INTRAVENOUS | Status: DC | PRN
Start: 2021-05-07 — End: 2021-05-07
  Administered 2021-05-07: 10 mL
  Filled 2021-05-07: qty 10

## 2021-05-07 MED ORDER — ACETAMINOPHEN 325 MG PO TABS
650.0000 mg | ORAL_TABLET | Freq: Once | ORAL | Status: AC
  Administered 2021-05-07: 650 mg via ORAL

## 2021-05-07 MED ORDER — DIPHENHYDRAMINE HCL 50 MG/ML IJ SOLN
25.0000 mg | Freq: Once | INTRAMUSCULAR | Status: AC
  Administered 2021-05-07: 25 mg via INTRAVENOUS

## 2021-05-07 MED ORDER — ONDANSETRON HCL 8 MG PO TABS
8.0000 mg | ORAL_TABLET | Freq: Once | ORAL | Status: AC
  Administered 2021-05-07: 8 mg via ORAL

## 2021-05-07 MED ORDER — ACETAMINOPHEN 325 MG PO TABS
ORAL_TABLET | ORAL | Status: AC
  Filled 2021-05-07: qty 2

## 2021-05-07 MED ORDER — HEPARIN SOD (PORK) LOCK FLUSH 100 UNIT/ML IV SOLN
500.0000 [IU] | Freq: Once | INTRAVENOUS | Status: AC | PRN
  Administered 2021-05-07: 500 [IU]
  Filled 2021-05-07: qty 5

## 2021-05-07 MED ORDER — SODIUM CHLORIDE 0.9% FLUSH
10.0000 mL | Freq: Once | INTRAVENOUS | Status: AC
  Administered 2021-05-07: 10 mL
  Filled 2021-05-07: qty 10

## 2021-05-07 MED ORDER — SODIUM CHLORIDE 0.9 % IV SOLN
Freq: Once | INTRAVENOUS | Status: AC
Start: 2021-05-07 — End: 2021-05-07
  Filled 2021-05-07: qty 250

## 2021-05-07 NOTE — Patient Instructions (Signed)
[facility]COL[facility]    Discharge Instructions: Thank you for choosing [facility] to provide your oncology and hematology care.   If you have a lab appointment with the [facility], please go directly to the [facility] and check in at the registration area.   Wear comfortable clothing and clothing appropriate for easy access to any Portacath or PICC line.   We strive to give you quality time with your provider. You may need to reschedule your appointment if you arrive late (15 or more minutes).  Arriving late affects you and other patients whose appointments are after yours.  Also, if you miss three or more appointments without notifying the office, you may be dismissed from the clinic at the provider's discretion.      For prescription refill requests, have your pharmacy contact our office and allow 72 hours for refills to be completed.    Today you received the following chemotherapy and/or immunotherapy agents: paclitaxel-protein bound and trastuzumab.      To help prevent nausea and vomiting after your treatment, we encourage you to take your nausea medication as directed.  BELOW ARE SYMPTOMS THAT SHOULD BE REPORTED IMMEDIATELY: . *FEVER GREATER THAN 100.4 F (38 C) OR HIGHER . *CHILLS OR SWEATING . *NAUSEA AND VOMITING THAT IS NOT CONTROLLED WITH YOUR NAUSEA MEDICATION . *UNUSUAL SHORTNESS OF BREATH . *UNUSUAL BRUISING OR BLEEDING . *URINARY PROBLEMS (pain or burning when urinating, or frequent urination) . *BOWEL PROBLEMS (unusual diarrhea, constipation, pain near the anus) . TENDERNESS IN MOUTH AND THROAT WITH OR WITHOUT PRESENCE OF ULCERS (sore throat, sores in mouth, or a toothache) . UNUSUAL RASH, SWELLING OR PAIN  . UNUSUAL VAGINAL DISCHARGE OR ITCHING   Items with * indicate a potential emergency and should be followed up as soon as possible or go to the Emergency Department if any problems should occur.  Please show the  CHEMOTHERAPY ALERT CARD or IMMUNOTHERAPY ALERT CARD at check-in to the Emergency Department and triage nurse.  Should you have questions after your visit or need to cancel or reschedule your appointment, please contact [facility]  Dept: [phone]  and follow the prompts.  Office hours are 8:00 a.m. to 4:30 p.m. Monday - Friday. Please note that voicemails left after 4:00 p.m. may not be returned until the following business day.  We are closed weekends and major holidays. You have access to a nurse at all times for urgent questions. Please call the main number to the clinic Dept: [phone] and follow the prompts.   For any non-urgent questions, you may also contact your provider using MyChart. We now offer e-Visits for anyone 53 and older to request care online for non-urgent symptoms. For details visit [email or web address].   Also download the MyChart app! Go to the app store, search "MyChart", open the app, select [facility], and log in with your MyChart username and password.  Due to Covid, a mask is required upon entering the hospital/clinic. If you do not have a mask, one will be given to you upon arrival. For doctor visits, patients may have 1 support person aged 63 or older with them. For treatment visits, patients cannot have anyone with them due to current Covid guidelines and our immunocompromised population.

## 2021-05-12 ENCOUNTER — Encounter: Payer: Self-pay | Admitting: Hematology

## 2021-05-13 ENCOUNTER — Encounter: Payer: Self-pay | Admitting: Hematology

## 2021-05-14 ENCOUNTER — Inpatient Hospital Stay: Payer: Medicare Other | Attending: Hematology

## 2021-05-14 ENCOUNTER — Encounter: Payer: Self-pay | Admitting: Hematology

## 2021-05-14 ENCOUNTER — Other Ambulatory Visit: Payer: BC Managed Care – PPO

## 2021-05-14 ENCOUNTER — Inpatient Hospital Stay: Payer: Medicare Other

## 2021-05-14 ENCOUNTER — Other Ambulatory Visit: Payer: Self-pay

## 2021-05-14 ENCOUNTER — Other Ambulatory Visit: Payer: Self-pay | Admitting: Pharmacist

## 2021-05-14 VITALS — BP 149/82 | HR 82 | Temp 98.5°F | Resp 17 | Wt 206.8 lb

## 2021-05-14 DIAGNOSIS — G629 Polyneuropathy, unspecified: Secondary | ICD-10-CM | POA: Diagnosis not present

## 2021-05-14 DIAGNOSIS — N951 Menopausal and female climacteric states: Secondary | ICD-10-CM | POA: Insufficient documentation

## 2021-05-14 DIAGNOSIS — C50212 Malignant neoplasm of upper-inner quadrant of left female breast: Secondary | ICD-10-CM | POA: Insufficient documentation

## 2021-05-14 DIAGNOSIS — C50912 Malignant neoplasm of unspecified site of left female breast: Secondary | ICD-10-CM

## 2021-05-14 DIAGNOSIS — Z5111 Encounter for antineoplastic chemotherapy: Secondary | ICD-10-CM | POA: Diagnosis present

## 2021-05-14 DIAGNOSIS — Z170 Estrogen receptor positive status [ER+]: Secondary | ICD-10-CM | POA: Insufficient documentation

## 2021-05-14 DIAGNOSIS — R918 Other nonspecific abnormal finding of lung field: Secondary | ICD-10-CM | POA: Insufficient documentation

## 2021-05-14 DIAGNOSIS — Z79899 Other long term (current) drug therapy: Secondary | ICD-10-CM | POA: Insufficient documentation

## 2021-05-14 DIAGNOSIS — Z95828 Presence of other vascular implants and grafts: Secondary | ICD-10-CM

## 2021-05-14 DIAGNOSIS — R059 Cough, unspecified: Secondary | ICD-10-CM | POA: Insufficient documentation

## 2021-05-14 LAB — CBC WITH DIFFERENTIAL (CANCER CENTER ONLY)
Abs Immature Granulocytes: 0.06 10*3/uL (ref 0.00–0.07)
Basophils Absolute: 0.1 10*3/uL (ref 0.0–0.1)
Basophils Relative: 1 %
Eosinophils Absolute: 0.1 10*3/uL (ref 0.0–0.5)
Eosinophils Relative: 1 %
HCT: 33.8 % — ABNORMAL LOW (ref 36.0–46.0)
Hemoglobin: 11.8 g/dL — ABNORMAL LOW (ref 12.0–15.0)
Immature Granulocytes: 1 %
Lymphocytes Relative: 35 %
Lymphs Abs: 1.6 10*3/uL (ref 0.7–4.0)
MCH: 30.6 pg (ref 26.0–34.0)
MCHC: 34.9 g/dL (ref 30.0–36.0)
MCV: 87.6 fL (ref 80.0–100.0)
Monocytes Absolute: 0.4 10*3/uL (ref 0.1–1.0)
Monocytes Relative: 8 %
Neutro Abs: 2.4 10*3/uL (ref 1.7–7.7)
Neutrophils Relative %: 54 %
Platelet Count: 268 10*3/uL (ref 150–400)
RBC: 3.86 MIL/uL — ABNORMAL LOW (ref 3.87–5.11)
RDW: 15.5 % (ref 11.5–15.5)
WBC Count: 4.6 10*3/uL (ref 4.0–10.5)
nRBC: 0 % (ref 0.0–0.2)

## 2021-05-14 LAB — CMP (CANCER CENTER ONLY)
ALT: 32 U/L (ref 0–44)
AST: 27 U/L (ref 15–41)
Albumin: 4.1 g/dL (ref 3.5–5.0)
Alkaline Phosphatase: 89 U/L (ref 38–126)
Anion gap: 12 (ref 5–15)
BUN: 13 mg/dL (ref 8–23)
CO2: 24 mmol/L (ref 22–32)
Calcium: 9.5 mg/dL (ref 8.9–10.3)
Chloride: 107 mmol/L (ref 98–111)
Creatinine: 0.8 mg/dL (ref 0.44–1.00)
GFR, Estimated: >60 mL/min (ref >60)
Glucose, Bld: 95 mg/dL (ref 70–99)
Potassium: 3.9 mmol/L (ref 3.5–5.1)
Sodium: 143 mmol/L (ref 135–145)
Total Bilirubin: 0.4 mg/dL (ref 0.3–1.2)
Total Protein: 6.9 g/dL (ref 6.5–8.1)

## 2021-05-14 MED ORDER — SODIUM CHLORIDE 0.9% FLUSH
10.0000 mL | INTRAVENOUS | Status: DC | PRN
  Filled 2021-05-14: qty 10

## 2021-05-14 MED ORDER — SODIUM CHLORIDE 0.9 % IV SOLN
Freq: Once | INTRAVENOUS | Status: AC
  Filled 2021-05-14: qty 250

## 2021-05-14 MED ORDER — ACETAMINOPHEN 325 MG PO TABS
650.0000 mg | ORAL_TABLET | Freq: Once | ORAL | Status: AC
  Administered 2021-05-14: 650 mg via ORAL

## 2021-05-14 MED ORDER — PACLITAXEL PROTEIN-BOUND CHEMO INJECTION 100 MG
100.0000 mg/m2 | Freq: Once | INTRAVENOUS | Status: AC
  Administered 2021-05-14: 200 mg via INTRAVENOUS
  Filled 2021-05-14: qty 40

## 2021-05-14 MED ORDER — DIPHENHYDRAMINE HCL 50 MG/ML IJ SOLN
INTRAMUSCULAR | Status: AC
  Filled 2021-05-14: qty 1

## 2021-05-14 MED ORDER — SODIUM CHLORIDE 0.9% FLUSH
10.0000 mL | Freq: Once | INTRAVENOUS | Status: AC
  Administered 2021-05-14: 10 mL
  Filled 2021-05-14: qty 10

## 2021-05-14 MED ORDER — ONDANSETRON HCL 8 MG PO TABS
8.0000 mg | ORAL_TABLET | Freq: Once | ORAL | Status: AC
  Administered 2021-05-14: 8 mg via ORAL

## 2021-05-14 MED ORDER — ONDANSETRON HCL 8 MG PO TABS
ORAL_TABLET | ORAL | Status: AC
  Filled 2021-05-14: qty 1

## 2021-05-14 MED ORDER — DIPHENHYDRAMINE HCL 50 MG/ML IJ SOLN
25.0000 mg | Freq: Once | INTRAMUSCULAR | Status: AC
  Administered 2021-05-14: 25 mg via INTRAVENOUS

## 2021-05-14 MED ORDER — HEPARIN SOD (PORK) LOCK FLUSH 100 UNIT/ML IV SOLN
500.0000 [IU] | Freq: Once | INTRAVENOUS | Status: DC | PRN
  Filled 2021-05-14: qty 5

## 2021-05-14 MED ORDER — ACETAMINOPHEN 325 MG PO TABS
ORAL_TABLET | ORAL | Status: AC
  Filled 2021-05-14: qty 2

## 2021-05-14 MED ORDER — TRASTUZUMAB-DKST CHEMO 150 MG IV SOLR
2.0000 mg/kg | Freq: Once | INTRAVENOUS | Status: AC
  Administered 2021-05-14: 189 mg via INTRAVENOUS
  Filled 2021-05-14: qty 9

## 2021-05-15 ENCOUNTER — Encounter: Payer: Self-pay | Admitting: Hematology

## 2021-05-19 ENCOUNTER — Other Ambulatory Visit: Payer: Self-pay

## 2021-05-19 ENCOUNTER — Ambulatory Visit (INDEPENDENT_AMBULATORY_CARE_PROVIDER_SITE_OTHER): Payer: Medicare Other | Admitting: Family Medicine

## 2021-05-19 ENCOUNTER — Encounter: Payer: Self-pay | Admitting: Hematology

## 2021-05-19 ENCOUNTER — Encounter: Payer: Self-pay | Admitting: Family Medicine

## 2021-05-19 ENCOUNTER — Encounter: Payer: Self-pay | Admitting: *Deleted

## 2021-05-19 VITALS — BP 142/80 | HR 87 | Temp 97.3°F | Ht 67.0 in | Wt 207.0 lb

## 2021-05-19 DIAGNOSIS — Z0000 Encounter for general adult medical examination without abnormal findings: Secondary | ICD-10-CM | POA: Diagnosis not present

## 2021-05-19 DIAGNOSIS — Z170 Estrogen receptor positive status [ER+]: Secondary | ICD-10-CM

## 2021-05-19 DIAGNOSIS — Z7189 Other specified counseling: Secondary | ICD-10-CM

## 2021-05-19 NOTE — Patient Instructions (Signed)
Let me check about follow up imaging scheduling/timing.   Take care.  Glad to see you.

## 2021-05-19 NOTE — Progress Notes (Addendum)
This visit occurred during the SARS-CoV-2 public health emergency.  Safety protocols were in place, including screening questions prior to the visit, additional usage of staff PPE, and extensive cleaning of exam room while observing appropriate contact time as indicated for disinfecting solutions.  I have personally reviewed the Medicare Annual Wellness questionnaire and have noted 1. The patient's medical and social history 2. Their use of alcohol, tobacco or illicit drugs 3. Their current medications and supplements 4. The patient's functional ability including ADL's, fall risks, home safety risks and hearing or visual             impairment. 5. Diet and physical activities 6. Evidence for depression or mood disorders  The patients weight, height, BMI have been recorded in the chart. I have made referrals, counseling and provided education to the patient based review of the above and I have provided the pt with a written personalized care plan for preventive services.  Provider list updated- see scanned forms.  Routine anticipatory guidance given to patient.  See health maintenance. The possibility exists that previously documented standard health maintenance information may have been brought forward from a previous encounter into this note.  If needed, that same information has been updated to reflect the current situation based on today's encounter.    Flu yearly Shingles deferred given ongoing treatment. PNA deferred Tetanus deferred COVID-vaccine up-to-date. Colonoscopy 2020 Mammogram up-to-date. Bone density deferred given recent events. Advance directive-husband designated if patient were incapacitated. Cognitive function addressed- see scanned forms- and if abnormal then additional documentation follows.  EKG in chart. Hearing grossly intact bilaterally. Vision intact bilaterally.  She had ongoing chemo after lumpectomy with plan for radiation thereafter.  She had B foot  neuropathy, foot care d/w pt.  D/w pt about onc plans for f/u imaging.  No recent nausea.   She had episodic SOB attributed to chemo tx.  No sputum.  Some cough exacerbated by GERD, taking tums for that.  Rare days she had brain fog.  PMH and SH reviewed  Meds, vitals, and allergies reviewed.   ROS: Per HPI.  Unless specifically indicated otherwise in HPI, the patient denies:  General: fever. Eyes: acute vision changes ENT: sore throat Cardiovascular: chest pain Respiratory: SOB GI: vomiting GU: dysuria Musculoskeletal: acute back pain Derm: acute rash Neuro: acute motor dysfunction Psych: worsening mood Endocrine: polydipsia Heme: bleeding Allergy: hayfever  GEN: nad, alert and oriented HEENT: NCAT NECK: supple w/o LA CV: rrr. PULM: ctab, no inc wob ABD: soft, +bs EXT: no edema SKIN: Well-perfused.

## 2021-05-21 ENCOUNTER — Other Ambulatory Visit: Payer: BC Managed Care – PPO

## 2021-05-21 ENCOUNTER — Inpatient Hospital Stay: Payer: Medicare Other

## 2021-05-21 ENCOUNTER — Other Ambulatory Visit: Payer: Self-pay

## 2021-05-21 ENCOUNTER — Ambulatory Visit (HOSPITAL_COMMUNITY)
Admission: RE | Admit: 2021-05-21 | Discharge: 2021-05-21 | Disposition: A | Discharge status: Home / Self Care | Payer: Medicare Other | Source: Ambulatory Visit | Attending: Nurse Practitioner | Admitting: Nurse Practitioner

## 2021-05-21 ENCOUNTER — Encounter: Payer: Self-pay | Admitting: Nurse Practitioner

## 2021-05-21 ENCOUNTER — Inpatient Hospital Stay (HOSPITAL_BASED_OUTPATIENT_CLINIC_OR_DEPARTMENT_OTHER): Payer: Medicare Other | Admitting: Nurse Practitioner

## 2021-05-21 ENCOUNTER — Encounter: Payer: Self-pay | Admitting: Hematology

## 2021-05-21 VITALS — BP 151/91 | HR 73 | Temp 98.2°F | Resp 17 | Ht 67.0 in | Wt 207.9 lb

## 2021-05-21 DIAGNOSIS — Z170 Estrogen receptor positive status [ER+]: Secondary | ICD-10-CM | POA: Diagnosis present

## 2021-05-21 DIAGNOSIS — C50212 Malignant neoplasm of upper-inner quadrant of left female breast: Secondary | ICD-10-CM | POA: Diagnosis present

## 2021-05-21 DIAGNOSIS — C50912 Malignant neoplasm of unspecified site of left female breast: Secondary | ICD-10-CM

## 2021-05-21 DIAGNOSIS — Z0000 Encounter for general adult medical examination without abnormal findings: Secondary | ICD-10-CM | POA: Insufficient documentation

## 2021-05-21 DIAGNOSIS — Z95828 Presence of other vascular implants and grafts: Secondary | ICD-10-CM

## 2021-05-21 LAB — CMP (CANCER CENTER ONLY)
ALT: 30 U/L (ref 0–44)
AST: 25 U/L (ref 15–41)
Albumin: 4.1 g/dL (ref 3.5–5.0)
Alkaline Phosphatase: 94 U/L (ref 38–126)
Anion gap: 10 (ref 5–15)
BUN: 14 mg/dL (ref 8–23)
CO2: 26 mmol/L (ref 22–32)
Calcium: 9.6 mg/dL (ref 8.9–10.3)
Chloride: 109 mmol/L (ref 98–111)
Creatinine: 0.76 mg/dL (ref 0.44–1.00)
GFR, Estimated: >60 mL/min (ref >60)
Glucose, Bld: 94 mg/dL (ref 70–99)
Potassium: 4.4 mmol/L (ref 3.5–5.1)
Sodium: 145 mmol/L (ref 135–145)
Total Bilirubin: 0.3 mg/dL (ref 0.3–1.2)
Total Protein: 7 g/dL (ref 6.5–8.1)

## 2021-05-21 LAB — CBC WITH DIFFERENTIAL (CANCER CENTER ONLY)
Abs Immature Granulocytes: 0.08 10*3/uL — ABNORMAL HIGH (ref 0.00–0.07)
Basophils Absolute: 0.1 10*3/uL (ref 0.0–0.1)
Basophils Relative: 1 %
Eosinophils Absolute: 0.1 10*3/uL (ref 0.0–0.5)
Eosinophils Relative: 2 %
HCT: 33.6 % — ABNORMAL LOW (ref 36.0–46.0)
Hemoglobin: 11.5 g/dL — ABNORMAL LOW (ref 12.0–15.0)
Immature Granulocytes: 2 %
Lymphocytes Relative: 36 %
Lymphs Abs: 1.8 10*3/uL (ref 0.7–4.0)
MCH: 30.7 pg (ref 26.0–34.0)
MCHC: 34.2 g/dL (ref 30.0–36.0)
MCV: 89.8 fL (ref 80.0–100.0)
Monocytes Absolute: 0.4 10*3/uL (ref 0.1–1.0)
Monocytes Relative: 9 %
Neutro Abs: 2.5 10*3/uL (ref 1.7–7.7)
Neutrophils Relative %: 50 %
Platelet Count: 298 10*3/uL (ref 150–400)
RBC: 3.74 MIL/uL — ABNORMAL LOW (ref 3.87–5.11)
RDW: 16 % — ABNORMAL HIGH (ref 11.5–15.5)
WBC Count: 5 10*3/uL (ref 4.0–10.5)
nRBC: 0 % (ref 0.0–0.2)

## 2021-05-21 MED ORDER — SODIUM CHLORIDE 0.9 % IV SOLN
Freq: Once | INTRAVENOUS | Status: AC
  Filled 2021-05-21: qty 250

## 2021-05-21 MED ORDER — DIPHENHYDRAMINE HCL 50 MG/ML IJ SOLN
25.0000 mg | Freq: Once | INTRAMUSCULAR | Status: AC
  Administered 2021-05-21: 25 mg via INTRAVENOUS

## 2021-05-21 MED ORDER — PACLITAXEL PROTEIN-BOUND CHEMO INJECTION 100 MG
100.0000 mg/m2 | Freq: Once | INTRAVENOUS | Status: AC
  Administered 2021-05-21: 200 mg via INTRAVENOUS
  Filled 2021-05-21: qty 40

## 2021-05-21 MED ORDER — SODIUM CHLORIDE 0.9 % IV SOLN
189.0000 mg | Freq: Once | INTRAVENOUS | Status: AC
Start: 2021-05-21 — End: 2021-05-21
  Administered 2021-05-21: 189 mg via INTRAVENOUS
  Filled 2021-05-21: qty 9

## 2021-05-21 MED ORDER — ACETAMINOPHEN 325 MG PO TABS
ORAL_TABLET | ORAL | Status: AC
  Filled 2021-05-21: qty 2

## 2021-05-21 MED ORDER — SODIUM CHLORIDE 0.9% FLUSH
10.0000 mL | Freq: Once | INTRAVENOUS | Status: AC
  Administered 2021-05-21: 10 mL
  Filled 2021-05-21: qty 10

## 2021-05-21 MED ORDER — DIPHENHYDRAMINE HCL 50 MG/ML IJ SOLN
INTRAMUSCULAR | Status: AC
  Filled 2021-05-21: qty 1

## 2021-05-21 MED ORDER — HEPARIN SOD (PORK) LOCK FLUSH 100 UNIT/ML IV SOLN
500.0000 [IU] | Freq: Once | INTRAVENOUS | Status: AC | PRN
Start: 2021-05-21 — End: 2021-05-21
  Administered 2021-05-21: 500 [IU]
  Filled 2021-05-21: qty 5

## 2021-05-21 MED ORDER — SODIUM CHLORIDE 0.9% FLUSH
10.0000 mL | INTRAVENOUS | Status: DC | PRN
  Administered 2021-05-21: 10 mL
  Filled 2021-05-21: qty 10

## 2021-05-21 MED ORDER — ONDANSETRON HCL 8 MG PO TABS
8.0000 mg | ORAL_TABLET | Freq: Once | ORAL | Status: AC
  Administered 2021-05-21: 8 mg via ORAL

## 2021-05-21 MED ORDER — ACETAMINOPHEN 325 MG PO TABS
650.0000 mg | ORAL_TABLET | Freq: Once | ORAL | Status: AC
  Administered 2021-05-21: 650 mg via ORAL

## 2021-05-21 MED ORDER — ONDANSETRON HCL 8 MG PO TABS
ORAL_TABLET | ORAL | Status: AC
  Filled 2021-05-21: qty 1

## 2021-05-21 NOTE — Progress Notes (Signed)
[place]   [phone] [phone]   Clinic Follow up Note   Patient Care Team: [name], MD as PCP - General (Family Medicine) Rockwell Germany, RN as Oncology Nurse Navigator Mauro Kaufmann, RN as Oncology Nurse Navigator Stark Klein, MD as Consulting Physician (General Surgery) Truitt Merle, MD as Consulting Physician (Hematology) Eppie Gibson, MD as Attending Physician (Radiation Oncology) 05/21/2021  CHIEF COMPLAINT: Follow-up left breast cancer  SUMMARY OF ONCOLOGIC HISTORY: Oncology History Overview Note  Cancer Staging Malignant neoplasm of upper-inner quadrant of left breast in female, estrogen receptor positive ([place]) Staging form: Breast, AJCC 8th Edition - Clinical stage from 01/14/2020: Stage IB (cT1c, cN1, cM0, G3, ER+, PR+, HER2+) - Signed by Truitt Merle, MD on 01/21/2021 Stage prefix: Initial diagnosis    Malignant neoplasm of upper-inner quadrant of left breast in female, estrogen receptor positive ([place])  01/14/2020 Cancer Staging   Staging form: Breast, AJCC 8th Edition - Clinical stage from 01/14/2020: Stage IB (cT1c, cN1, cM0, G3, ER+, PR+, HER2+) - Signed by Truitt Merle, MD on 01/21/2021 Stage prefix: Initial diagnosis   01/02/2021 Mammogram   Left breast with 1.9x1x1.8 cm complex cyst at 12:00 position, 4cmfn, versus saloid mass with calcifications, irregulr, increased in size. Biopsy recommended.    01/13/2021 Initial Biopsy   Diagnosis 1. Breast, left, needle core biopsy, 11 o'clock, 4cmfn - INVASIVE DUCTAL CARCINOMA - SEE COMMENT 2. Lymph node, needle/core biopsy, left axilla - FIBROVASCULAR AND ADIPOSE TISSUE - NO LYMPHOID TISSUE OR CARCINOMA IDENTIFIED Microscopic Comment 1. Based on the biopsy, the carcinoma appears Nottingham grade 3 of 3 and measures 1.2 cm in greatest linear extent. Prognostic markers (ER/PR/ki-67/HER2) are pending and will be reported in an addendum. Dr. Jeannie Done reviewed the case and agrees  with the above diagnosis. These results were called to [facility] on January 14, 2021   01/13/2021 Receptors her2   1. PROGNOSTIC INDICATORS Results: IMMUNOHISTOCHEMICAL AND MORPHOMETRIC ANALYSIS PERFORMED MANUALLY The tumor cells are POSITIVE for Her2 (3+). Estrogen Receptor: 95%, POSITIVE, STRONG STAINING INTENSITY Progesterone Receptor: 85%, POSITIVE, STRONG STAINING INTENSITY Proliferation Marker Ki67: 30%   01/16/2021 Initial Diagnosis   Malignant neoplasm of upper-inner quadrant of left breast in female, estrogen receptor positive ([place])   01/23/2021 Imaging   MRI Breast  IMPRESSION: 1. Known malignancy measuring 2.9 centimeters in the UPPER INNER QUADRANT of the LEFT breast. There is no evidence for involvement of the pectoralis muscle or implant. 2. No evidence for lymphadenopathy. 3. RIGHT breast is negative. 4. Bilateral retropectoral saline implants.   02/09/2021 Imaging   MRI Brain IMPRESSION: No evidence of intracranial metastatic disease.   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     02/13/2021 Surgery   LEFT BREAST LUMPECTOMY WITH RADIOACTIVE SEED AND SENTINEL LYMPH NODE BIOPSY WITH SEED TARGETED NODE and PAC Placed by Dr Barry Dienes   02/13/2021 Pathology Results   FINAL MICROSCOPIC DIAGNOSIS:   A. BREAST, LEFT, LUMPECTOMY:  - Invasive ductal carcinoma, 2.6 cm, Nottingham grade 3 of 3.  - Ductal carcinoma in situ, high grade with central necrosis.  - Margins of resection:       - Invasive carcinoma broadly involves the anterior and inferior  margins.  Invasive carcinoma is focally < 1 mm from each the superior  and medial margins.       - DCIS is focally < 1 mm from the inferior margin.  DCIS is focally  1 mm from each the anterior and superior margins.  DCIS is  focally 1-2  mm from the medial margin.  - Biopsy clip.  - See oncology table.   B. BREAST, LEFT, ANTERIOR TISSUE, EXCISION:  - Residual invasive and in-situ ductal carcinoma.  - Residual  invasive ductal carcinoma broadly involves the posterior  margin.  - Residual DCIS is focally 1 mm from the posterior margin.   C. BREAST, LEFT, INFERIOR MARGIN, EXCISION:  - No carcinoma identified.   D. SENTINEL LYMPH NODE, LEFT AXILLARY #1, BIOPSY:  - No carcinoma identified in one lymph node (0/1).   E. SENTINEL LYMPH NODE, LEFT AXILLARY #2, BIOPSY:  - Biopsy site.  - No distinct nodal tissue identified.  - No carcinoma identified.   F. SENTINEL LYMPH NODE, LEFT AXILLARY #3, BIOPSY:  - No carcinoma identified in one lymph node (0/1).   G. SENTINEL LYMPH NODE, LEFT AXILLARY #4, BIOPSY:  - No carcinoma identified in one lymph node (0/1).    02/13/2021 Cancer Staging   Staging form: Breast, AJCC 8th Edition - Pathologic stage from 02/13/2021: Stage IA (pT2, pN0, cM0, G3, ER+, PR+, HER2+) - Signed by Heilingoetter, Cassandra L, PA-C on 03/07/2021 Stage prefix: Initial diagnosis Nuclear grade: G3 Histologic grading system: 3 grade system   03/18/2021 -  Chemotherapy    Patient is on Treatment Plan: BREAST PACLITAXEL + TRASTUZUMAB Q7D / TRASTUZUMAB Q21D        CURRENT THERAPY: Adjuvant chemo weekly taxol and herceptin q12 weeks followed by maintenance herceptin q3 weeks to complete 1 year,starting 03/18/2021.  Taxol changed to Abraxane with cycle 3 due to possible reaction, tolerating well  INTERVAL HISTORY: Mr. Siebels returns for follow-up and treatment as scheduled.  She continues weekly Abraxane and Herceptin.  She has more fatigue since last cycle, but remains functional and continues working.  Eating and drinking well, denies n/v/c/d.  She continues to have reflux with more cough and occasional low chest heaviness/discomfort.  She has not switched to Prilosec yet, she did not know she could continue times with it.  She has mild exertional dyspnea.  The mild chest discomfort and dyspnea resolve when she becomes active.  Denies fever or chills.  Neuropathy is mostly stable.  She has  mild swelling in her ankles, she ate shrimp recently which normally contributes to some swelling.  Denies calf pain.   MEDICAL HISTORY:  Past Medical History:  Diagnosis Date  . Breast cancer ([place])   . Cataract   . Colon polyps   . Endometriosis   . Family history of colon cancer   . Family history of colonic polyps   . Fuchs' corneal dystrophy   . GERD (gastroesophageal reflux disease)   . H/O bilateral breast implants   . History of colon polyps   . HPV in female   . HSV (herpes simplex virus) infection   . Migraine   . S/P TAH (total abdominal hysterectomy) 1991    SURGICAL HISTORY: Past Surgical History:  Procedure Laterality Date  . ABDOMINAL HYSTERECTOMY    . BREAST BIOPSY     Benign.  Marland Kitchen BREAST ENHANCEMENT SURGERY    . BREAST LUMPECTOMY Bilateral 1993 and 1995  . BREAST LUMPECTOMY WITH RADIOACTIVE SEED AND SENTINEL LYMPH NODE BIOPSY Left 02/13/2021   Procedure: LEFT BREAST LUMPECTOMY WITH RADIOACTIVE SEED AND SENTINEL LYMPH NODE BIOPSY WITH SEED TARGETED NODE;  Surgeon: Stark Klein, MD;  Location: [place];  Service: General;  Laterality: Left;  . CATARACT EXTRACTION, BILATERAL    . CESAREAN SECTION    . PARTIAL  HYSTERECTOMY    . PORTACATH PLACEMENT Right 02/13/2021   Procedure: INSERTION PORT-A-CATH;  Surgeon: Stark Klein, MD;  Location: [place];  Service: General;  Laterality: Right;  . TONSILLECTOMY    . WISDOM TOOTH EXTRACTION      I have reviewed the social history and family history with the patient and they are unchanged from previous note.  ALLERGIES:  is allergic to ibuprofen, omnicef [cefdinir], proton pump inhibitors, roxicodone [oxycodone hcl], and vioxx [rofecoxib].  MEDICATIONS:  Current Outpatient Medications  Medication Sig Dispense Refill  . acetaminophen (TYLENOL) 500 MG tablet Take 1,000 mg by mouth every 6 (six) hours as needed.    . Calcium 600-200 MG-UNIT tablet Take 1 tablet by mouth daily.    . calcium  carbonate (TUMS - DOSED IN MG ELEMENTAL CALCIUM) 500 MG chewable tablet Chew 1 tablet by mouth as needed for indigestion or heartburn. Takes 5-6 per day    . lidocaine-prilocaine (EMLA) cream Apply 1 application topically as needed. 30 g 2  . Multiple Vitamin (MULTIVITAMIN) tablet Take 1 tablet by mouth daily.    . ondansetron (ZOFRAN) 8 MG tablet Take 1 tablet (8 mg total) by mouth every 8 (eight) hours as needed for nausea or vomiting. 30 tablet 2  . prochlorperazine (COMPAZINE) 10 MG tablet Take 1 tablet (10 mg total) by mouth every 6 (six) hours as needed. 30 tablet 2  . traMADol (ULTRAM) 50 MG tablet Take 1 tablet (50 mg total) by mouth every 6 (six) hours as needed. 10 tablet 0  . valACYclovir (VALTREX) 500 MG tablet Take 1 tablet (500 mg total) by mouth 2 (two) times daily as needed.    . Vitamin B Complex-C CAPS Take 1 capsule by mouth.     No current facility-administered medications for this visit.   Facility-Administered Medications Ordered in Other Visits  Medication Dose Route Frequency Provider Last Rate Last Admin  . heparin lock flush 100 unit/mL  500 Units Intracatheter Once PRN Truitt Merle, MD      . sodium chloride flush (NS) 0.9 % injection 10 mL  10 mL Intracatheter PRN Truitt Merle, MD        PHYSICAL EXAMINATION: ECOG PERFORMANCE STATUS: 1 - Symptomatic but completely ambulatory  Vitals:   05/21/21 1040  BP: (!) 151/91  Pulse: 73  Resp: 17  Temp: 98.2 F (36.8 C)  SpO2: 100%   Filed Weights   05/21/21 1040  Weight: 207 lb 14.4 oz (94.3 kg)    GENERAL:alert, no distress and comfortable SKIN: no rash  EYES: sclera clear LUNGS: clear with normal breathing effort HEART: regular rate & rhythm, trace bilateral ankle edema  NEURO: alert & oriented x 3 with fluent speech, no focal motor deficits PAC without erythema  Breast exam deferred   LABORATORY DATA:  I have reviewed the data as listed CBC Latest Ref Rng & Units 05/21/2021 05/14/2021 05/07/2021  WBC 4.0 - 10.5  K/uL 5.0 4.6 4.1  Hemoglobin 12.0 - 15.0 g/dL 11.5(L) 11.8(L) 11.9(L)  Hematocrit 36.0 - 46.0 % 33.6(L) 33.8(L) 35.4(L)  Platelets 150 - 400 K/uL 298 268 271     CMP Latest Ref Rng & Units 05/21/2021 05/14/2021 05/07/2021  Glucose 70 - 99 mg/dL 94 95 104(H)  BUN 8 - 23 mg/dL 14 13 14   Creatinine 0.44 - 1.00 mg/dL 0.76 0.80 0.82  Sodium 135 - 145 mmol/L 145 143 142  Potassium 3.5 - 5.1 mmol/L 4.4 3.9 4.4  Chloride 98 - 111 mmol/L 109  107 107  CO2 22 - 32 mmol/L 26 24 25   Calcium 8.9 - 10.3 mg/dL 9.6 9.5 9.3  Total Protein 6.5 - 8.1 g/dL 7.0 6.9 6.9  Total Bilirubin 0.3 - 1.2 mg/dL 0.3 0.4 0.4  Alkaline Phos 38 - 126 U/L 94 89 90  AST 15 - 41 U/L 25 27 34  ALT 0 - 44 U/L 30 32 36      RADIOGRAPHIC STUDIES: I have personally reviewed the radiological images as listed and agreed with the findings in the report. No results found.   ASSESSMENT & PLAN: 65 year old postmenopausal female  1.Malignant neoplasm of upper-inner quadrant of left breast, cT1cN1M0stage Ib, ER+/PR+/HER2+,pT2N0 stage IA, grade 3  -Diagnosed 12/2020 s/p left lumpectomy and SLNB with Port-A-Cath placement 02/13/2021 by Dr. Barry Dienes -Adjuvant therapy with weekly Taxol and Herceptin followed by maintenance Herceptin every 3 weeks to complete 1 year was recommended to reduce her risk of recurrence. Given her tumor<3 cm and negative lymph node, Dr. Burr Medico did not recommend more intensive chemo. -Staging work-up showed small indeterminate lung nodules and an indeterminate left parietal bone lesion which will monitor with future scans, otherwise negative for distant metastasis -Baseline echo showed EF 60-65 % with GLS -22.4%, will monitor every 3 months on Herceptin -she began weekly taxol/herceptin 03/18/21, developed possible taxol reaction after C2 with flushing and chest heaviness at home, resolved. No intervention needed. Switched to abraxane with C3, tolerating well  -Her treatment plan includes adjuvant radiation after  chemotherapy, and antiestrogen to follow  2.Peripheral neuropathy -She has baseline neuropathy in her feet, etiology unknown -She is not diabetic, normal B12 -No functional deficits -Taking B complex vitamin -She adheres to cryotherapy during chemo infusions -mildly increased on chemo but stable lately, likely will need dose reductions with last 1 or 2 cycles    3.Vasomotor symptoms of menopause:Hot flashes -She was previously on HRT which she stopped with cancer diagnosis in 12/2020 -Hot flashes are mild and tolerable at this time -We discussed SSRI versus gabapentin, she declined for now but may be interested if she has increased hot flashes on antiestrogen therapy -May consider starting gabapentin sooner if she has progressive neuropathy on chemo  4. Cough, chest heaviness/discomfort -she has GERD, did not tolerate pepcid. Tums help -recently her cough worsened, and developed intermittent mild mid chest discomfort. Resolves with activity.  -likely not cardiac. I suspect related to GERD. She was recommended to try prilosec, continue Tums  Disposition: Mr. Gerber appears stable.  S/p 9 cycles of weekly Abraxane and Herceptin.  She tolerates treatment well with fatigue and stable neuropathy.  She is able to recover and function well.  CBC and CMP are stable, adequate to proceed with cycle 10 weekly Abraxane and Herceptin today as planned.  Continue cryotherapy with infusions.  She has worsening cough with occasional chest discomfort and exertional dyspnea.  She was instructed to start Prilosec which she has not done yet, agreeable to do so.  She can continue Tums as needed.  Today's exam is benign.  I suspect her symptoms are related to GERD.  Exertional dyspnea is likely secondary to chemo related fatigue. Symptoms resolve with activity.  Will obtain chest x-ray to r/o pulm etiology, and given she is on herceptin, will repeat echo to r/o HF.  She will follow-up in 1 and 2 weeks  with last 2 cycles of chemo, she will then continue maintenance Herceptin every 3 weeks to complete 1 year.  I will send a message to Dr. Isidore Moos  to proceed with adjuvant radiation after she completes adjuvant chemo.   Orders Placed This Encounter  Procedures  . DG Chest 2 View    Standing Status:   Future    Standing Expiration Date:   05/21/2022    Order Specific Question:   Reason for Exam (SYMPTOM  OR DIAGNOSIS REQUIRED)    Answer:   progressive cough, on chemo    Order Specific Question:   Preferred imaging location?    Answer:   [facility]  . ECHOCARDIOGRAM COMPLETE    Standing Status:   Future    Standing Expiration Date:   05/21/2022    Order Specific Question:   Where should this test be performed    Answer:   [facility]    Order Specific Question:   Perflutren DEFINITY (image enhancing agent) should be administered unless hypersensitivity or allergy exist    Answer:   Administer Perflutren    Order Specific Question:   Reason for exam-Echo    Answer:   Chemo  [id number]    Order Specific Question:   Other Comments    Answer:   s/p weekly herceptin completed 9 cycles, progressive cough and trace ankle edema   All questions were answered. The patient knows to call the clinic with any problems, questions or concerns. No barriers to learning were detected. Total encounter time was 30 minutes.      Alla Feeling, NP 05/21/21

## 2021-05-21 NOTE — Assessment & Plan Note (Signed)
Per oncology.  I will defer.  I thank all involved.

## 2021-05-21 NOTE — Assessment & Plan Note (Deleted)
Flu yearly Shingles deferred given ongoing treatment. PNA deferred Tetanus deferred COVID-vaccine up-to-date. Colonoscopy 2020 Mammogram up-to-date. Bone density deferred given recent events. Advance directive-husband designated patient were incapacitated. Cognitive function addressed- see scanned forms- and if abnormal then additional documentation follows.

## 2021-05-21 NOTE — Assessment & Plan Note (Addendum)
Advance directive- husband designated if patient were incapacitated.  

## 2021-05-21 NOTE — Progress Notes (Signed)
Patient with >10% weight gain.  Ok to adjust dose of trastuzumab accordingly.  Abraxane dose will not change.  T.O. Dr Lavonda Jumbo, PharmD

## 2021-05-21 NOTE — Assessment & Plan Note (Signed)
Flu yearly Shingles deferred given ongoing treatment. PNA deferred Tetanus deferred COVID-vaccine up-to-date. Colonoscopy 2020 Mammogram up-to-date. Bone density deferred given recent events. Advance directive-husband designated patient were incapacitated. Cognitive function addressed- see scanned forms- and if abnormal then additional documentation follows.

## 2021-05-21 NOTE — Patient Instructions (Signed)
[facility]  Discharge Instructions: Thank you for choosing [facility] to provide your oncology and hematology care.   If you have a lab appointment with the [facility], please go directly to the [facility] and check in at the registration area.   Wear comfortable clothing and clothing appropriate for easy access to any Portacath or PICC line.   We strive to give you quality time with your provider. You may need to reschedule your appointment if you arrive late (15 or more minutes).  Arriving late affects you and other patients whose appointments are after yours.  Also, if you miss three or more appointments without notifying the office, you may be dismissed from the clinic at the provider's discretion.      For prescription refill requests, have your pharmacy contact our office and allow 72 hours for refills to be completed.    Today you received the following chemotherapy and/or immunotherapy agents Kanjinti & Abraxane      To help prevent nausea and vomiting after your treatment, we encourage you to take your nausea medication as directed.  BELOW ARE SYMPTOMS THAT SHOULD BE REPORTED IMMEDIATELY: . *FEVER GREATER THAN 100.4 F (38 C) OR HIGHER . *CHILLS OR SWEATING . *NAUSEA AND VOMITING THAT IS NOT CONTROLLED WITH YOUR NAUSEA MEDICATION . *UNUSUAL SHORTNESS OF BREATH . *UNUSUAL BRUISING OR BLEEDING . *URINARY PROBLEMS (pain or burning when urinating, or frequent urination) . *BOWEL PROBLEMS (unusual diarrhea, constipation, pain near the anus) . TENDERNESS IN MOUTH AND THROAT WITH OR WITHOUT PRESENCE OF ULCERS (sore throat, sores in mouth, or a toothache) . UNUSUAL RASH, SWELLING OR PAIN  . UNUSUAL VAGINAL DISCHARGE OR ITCHING   Items with * indicate a potential emergency and should be followed up as soon as possible or go to the Emergency Department if any problems should occur.  Please show the CHEMOTHERAPY ALERT CARD or  IMMUNOTHERAPY ALERT CARD at check-in to the Emergency Department and triage nurse.  Should you have questions after your visit or need to cancel or reschedule your appointment, please contact [facility]  Dept: [phone]  and follow the prompts.  Office hours are 8:00 a.m. to 4:30 p.m. Monday - Friday. Please note that voicemails left after 4:00 p.m. may not be returned until the following business day.  We are closed weekends and major holidays. You have access to a nurse at all times for urgent questions. Please call the main number to the clinic Dept: [phone] and follow the prompts.   For any non-urgent questions, you may also contact your provider using MyChart. We now offer e-Visits for anyone 39 and older to request care online for non-urgent symptoms. For details visit [email or web address].   Also download the MyChart app! Go to the app store, search "MyChart", open the app, select [facility], and log in with your MyChart username and password.  Due to Covid, a mask is required upon entering the hospital/clinic. If you do not have a mask, one will be given to you upon arrival. For doctor visits, patients may have 1 support person aged 32 or older with them. For treatment visits, patients cannot have anyone with them due to current Covid guidelines and our immunocompromised population.   Trastuzumab injection for infusion What is this medicine? TRASTUZUMAB (tras TOO zoo mab) is a monoclonal antibody. It is used to treat breast cancer and stomach cancer. This medicine may be used for other purposes; ask your  health care provider or pharmacist if you have questions. COMMON BRAND NAME(S): Herceptin, Galvin Proffer, Trazimera What should I tell my health care provider before I take this medicine? They need to know if you have any of these conditions:  heart disease  heart failure  lung or breathing disease, like asthma  an  unusual or allergic reaction to trastuzumab, benzyl alcohol, or other medications, foods, dyes, or preservatives  pregnant or trying to get pregnant  breast-feeding How should I use this medicine? This drug is given as an infusion into a vein. It is administered in a hospital or clinic by a specially trained health care professional. Talk to your pediatrician regarding the use of this medicine in children. This medicine is not approved for use in children. Overdosage: If you think you have taken too much of this medicine contact a poison control center or emergency room at once. NOTE: This medicine is only for you. Do not share this medicine with others. What if I miss a dose? It is important not to miss a dose. Call your doctor or health care professional if you are unable to keep an appointment. What may interact with this medicine? This medicine may interact with the following medications:  certain types of chemotherapy, such as daunorubicin, doxorubicin, epirubicin, and idarubicin This list may not describe all possible interactions. Give your health care provider a list of all the medicines, herbs, non-prescription drugs, or dietary supplements you use. Also tell them if you smoke, drink alcohol, or use illegal drugs. Some items may interact with your medicine. What should I watch for while using this medicine? Visit your doctor for checks on your progress. Report any side effects. Continue your course of treatment even though you feel ill unless your doctor tells you to stop. Call your doctor or health care professional for advice if you get a fever, chills or sore throat, or other symptoms of a cold or flu. Do not treat yourself. Try to avoid being around people who are sick. You may experience fever, chills and shaking during your first infusion. These effects are usually mild and can be treated with other medicines. Report any side effects during the infusion to your health care  professional. Fever and chills usually do not happen with later infusions. Do not become pregnant while taking this medicine or for 7 months after stopping it. Women should inform their doctor if they wish to become pregnant or think they might be pregnant. Women of child-bearing potential will need to have a negative pregnancy test before starting this medicine. There is a potential for serious side effects to an unborn child. Talk to your health care professional or pharmacist for more information. Do not breast-feed an infant while taking this medicine or for 7 months after stopping it. Women must use effective birth control with this medicine. What side effects may I notice from receiving this medicine? Side effects that you should report to your doctor or health care professional as soon as possible:  allergic reactions like skin rash, itching or hives, swelling of the face, lips, or tongue  chest pain or palpitations  cough  dizziness  feeling faint or lightheaded, falls  fever  general ill feeling or flu-like symptoms  signs of worsening heart failure like breathing problems; swelling in your legs and feet  unusually weak or tired Side effects that usually do not require medical attention (report to your doctor or health care professional if they continue or are bothersome):  bone pain  changes in taste  diarrhea  joint pain  nausea/vomiting  weight loss This list may not describe all possible side effects. Call your doctor for medical advice about side effects. You may report side effects to FDA at 1-800-FDA-1088. Where should I keep my medicine? This drug is given in a hospital or clinic and will not be stored at home. NOTE: This sheet is a summary. It may not cover all possible information. If you have questions about this medicine, talk to your doctor, pharmacist, or health care provider.  2021 Elsevier/Gold Standard (2016-11-24 14:37:52)  Nanoparticle  Albumin-Bound Paclitaxel injection What is this medicine? NANOPARTICLE ALBUMIN-BOUND PACLITAXEL (Na no PAHR ti kuhl al BYOO muhn-bound PAK li TAX el) is a chemotherapy drug. It targets fast dividing cells, like cancer cells, and causes these cells to die. This medicine is used to treat advanced breast cancer, lung cancer, and pancreatic cancer. This medicine may be used for other purposes; ask your health care provider or pharmacist if you have questions. COMMON BRAND NAME(S): Abraxane What should I tell my health care provider before I take this medicine? They need to know if you have any of these conditions:  kidney disease  liver disease  low blood counts, like low white cell, platelet, or red cell counts  lung or breathing disease, like asthma  tingling of the fingers or toes, or other nerve disorder  an unusual or allergic reaction to paclitaxel, albumin, other chemotherapy, other medicines, foods, dyes, or preservatives  pregnant or trying to get pregnant  breast-feeding How should I use this medicine? This drug is given as an infusion into a vein. It is administered in a hospital or clinic by a specially trained health care professional. Talk to your pediatrician regarding the use of this medicine in children. Special care may be needed. Overdosage: If you think you have taken too much of this medicine contact a poison control center or emergency room at once. NOTE: This medicine is only for you. Do not share this medicine with others. What if I miss a dose? It is important not to miss your dose. Call your doctor or health care professional if you are unable to keep an appointment. What may interact with this medicine? This medicine may interact with the following medications:  antiviral medicines for hepatitis, HIV or AIDS  certain antibiotics like erythromycin and clarithromycin  certain medicines for fungal infections like ketoconazole and itraconazole  certain  medicines for seizures like carbamazepine, phenobarbital, phenytoin  gemfibrozil  nefazodone  rifampin  St. John's wort This list may not describe all possible interactions. Give your health care provider a list of all the medicines, herbs, non-prescription drugs, or dietary supplements you use. Also tell them if you smoke, drink alcohol, or use illegal drugs. Some items may interact with your medicine. What should I watch for while using this medicine? Your condition will be monitored carefully while you are receiving this medicine. You will need important blood work done while you are taking this medicine. This medicine can cause serious allergic reactions. If you experience allergic reactions like skin rash, itching or hives, swelling of the face, lips, or tongue, tell your doctor or health care professional right away. In some cases, you may be given additional medicines to help with side effects. Follow all directions for their use. This drug may make you feel generally unwell. This is not uncommon, as chemotherapy can affect healthy cells as well as cancer cells. Report any side effects.  Continue your course of treatment even though you feel ill unless your doctor tells you to stop. Call your doctor or health care professional for advice if you get a fever, chills or sore throat, or other symptoms of a cold or flu. Do not treat yourself. This drug decreases your body's ability to fight infections. Try to avoid being around people who are sick. This medicine may increase your risk to bruise or bleed. Call your doctor or health care professional if you notice any unusual bleeding. Be careful brushing and flossing your teeth or using a toothpick because you may get an infection or bleed more easily. If you have any dental work done, tell your dentist you are receiving this medicine. Avoid taking products that contain aspirin, acetaminophen, ibuprofen, naproxen, or ketoprofen unless instructed by  your doctor. These medicines may hide a fever. Do not become pregnant while taking this medicine or for 6 months after stopping it. Women should inform their doctor if they wish to become pregnant or think they might be pregnant. Men should not father a child while taking this medicine or for 3 months after stopping it. There is a potential for serious side effects to an unborn child. Talk to your health care professional or pharmacist for more information. Do not breast-feed an infant while taking this medicine or for 2 weeks after stopping it. This medicine may interfere with the ability to get pregnant or to father a child. You should talk to your doctor or health care professional if you are concerned about your fertility. What side effects may I notice from receiving this medicine? Side effects that you should report to your doctor or health care professional as soon as possible:  allergic reactions like skin rash, itching or hives, swelling of the face, lips, or tongue  breathing problems  changes in vision  fast, irregular heartbeat  low blood pressure  mouth sores  pain, tingling, numbness in the hands or feet  signs of decreased platelets or bleeding - bruising, pinpoint red spots on the skin, black, tarry stools, blood in the urine  signs of decreased red blood cells - unusually weak or tired, feeling faint or lightheaded, falls  signs of infection - fever or chills, cough, sore throat, pain or difficulty passing urine  signs and symptoms of liver injury like dark yellow or brown urine; general ill feeling or flu-like symptoms; light-colored stools; loss of appetite; nausea; right upper belly pain; unusually weak or tired; yellowing of the eyes or skin  swelling of the ankles, feet, hands  unusually slow heartbeat Side effects that usually do not require medical attention (report to your doctor or health care professional if they continue or are  bothersome):  diarrhea  hair loss  loss of appetite  nausea, vomiting  tiredness This list may not describe all possible side effects. Call your doctor for medical advice about side effects. You may report side effects to FDA at 1-800-FDA-1088. Where should I keep my medicine? This drug is given in a hospital or clinic and will not be stored at home. NOTE: This sheet is a summary. It may not cover all possible information. If you have questions about this medicine, talk to your doctor, pharmacist, or health care provider.  2021 Elsevier/Gold Standard (2017-08-03 13:03:45)

## 2021-05-22 ENCOUNTER — Other Ambulatory Visit: Payer: Self-pay | Admitting: Hematology

## 2021-05-22 ENCOUNTER — Telehealth: Payer: Self-pay

## 2021-05-22 NOTE — Telephone Encounter (Signed)
  She stated that 3 hours after treatment yesterday her face felt numb from below both eyes to her chin.  She had no other symptoms.  She said when she awoke this am this sensation was gone.  She states she did not have any type of reaction during treatment yesterday.  I spoke with Ms Nunn and let her know MRI of the brain has been scheduled.  I reviewed appt date and time.  She verbalized understanding.

## 2021-05-22 NOTE — Telephone Encounter (Signed)
This nurse spoke with patient and informed her of ECHO being scheduled for Friday 6/17 @ 9am.  Patient acknowledged  and agreed to appointment date and time.  Patient knows to call clinic with any problems, questons or concerns.

## 2021-05-23 ENCOUNTER — Encounter: Payer: Self-pay | Admitting: Hematology

## 2021-05-23 NOTE — Progress Notes (Addendum)
Aimee Mcclain   Telephone:([phone]) [phone] Fax:([phone]   Clinic Follow up Note   Patient Care Team: Aimee Ghent, MD as PCP - General (Family Medicine) Aimee Germany, RN as Oncology Nurse Navigator Aimee Kaufmann, RN as Oncology Nurse Navigator Aimee Klein, MD as Consulting Physician (General Surgery) Aimee Merle, MD as Consulting Physician (Hematology) Aimee Gibson, MD as Attending Physician (Radiation Oncology)  Date of Service:  05/28/2021  CHIEF COMPLAINT: F/u of left breast cancer   SUMMARY OF ONCOLOGIC HISTORY: Oncology History Overview Note  Cancer Staging Malignant neoplasm of upper-inner quadrant of left breast in female, estrogen receptor positive ([place]) Staging form: Breast, AJCC 8th Edition - Clinical stage from 01/14/2020: Stage IB (cT1c, cN1, cM0, G3, ER+, PR+, HER2+) - Signed by Aimee Merle, MD on 01/21/2021 Stage prefix: Initial diagnosis    Malignant neoplasm of upper-inner quadrant of left breast in female, estrogen receptor positive ([place])  01/14/2020 Cancer Staging   Staging form: Breast, AJCC 8th Edition - Clinical stage from 01/14/2020: Stage IB (cT1c, cN1, cM0, G3, ER+, PR+, HER2+) - Signed by Aimee Merle, MD on 01/21/2021  Stage prefix: Initial diagnosis    01/02/2021 Mammogram   Left breast with 1.9x1x1.8 cm complex cyst at 12:00 position, 4cmfn, versus saloid mass with calcifications, irregulr, increased in size. Biopsy recommended.    01/13/2021 Initial Biopsy   Diagnosis 1. Breast, left, needle core biopsy, 11 o'clock, 4cmfn - INVASIVE DUCTAL CARCINOMA - SEE COMMENT 2. Lymph node, needle/core biopsy, left axilla - FIBROVASCULAR AND ADIPOSE TISSUE - NO LYMPHOID TISSUE OR CARCINOMA IDENTIFIED Microscopic Comment 1. Based on the biopsy, the carcinoma appears Nottingham grade 3 of 3 and measures 1.2 cm in greatest linear extent. Prognostic markers (ER/PR/ki-67/HER2) are pending and will be reported in an addendum. Dr. Jeannie Mcclain reviewed  the case and agrees with the above diagnosis. These results were called to [facility] on January 14, 2021   01/13/2021 Receptors her2   1. PROGNOSTIC INDICATORS Results: IMMUNOHISTOCHEMICAL AND MORPHOMETRIC ANALYSIS PERFORMED MANUALLY The tumor cells are POSITIVE for Her2 (3+). Estrogen Receptor: 95%, POSITIVE, STRONG STAINING INTENSITY Progesterone Receptor: 85%, POSITIVE, STRONG STAINING INTENSITY Proliferation Marker Ki67: 30%   01/16/2021 Initial Diagnosis   Malignant neoplasm of upper-inner quadrant of left breast in female, estrogen receptor positive ([place])    01/23/2021 Imaging   MRI Breast  IMPRESSION: 1. Known malignancy measuring 2.9 centimeters in the UPPER INNER QUADRANT of the LEFT breast. There is no evidence for involvement of the pectoralis muscle or implant. 2. No evidence for lymphadenopathy. 3. RIGHT breast is negative. 4. Bilateral retropectoral saline implants.   02/09/2021 Imaging   MRI Brain IMPRESSION: No evidence of intracranial metastatic disease.   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     02/13/2021 Surgery   LEFT BREAST LUMPECTOMY WITH RADIOACTIVE SEED AND SENTINEL LYMPH NODE BIOPSY WITH SEED TARGETED NODE and PAC Placed by Dr Aimee Mcclain   02/13/2021 Pathology Results   FINAL MICROSCOPIC DIAGNOSIS:   A. BREAST, LEFT, LUMPECTOMY:  - Invasive ductal carcinoma, 2.6 cm, Nottingham grade 3 of 3.  - Ductal carcinoma in situ, high grade with central necrosis.  - Margins of resection:       - Invasive carcinoma broadly involves the anterior and inferior  margins.  Invasive carcinoma is focally < 1 mm from each the superior  and medial margins.       - DCIS is focally < 1 mm from the inferior margin.  DCIS is focally  1 mm  from each the anterior and superior margins.  DCIS is focally 1-2  mm from the medial margin.  - Biopsy clip.  - See oncology table.   B. BREAST, LEFT, ANTERIOR TISSUE, EXCISION:  - Residual invasive and in-situ ductal  carcinoma.  - Residual invasive ductal carcinoma broadly involves the posterior  margin.  - Residual DCIS is focally 1 mm from the posterior margin.   C. BREAST, LEFT, INFERIOR MARGIN, EXCISION:  - No carcinoma identified.   D. SENTINEL LYMPH NODE, LEFT AXILLARY #1, BIOPSY:  - No carcinoma identified in one lymph node (0/1).   E. SENTINEL LYMPH NODE, LEFT AXILLARY #2, BIOPSY:  - Biopsy site.  - No distinct nodal tissue identified.  - No carcinoma identified.   F. SENTINEL LYMPH NODE, LEFT AXILLARY #3, BIOPSY:  - No carcinoma identified in one lymph node (0/1).   G. SENTINEL LYMPH NODE, LEFT AXILLARY #4, BIOPSY:  - No carcinoma identified in one lymph node (0/1).    02/13/2021 Cancer Staging   Staging form: Breast, AJCC 8th Edition - Pathologic stage from 02/13/2021: Stage IA (pT2, pN0, cM0, G3, ER+, PR+, HER2+) - Signed by Heilingoetter, Cassandra L, PA-C on 03/07/2021  Stage prefix: Initial diagnosis  Nuclear grade: G3  Histologic grading system: 3 grade system    03/18/2021 -  Chemotherapy    Patient is on Treatment Plan: BREAST PACLITAXEL + TRASTUZUMAB Q7D / TRASTUZUMAB Q21D          CURRENT THERAPY:  Adjuvant chemo weekly taxol and herceptin q12 weeks followed by maintenance herceptin q3 weeks to complete 1 year, starting 03/18/2021  INTERVAL HISTORY:  Aimee Mcclain is here for a follow up. She was last seen by me 05/07/21 and seen by NP [name] in interim. She presents to the clinic alone. She has developed intermittent numbness and tingling on face in the past two weeks, much worse since chemo last week, along with blurry vision, no double vision, no headaches, or other new symptoms She has mild numbness and tingling in her fingers and toes, but overall mild       Her cough has much improved with Prilosec, nonproductive, no fever or chills. Her appetite and energy level good, weight is stable. All other systems were reviewed with the patient and are  negative.  MEDICAL HISTORY:  Past Medical History:  Diagnosis Date   Breast cancer [facility])    Cataract    Colon polyps    Endometriosis    Family history of colon cancer    Family history of colonic polyps    Fuchs' corneal dystrophy    GERD (gastroesophageal reflux disease)    H/O bilateral breast implants    History of colon polyps    HPV in female    HSV (herpes simplex virus) infection    Migraine    S/P TAH (total abdominal hysterectomy) 1991    SURGICAL HISTORY: Past Surgical History:  Procedure Laterality Date   ABDOMINAL HYSTERECTOMY     BREAST BIOPSY     Benign.   BREAST ENHANCEMENT SURGERY     BREAST LUMPECTOMY Bilateral 1993 and 1995   BREAST LUMPECTOMY WITH RADIOACTIVE SEED AND SENTINEL LYMPH NODE BIOPSY Left 02/13/2021   Procedure: LEFT BREAST LUMPECTOMY WITH RADIOACTIVE SEED AND SENTINEL LYMPH NODE BIOPSY WITH SEED TARGETED NODE;  Surgeon: Aimee Klein, MD;  Location: [place];  Service: General;  Laterality: Left;   CATARACT EXTRACTION, BILATERAL     CESAREAN SECTION     PARTIAL  HYSTERECTOMY     PORTACATH PLACEMENT Right 02/13/2021   Procedure: INSERTION PORT-A-CATH;  Surgeon: Aimee Klein, MD;  Location: [place];  Service: General;  Laterality: Right;   TONSILLECTOMY     WISDOM TOOTH EXTRACTION      I have reviewed the social history and family history with the patient and they are unchanged from previous note.  ALLERGIES:  is allergic to ibuprofen, omnicef [cefdinir], proton pump inhibitors, roxicodone [oxycodone hcl], and vioxx [rofecoxib].  MEDICATIONS:  Current Outpatient Medications  Medication Sig Dispense Refill   acetaminophen (TYLENOL) 500 MG tablet Take 1,000 mg by mouth every 6 (six) hours as needed.     Calcium 600-200 MG-UNIT tablet Take 1 tablet by mouth daily.     calcium carbonate (TUMS - DOSED IN MG ELEMENTAL CALCIUM) 500 MG chewable tablet Chew 1 tablet by mouth as needed for indigestion or heartburn.  Takes 5-6 per day     lidocaine-prilocaine (EMLA) cream Apply 1 application topically as needed. 30 g 2   Multiple Vitamin (MULTIVITAMIN) tablet Take 1 tablet by mouth daily.     ondansetron (ZOFRAN) 8 MG tablet Take 1 tablet (8 mg total) by mouth every 8 (eight) hours as needed for nausea or vomiting. 30 tablet 2   prochlorperazine (COMPAZINE) 10 MG tablet Take 1 tablet (10 mg total) by mouth every 6 (six) hours as needed. 30 tablet 2   traMADol (ULTRAM) 50 MG tablet Take 1 tablet (50 mg total) by mouth every 6 (six) hours as needed. 10 tablet 0   valACYclovir (VALTREX) 500 MG tablet Take 1 tablet (500 mg total) by mouth 2 (two) times daily as needed.     Vitamin B Complex-C CAPS Take 1 capsule by mouth.     No current facility-administered medications for this visit.    PHYSICAL EXAMINATION: ECOG PERFORMANCE STATUS: 1 - Symptomatic but completely ambulatory  Vitals:   05/28/21 0832  BP: (!) 164/76  Pulse: 89  Resp: 18  Temp: 97.6 F (36.4 C)  SpO2: 100%   Filed Weights   05/28/21 0832  Weight: 206 lb 14.4 oz (93.8 kg)    GENERAL:alert, no distress and comfortable SKIN: skin color, texture, turgor are normal, no rashes or significant lesions EYES: normal, Conjunctiva are pink and non-injected, sclera clear Musculoskeletal:no cyanosis of digits and no clubbing  NEURO: alert & oriented x 3 with fluent speech, no focal motor/sensory deficits.  Vibration sensation test that showed mildly decreased sensation on face and hands  LABORATORY DATA:  I have reviewed the data as listed CBC Latest Ref Rng & Units 05/28/2021 05/21/2021 05/14/2021  WBC 4.0 - 10.5 K/uL 3.7(Mcclain) 5.0 4.6  Hemoglobin 12.0 - 15.0 g/dL 11.3(Mcclain) 11.5(Mcclain) 11.8(Mcclain)  Hematocrit 36.0 - 46.0 % 33.2(Mcclain) 33.6(Mcclain) 33.8(Mcclain)  Platelets 150 - 400 K/uL 283 298 268     CMP Latest Ref Rng & Units 05/21/2021 05/14/2021 05/07/2021  Glucose 70 - 99 mg/dL 94 95 104(H)  BUN 8 - 23 mg/dL 14 13 14   Creatinine 0.44 - 1.00 mg/dL 0.76 0.80 0.82   Sodium 135 - 145 mmol/Mcclain 145 143 142  Potassium 3.5 - 5.1 mmol/Mcclain 4.4 3.9 4.4  Chloride 98 - 111 mmol/Mcclain 109 107 107  CO2 22 - 32 mmol/Mcclain 26 24 25   Calcium 8.9 - 10.3 mg/dL 9.6 9.5 9.3  Total Protein 6.5 - 8.1 g/dL 7.0 6.9 6.9  Total Bilirubin 0.3 - 1.2 mg/dL 0.3 0.4 0.4  Alkaline Phos 38 - 126 U/Mcclain 94 89 90  AST 15 - 41 U/Mcclain 25 27 34  ALT 0 - 44 U/Mcclain 30 32 36      RADIOGRAPHIC STUDIES: I have personally reviewed the radiological images as listed and agreed with the findings in the report. No results found.   ASSESSMENT & PLAN:  Aimee Mcclain is a 64 y.o. female with    1. Malignant neoplasm of upper-inner quadrant of left breast, cT1cN1M0 stage Ib, ER+/PR+/HER2+, pT2N0 stage IA, grade 3 -Diagnosed 12/2020 s/p left lumpectomy and SLNB with Port-A-Cath placement 02/13/2021 by Dr. Barry Mcclain -Adjuvant therapy with weekly Taxol and Herceptin followed by maintenance Herceptin every 3 weeks to complete 1 year was recommended to reduce her risk of recurrence. Given her tumor < 3 cm and negative lymph node, I did not recommend more intensive chemo. -Staging work-up showed small indeterminate lung nodules and an indeterminate left parietal bone lesion which will monitor with future scans, otherwise negative for distant metastasis -She started weekly taxol/herceptin on 03/18/21, developed possible taxol reaction after C2 with flushing and chest heaviness at home, resolved. No intervention needed. 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 We also discussed the option of cancel Abraxane today and next week  -After she completes adjuvant chemo, will change trastuzumab to every 3 weeks, I will check if her insurance will approve trastuzumab injection -She is scheduled to see rad  onc for adjuvant radiation -Follow-up next week   2.  Peripheral neuropathy G2 -She has baseline neuropathy in her feet, etiology unknown -She is not diabetic, B12 level was normal  -No functional deficits -Due to her neuropathy on her face, will reduce Abraxane dose 50% today -I will call in Neurontin 100 mg as needed, benefit and side effects explained to her.  I recommend her to start at night   3. Hot flashes -She was previously on HRT which she stopped with cancer diagnosis in 12/2020 -Hot flashes are mild and tolerable at this time -We discussed SSRI versus gabapentin, she declined for now but may be interested if she has increased hot flashes on antiestrogen therapy   4. small indeterminate lung nodules and an indeterminate left parietal bone lesion on scan -will follow up with repeated brain MRI, which is scheduled for tomorrow -Follow-up CT chest in about 3  5. Dry cough -Likely related to acid reflux -Much improved with Prilosec     Plan -Due to worsening neuropathy, will reduce Abraxane dose to 50 mg/m today, and may skip next week (last dose) -will proceed trastuzumab today -I called in Neurontin 100 mg as needed for neuropathy -Follow-up in 1 week   No problem-specific Assessment & Plan notes found for this encounter.   No orders of the defined types were placed in this encounter.  All questions were answered. The patient knows to call the clinic with any problems, questions or concerns. No barriers to learning was detected. The total time spent in the appointment was 30 minutes.     Aimee Merle, MD 05/28/2021   I, Joslyn Devon, am acting as scribe for Aimee Merle, MD.   I have reviewed the above documentation for accuracy and completeness, and I agree with the above.    Addendum  Pt decided to skip Abraxane today and next week. Will proceed with Trastuzumab maintenance therapy  today.  I will check if her insurance will approve Herceptin injection.  Aimee Mcclain   05/28/2021

## 2021-05-26 ENCOUNTER — Other Ambulatory Visit: Payer: Self-pay

## 2021-05-26 ENCOUNTER — Ambulatory Visit: Payer: Medicare Other | Attending: General Surgery | Admitting: Physical Therapy

## 2021-05-26 ENCOUNTER — Telehealth: Payer: Self-pay | Admitting: Nurse Practitioner

## 2021-05-26 DIAGNOSIS — Z483 Aftercare following surgery for neoplasm: Secondary | ICD-10-CM

## 2021-05-26 NOTE — Therapy (Signed)
[place], [place], [address] Phone: [phone]   Fax:  [phone]  Physical Therapy Treatment  Patient Details  Name: Aimee Mcclain MRN: [id number] Date of Birth: 06-30-1956 Referring Provider (PT): Dr. Stark Klein   Encounter Date: 05/26/2021   PT End of Session - 05/26/21 0810     Visit Number 2    PT Start Time 0800    PT Stop Time 0806    PT Time Calculation (min) 6 min    Activity Tolerance Patient tolerated treatment well             Past Medical History:  Diagnosis Date   Breast cancer ([place])    Cataract    Colon polyps    Endometriosis    Family history of colon cancer    Family history of colonic polyps    Fuchs' corneal dystrophy    GERD (gastroesophageal reflux disease)    H/O bilateral breast implants    History of colon polyps    HPV in female    HSV (herpes simplex virus) infection    Migraine    S/P TAH (total abdominal hysterectomy) 1991    Past Surgical History:  Procedure Laterality Date   ABDOMINAL HYSTERECTOMY     BREAST BIOPSY     Benign.   BREAST ENHANCEMENT SURGERY     BREAST LUMPECTOMY Bilateral 1993 and 1995   BREAST LUMPECTOMY WITH RADIOACTIVE SEED AND SENTINEL LYMPH NODE BIOPSY Left 02/13/2021   Procedure: LEFT BREAST LUMPECTOMY WITH RADIOACTIVE SEED AND SENTINEL LYMPH NODE BIOPSY WITH SEED TARGETED NODE;  Surgeon: Stark Klein, MD;  Location: [place] [facility];  Service: General;  Laterality: Left;   CATARACT EXTRACTION, BILATERAL     CESAREAN SECTION     PARTIAL HYSTERECTOMY     PORTACATH PLACEMENT Right 02/13/2021   Procedure: INSERTION PORT-A-CATH;  Surgeon: Stark Klein, MD;  Location: [facility];  Service: General;  Laterality: Right;   TONSILLECTOMY     WISDOM TOOTH EXTRACTION      There were no vitals filed for this visit.   Subjective Assessment - 05/26/21 0757     Subjective Pt is here for SOZO screen     Pertinent History Patient was diagnosed on 01/02/2021 with 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 Ki67 is 30%. Patient underwent a left lumpectomy and sentinel node biopsy (4 negative nodes) on 02/13/2021.                    L-DEX FLOWSHEETS - 05/26/21 0700       L-DEX LYMPHEDEMA SCREENING   Measurement Type Unilateral    L-DEX MEASUREMENT EXTREMITY Upper Extremity    POSITION  Standing    DOMINANT SIDE Right    At Risk Side Left    BASELINE SCORE (UNILATERAL) 2.6    L-DEX SCORE (UNILATERAL) 1.9                                    PT Long Term Goals - 03/10/21 1444       PT LONG TERM GOAL #1   Title Patient will demonstrate she has regained full shoulder ROM and function post operatively compared to baselines.    Time 6    Period Months    Status Achieved  Plan - 05/26/21 0806     Clinical Impression Statement Pt measured 1.9 on Ldex which is -0.7 from baseline and within acceptable limits. No sleeve is recommended at this time.    PT Next Visit Plan continue SOZO screens every 3 months             Patient will benefit from skilled therapeutic intervention in order to improve the following deficits and impairments:     Visit Diagnosis: Aftercare following surgery for neoplasm     Problem List Patient Active Problem List   Diagnosis Date Noted   Medicare welcome exam 05/21/2021   Port-A-Cath in place 03/18/2021   Malignant neoplasm of upper-inner quadrant of left breast in female, estrogen receptor positive ([place]) 01/16/2021   Chronic migraine 08/21/2020   Screening mammogram, encounter for 06/30/2020   Paresthesia 06/09/2020   Neuropathy 02/14/2020   Atypical chest pain 02/14/2020   Healthcare maintenance 02/14/2020   Migraine    Advance care planning 01/16/2019   Family history of colon cancer    HPV in female    Donato Heinz. Owens Shark PT  [place] Levo 05/26/2021, 8:17 AM  [place], [place], [address] Phone: [phone]   Fax:  [phone]  Name: Aimee Mcclain MRN: [id number] Date of Birth: 1956/01/23

## 2021-05-26 NOTE — Telephone Encounter (Signed)
I called Ms. Moler to check on her respiratory symptoms and review CXR. She started prilosec 3-4 days ago and cough is much better. She notes if she takes a deep breath she feels like she may need to cough. Dyspnea is stable. Denies fever or chills. CXR report shows patchy density over left lung base (appears bilateral to me), possibly from early infection or atelectasis. I favor to hold antibiotics for now given her cough has improved clinically, and monitor closely. She agrees with the plan. She knows to call us if symptoms change/worsen. F/up and treatment 6/15.   [name], NP

## 2021-05-28 ENCOUNTER — Inpatient Hospital Stay (HOSPITAL_BASED_OUTPATIENT_CLINIC_OR_DEPARTMENT_OTHER): Payer: Medicare Other | Admitting: Hematology

## 2021-05-28 ENCOUNTER — Encounter: Payer: Self-pay | Admitting: *Deleted

## 2021-05-28 ENCOUNTER — Other Ambulatory Visit: Payer: Self-pay

## 2021-05-28 ENCOUNTER — Inpatient Hospital Stay: Payer: Medicare Other

## 2021-05-28 VITALS — BP 164/76 | HR 89 | Temp 97.6°F | Resp 18 | Ht 67.0 in | Wt 206.9 lb

## 2021-05-28 DIAGNOSIS — R059 Cough, unspecified: Secondary | ICD-10-CM | POA: Diagnosis not present

## 2021-05-28 DIAGNOSIS — N951 Menopausal and female climacteric states: Secondary | ICD-10-CM | POA: Diagnosis not present

## 2021-05-28 DIAGNOSIS — Z170 Estrogen receptor positive status [ER+]: Secondary | ICD-10-CM

## 2021-05-28 DIAGNOSIS — Z79899 Other long term (current) drug therapy: Secondary | ICD-10-CM | POA: Diagnosis not present

## 2021-05-28 DIAGNOSIS — G629 Polyneuropathy, unspecified: Secondary | ICD-10-CM | POA: Diagnosis not present

## 2021-05-28 DIAGNOSIS — C50212 Malignant neoplasm of upper-inner quadrant of left female breast: Secondary | ICD-10-CM

## 2021-05-28 DIAGNOSIS — Z5111 Encounter for antineoplastic chemotherapy: Secondary | ICD-10-CM | POA: Diagnosis present

## 2021-05-28 DIAGNOSIS — R918 Other nonspecific abnormal finding of lung field: Secondary | ICD-10-CM | POA: Diagnosis not present

## 2021-05-28 DIAGNOSIS — Z95828 Presence of other vascular implants and grafts: Secondary | ICD-10-CM

## 2021-05-28 LAB — CBC WITH DIFFERENTIAL (CANCER CENTER ONLY)
Abs Immature Granulocytes: 0.08 10*3/uL — ABNORMAL HIGH (ref 0.00–0.07)
Basophils Absolute: 0 10*3/uL (ref 0.0–0.1)
Basophils Relative: 1 %
Eosinophils Absolute: 0.1 10*3/uL (ref 0.0–0.5)
Eosinophils Relative: 2 %
HCT: 33.2 % — ABNORMAL LOW (ref 36.0–46.0)
Hemoglobin: 11.3 g/dL — ABNORMAL LOW (ref 12.0–15.0)
Immature Granulocytes: 2 %
Lymphocytes Relative: 37 %
Lymphs Abs: 1.4 10*3/uL (ref 0.7–4.0)
MCH: 30.5 pg (ref 26.0–34.0)
MCHC: 34 g/dL (ref 30.0–36.0)
MCV: 89.7 fL (ref 80.0–100.0)
Monocytes Absolute: 0.3 10*3/uL (ref 0.1–1.0)
Monocytes Relative: 9 %
Neutro Abs: 1.8 10*3/uL (ref 1.7–7.7)
Neutrophils Relative %: 49 %
Platelet Count: 283 10*3/uL (ref 150–400)
RBC: 3.7 MIL/uL — ABNORMAL LOW (ref 3.87–5.11)
RDW: 16.5 % — ABNORMAL HIGH (ref 11.5–15.5)
WBC Count: 3.7 10*3/uL — ABNORMAL LOW (ref 4.0–10.5)
nRBC: 0 % (ref 0.0–0.2)

## 2021-05-28 LAB — CMP (CANCER CENTER ONLY)
ALT: 26 U/L (ref 0–44)
AST: 22 U/L (ref 15–41)
Albumin: 4 g/dL (ref 3.5–5.0)
Alkaline Phosphatase: 93 U/L (ref 38–126)
Anion gap: 7 (ref 5–15)
BUN: 11 mg/dL (ref 8–23)
CO2: 25 mmol/L (ref 22–32)
Calcium: 9.5 mg/dL (ref 8.9–10.3)
Chloride: 110 mmol/L (ref 98–111)
Creatinine: 0.76 mg/dL (ref 0.44–1.00)
GFR, Estimated: >60 mL/min (ref >60)
Glucose, Bld: 90 mg/dL (ref 70–99)
Potassium: 4.1 mmol/L (ref 3.5–5.1)
Sodium: 142 mmol/L (ref 135–145)
Total Bilirubin: 0.4 mg/dL (ref 0.3–1.2)
Total Protein: 6.7 g/dL (ref 6.5–8.1)

## 2021-05-28 MED ORDER — DIPHENHYDRAMINE HCL 25 MG PO CAPS
50.0000 mg | ORAL_CAPSULE | Freq: Once | ORAL | Status: AC
  Administered 2021-05-28: 50 mg via ORAL

## 2021-05-28 MED ORDER — DIPHENHYDRAMINE HCL 25 MG PO CAPS
ORAL_CAPSULE | ORAL | Status: AC
  Filled 2021-05-28: qty 2

## 2021-05-28 MED ORDER — ACETAMINOPHEN 325 MG PO TABS
ORAL_TABLET | ORAL | Status: AC
  Filled 2021-05-28: qty 2

## 2021-05-28 MED ORDER — SODIUM CHLORIDE 0.9% FLUSH
10.0000 mL | Freq: Once | INTRAVENOUS | Status: AC
  Administered 2021-05-28: 10 mL
  Filled 2021-05-28: qty 10

## 2021-05-28 MED ORDER — TRASTUZUMAB-ANNS CHEMO 150 MG IV SOLR
6.0000 mg/kg | Freq: Once | INTRAVENOUS | Status: AC
  Administered 2021-05-28: 567 mg via INTRAVENOUS
  Filled 2021-05-28: qty 27

## 2021-05-28 MED ORDER — SODIUM CHLORIDE 0.9% FLUSH
10.0000 mL | INTRAVENOUS | Status: DC | PRN
  Administered 2021-05-28: 10 mL
  Filled 2021-05-28: qty 10

## 2021-05-28 MED ORDER — GABAPENTIN 100 MG PO CAPS: 100.0000 mg | ORAL_CAPSULE | Freq: Three times a day (TID) | ORAL | 0 refills | Status: DC

## 2021-05-28 MED ORDER — HEPARIN SOD (PORK) LOCK FLUSH 100 UNIT/ML IV SOLN
500.0000 [IU] | Freq: Once | INTRAVENOUS | Status: AC | PRN
  Administered 2021-05-28: 500 [IU]
  Filled 2021-05-28: qty 5

## 2021-05-28 MED ORDER — SODIUM CHLORIDE 0.9 % IV SOLN
Freq: Once | INTRAVENOUS | Status: AC
Start: 2021-05-28 — End: 2021-05-28
  Filled 2021-05-28: qty 250

## 2021-05-28 MED ORDER — ACETAMINOPHEN 325 MG PO TABS
650.0000 mg | ORAL_TABLET | Freq: Once | ORAL | Status: AC
  Administered 2021-05-28: 650 mg via ORAL

## 2021-05-28 NOTE — Patient Instructions (Signed)
[facility]COL[facility]  Discharge Instructions: ?Thank you for choosing [facility] to provide your oncology and hematology care.  ? ?If you have a lab appointment with the Cancer Center, please go directly to the Cancer Center and check in at the registration area. ?  ?Wear comfortable clothing and clothing appropriate for easy access to any Portacath or PICC line.  ? ?We strive to give you quality time with your provider. You may need to reschedule your appointment if you arrive late (15 or more minutes).  Arriving late affects you and other patients whose appointments are after yours.  Also, if you miss three or more appointments without notifying the office, you may be dismissed from the clinic at the provider?s discretion.    ?  ?For prescription refill requests, have your pharmacy contact our office and allow 72 hours for refills to be completed.   ? ?Today you received the following chemotherapy and/or immunotherapy agents: Trastuzumab    ?  ?To help prevent nausea and vomiting after your treatment, we encourage you to take your nausea medication as directed. ? ?BELOW ARE SYMPTOMS THAT SHOULD BE REPORTED IMMEDIATELY: ?*FEVER GREATER THAN 100.4 F (38 ?C) OR HIGHER ?*CHILLS OR SWEATING ?*NAUSEA AND VOMITING THAT IS NOT CONTROLLED WITH YOUR NAUSEA MEDICATION ?*UNUSUAL SHORTNESS OF BREATH ?*UNUSUAL BRUISING OR BLEEDING ?*URINARY PROBLEMS (pain or burning when urinating, or frequent urination) ?*BOWEL PROBLEMS (unusual diarrhea, constipation, pain near the anus) ?TENDERNESS IN MOUTH AND THROAT WITH OR WITHOUT PRESENCE OF ULCERS (sore throat, sores in mouth, or a toothache) ?UNUSUAL RASH, SWELLING OR PAIN  ?UNUSUAL VAGINAL DISCHARGE OR ITCHING  ? ?Items with * indicate a potential emergency and should be followed up as soon as possible or go to the Emergency Department if any problems should occur. ? ?Please show the CHEMOTHERAPY ALERT CARD or IMMUNOTHERAPY ALERT CARD at check-in  to the Emergency Department and triage nurse. ? ?Should you have questions after your visit or need to cancel or reschedule your appointment, please contact [facility]  Dept: [phone]  and follow the prompts.  Office hours are 8:00 a.m. to 4:30 p.m. Monday - Friday. Please note that voicemails left after 4:00 p.m. may not be returned until the following business day.  We are closed weekends and major holidays. You have access to a nurse at all times for urgent questions. Please call the main number to the clinic Dept: [phone] and follow the prompts. ? ? ?For any non-urgent questions, you may also contact your provider using MyChart. We now offer e-Visits for anyone 18 and older to request care online for non-urgent symptoms. For details visit [email or web address]. ?  ?Also download the MyChart app! Go to the app store, search "MyChart", open the app, select [facility], and log in with your MyChart username and password. ? ?Due to Covid, a mask is required upon entering the hospital/clinic. If you do not have a mask, one will be given to you upon arrival. For doctor visits, patients may have 1 support person aged 18 or older with them. For treatment visits, patients cannot have anyone with them due to current Covid guidelines and our immunocompromised population.  ? ?

## 2021-05-28 NOTE — Addendum Note (Signed)
Addended by: Truitt Merle on: 05/28/2021 10:38 AM   Modules accepted: Orders

## 2021-05-29 ENCOUNTER — Encounter: Payer: Self-pay | Admitting: *Deleted

## 2021-05-29 ENCOUNTER — Ambulatory Visit (HOSPITAL_COMMUNITY)
Admission: RE | Admit: 2021-05-29 | Discharge: 2021-05-29 | Disposition: A | Discharge status: Home / Self Care | Payer: Medicare Other | Source: Ambulatory Visit | Attending: Physician Assistant | Admitting: Physician Assistant

## 2021-05-29 DIAGNOSIS — Z170 Estrogen receptor positive status [ER+]: Secondary | ICD-10-CM | POA: Diagnosis present

## 2021-05-29 DIAGNOSIS — C50912 Malignant neoplasm of unspecified site of left female breast: Secondary | ICD-10-CM | POA: Diagnosis present

## 2021-05-29 MED ORDER — GADOBUTROL 1 MMOL/ML IV SOLN
9.0000 mL | Freq: Once | INTRAVENOUS | Status: AC | PRN
  Administered 2021-05-29: 9 mL via INTRAVENOUS

## 2021-05-30 ENCOUNTER — Other Ambulatory Visit: Payer: Self-pay

## 2021-05-30 ENCOUNTER — Ambulatory Visit (HOSPITAL_COMMUNITY)
Admission: RE | Admit: 2021-05-30 | Discharge: 2021-05-30 | Disposition: A | Discharge status: Home / Self Care | Payer: Medicare Other | Source: Ambulatory Visit | Attending: Nurse Practitioner | Admitting: Nurse Practitioner

## 2021-05-30 ENCOUNTER — Encounter: Payer: Self-pay | Admitting: Hematology

## 2021-05-30 DIAGNOSIS — C50212 Malignant neoplasm of upper-inner quadrant of left female breast: Secondary | ICD-10-CM | POA: Insufficient documentation

## 2021-05-30 DIAGNOSIS — Z170 Estrogen receptor positive status [ER+]: Secondary | ICD-10-CM | POA: Diagnosis present

## 2021-05-30 DIAGNOSIS — Z01810 Encounter for preprocedural cardiovascular examination: Secondary | ICD-10-CM | POA: Insufficient documentation

## 2021-05-30 DIAGNOSIS — K219 Gastro-esophageal reflux disease without esophagitis: Secondary | ICD-10-CM | POA: Diagnosis not present

## 2021-05-30 DIAGNOSIS — Z0189 Encounter for other specified special examinations: Secondary | ICD-10-CM

## 2021-05-30 LAB — ECHOCARDIOGRAM COMPLETE
Area-P 1/2: 4.3 cm2
S' Lateral: 2.5 cm

## 2021-05-30 NOTE — Progress Notes (Signed)
  Echocardiogram 2D Echocardiogram has been performed.  Aimee Mcclain Aimee Mcclain 05/30/2021, 10:06 AM

## 2021-06-01 ENCOUNTER — Encounter: Payer: Self-pay | Admitting: Hematology

## 2021-06-02 ENCOUNTER — Encounter: Payer: Self-pay | Admitting: Hematology

## 2021-06-04 ENCOUNTER — Inpatient Hospital Stay: Payer: Medicare Other

## 2021-06-04 ENCOUNTER — Inpatient Hospital Stay: Payer: Medicare Other | Admitting: Hematology

## 2021-06-04 ENCOUNTER — Encounter: Payer: Self-pay | Admitting: *Deleted

## 2021-06-16 NOTE — Progress Notes (Signed)
Radiation Oncology         [phone] ________________________________  Outpatient follow-up Name: Aimee Mcclain MRN: [id number]  Date: 06/18/2021  DOB: 1956/11/01  HW:[id number], Elveria Rising, MD  Truitt Merle, MD   REFERRING PHYSICIAN: Truitt Merle, MD  DIAGNOSIS:    ICD-10-CM   1. 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, ER+ / PR+ / Her2+, Grade 3 Cancer Staging Malignant neoplasm of upper-inner quadrant of left breast in female, estrogen receptor positive ([place]) Staging form: Breast, AJCC 8th Edition - Clinical stage from 01/14/2020: Stage IB (cT1c, cN1, cM0, G3, ER+, PR+, HER2+) - Signed by Truitt Merle, MD on 01/21/2021 Stage prefix: Initial diagnosis - Pathologic stage from 02/13/2021: Stage IA (pT2, pN0, cM0, G3, ER+, PR+, HER2+) - Signed by Heilingoetter, Cassandra L, PA-C on 03/07/2021 Stage prefix: Initial diagnosis Nuclear grade: G3 Histologic grading system: 3 grade system  CHIEF COMPLAINT: Here to discuss management of left breast cancer  HISTORY OF PRESENT ILLNESS::Aimee Mcclain is a 65 y.o. female who presented with breast abnormality on the following imaging:  follow-up left breast mammogram on 01/02/2021 for previously seen asymmetry and grouped calcifications. No symptoms were reported at that time. Ultrasound of the left breast on 01/13/2021 revealed a 1.8 x 1.5 x 1.0 cm in the left breast at the 11 o'clock position, 4 cm from the nipple. Needle core biopsy of he left breast at 11 o'clock 4cmfn on date of 01/13/21 showed invasive ductal carcinoma measuring 1.2 cm in the greatest linear extent.  Histology: ER status: 95% +; PR status 85+, both with strong staining intensity, Her2 status positive, Ki67 30%; Grade 3.  MRI of the breast taken on 01/23/21 revealed the known malignancy in  the UIQ of the left breast measuring 2.9 cm. (No evidence was seen to suggest involvement of the pectoralis or implant).   MRI of the brain received on 02/09/21 showed no evidence of intracranial metastatic disease, although it did show possible left parietal calvarial metastasis which will be monitored by medical oncology.  On 02/13/21, the patient underwent left breast lumpectomy with sentinel lymph node biopsy. Surgical pathology from lumpectomy showed invasive ductal carcinoma measuring 2.6 cm grade 3, and high-grade ductal carcinoma in situ with central necrosis. Margins of resection were as follows: invasive carcinoma broadly involving the anterior and inferior margins and is focally < 1 mm from each the superior and medial margins. The DCIS is focally < 1 mm from the inferior margin, 1 mm from the anterior and superior margins (each), and 1-2 mm from the medial margin.  Pathology from the left breast tissue excision revealed residual invasive and in-situ ductal carcinoma with the residual invasive ductal carcinoma broadly involvign the posterior  margin. The residual DCIS was noted to be focally 1 mm from the posterior margin. 0/4 sentinel left axillary lymph nodes biopsied were indicative of carcinoma.  ER 95% +, PR 85%, both with strong staining intensity +, Her2 +, Ki67 30%, grade 3.  PET scan of skull base to thigh taken on 03/06/21 showed no evidence of hypermetabolic metastatic disease. Imaging did however show three scattered small solid right pulmonary nodules, the the largest measuring 4 mm.   Chest x-ray taken on 05/21/21 showed subtle patchy density over the left lung base noted to may  be due to early infection or atelectasis.  MRI of the brain taken on 05/29/21 showed the previously seen parietal bone lesion on 02/09/21 to be unchanged and indeterminate in appearance.   Systemic therapy summary : she has received adjuvant therapy with weekly Taxol and Herceptin followed by maintenance  Herceptin every 3 weeks to complete 1 year (Herceptin continues currently).  Of note she had a  possible taxol reaction after C2 with flushing and chest heaviness at home, resolved. No intervention needed. Switched to abraxane with C3, tolerated much better   Now has completed chemotherapy and just receiving Herceptin.  PREVIOUS RADIATION THERAPY: No  PAST MEDICAL HISTORY:  has a past medical history of Breast cancer ([place]), Cataract, Colon polyps, Endometriosis, Family history of colon cancer, Family history of colonic polyps, Fuchs' corneal dystrophy, GERD (gastroesophageal reflux disease), H/O bilateral breast implants, History of colon polyps, HPV in female, HSV (herpes simplex virus) infection, Migraine, and S/P TAH (total abdominal hysterectomy) (1991).    PAST SURGICAL HISTORY: Past Surgical History:  Procedure Laterality Date   ABDOMINAL HYSTERECTOMY     BREAST BIOPSY     Benign.   BREAST ENHANCEMENT SURGERY     BREAST LUMPECTOMY Bilateral 1993 and 1995   BREAST LUMPECTOMY WITH RADIOACTIVE SEED AND SENTINEL LYMPH NODE BIOPSY Left 02/13/2021   Procedure: LEFT BREAST LUMPECTOMY WITH RADIOACTIVE SEED AND SENTINEL LYMPH NODE BIOPSY WITH SEED TARGETED NODE;  Surgeon: Stark Klein, MD;  Location: [facility];  Service: General;  Laterality: Left;   CATARACT EXTRACTION, BILATERAL     CESAREAN SECTION     PARTIAL HYSTERECTOMY     PORTACATH PLACEMENT Right 02/13/2021   Procedure: INSERTION PORT-A-CATH;  Surgeon: Stark Klein, MD;  Location: [facility];  Service: General;  Laterality: Right;   TONSILLECTOMY     WISDOM TOOTH EXTRACTION      FAMILY HISTORY: family history includes Colon cancer in her father, maternal grandfather, and mother; Colon polyps in her brother and brother; Crohn's disease in her daughter; Dementia in her maternal grandmother; Diabetes in her brother and brother; Heart disease in her maternal grandfather, maternal uncle, and mother; Lymphoma  in her maternal aunt; Rectal cancer (age of onset: 19) in her mother.  SOCIAL HISTORY:  reports that she quit smoking about 27 years ago. Her smoking use included cigarettes. She has a 5.00 pack-year smoking history. She has never used smokeless tobacco. She reports current alcohol use. She reports that she does not use drugs.  ALLERGIES: Ibuprofen, Omnicef [cefdinir], Proton pump inhibitors, Roxicodone [oxycodone hcl], and Vioxx [rofecoxib]  MEDICATIONS:  Current Outpatient Medications  Medication Sig Dispense Refill   omeprazole (PRILOSEC) 20 MG capsule Take 20 mg by mouth daily.     acetaminophen (TYLENOL) 500 MG tablet Take 1,000 mg by mouth every 6 (six) hours as needed.     Calcium 600-200 MG-UNIT tablet Take 1 tablet by mouth daily.     calcium carbonate (TUMS - DOSED IN MG ELEMENTAL CALCIUM) 500 MG chewable tablet Chew 1 tablet by mouth as needed for indigestion or heartburn. Takes 5-6 per day     gabapentin (NEURONTIN) 100 MG capsule Take 1 capsule (100 mg total) by mouth 3 (three) times daily. 60 capsule 0   lidocaine-prilocaine (EMLA) cream Apply 1 application topically as needed. 30 g 2   Multiple Vitamin (MULTIVITAMIN) tablet Take 1 tablet by mouth daily.     ondansetron (ZOFRAN) 8 MG tablet Take 1 tablet (8 mg total) by mouth every 8 (  eight) hours as needed for nausea or vomiting. 30 tablet 2   prochlorperazine (COMPAZINE) 10 MG tablet Take 1 tablet (10 mg total) by mouth every 6 (six) hours as needed. 30 tablet 2   traMADol (ULTRAM) 50 MG tablet Take 1 tablet (50 mg total) by mouth every 6 (six) hours as needed. 10 tablet 0   valACYclovir (VALTREX) 500 MG tablet Take 1 tablet (500 mg total) by mouth 2 (two) times daily as needed.     Vitamin B Complex-C CAPS Take 1 capsule by mouth.     No current facility-administered medications for this encounter.    REVIEW OF SYSTEMS: As above   PHYSICAL EXAM:  height is 5' 7"  (1.702 m) and weight is 206 lb 2 oz (93.5 kg). Her temporal  temperature is 97 F (36.1 C) (abnormal). Her blood pressure is 172/86 (abnormal) and her pulse is 79. Her respiration is 18 and oxygen saturation is 99%.   General: Alert and oriented, in no acute distress HEENT: Head is normocephalic.  Alopecia. Musculoskeletal: Good range of motion in left shoulder   Neurologic:  No obvious focalities. Speech is fluent. Coordination is intact. Psychiatric: Judgment and insight are intact. Affect is appropriate. Breasts: Satisfactory healing of left breast status post breast conserving surgery   ECOG = 0  0 - Asymptomatic (Fully active, able to carry on all predisease activities without restriction)  1 - Symptomatic but completely ambulatory (Restricted in physically strenuous activity but ambulatory and able to carry out work of a light or sedentary nature. For example, light housework, office work)  2 - Symptomatic, <50% in bed during the day (Ambulatory and capable of all self care but unable to carry out any work activities. Up and about more than 50% of waking hours)  3 - Symptomatic, >50% in bed, but not bedbound (Capable of only limited self-care, confined to bed or chair 50% or more of waking hours)  4 - Bedbound (Completely disabled. Cannot carry on any self-care. Totally confined to bed or chair)  5 - Death   Eustace Pen MM, Creech RH, Tormey DC, et al. [phone]). "Toxicity and response criteria of the [facility]". [place] Oncol. 5 (6): 649-55   LABORATORY DATA:  Lab Results  Component Value Date   WBC 3.7 (L) 05/28/2021   HGB 11.3 (L) 05/28/2021   HCT 33.2 (L) 05/28/2021   MCV 89.7 05/28/2021   PLT 283 05/28/2021   CMP     Component Value Date/Time   NA 142 05/28/2021 0814   K 4.1 05/28/2021 0814   CL 110 05/28/2021 0814   CO2 25 05/28/2021 0814   GLUCOSE 90 05/28/2021 0814   BUN 11 05/28/2021 0814   CREATININE 0.76 05/28/2021 0814   CALCIUM 9.5 05/28/2021 0814   PROT 6.7 05/28/2021 0814   PROT 6.8  08/21/2020 1121   ALBUMIN 4.0 05/28/2021 0814   AST 22 05/28/2021 0814   ALT 26 05/28/2021 0814   ALKPHOS 93 05/28/2021 0814   BILITOT 0.4 05/28/2021 0814   GFRNONAA >60 05/28/2021 0814         RADIOGRAPHY: DG Chest 2 View  Result Date: 05/24/2021 CLINICAL DATA:  Progressive cough. Patient on chemotherapy currently. Shortness of breath. EXAM: CHEST - 2 VIEW COMPARISON:  02/13/2021 FINDINGS: Right subclavian Port-A-Cath unchanged. Lungs are adequately inflated and demonstrate subtle patchy density over the left base slight worsening compared to the prior exam. Findings may be due to early infection or atelectasis. No effusion. Cardiomediastinal  silhouette and remainder of the exam is unchanged. IMPRESSION: Subtle patchy density over the left base which may be due to early infection or atelectasis. Electronically Signed   By: Marin Olp M.D.   On: 05/24/2021 07:58   MR Brain W Wo Contrast  Result Date: 05/29/2021 CLINICAL DATA:  Metastatic breast cancer, reassess parietal lesion EXAM: MRI HEAD WITHOUT AND WITH CONTRAST TECHNIQUE: Multiplanar, multiecho pulse sequences of the brain and surrounding structures were obtained without and with intravenous contrast. CONTRAST:  31m GADAVIST GADOBUTROL 1 MMOL/ML IV SOLN COMPARISON:  03/09/2021 FINDINGS: Brain: No abnormal enhancement, infarction, hemorrhage, hydrocephalus, extra-axial collection or mass lesion. Age congruent brain volume and white matter appearance. Vascular: Normal flow voids and vascular enhancements Skull and upper cervical spine: Peripherally hypointense/sclerotic and centrally enhancing elongated rounded bone lesion in the left parietal calvarium measuring 3 cm in diameter, unchanged. Unfortunately this level was not covered on recent PET-CT. Sinuses/Orbits: Negative IMPRESSION: Unchanged left parietal bone lesion which is indeterminate. Unfortunately, this level was not covered on recent PET CT, although it is reassuring that there is  no generalized osseous metastatic disease. Suggest noncontrast head CT to evaluate any matrix such as with fibrous dysplasia. Electronically Signed   By: JMonte FantasiaM.D.   On: 05/29/2021 14:48   ECHOCARDIOGRAM COMPLETE  Result Date: 05/30/2021    ECHOCARDIOGRAM REPORT   Patient Name:   JDoyce LooseDEPRIEST Date of Exam: 05/30/2021 Medical Rec #:  [id number]         Height:       67.0 in Accession #:    [id number]        Weight:       206.9 lb Date of Birth:  611-28-1957         BSA:          2.052 m Patient Age:    89years           BP:           153/76 mmHg Patient Gender: F                  HR:           85 bpm. Exam Location:  Outpatient Procedure: 2D Echo, 3D Echo, Cardiac Doppler, Color Doppler and Strain Analysis Indications:    Z51.11 Encounter for antineoplastic chemotheraphy  History:        Patient has prior history of Echocardiogram examinations, most                 recent 03/12/2021. Risk Factors:GERD.  Sonographer:    TJonelle SidleDance Referring Phys: [id number] 1. Left ventricular ejection fraction, by estimation, is 60 to 65%. Left ventricular ejection fraction by 3D volume is 65 %. The left ventricle has normal function. The left ventricle has no regional wall motion abnormalities. Left ventricular diastolic  parameters are consistent with Grade I diastolic dysfunction (impaired relaxation). The average left ventricular global longitudinal strain is -21.2 %. The global longitudinal strain is normal.  2. Right ventricular systolic function is normal. The right ventricular size is normal.  3. The mitral valve is normal in structure. No evidence of mitral valve regurgitation.  4. The aortic valve is tricuspid. Aortic valve regurgitation is not visualized.  5. The inferior vena cava is normal in size with greater than 50% respiratory variability, suggesting right atrial pressure of 3 mmHg. Comparison(s): No significant change from prior study. 03/12/2021: LVEF 60-65%, GLS  of  -22.4%. FINDINGS  Left Ventricle: Left ventricular ejection fraction, by estimation, is 60 to 65%. Left ventricular ejection fraction by 3D volume is 65 %. The left ventricle has normal function. The left ventricle has no regional wall motion abnormalities. The average left ventricular global longitudinal strain is -21.2 %. The global longitudinal strain is normal. The left ventricular internal cavity size was normal in size. There is no left ventricular hypertrophy. Left ventricular diastolic parameters are consistent  with Grade I diastolic dysfunction (impaired relaxation). Indeterminate filling pressures. Right Ventricle: The right ventricular size is normal. No increase in right ventricular wall thickness. Right ventricular systolic function is normal. Left Atrium: Left atrial size was normal in size. Right Atrium: Right atrial size was normal in size. Pericardium: There is no evidence of pericardial effusion. Mitral Valve: The mitral valve is normal in structure. No evidence of mitral valve regurgitation. Tricuspid Valve: The tricuspid valve is grossly normal. Tricuspid valve regurgitation is not demonstrated. Aortic Valve: The aortic valve is tricuspid. Aortic valve regurgitation is not visualized. Pulmonic Valve: The pulmonic valve was grossly normal. Pulmonic valve regurgitation is trivial. Aorta: The aortic root and ascending aorta are structurally normal, with no evidence of dilitation. Venous: The inferior vena cava is normal in size with greater than 50% respiratory variability, suggesting right atrial pressure of 3 mmHg. IAS/Shunts: No atrial level shunt detected by color flow Doppler.  LEFT VENTRICLE PLAX 2D LVIDd:         4.00 cm         Diastology LVIDs:         2.50 cm         LV e' medial:    9.36 cm/s LV PW:         1.20 cm         LV E/e' medial:  9.8 LV IVS:        1.10 cm         LV e' lateral:   6.85 cm/s LVOT diam:     2.00 cm         LV E/e' lateral: 13.4 LV SV:         83 LV SV Index:   41               2D LVOT Area:     3.14 cm        Longitudinal                                Strain                                2D Strain GLS  -14.4 %                                (A2C):                                2D Strain GLS  -22.3 %                                (A3C):  2D Strain GLS  -26.9 %                                (A4C):                                2D Strain GLS  -21.2 %                                Avg:                                 3D Volume EF                                LV 3D EF:    Left                                             ventricular                                             ejection                                             fraction by                                             3D volume                                             is 65 %.                                 3D Volume EF:                                3D EF:        65 %                                LV EDV:       98 ml                                LV ESV:       34 ml                                LV  SV:        64 ml RIGHT VENTRICLE             IVC RV Basal diam:  2.50 cm     IVC diam: 1.60 cm RV S prime:     13.70 cm/s TAPSE (M-mode): 2.0 cm LEFT ATRIUM           Index       RIGHT ATRIUM           Index LA diam:      3.70 cm 1.80 cm/m  RA Area:     12.50 cm LA Vol (A2C): 35.7 ml 17.40 ml/m RA Volume:   27.80 ml  13.55 ml/m LA Vol (A4C): 66.8 ml 32.56 ml/m  AORTIC VALVE LVOT Vmax:   143.50 cm/s LVOT Vmean:  92.150 cm/s LVOT VTI:    0.264 m  AORTA Ao Root diam: 3.30 cm Ao Asc diam:  3.70 cm MITRAL VALVE MV Area (PHT): 4.30 cm    SHUNTS MV Decel Time: 177 msec    Systemic VTI:  0.26 m MV E velocity: 92.10 cm/s  Systemic Diam: 2.00 cm MV A velocity: 78.05 cm/s MV E/A ratio:  1.18 Lyman Bishop MD Electronically signed by Lyman Bishop MD Signature Date/Time: 05/30/2021/3:38:33 PM    Final       IMPRESSION/PLAN: Left breast cancer   We discussed adjuvant  radiotherapy today.  I recommend 4 weeks of adjuvant therapy to the left breast in order to reduce risk of local recurrence by two thirds.  I reviewed the logistics, benefits, risks, and potential side effects of this treatment in detail. Risks may include but not necessary be limited to acute and late injury tissue in the radiation fields such as skin irritation (change in color/pigmentation, itching, dryness, pain, peeling). She may experience fatigue. We also discussed possible risk of long term cosmetic changes or scar tissue. There is also a smaller risk for lung toxicity, cardiac toxicity, capsular contracture, scar tissue and/or discomfort around breast implant, lymphedema, musculoskeletal changes, rib fragility or induction of a second malignancy, late chronic non-healing soft tissue wound.    The patient asked good questions which I answered to her satisfaction. She is enthusiastic about proceeding with treatment. A consent form has been signed and placed in her chart.  Proceed with CT simulation today.  On date of service, in total, I spent 35 minutes on this encounter. Patient was seen in person.   __________________________________________   Eppie Gibson, MD   This document serves as a record of services personally performed by Eppie Gibson, MD. It was created on her behalf by Roney Mans, a trained medical scribe. The creation of this record is based on the scribe's personal observations and the provider's statements to them. This document has been checked and approved by the attending provider.

## 2021-06-17 NOTE — Progress Notes (Signed)
Location of Breast Cancer:  Malignant neoplasm of upper-inner quadrant of LEFT breast, estrogen receptor positive  Histology per Pathology Report:  02/13/2021 FINAL MICROSCOPIC DIAGNOSIS:  A. BREAST, LEFT, LUMPECTOMY:  - Invasive ductal carcinoma, 2.6 cm, Nottingham grade 3 of 3.  - Ductal carcinoma in situ, high grade with central necrosis.  - Margins of resection:       - Invasive carcinoma broadly involves the anterior and inferior margins.  Invasive carcinoma is focally < 1 mm from each the superior and medial margins.       - DCIS is focally < 1 mm from the inferior margin.  DCIS is focally 1 mm from each the anterior and superior margins.  DCIS is focally 1-2 mm from the medial margin.  - Biopsy clip.  - See oncology table.  B. BREAST, LEFT, ANTERIOR TISSUE, EXCISION:  - Residual invasive and in-situ ductal carcinoma.  - Residual invasive ductal carcinoma broadly involves the posterior margin.  - Residual DCIS is focally 1 mm from the posterior margin.  C. BREAST, LEFT, INFERIOR MARGIN, EXCISION:  - No carcinoma identified.  D. SENTINEL LYMPH NODE, LEFT AXILLARY #1, BIOPSY:  - No carcinoma identified in one lymph node (0/1).  E. SENTINEL LYMPH NODE, LEFT AXILLARY #2, BIOPSY:  - Biopsy site.  - No distinct nodal tissue identified.  - No carcinoma identified.  F. SENTINEL LYMPH NODE, LEFT AXILLARY #3, BIOPSY:  - No carcinoma identified in one lymph node (0/1).  G. SENTINEL LYMPH NODE, LEFT AXILLARY #4, BIOPSY:  - No carcinoma identified in one lymph node (0/1).   Receptor Status: ER(95%), PR (85%), Her2-neu (Positive), Ki-67(30%)  Did patient present with symptoms (if so, please note symptoms) or was this found on screening mammography?: Screening mammogram on 01/02/2021 showed left breast with 1.9x1x1.8 cm complex cyst at 12:00 position, 4cmfn, versus saloid mass with calcifications, irregulr, increased in size. Biopsy recommended  Past/Anticipated interventions by surgeon, if  any:  02/13/2021 Dr. Suzanna Obey --Left Breast Radioactive seed localized lumpectomy,  --sentinel lymph node biopsy --seed targeted lymph node biopsy --right subclavian port placement  Past/Anticipated interventions by medical oncology, if any:  Under care of Dr. Truitt Merle 05/28/2021 -Adjuvant therapy with weekly Taxol and Herceptin followed by maintenance Herceptin every 3 weeks to complete 1 year was recommended to reduce her risk of recurrence. Given her tumor < 3 cm and negative lymph node, I did not recommend more intensive chemo. -Staging work-up showed small indeterminate lung nodules and an indeterminate left parietal bone lesion which will monitor with future scans, otherwise negative for distant metastasis -She started weekly taxol/herceptin on 03/18/21, developed possible taxol reaction after C2 with flushing and chest heaviness at home, resolved. No intervention needed. 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 We also discussed the option of cancel Abraxane today and next week  -After she completes adjuvant chemo, will change trastuzumab to every 3 weeks, I will check if her insurance will approve trastuzumab injection -She is scheduled to see rad onc for adjuvant radiation -Follow-up next week  Lymphedema issues, if any:  Patient denies. Was evaluated by PT and June and no signs of lymphedema assessed. Reports she will be evaluated again in September. Denies any range of motion issues to her left arm/shoulder  Pain issues, if  any:  Patient denies   SAFETY ISSUES: Prior radiation? No Pacemaker/ICD? No Possible current pregnancy? No--hysterectomy  Is the patient on methotrexate? No  Current Complaints / other details:  Patient has received the first  3 Pfizer vaccines.

## 2021-06-18 ENCOUNTER — Ambulatory Visit
Admission: RE | Admit: 2021-06-18 | Discharge: 2021-06-18 | Disposition: A | Discharge status: Home / Self Care | Payer: Medicare Other | Source: Ambulatory Visit | Attending: Radiation Oncology | Admitting: Radiation Oncology

## 2021-06-18 ENCOUNTER — Encounter: Payer: Self-pay | Admitting: Radiation Oncology

## 2021-06-18 ENCOUNTER — Other Ambulatory Visit: Payer: Self-pay

## 2021-06-18 VITALS — BP 172/86 | HR 79 | Temp 97.0°F | Resp 18 | Ht 67.0 in | Wt 206.1 lb

## 2021-06-18 DIAGNOSIS — Z5111 Encounter for antineoplastic chemotherapy: Secondary | ICD-10-CM | POA: Diagnosis not present

## 2021-06-18 DIAGNOSIS — Z510 Encounter for antineoplastic radiation therapy: Secondary | ICD-10-CM | POA: Insufficient documentation

## 2021-06-18 DIAGNOSIS — C50212 Malignant neoplasm of upper-inner quadrant of left female breast: Secondary | ICD-10-CM | POA: Insufficient documentation

## 2021-06-18 DIAGNOSIS — N951 Menopausal and female climacteric states: Secondary | ICD-10-CM | POA: Diagnosis not present

## 2021-06-18 DIAGNOSIS — Z79899 Other long term (current) drug therapy: Secondary | ICD-10-CM | POA: Diagnosis not present

## 2021-06-18 DIAGNOSIS — Z170 Estrogen receptor positive status [ER+]: Secondary | ICD-10-CM | POA: Insufficient documentation

## 2021-06-18 DIAGNOSIS — G629 Polyneuropathy, unspecified: Secondary | ICD-10-CM | POA: Diagnosis not present

## 2021-06-19 ENCOUNTER — Other Ambulatory Visit: Payer: Self-pay

## 2021-06-19 ENCOUNTER — Ambulatory Visit (INDEPENDENT_AMBULATORY_CARE_PROVIDER_SITE_OTHER): Payer: Medicare Other | Admitting: Family Medicine

## 2021-06-19 ENCOUNTER — Encounter: Payer: Self-pay | Admitting: Family Medicine

## 2021-06-19 DIAGNOSIS — B009 Herpesviral infection, unspecified: Secondary | ICD-10-CM | POA: Insufficient documentation

## 2021-06-19 DIAGNOSIS — M8580 Other specified disorders of bone density and structure, unspecified site: Secondary | ICD-10-CM | POA: Insufficient documentation

## 2021-06-19 DIAGNOSIS — I10 Essential (primary) hypertension: Secondary | ICD-10-CM | POA: Diagnosis not present

## 2021-06-19 MED ORDER — LISINOPRIL 5 MG PO TABS
5.0000 mg | ORAL_TABLET | Freq: Every day | ORAL | 3 refills | Status: DC
Start: 2021-06-19 — End: 2021-12-17

## 2021-06-19 NOTE — Progress Notes (Signed)
This visit occurred during the SARS-CoV-2 public health emergency.  Safety protocols were in place, including screening questions prior to the visit, additional usage of staff PPE, and extensive cleaning of exam room while observing appropriate contact time as indicated for disinfecting solutions.  BP has been up since chemo start.  SBP 140 or higher.  Off chemo but BP still elevated.  No exertional CP.  She had SOB on chemo and that is slowly getting better.  She can walk 1/2 mile.  Some BLE edema, variable.    Cough and GERD improved with prilosec.    Meds, vitals, and allergies reviewed.   ROS: Per HPI unless specifically indicated in ROS section   GEN: nad, alert and oriented HEENT: ncat, hair loss noted on scalp NECK: supple w/o LA CV: rrr.  PULM: ctab, no inc wob ABD: soft, +bs EXT: no edema SKIN: Well-perfused

## 2021-06-19 NOTE — Patient Instructions (Signed)
If your BP is persistently >140/>90 then start lisinopril and let me know.  Take care.  Glad to see you.

## 2021-06-20 ENCOUNTER — Inpatient Hospital Stay (HOSPITAL_BASED_OUTPATIENT_CLINIC_OR_DEPARTMENT_OTHER): Payer: Medicare Other | Admitting: Hematology

## 2021-06-20 ENCOUNTER — Encounter: Payer: Self-pay | Admitting: Hematology

## 2021-06-20 ENCOUNTER — Inpatient Hospital Stay: Payer: Medicare Other | Attending: Hematology

## 2021-06-20 ENCOUNTER — Inpatient Hospital Stay: Payer: Medicare Other

## 2021-06-20 ENCOUNTER — Other Ambulatory Visit: Payer: Self-pay

## 2021-06-20 VITALS — BP 160/89 | HR 73 | Temp 98.6°F | Resp 15 | Ht 67.0 in | Wt 205.9 lb

## 2021-06-20 DIAGNOSIS — C50212 Malignant neoplasm of upper-inner quadrant of left female breast: Secondary | ICD-10-CM | POA: Insufficient documentation

## 2021-06-20 DIAGNOSIS — Z79899 Other long term (current) drug therapy: Secondary | ICD-10-CM | POA: Insufficient documentation

## 2021-06-20 DIAGNOSIS — C50912 Malignant neoplasm of unspecified site of left female breast: Secondary | ICD-10-CM

## 2021-06-20 DIAGNOSIS — Z5111 Encounter for antineoplastic chemotherapy: Secondary | ICD-10-CM | POA: Diagnosis not present

## 2021-06-20 DIAGNOSIS — Z170 Estrogen receptor positive status [ER+]: Secondary | ICD-10-CM

## 2021-06-20 DIAGNOSIS — G629 Polyneuropathy, unspecified: Secondary | ICD-10-CM | POA: Insufficient documentation

## 2021-06-20 DIAGNOSIS — N951 Menopausal and female climacteric states: Secondary | ICD-10-CM | POA: Insufficient documentation

## 2021-06-20 DIAGNOSIS — Z95828 Presence of other vascular implants and grafts: Secondary | ICD-10-CM

## 2021-06-20 LAB — CBC WITH DIFFERENTIAL (CANCER CENTER ONLY)
Abs Immature Granulocytes: 0.02 10*3/uL (ref 0.00–0.07)
Basophils Absolute: 0.1 10*3/uL (ref 0.0–0.1)
Basophils Relative: 1 %
Eosinophils Absolute: 0.2 10*3/uL (ref 0.0–0.5)
Eosinophils Relative: 3 %
HCT: 39.1 % (ref 36.0–46.0)
Hemoglobin: 13 g/dL (ref 12.0–15.0)
Immature Granulocytes: 0 %
Lymphocytes Relative: 29 %
Lymphs Abs: 1.8 10*3/uL (ref 0.7–4.0)
MCH: 30.3 pg (ref 26.0–34.0)
MCHC: 33.2 g/dL (ref 30.0–36.0)
MCV: 91.1 fL (ref 80.0–100.0)
Monocytes Absolute: 0.6 10*3/uL (ref 0.1–1.0)
Monocytes Relative: 10 %
Neutro Abs: 3.5 10*3/uL (ref 1.7–7.7)
Neutrophils Relative %: 57 %
Platelet Count: 216 10*3/uL (ref 150–400)
RBC: 4.29 MIL/uL (ref 3.87–5.11)
RDW: 14.7 % (ref 11.5–15.5)
WBC Count: 6.1 10*3/uL (ref 4.0–10.5)
nRBC: 0 % (ref 0.0–0.2)

## 2021-06-20 LAB — CMP (CANCER CENTER ONLY)
ALT: 24 U/L (ref 0–44)
AST: 25 U/L (ref 15–41)
Albumin: 4.1 g/dL (ref 3.5–5.0)
Alkaline Phosphatase: 101 U/L (ref 38–126)
Anion gap: 9 (ref 5–15)
BUN: 14 mg/dL (ref 8–23)
CO2: 26 mmol/L (ref 22–32)
Calcium: 9.3 mg/dL (ref 8.9–10.3)
Chloride: 108 mmol/L (ref 98–111)
Creatinine: 0.79 mg/dL (ref 0.44–1.00)
GFR, Estimated: >60 mL/min (ref >60)
Glucose, Bld: 89 mg/dL (ref 70–99)
Potassium: 4.1 mmol/L (ref 3.5–5.1)
Sodium: 143 mmol/L (ref 135–145)
Total Bilirubin: 0.5 mg/dL (ref 0.3–1.2)
Total Protein: 7.1 g/dL (ref 6.5–8.1)

## 2021-06-20 MED ORDER — SODIUM CHLORIDE 0.9% FLUSH
10.0000 mL | Freq: Once | INTRAVENOUS | Status: AC
Start: 2021-06-20 — End: 2021-06-20
  Administered 2021-06-20: 10 mL
  Filled 2021-06-20: qty 10

## 2021-06-20 MED ORDER — TRASTUZUMAB-HYALURONIDASE-OYSK 600-10000 MG-UNT/5ML ~~LOC~~ SOLN
600.0000 mg | Freq: Once | SUBCUTANEOUS | Status: AC
  Administered 2021-06-20: 600 mg via SUBCUTANEOUS
  Filled 2021-06-20: qty 5

## 2021-06-20 MED ORDER — SODIUM CHLORIDE 0.9 % IV SOLN
Freq: Once | INTRAVENOUS | Status: DC
  Filled 2021-06-20: qty 250

## 2021-06-20 MED ORDER — DIPHENHYDRAMINE HCL 25 MG PO CAPS
50.0000 mg | ORAL_CAPSULE | Freq: Once | ORAL | Status: AC
Start: 2021-06-20 — End: 2021-06-20
  Administered 2021-06-20: 50 mg via ORAL

## 2021-06-20 MED ORDER — ACETAMINOPHEN 325 MG PO TABS
650.0000 mg | ORAL_TABLET | Freq: Once | ORAL | Status: AC
  Administered 2021-06-20: 650 mg via ORAL

## 2021-06-20 NOTE — Progress Notes (Signed)
Patient waited 30 minute post hereptin sq with no issues

## 2021-06-20 NOTE — Patient Instructions (Signed)
Trastuzumab; Hyaluronidase injection What is this medication? TRASTUZUMAB; HYALURONIDASE (tras TOO zoo mab / hye al ur ON i dase) is used to treat breast cancer and stomach cancer. Trastuzumab is a monoclonal antibody.Hyaluronidase is used to improve the effects of trastuzumab. This medicine may be used for other purposes; ask your health care provider orpharmacist if you have questions. COMMON BRAND NAME(S): HERCEPTIN HYLECTA What should I tell my care team before I take this medication? They need to know if you have any of these conditions: heart disease heart failure lung or breathing disease, like asthma an unusual or allergic reaction to trastuzumab, or other medications, foods, dyes, or preservatives pregnant or trying to get pregnant breast-feeding How should I use this medication? This medicine is for injection under the skin. It is given by a health careprofessional in a hospital or clinic setting. Talk to your pediatrician regarding the use of this medicine in children. Thismedicine is not approved for use in children. Overdosage: If you think you have taken too much of this medicine contact apoison control center or emergency room at once. NOTE: This medicine is only for you. Do not share this medicine with others. What if I miss a dose? It is important not to miss a dose. Call your doctor or health careprofessional if you are unable to keep an appointment. What may interact with this medication? This medicine may interact with the following medications: certain types of chemotherapy, such as daunorubicin, doxorubicin, epirubicin, and idarubicin This list may not describe all possible interactions. Give your health care provider a list of all the medicines, herbs, non-prescription drugs, or dietary supplements you use. Also tell them if you smoke, drink alcohol, or use illegaldrugs. Some items may interact with your medicine. What should I watch for while using this  medication? Visit your doctor for checks on your progress. Report any side effects. Continue your course of treatment even though you feel ill unless your doctortells you to stop. Call your doctor or health care professional for advice if you get a fever, chills or sore throat, or other symptoms of a cold or flu. Do not treatyourself. Try to avoid being around people who are sick. You may experience fever, chills and shaking during your first infusion. These effects are usually mild and can be treated with other medicines. Report any side effects during the infusion to your health care professional. Fever andchills usually do not happen with later infusions. Do not become pregnant while taking this medicine or for 7 months after stopping it. Women should inform their doctor if they wish to become pregnant or think they might be pregnant. Women of child-bearing potential will need to have a negative pregnancy test before starting this medicine. There is a potential for serious side effects to an unborn child. Talk to your health care professional or pharmacist for more information. Do not breast-feed an infantwhile taking this medicine or for 7 months after stopping it. What side effects may I notice from receiving this medication? Side effects that you should report to your doctor or health care professionalas soon as possible: allergic reactions like skin rash, itching or hives, swelling of the face, lips, or tongue breathing problems chest pain or palpitations cough fever general ill feeling or flu-like symptoms signs of worsening heart failure like breathing problems; swelling in your legs and feet Side effects that usually do not require medical attention (report these toyour doctor or health care professional if they continue or are bothersome): bone pain changes   in taste diarrhea joint pain nausea/vomiting unusually weak or tired weight loss This list may not describe all possible side  effects. Call your doctor for medical advice about side effects. You may report side effects to FDA at1-800-FDA-1088. Where should I keep my medication? This drug is given in a hospital or clinic and will not be stored at home. NOTE: This sheet is a summary. It may not cover all possible information. If you have questions about this medicine, talk to your doctor, pharmacist, orhealth care provider.  2022 Elsevier/Gold Standard (2018-02-18 21:54:17)  

## 2021-06-20 NOTE — Progress Notes (Signed)
[facility]   Telephone:(336) [phone] Fax:(336) [phone]   Clinic Follow up Note   Patient Care Team: [name], MD as PCP - General (Family Medicine) Rockwell Germany, RN as Oncology Nurse Navigator Mauro Kaufmann, RN as Oncology Nurse Navigator Stark Klein, MD as Consulting Physician (General Surgery) Truitt Merle, MD as Consulting Physician (Hematology) Eppie Gibson, MD as Attending Physician (Radiation Oncology)  Date of Service:  06/20/2021  CHIEF COMPLAINT: f/u of left breast cancer  SUMMARY OF ONCOLOGIC HISTORY: Oncology History Overview Note  Cancer Staging Malignant neoplasm of upper-inner quadrant of left breast in female, estrogen receptor positive ([place]) Staging form: Breast, AJCC 8th Edition - Clinical stage from 01/14/2020: Stage IB (cT1c, cN1, cM0, G3, ER+, PR+, HER2+) - Signed by Truitt Merle, MD on 01/21/2021 Stage prefix: Initial diagnosis    Malignant neoplasm of upper-inner quadrant of left breast in female, estrogen receptor positive ([place])  01/14/2020 Cancer Staging   Staging form: Breast, AJCC 8th Edition - Clinical stage from 01/14/2020: Stage IB (cT1c, cN1, cM0, G3, ER+, PR+, HER2+) - Signed by Truitt Merle, MD on 01/21/2021  Stage prefix: Initial diagnosis    01/02/2021 Mammogram   Left breast with 1.9x1x1.8 cm complex cyst at 12:00 position, 4cmfn, versus saloid mass with calcifications, irregulr, increased in size. Biopsy recommended.    01/13/2021 Initial Biopsy   Diagnosis 1. Breast, left, needle core biopsy, 11 o'clock, 4cmfn - INVASIVE DUCTAL CARCINOMA - SEE COMMENT 2. Lymph node, needle/core biopsy, left axilla - FIBROVASCULAR AND ADIPOSE TISSUE - NO LYMPHOID TISSUE OR CARCINOMA IDENTIFIED Microscopic Comment 1. Based on the biopsy, the carcinoma appears Nottingham grade 3 of 3 and measures 1.2 cm in greatest linear extent. Prognostic markers (ER/PR/ki-67/HER2) are pending and will be reported in an addendum. Dr. Jeannie Done reviewed  the case and agrees with the above diagnosis. These results were called to [facility] on January 14, 2021   01/13/2021 Receptors her2   1. PROGNOSTIC INDICATORS Results: IMMUNOHISTOCHEMICAL AND MORPHOMETRIC ANALYSIS PERFORMED MANUALLY The tumor cells are POSITIVE for Her2 (3+). Estrogen Receptor: 95%, POSITIVE, STRONG STAINING INTENSITY Progesterone Receptor: 85%, POSITIVE, STRONG STAINING INTENSITY Proliferation Marker Ki67: 30%   01/16/2021 Initial Diagnosis   Malignant neoplasm of upper-inner quadrant of left breast in female, estrogen receptor positive ([place])    01/23/2021 Imaging   MRI Breast  IMPRESSION: 1. Known malignancy measuring 2.9 centimeters in the UPPER INNER QUADRANT of the LEFT breast. There is no evidence for involvement of the pectoralis muscle or implant. 2. No evidence for lymphadenopathy. 3. RIGHT breast is negative. 4. Bilateral retropectoral saline implants.   02/09/2021 Imaging   MRI Brain IMPRESSION: No evidence of intracranial metastatic disease.   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     02/13/2021 Surgery   LEFT BREAST LUMPECTOMY WITH RADIOACTIVE SEED AND SENTINEL LYMPH NODE BIOPSY WITH SEED TARGETED NODE and PAC Placed by Dr Barry Dienes   02/13/2021 Pathology Results   FINAL MICROSCOPIC DIAGNOSIS:   A. BREAST, LEFT, LUMPECTOMY:  - Invasive ductal carcinoma, 2.6 cm, Nottingham grade 3 of 3.  - Ductal carcinoma in situ, high grade with central necrosis.  - Margins of resection:       - Invasive carcinoma broadly involves the anterior and inferior  margins.  Invasive carcinoma is focally < 1 mm from each the superior  and medial margins.       - DCIS is focally < 1 mm from the inferior margin.  DCIS is focally  1 mm from  each the anterior and superior margins.  DCIS is focally 1-2  mm from the medial margin.  - Biopsy clip.  - See oncology table.   B. BREAST, LEFT, ANTERIOR TISSUE, EXCISION:  - Residual invasive and in-situ ductal  carcinoma.  - Residual invasive ductal carcinoma broadly involves the posterior  margin.  - Residual DCIS is focally 1 mm from the posterior margin.   C. BREAST, LEFT, INFERIOR MARGIN, EXCISION:  - No carcinoma identified.   D. SENTINEL LYMPH NODE, LEFT AXILLARY #1, BIOPSY:  - No carcinoma identified in one lymph node (0/1).   E. SENTINEL LYMPH NODE, LEFT AXILLARY #2, BIOPSY:  - Biopsy site.  - No distinct nodal tissue identified.  - No carcinoma identified.   F. SENTINEL LYMPH NODE, LEFT AXILLARY #3, BIOPSY:  - No carcinoma identified in one lymph node (0/1).   G. SENTINEL LYMPH NODE, LEFT AXILLARY #4, BIOPSY:  - No carcinoma identified in one lymph node (0/1).    02/13/2021 Cancer Staging   Staging form: Breast, AJCC 8th Edition - Pathologic stage from 02/13/2021: Stage IA (pT2, pN0, cM0, G3, ER+, PR+, HER2+) - Signed by Heilingoetter, Cassandra L, PA-C on 03/07/2021  Stage prefix: Initial diagnosis  Nuclear grade: G3  Histologic grading system: 3 grade system    03/18/2021 -  Chemotherapy    Patient is on Treatment Plan: BREAST PACLITAXEL + TRASTUZUMAB Q7D / TRASTUZUMAB Q21D          CURRENT THERAPY:  Maintenance herceptin q3 weeks to complete 1 year, starting 03/18/2021  INTERVAL HISTORY:  Aimee Mcclain is here for a follow up of breast cancer. She was last seen by me on 05/28/21. She presents to the clinic with her husband.  She is clinically doing well overall, her neuropathy has improved and hands, stable on her feet, her intermittent numbness on her face has much improved also.  She has numbness, and intermittent tingling on her feet.  No impact on her hand function or walking.  Her appetite remains to be normal, energy has improved lately, no other new complaints.  Weight is stable.   All other systems were reviewed with the patient and are negative.  MEDICAL HISTORY:  Past Medical History:  Diagnosis Date   Breast cancer [facility])    Cataract    Colon polyps     Endometriosis    Family history of colon cancer    Family history of colonic polyps    Fuchs' corneal dystrophy    GERD (gastroesophageal reflux disease)    H/O bilateral breast implants    History of colon polyps    HPV in female    HSV (herpes simplex virus) infection    Migraine    S/P TAH (total abdominal hysterectomy) 1991    SURGICAL HISTORY: Past Surgical History:  Procedure Laterality Date   ABDOMINAL HYSTERECTOMY     BREAST BIOPSY     Benign.   BREAST ENHANCEMENT SURGERY     BREAST LUMPECTOMY Bilateral 1993 and 1995   BREAST LUMPECTOMY WITH RADIOACTIVE SEED AND SENTINEL LYMPH NODE BIOPSY Left 02/13/2021   Procedure: LEFT BREAST LUMPECTOMY WITH RADIOACTIVE SEED AND SENTINEL LYMPH NODE BIOPSY WITH SEED TARGETED NODE;  Surgeon: Stark Klein, MD;  Location: [facility];  Service: General;  Laterality: Left;   CATARACT EXTRACTION, BILATERAL     CESAREAN SECTION     PARTIAL HYSTERECTOMY     PORTACATH PLACEMENT Right 02/13/2021   Procedure: INSERTION PORT-A-CATH;  Surgeon: Stark Klein, MD;  Location: [facility];  Service: General;  Laterality: Right;   TONSILLECTOMY     WISDOM TOOTH EXTRACTION      I have reviewed the social history and family history with the patient and they are unchanged from previous note.  ALLERGIES:  is allergic to ibuprofen, omnicef [cefdinir], proton pump inhibitors, roxicodone [oxycodone hcl], and vioxx [rofecoxib].  MEDICATIONS:  Current Outpatient Medications  Medication Sig Dispense Refill   acetaminophen (TYLENOL) 500 MG tablet Take 1,000 mg by mouth every 6 (six) hours as needed.     Calcium 600-200 MG-UNIT tablet Take 1 tablet by mouth daily.     calcium carbonate (TUMS - DOSED IN MG ELEMENTAL CALCIUM) 500 MG chewable tablet Chew 1 tablet by mouth as needed for indigestion or heartburn. Takes 5-6 per day     gabapentin (NEURONTIN) 100 MG capsule Take 1 capsule (100 mg total) by mouth 3 (three) times daily.  (Patient not taking: Reported on 06/19/2021) 60 capsule 0   lidocaine-prilocaine (EMLA) cream Apply 1 application topically as needed. 30 g 2   lisinopril (ZESTRIL) 5 MG tablet Take 1 tablet (5 mg total) by mouth daily. 90 tablet 3   Multiple Vitamin (MULTIVITAMIN) tablet Take 1 tablet by mouth daily.     omeprazole (PRILOSEC) 20 MG capsule Take 20 mg by mouth daily.     ondansetron (ZOFRAN) 8 MG tablet Take 1 tablet (8 mg total) by mouth every 8 (eight) hours as needed for nausea or vomiting. (Patient not taking: Reported on 06/19/2021) 30 tablet 2   prochlorperazine (COMPAZINE) 10 MG tablet Take 1 tablet (10 mg total) by mouth every 6 (six) hours as needed. (Patient not taking: Reported on 06/19/2021) 30 tablet 2   traMADol (ULTRAM) 50 MG tablet Take 1 tablet (50 mg total) by mouth every 6 (six) hours as needed. (Patient not taking: Reported on 06/19/2021) 10 tablet 0   valACYclovir (VALTREX) 500 MG tablet Take 1 tablet (500 mg total) by mouth 2 (two) times daily as needed. (Patient not taking: Reported on 06/19/2021)     Vitamin B Complex-C CAPS Take 1 capsule by mouth.     No current facility-administered medications for this visit.    PHYSICAL EXAMINATION: ECOG PERFORMANCE STATUS: 1 - Symptomatic but completely ambulatory  Vitals:   06/20/21 1241  BP: (!) 160/89  Pulse: 73  Resp: 15  Temp: 98.6 F (37 C)  SpO2: 100%   Filed Weights   06/20/21 1241  Weight: 205 lb 14.4 oz (93.4 kg)    GENERAL:alert, no distress and comfortable SKIN: skin color, texture, turgor are normal, no rashes or significant lesions EYES: normal, Conjunctiva are pink and non-injected, sclera clear Musculoskeletal:no cyanosis of digits and no clubbing  NEURO: alert & oriented x 3 with fluent speech, no focal motor/sensory deficits  LABORATORY DATA:  I have reviewed the data as listed CBC Latest Ref Rng & Units 06/20/2021 05/28/2021 05/21/2021  WBC 4.0 - 10.5 K/uL 6.1 3.7(L) 5.0  Hemoglobin 12.0 - 15.0 g/dL 13.0  11.3(L) 11.5(L)  Hematocrit 36.0 - 46.0 % 39.1 33.2(L) 33.6(L)  Platelets 150 - 400 K/uL 216 283 298     CMP Latest Ref Rng & Units 06/20/2021 05/28/2021 05/21/2021  Glucose 70 - 99 mg/dL 89 90 94  BUN 8 - 23 mg/dL 14 11 14   Creatinine 0.44 - 1.00 mg/dL 0.79 0.76 0.76  Sodium 135 - 145 mmol/L 143 142 145  Potassium 3.5 - 5.1 mmol/L 4.1 4.1 4.4  Chloride 98 -  111 mmol/L 108 110 109  CO2 22 - 32 mmol/L 26 25 26   Calcium 8.9 - 10.3 mg/dL 9.3 9.5 9.6  Total Protein 6.5 - 8.1 g/dL 7.1 6.7 7.0  Total Bilirubin 0.3 - 1.2 mg/dL 0.5 0.4 0.3  Alkaline Phos 38 - 126 U/L 101 93 94  AST 15 - 41 U/L 25 22 25   ALT 0 - 44 U/L 24 26 30       RADIOGRAPHIC STUDIES: I have personally reviewed the radiological images as listed and agreed with the findings in the report. No results found.   ASSESSMENT & PLAN:  Aimee Loose Klimowicz is a 65 y.o. female with   1. Malignant neoplasm of upper-inner quadrant of left breast, cT1cN1M0 stage Ib, ER+/PR+/HER2+, pT2N0 stage IA, grade 3 -Diagnosed 12/2020 s/p left lumpectomy and SLNB with Port-A-Cath placement 02/13/2021 by Dr. Barry Dienes -Adjuvant therapy with weekly Taxol and Herceptin followed by maintenance Herceptin every 3 weeks to complete 1 year was recommended to reduce her risk of recurrence. Given her tumor < 3 cm and negative lymph node, I did not recommend more intensive chemo. -Staging work-up showed small indeterminate lung nodules and an indeterminate left parietal bone lesion which will monitor with future scans, otherwise negative for distant metastasis -She has completed adjuvant Taxol/Abraxane, last 2 doses was canceled due to her worsening neuropathy. -She met with Dr. Isidore Moos on 06/18/21. Plan to receive radiation treatment 06/30/21 through 07/25/21. -Continue Herceptin every 3 weeks, for a total of 1 year treatment.  Her insurance has approved Herceptin injection. -Plan to start aromatase inhibitor after her radiation. -Follow-up in 6 weeks   2.   Peripheral neuropathy G1 -She has baseline neuropathy in her feet, etiology unknown -She is not diabetic, B12 level was normal  -No functional deficits -Due to neuropathy on her face, Abraxane discontinued after cycle 10. -I prescribed Neurontin 100 mg as needed on 05/28/21 -Neuropathy has improved overall, still moderate. -I discussed that the clinical trial ACCRU, she would like to wait and see how her neuropathy improves   3. Hot flashes -She was previously on HRT which she stopped with cancer diagnosis in 12/2020 -Hot flashes are mild and tolerable at this time -She was placed on gabapentin for neuropathy.   4. small indeterminate lung nodules and an indeterminate left parietal bone lesion on scan -unchanged left parietal bone lesion on brain MRI 05/29/21 -I reviewed her recent brain MRI and discussed the finding with patient.  Her left parietal bone lesion is unchanged, likely benign. -Plan to repeat CT head and CT chest in 3 months   5. Dry cough -Likely related to acid reflux -Much improved with Prilosec     Plan: -Brain MRI reviewed -Continue Herceptin injection every 3 weeks, will proceed today, will repeat labs every 3 treatments -She is scheduled to start radiation on July 18 -Follow-up in 6 weeks    No problem-specific Assessment & Plan notes found for this encounter.   No orders of the defined types were placed in this encounter.  All questions were answered. The patient knows to call the clinic with any problems, questions or concerns. No barriers to learning was detected. The total time spent in the appointment was 30 minutes.     Truitt Merle, MD 06/20/2021   I, Wilburn Mylar, am acting as scribe for Truitt Merle, MD.   I have reviewed the above documentation for accuracy and completeness, and I agree with the above.

## 2021-06-22 DIAGNOSIS — I10 Essential (primary) hypertension: Secondary | ICD-10-CM | POA: Insufficient documentation

## 2021-06-22 NOTE — Assessment & Plan Note (Signed)
Discussed options.  She can keep rechecking her blood pressure at home.  It is not all the way at goal today but it is improved here compared to other readings.  If her blood pressure is persistently elevated on home check that she can start lisinopril and update [place].  Routine ACE cautions given to patient.  She agrees with plan.

## 2021-06-24 ENCOUNTER — Telehealth: Payer: Self-pay | Admitting: Hematology

## 2021-06-24 LAB — HM DEXA SCAN

## 2021-06-24 NOTE — Telephone Encounter (Signed)
Scheduled follow-up appointments per 7/8 los. Patient is aware.

## 2021-06-25 ENCOUNTER — Encounter: Payer: Self-pay | Admitting: *Deleted

## 2021-06-26 ENCOUNTER — Encounter: Payer: Self-pay | Admitting: Hematology

## 2021-06-30 ENCOUNTER — Other Ambulatory Visit: Payer: Self-pay

## 2021-06-30 ENCOUNTER — Ambulatory Visit
Admission: RE | Admit: 2021-06-30 | Discharge: 2021-06-30 | Disposition: A | Discharge status: Home / Self Care | Payer: Medicare Other | Source: Ambulatory Visit | Attending: Radiation Oncology | Admitting: Radiation Oncology

## 2021-06-30 DIAGNOSIS — C50212 Malignant neoplasm of upper-inner quadrant of left female breast: Secondary | ICD-10-CM

## 2021-06-30 DIAGNOSIS — Z5111 Encounter for antineoplastic chemotherapy: Secondary | ICD-10-CM | POA: Diagnosis not present

## 2021-06-30 MED ORDER — ALRA NON-METALLIC DEODORANT (RAD-ONC)
1.0000 "application " | Freq: Once | TOPICAL | Status: AC
  Administered 2021-06-30: 1 via TOPICAL

## 2021-06-30 MED ORDER — RADIAPLEXRX EX GEL: Freq: Once | CUTANEOUS | Status: AC

## 2021-06-30 NOTE — Progress Notes (Signed)

## 2021-07-01 ENCOUNTER — Ambulatory Visit
Admission: RE | Admit: 2021-07-01 | Discharge: 2021-07-01 | Disposition: A | Discharge status: Home / Self Care | Payer: Medicare Other | Source: Ambulatory Visit | Attending: Radiation Oncology | Admitting: Radiation Oncology

## 2021-07-01 DIAGNOSIS — Z5111 Encounter for antineoplastic chemotherapy: Secondary | ICD-10-CM | POA: Diagnosis not present

## 2021-07-02 ENCOUNTER — Ambulatory Visit
Admission: RE | Admit: 2021-07-02 | Discharge: 2021-07-02 | Disposition: A | Discharge status: Home / Self Care | Payer: Medicare Other | Source: Ambulatory Visit | Attending: Radiation Oncology | Admitting: Radiation Oncology

## 2021-07-02 ENCOUNTER — Other Ambulatory Visit: Payer: Self-pay

## 2021-07-02 DIAGNOSIS — Z5111 Encounter for antineoplastic chemotherapy: Secondary | ICD-10-CM | POA: Diagnosis not present

## 2021-07-03 ENCOUNTER — Ambulatory Visit
Admission: RE | Admit: 2021-07-03 | Discharge: 2021-07-03 | Disposition: A | Discharge status: Home / Self Care | Payer: Medicare Other | Source: Ambulatory Visit | Attending: Radiation Oncology | Admitting: Radiation Oncology

## 2021-07-03 DIAGNOSIS — Z5111 Encounter for antineoplastic chemotherapy: Secondary | ICD-10-CM | POA: Diagnosis not present

## 2021-07-04 ENCOUNTER — Other Ambulatory Visit: Payer: Self-pay

## 2021-07-04 ENCOUNTER — Ambulatory Visit
Admission: RE | Admit: 2021-07-04 | Discharge: 2021-07-04 | Disposition: A | Discharge status: Home / Self Care | Payer: Medicare Other | Source: Ambulatory Visit | Attending: Radiation Oncology | Admitting: Radiation Oncology

## 2021-07-04 DIAGNOSIS — Z5111 Encounter for antineoplastic chemotherapy: Secondary | ICD-10-CM | POA: Diagnosis not present

## 2021-07-07 ENCOUNTER — Ambulatory Visit
Admission: RE | Admit: 2021-07-07 | Discharge: 2021-07-07 | Disposition: A | Discharge status: Home / Self Care | Payer: Medicare Other | Source: Ambulatory Visit | Attending: Radiation Oncology | Admitting: Radiation Oncology

## 2021-07-07 ENCOUNTER — Other Ambulatory Visit: Payer: Self-pay

## 2021-07-07 DIAGNOSIS — Z5111 Encounter for antineoplastic chemotherapy: Secondary | ICD-10-CM | POA: Diagnosis not present

## 2021-07-08 ENCOUNTER — Ambulatory Visit
Admission: RE | Admit: 2021-07-08 | Discharge: 2021-07-08 | Disposition: A | Discharge status: Home / Self Care | Payer: Medicare Other | Source: Ambulatory Visit | Attending: Radiation Oncology | Admitting: Radiation Oncology

## 2021-07-08 DIAGNOSIS — Z5111 Encounter for antineoplastic chemotherapy: Secondary | ICD-10-CM | POA: Diagnosis not present

## 2021-07-09 ENCOUNTER — Ambulatory Visit: Admission: RE | Admit: 2021-07-09 | Payer: Medicare Other | Source: Ambulatory Visit

## 2021-07-10 ENCOUNTER — Ambulatory Visit
Admission: RE | Admit: 2021-07-10 | Discharge: 2021-07-10 | Disposition: A | Discharge status: Home / Self Care | Payer: Medicare Other | Source: Ambulatory Visit | Attending: Radiation Oncology | Admitting: Radiation Oncology

## 2021-07-10 ENCOUNTER — Other Ambulatory Visit: Payer: Self-pay

## 2021-07-10 DIAGNOSIS — Z5111 Encounter for antineoplastic chemotherapy: Secondary | ICD-10-CM | POA: Diagnosis not present

## 2021-07-11 ENCOUNTER — Ambulatory Visit
Admission: RE | Admit: 2021-07-11 | Discharge: 2021-07-11 | Disposition: A | Discharge status: Home / Self Care | Payer: Medicare Other | Source: Ambulatory Visit | Attending: Radiation Oncology | Admitting: Radiation Oncology

## 2021-07-11 ENCOUNTER — Inpatient Hospital Stay: Payer: Medicare Other

## 2021-07-11 VITALS — BP 141/89 | HR 77 | Temp 98.2°F | Resp 16 | Wt 205.1 lb

## 2021-07-11 DIAGNOSIS — Z5111 Encounter for antineoplastic chemotherapy: Secondary | ICD-10-CM | POA: Diagnosis not present

## 2021-07-11 DIAGNOSIS — Z170 Estrogen receptor positive status [ER+]: Secondary | ICD-10-CM

## 2021-07-11 MED ORDER — DIPHENHYDRAMINE HCL 25 MG PO CAPS
50.0000 mg | ORAL_CAPSULE | Freq: Once | ORAL | Status: AC
  Administered 2021-07-11: 50 mg via ORAL

## 2021-07-11 MED ORDER — ACETAMINOPHEN 325 MG PO TABS
650.0000 mg | ORAL_TABLET | Freq: Once | ORAL | Status: AC
  Administered 2021-07-11: 650 mg via ORAL

## 2021-07-11 MED ORDER — ACETAMINOPHEN 325 MG PO TABS
ORAL_TABLET | ORAL | Status: AC
  Filled 2021-07-11: qty 2

## 2021-07-11 MED ORDER — TRASTUZUMAB-HYALURONIDASE-OYSK 600-10000 MG-UNT/5ML ~~LOC~~ SOLN
600.0000 mg | Freq: Once | SUBCUTANEOUS | Status: AC
  Administered 2021-07-11: 600 mg via SUBCUTANEOUS
  Filled 2021-07-11: qty 5

## 2021-07-11 MED ORDER — DIPHENHYDRAMINE HCL 25 MG PO CAPS
ORAL_CAPSULE | ORAL | Status: AC
  Filled 2021-07-11: qty 2

## 2021-07-11 NOTE — Patient Instructions (Signed)
[facility]  Discharge Instructions: Thank you for choosing [facility] to provide your oncology and hematology care.   If you have a lab appointment with the [facility], please go directly to the [facility] and check in at the registration area.   Wear comfortable clothing and clothing appropriate for easy access to any Portacath or PICC line.   We strive to give you quality time with your provider. You may need to reschedule your appointment if you arrive late (15 or more minutes).  Arriving late affects you and other patients whose appointments are after yours.  Also, if you miss three or more appointments without notifying the office, you may be dismissed from the clinic at the provider's discretion.      For prescription refill requests, have your pharmacy contact our office and allow 72 hours for refills to be completed.    Today you received the following chemotherapy and/or immunotherapy agents Trastuzumab-hyaluronidase-oysk SubQ Injection      To help prevent nausea and vomiting after your treatment, we encourage you to take your nausea medication as directed.  BELOW ARE SYMPTOMS THAT SHOULD BE REPORTED IMMEDIATELY: *FEVER GREATER THAN 100.4 F (38 C) OR HIGHER *CHILLS OR SWEATING *NAUSEA AND VOMITING THAT IS NOT CONTROLLED WITH YOUR NAUSEA MEDICATION *UNUSUAL SHORTNESS OF BREATH *UNUSUAL BRUISING OR BLEEDING *URINARY PROBLEMS (pain or burning when urinating, or frequent urination) *BOWEL PROBLEMS (unusual diarrhea, constipation, pain near the anus) TENDERNESS IN MOUTH AND THROAT WITH OR WITHOUT PRESENCE OF ULCERS (sore throat, sores in mouth, or a toothache) UNUSUAL RASH, SWELLING OR PAIN  UNUSUAL VAGINAL DISCHARGE OR ITCHING   Items with * indicate a potential emergency and should be followed up as soon as possible or go to the Emergency Department if any problems should occur.  Please show the CHEMOTHERAPY ALERT CARD or  IMMUNOTHERAPY ALERT CARD at check-in to the Emergency Department and triage nurse.  Should you have questions after your visit or need to cancel or reschedule your appointment, please contact [facility]  Dept: [phone]  and follow the prompts.  Office hours are 8:00 a.m. to 4:30 p.m. Monday - Friday. Please note that voicemails left after 4:00 p.m. may not be returned until the following business day.  We are closed weekends and major holidays. You have access to a nurse at all times for urgent questions. Please call the main number to the clinic Dept: [phone] and follow the prompts.   For any non-urgent questions, you may also contact your provider using MyChart. We now offer e-Visits for anyone 85 and older to request care online for non-urgent symptoms. For details visit [email or web address].   Also download the MyChart app! Go to the app store, search "MyChart", open the app, select [facility], and log in with your MyChart username and password.  Due to Covid, a mask is required upon entering the hospital/clinic. If you do not have a mask, one will be given to you upon arrival. For doctor visits, patients may have 1 support person aged 31 or older with them. For treatment visits, patients cannot have anyone with them due to current Covid guidelines and our immunocompromised population.

## 2021-07-11 NOTE — Progress Notes (Signed)
C6D1 of Trastuzumab-hyaluronidase-oysk SubQ Injection.  Shot administered in Right Thigh without incidence.  Bandaid applied site.

## 2021-07-14 ENCOUNTER — Other Ambulatory Visit: Payer: Self-pay

## 2021-07-14 ENCOUNTER — Ambulatory Visit: Payer: Medicare Other | Admitting: Radiation Oncology

## 2021-07-14 ENCOUNTER — Ambulatory Visit
Admission: RE | Admit: 2021-07-14 | Discharge: 2021-07-14 | Disposition: A | Discharge status: Home / Self Care | Payer: Medicare Other | Source: Ambulatory Visit | Attending: Radiation Oncology | Admitting: Radiation Oncology

## 2021-07-14 DIAGNOSIS — Z170 Estrogen receptor positive status [ER+]: Secondary | ICD-10-CM | POA: Insufficient documentation

## 2021-07-14 DIAGNOSIS — C50212 Malignant neoplasm of upper-inner quadrant of left female breast: Secondary | ICD-10-CM | POA: Diagnosis not present

## 2021-07-14 DIAGNOSIS — Z510 Encounter for antineoplastic radiation therapy: Secondary | ICD-10-CM | POA: Insufficient documentation

## 2021-07-15 ENCOUNTER — Ambulatory Visit: Payer: Medicare Other

## 2021-07-15 ENCOUNTER — Ambulatory Visit
Admission: RE | Admit: 2021-07-15 | Discharge: 2021-07-15 | Disposition: A | Discharge status: Home / Self Care | Payer: Medicare Other | Source: Ambulatory Visit | Attending: Radiation Oncology | Admitting: Radiation Oncology

## 2021-07-15 DIAGNOSIS — Z510 Encounter for antineoplastic radiation therapy: Secondary | ICD-10-CM | POA: Diagnosis not present

## 2021-07-16 ENCOUNTER — Ambulatory Visit
Admission: RE | Admit: 2021-07-16 | Discharge: 2021-07-16 | Disposition: A | Discharge status: Home / Self Care | Payer: Medicare Other | Source: Ambulatory Visit | Attending: Radiation Oncology | Admitting: Radiation Oncology

## 2021-07-16 ENCOUNTER — Other Ambulatory Visit: Payer: Self-pay

## 2021-07-16 DIAGNOSIS — Z510 Encounter for antineoplastic radiation therapy: Secondary | ICD-10-CM | POA: Diagnosis not present

## 2021-07-17 ENCOUNTER — Ambulatory Visit
Admission: RE | Admit: 2021-07-17 | Discharge: 2021-07-17 | Disposition: A | Discharge status: Home / Self Care | Payer: Medicare Other | Source: Ambulatory Visit | Attending: Radiation Oncology | Admitting: Radiation Oncology

## 2021-07-17 DIAGNOSIS — Z510 Encounter for antineoplastic radiation therapy: Secondary | ICD-10-CM | POA: Diagnosis not present

## 2021-07-18 ENCOUNTER — Ambulatory Visit
Admission: RE | Admit: 2021-07-18 | Discharge: 2021-07-18 | Disposition: A | Discharge status: Home / Self Care | Payer: Medicare Other | Source: Ambulatory Visit | Attending: Radiation Oncology | Admitting: Radiation Oncology

## 2021-07-18 DIAGNOSIS — Z510 Encounter for antineoplastic radiation therapy: Secondary | ICD-10-CM | POA: Diagnosis not present

## 2021-07-21 ENCOUNTER — Ambulatory Visit
Admission: RE | Admit: 2021-07-21 | Discharge: 2021-07-21 | Disposition: A | Discharge status: Home / Self Care | Payer: Medicare Other | Source: Ambulatory Visit | Attending: Radiation Oncology | Admitting: Radiation Oncology

## 2021-07-21 ENCOUNTER — Other Ambulatory Visit: Payer: Self-pay

## 2021-07-21 ENCOUNTER — Ambulatory Visit: Payer: Medicare Other

## 2021-07-21 DIAGNOSIS — Z510 Encounter for antineoplastic radiation therapy: Secondary | ICD-10-CM | POA: Diagnosis not present

## 2021-07-22 ENCOUNTER — Encounter: Payer: Self-pay | Admitting: *Deleted

## 2021-07-22 ENCOUNTER — Ambulatory Visit: Payer: Medicare Other

## 2021-07-22 ENCOUNTER — Ambulatory Visit
Admission: RE | Admit: 2021-07-22 | Discharge: 2021-07-22 | Disposition: A | Discharge status: Home / Self Care | Payer: Medicare Other | Source: Ambulatory Visit | Attending: Radiation Oncology | Admitting: Radiation Oncology

## 2021-07-22 DIAGNOSIS — Z510 Encounter for antineoplastic radiation therapy: Secondary | ICD-10-CM | POA: Diagnosis not present

## 2021-07-23 ENCOUNTER — Ambulatory Visit
Admission: RE | Admit: 2021-07-23 | Discharge: 2021-07-23 | Disposition: A | Discharge status: Home / Self Care | Payer: Medicare Other | Source: Ambulatory Visit | Attending: Radiation Oncology | Admitting: Radiation Oncology

## 2021-07-23 ENCOUNTER — Other Ambulatory Visit: Payer: Self-pay

## 2021-07-23 DIAGNOSIS — Z510 Encounter for antineoplastic radiation therapy: Secondary | ICD-10-CM | POA: Diagnosis not present

## 2021-07-24 ENCOUNTER — Ambulatory Visit
Admission: RE | Admit: 2021-07-24 | Discharge: 2021-07-24 | Disposition: A | Discharge status: Home / Self Care | Payer: Medicare Other | Source: Ambulatory Visit | Attending: Radiation Oncology | Admitting: Radiation Oncology

## 2021-07-24 DIAGNOSIS — Z510 Encounter for antineoplastic radiation therapy: Secondary | ICD-10-CM | POA: Diagnosis not present

## 2021-07-25 ENCOUNTER — Ambulatory Visit
Admission: RE | Admit: 2021-07-25 | Discharge: 2021-07-25 | Disposition: A | Discharge status: Home / Self Care | Payer: Medicare Other | Source: Ambulatory Visit | Attending: Radiation Oncology | Admitting: Radiation Oncology

## 2021-07-25 ENCOUNTER — Ambulatory Visit: Payer: Medicare Other

## 2021-07-25 ENCOUNTER — Other Ambulatory Visit: Payer: Self-pay

## 2021-07-25 DIAGNOSIS — Z510 Encounter for antineoplastic radiation therapy: Secondary | ICD-10-CM | POA: Diagnosis not present

## 2021-07-28 ENCOUNTER — Other Ambulatory Visit: Payer: Self-pay

## 2021-07-28 ENCOUNTER — Encounter: Payer: Self-pay | Admitting: Radiation Oncology

## 2021-07-28 ENCOUNTER — Ambulatory Visit
Admission: RE | Admit: 2021-07-28 | Discharge: 2021-07-28 | Disposition: A | Discharge status: Home / Self Care | Payer: Medicare Other | Source: Ambulatory Visit | Attending: Radiation Oncology | Admitting: Radiation Oncology

## 2021-07-28 DIAGNOSIS — Z510 Encounter for antineoplastic radiation therapy: Secondary | ICD-10-CM | POA: Diagnosis not present

## 2021-08-01 ENCOUNTER — Inpatient Hospital Stay: Payer: Medicare Other | Attending: Hematology

## 2021-08-01 ENCOUNTER — Inpatient Hospital Stay (HOSPITAL_BASED_OUTPATIENT_CLINIC_OR_DEPARTMENT_OTHER): Payer: Medicare Other | Admitting: Hematology

## 2021-08-01 ENCOUNTER — Other Ambulatory Visit: Payer: Self-pay

## 2021-08-01 ENCOUNTER — Encounter: Payer: Self-pay | Admitting: Hematology

## 2021-08-01 VITALS — BP 151/82 | HR 81 | Temp 98.4°F | Resp 18 | Wt 209.1 lb

## 2021-08-01 DIAGNOSIS — G629 Polyneuropathy, unspecified: Secondary | ICD-10-CM | POA: Insufficient documentation

## 2021-08-01 DIAGNOSIS — T451X5A Adverse effect of antineoplastic and immunosuppressive drugs, initial encounter: Secondary | ICD-10-CM

## 2021-08-01 DIAGNOSIS — Z79899 Other long term (current) drug therapy: Secondary | ICD-10-CM | POA: Insufficient documentation

## 2021-08-01 DIAGNOSIS — Z170 Estrogen receptor positive status [ER+]: Secondary | ICD-10-CM

## 2021-08-01 DIAGNOSIS — Z5112 Encounter for antineoplastic immunotherapy: Secondary | ICD-10-CM | POA: Diagnosis not present

## 2021-08-01 DIAGNOSIS — N951 Menopausal and female climacteric states: Secondary | ICD-10-CM | POA: Insufficient documentation

## 2021-08-01 DIAGNOSIS — Z5111 Encounter for antineoplastic chemotherapy: Secondary | ICD-10-CM

## 2021-08-01 DIAGNOSIS — C50212 Malignant neoplasm of upper-inner quadrant of left female breast: Secondary | ICD-10-CM | POA: Diagnosis present

## 2021-08-01 DIAGNOSIS — I427 Cardiomyopathy due to drug and external agent: Secondary | ICD-10-CM

## 2021-08-01 DIAGNOSIS — R918 Other nonspecific abnormal finding of lung field: Secondary | ICD-10-CM | POA: Diagnosis not present

## 2021-08-01 MED ORDER — ACETAMINOPHEN 325 MG PO TABS
650.0000 mg | ORAL_TABLET | Freq: Once | ORAL | Status: AC
  Administered 2021-08-01: 650 mg via ORAL
  Filled 2021-08-01: qty 2

## 2021-08-01 MED ORDER — TRASTUZUMAB-HYALURONIDASE-OYSK 600-10000 MG-UNT/5ML ~~LOC~~ SOLN
600.0000 mg | Freq: Once | SUBCUTANEOUS | Status: AC
  Administered 2021-08-01: 600 mg via SUBCUTANEOUS
  Filled 2021-08-01: qty 5

## 2021-08-01 MED ORDER — DIPHENHYDRAMINE HCL 25 MG PO CAPS
50.0000 mg | ORAL_CAPSULE | Freq: Once | ORAL | Status: AC
  Administered 2021-08-01: 50 mg via ORAL
  Filled 2021-08-01: qty 2

## 2021-08-01 NOTE — Patient Instructions (Signed)
[facility]  Discharge Instructions: Thank you for choosing [facility] to provide your oncology and hematology care.   If you have a lab appointment with the Cancer Center, please go directly to the Cancer Center and check in at the registration area.   Wear comfortable clothing and clothing appropriate for easy access to any Portacath or PICC line.   We strive to give you quality time with your provider. You may need to reschedule your appointment if you arrive late (15 or more minutes).  Arriving late affects you and other patients whose appointments are after yours.  Also, if you miss three or more appointments without notifying the office, you may be dismissed from the clinic at the provider's discretion.      For prescription refill requests, have your pharmacy contact our office and allow 72 hours for refills to be completed.    Today you received the following chemotherapy and/or immunotherapy agents: Herceptin Hylecta.     To help prevent nausea and vomiting after your treatment, we encourage you to take your nausea medication as directed.  BELOW ARE SYMPTOMS THAT SHOULD BE REPORTED IMMEDIATELY: . *FEVER GREATER THAN 100.4 F (38 C) OR HIGHER . *CHILLS OR SWEATING . *NAUSEA AND VOMITING THAT IS NOT CONTROLLED WITH YOUR NAUSEA MEDICATION . *UNUSUAL SHORTNESS OF BREATH . *UNUSUAL BRUISING OR BLEEDING . *URINARY PROBLEMS (pain or burning when urinating, or frequent urination) . *BOWEL PROBLEMS (unusual diarrhea, constipation, pain near the anus) . TENDERNESS IN MOUTH AND THROAT WITH OR WITHOUT PRESENCE OF ULCERS (sore throat, sores in mouth, or a toothache) . UNUSUAL RASH, SWELLING OR PAIN  . UNUSUAL VAGINAL DISCHARGE OR ITCHING   Items with * indicate a potential emergency and should be followed up as soon as possible or go to the Emergency Department if any problems should occur.  Please show the CHEMOTHERAPY ALERT CARD or  IMMUNOTHERAPY ALERT CARD at check-in to the Emergency Department and triage nurse.  Should you have questions after your visit or need to cancel or reschedule your appointment, please contact [facility]  Dept: [phone]  and follow the prompts.  Office hours are 8:00 a.m. to 4:30 p.m. Monday - Friday. Please note that voicemails left after 4:00 p.m. may not be returned until the following business day.  We are closed weekends and major holidays. You have access to a nurse at all times for urgent questions. Please call the main number to the clinic Dept: [phone] and follow the prompts.   For any non-urgent questions, you may also contact your provider using MyChart. We now offer e-Visits for anyone 18 and older to request care online for non-urgent symptoms. For details visit [email or web address].   Also download the MyChart app! Go to the app store, search "MyChart", open the app, select , and log in with your MyChart username and password.  Due to Covid, a mask is required upon entering the hospital/clinic. If you do not have a mask, one will be given to you upon arrival. For doctor visits, patients may have 1 support person aged 18 or older with them. For treatment visits, patients cannot have anyone with them due to current Covid guidelines and our immunocompromised population.   

## 2021-08-01 NOTE — Progress Notes (Signed)
[place]   Telephone:(336) [phone] Fax:([phone]) [phone]   Clinic Follow up Note   Patient Care Team: [name], MD as PCP - General (Family Medicine) Rockwell Germany, RN as Oncology Nurse Navigator Mauro Kaufmann, RN as Oncology Nurse Navigator Stark Klein, MD as Consulting Physician (General Surgery) Truitt Merle, MD as Consulting Physician (Hematology) Eppie Gibson, MD as Attending Physician (Radiation Oncology)  Date of Service:  08/01/2021  CHIEF COMPLAINT: f/u of left breast cancer  SUMMARY OF ONCOLOGIC HISTORY: Oncology History Overview Note  Cancer Staging Malignant neoplasm of upper-inner quadrant of left breast in female, estrogen receptor positive ([place]) Staging form: Breast, AJCC 8th Edition - Clinical stage from 01/14/2020: Stage IB (cT1c, cN1, cM0, G3, ER+, PR+, HER2+) - Signed by Truitt Merle, MD on 01/21/2021 Stage prefix: Initial diagnosis    Malignant neoplasm of upper-inner quadrant of left breast in female, estrogen receptor positive ([place])  01/14/2020 Cancer Staging   Staging form: Breast, AJCC 8th Edition - Clinical stage from 01/14/2020: Stage IB (cT1c, cN1, cM0, G3, ER+, PR+, HER2+) - Signed by Truitt Merle, MD on 01/21/2021 Stage prefix: Initial diagnosis   01/02/2021 Mammogram   Left breast with 1.9x1x1.8 cm complex cyst at 12:00 position, 4cmfn, versus saloid mass with calcifications, irregulr, increased in size. Biopsy recommended.    01/13/2021 Initial Biopsy   Diagnosis 1. Breast, left, needle core biopsy, 11 o'clock, 4cmfn - INVASIVE DUCTAL CARCINOMA - SEE COMMENT 2. Lymph node, needle/core biopsy, left axilla - FIBROVASCULAR AND ADIPOSE TISSUE - NO LYMPHOID TISSUE OR CARCINOMA IDENTIFIED Microscopic Comment 1. Based on the biopsy, the carcinoma appears Nottingham grade 3 of 3 and measures 1.2 cm in greatest linear extent. Prognostic markers (ER/PR/ki-67/HER2) are pending and will be reported in an addendum. Dr. Jeannie Done reviewed  the case and agrees with the above diagnosis. These results were called to [facility] on January 14, 2021   01/13/2021 Receptors her2   1. PROGNOSTIC INDICATORS Results: IMMUNOHISTOCHEMICAL AND MORPHOMETRIC ANALYSIS PERFORMED MANUALLY The tumor cells are POSITIVE for Her2 (3+). Estrogen Receptor: 95%, POSITIVE, STRONG STAINING INTENSITY Progesterone Receptor: 85%, POSITIVE, STRONG STAINING INTENSITY Proliferation Marker Ki67: 30%   01/16/2021 Initial Diagnosis   Malignant neoplasm of upper-inner quadrant of left breast in female, estrogen receptor positive ([place])   01/23/2021 Imaging   MRI Breast  IMPRESSION: 1. Known malignancy measuring 2.9 centimeters in the UPPER INNER QUADRANT of the LEFT breast. There is no evidence for involvement of the pectoralis muscle or implant. 2. No evidence for lymphadenopathy. 3. RIGHT breast is negative. 4. Bilateral retropectoral saline implants.   02/09/2021 Imaging   MRI Brain IMPRESSION: No evidence of intracranial metastatic disease.   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     02/13/2021 Surgery   LEFT BREAST LUMPECTOMY WITH RADIOACTIVE SEED AND SENTINEL LYMPH NODE BIOPSY WITH SEED TARGETED NODE and PAC Placed by Dr Barry Dienes   02/13/2021 Pathology Results   FINAL MICROSCOPIC DIAGNOSIS:   A. BREAST, LEFT, LUMPECTOMY:  - Invasive ductal carcinoma, 2.6 cm, Nottingham grade 3 of 3.  - Ductal carcinoma in situ, high grade with central necrosis.  - Margins of resection:       - Invasive carcinoma broadly involves the anterior and inferior  margins.  Invasive carcinoma is focally < 1 mm from each the superior  and medial margins.       - DCIS is focally < 1 mm from the inferior margin.  DCIS is focally  1 mm from each the anterior  and superior margins.  DCIS is focally 1-2  mm from the medial margin.  - Biopsy clip.  - See oncology table.   B. BREAST, LEFT, ANTERIOR TISSUE, EXCISION:  - Residual invasive and in-situ ductal  carcinoma.  - Residual invasive ductal carcinoma broadly involves the posterior  margin.  - Residual DCIS is focally 1 mm from the posterior margin.   C. BREAST, LEFT, INFERIOR MARGIN, EXCISION:  - No carcinoma identified.   D. SENTINEL LYMPH NODE, LEFT AXILLARY #1, BIOPSY:  - No carcinoma identified in one lymph node (0/1).   E. SENTINEL LYMPH NODE, LEFT AXILLARY #2, BIOPSY:  - Biopsy site.  - No distinct nodal tissue identified.  - No carcinoma identified.   F. SENTINEL LYMPH NODE, LEFT AXILLARY #3, BIOPSY:  - No carcinoma identified in one lymph node (0/1).   G. SENTINEL LYMPH NODE, LEFT AXILLARY #4, BIOPSY:  - No carcinoma identified in one lymph node (0/1).    02/13/2021 Cancer Staging   Staging form: Breast, AJCC 8th Edition - Pathologic stage from 02/13/2021: Stage IA (pT2, pN0, cM0, G3, ER+, PR+, HER2+) - Signed by Heilingoetter, Cassandra L, PA-C on 03/07/2021 Stage prefix: Initial diagnosis Nuclear grade: G3 Histologic grading system: 3 grade system   03/18/2021 -  Chemotherapy    Patient is on Treatment Plan: BREAST PACLITAXEL + TRASTUZUMAB Q7D / TRASTUZUMAB Q21D          CURRENT THERAPY:  Maintenance herceptin q3 weeks to complete 1 year, starting 03/18/2021  INTERVAL HISTORY:  Aimee Mcclain is here for a follow up of breast cancer. She was last seen by me on 06/20/21. She presents to the clinic accompanied by her husband. She reports she tolerated radiation therapy well with no skin issues. She notes her main complaint is tenderness.   All other systems were reviewed with the patient and are negative.  MEDICAL HISTORY:  Past Medical History:  Diagnosis Date   Breast cancer [facility])    Cataract    Colon polyps    Endometriosis    Family history of colon cancer    Family history of colonic polyps    Fuchs' corneal dystrophy    GERD (gastroesophageal reflux disease)    H/O bilateral breast implants    History of colon polyps    HPV in female    HSV  (herpes simplex virus) infection    Migraine    S/P TAH (total abdominal hysterectomy) 1991    SURGICAL HISTORY: Past Surgical History:  Procedure Laterality Date   ABDOMINAL HYSTERECTOMY     BREAST BIOPSY     Benign.   BREAST ENHANCEMENT SURGERY     BREAST LUMPECTOMY Bilateral 1993 and 1995   BREAST LUMPECTOMY WITH RADIOACTIVE SEED AND SENTINEL LYMPH NODE BIOPSY Left 02/13/2021   Procedure: LEFT BREAST LUMPECTOMY WITH RADIOACTIVE SEED AND SENTINEL LYMPH NODE BIOPSY WITH SEED TARGETED NODE;  Surgeon: Stark Klein, MD;  Location: [facility];  Service: General;  Laterality: Left;   CATARACT EXTRACTION, BILATERAL     CESAREAN SECTION     PARTIAL HYSTERECTOMY     PORTACATH PLACEMENT Right 02/13/2021   Procedure: INSERTION PORT-A-CATH;  Surgeon: Stark Klein, MD;  Location: [facility];  Service: General;  Laterality: Right;   TONSILLECTOMY     WISDOM TOOTH EXTRACTION      I have reviewed the social history and family history with the patient and they are unchanged from previous note.  ALLERGIES:  is allergic to ibuprofen,  omnicef [cefdinir], proton pump inhibitors, roxicodone [oxycodone hcl], and vioxx [rofecoxib].  MEDICATIONS:  Current Outpatient Medications  Medication Sig Dispense Refill   acetaminophen (TYLENOL) 500 MG tablet Take 1,000 mg by mouth every 6 (six) hours as needed.     Calcium 600-200 MG-UNIT tablet Take 1 tablet by mouth daily.     calcium carbonate (TUMS - DOSED IN MG ELEMENTAL CALCIUM) 500 MG chewable tablet Chew 1 tablet by mouth as needed for indigestion or heartburn. Takes 5-6 per day     gabapentin (NEURONTIN) 100 MG capsule Take 1 capsule (100 mg total) by mouth 3 (three) times daily. (Patient not taking: Reported on 06/19/2021) 60 capsule 0   lidocaine-prilocaine (EMLA) cream Apply 1 application topically as needed. 30 g 2   lisinopril (ZESTRIL) 5 MG tablet Take 1 tablet (5 mg total) by mouth daily. 90 tablet 3   Multiple Vitamin  (MULTIVITAMIN) tablet Take 1 tablet by mouth daily.     omeprazole (PRILOSEC) 20 MG capsule Take 20 mg by mouth daily.     ondansetron (ZOFRAN) 8 MG tablet Take 1 tablet (8 mg total) by mouth every 8 (eight) hours as needed for nausea or vomiting. (Patient not taking: Reported on 06/19/2021) 30 tablet 2   prochlorperazine (COMPAZINE) 10 MG tablet Take 1 tablet (10 mg total) by mouth every 6 (six) hours as needed. (Patient not taking: Reported on 06/19/2021) 30 tablet 2   traMADol (ULTRAM) 50 MG tablet Take 1 tablet (50 mg total) by mouth every 6 (six) hours as needed. (Patient not taking: Reported on 06/19/2021) 10 tablet 0   valACYclovir (VALTREX) 500 MG tablet Take 1 tablet (500 mg total) by mouth 2 (two) times daily as needed. (Patient not taking: Reported on 06/19/2021)     Vitamin B Complex-C CAPS Take 1 capsule by mouth.     No current facility-administered medications for this visit.    PHYSICAL EXAMINATION: ECOG PERFORMANCE STATUS: 1 - Symptomatic but completely ambulatory  Vitals:   08/01/21 1326  BP: (!) 151/82  Pulse: 81  Resp: 18  Temp: 98.4 F (36.9 C)  SpO2: 100%   Wt Readings from Last 3 Encounters:  08/01/21 209 lb 1.6 oz (94.8 kg)  07/11/21 205 lb 1.9 oz (93 kg)  06/20/21 205 lb 14.4 oz (93.4 kg)     GENERAL:alert, no distress and comfortable SKIN: skin color normal, no rashes or significant lesions EYES: normal, Conjunctiva are pink and non-injected, sclera clear  NEURO: alert & oriented x 3 with fluent speech  LABORATORY DATA:  I have reviewed the data as listed CBC Latest Ref Rng & Units 06/20/2021 05/28/2021 05/21/2021  WBC 4.0 - 10.5 K/uL 6.1 3.7(L) 5.0  Hemoglobin 12.0 - 15.0 g/dL 13.0 11.3(L) 11.5(L)  Hematocrit 36.0 - 46.0 % 39.1 33.2(L) 33.6(L)  Platelets 150 - 400 K/uL 216 283 298     CMP Latest Ref Rng & Units 06/20/2021 05/28/2021 05/21/2021  Glucose 70 - 99 mg/dL 89 90 94  BUN 8 - 23 mg/dL _0 Creatinine 0.44 - 1.00 mg/dL 0.79 0.76 0.76  Sodium 135 -  145 mmol/L 143 142 145  Potassium 3.5 - 5.1 mmol/L 4.1 4.1 4.4  Chloride 98 - 111 mmol/L 108 110 109  CO2 22 - 32 mmol/L _1 Calcium 8.9 - 10.3 mg/dL 9.3 9.5 9.6  Total Protein 6.5 - 8.1 g/dL 7.1 6.7 7.0  Total Bilirubin 0.3 - 1.2 mg/dL 0.5 0.4 0.3  Alkaline Phos 38 - 126  U/L 101 93 94  AST 15 - 41 U/L _0 ALT 0 - 44 U/L _1 RADIOGRAPHIC STUDIES: I have personally reviewed the radiological images as listed and agreed with the findings in the report. No results found.   ASSESSMENT & PLAN:  Aimee Loose Haacke is a 65 y.o. female with   1. Malignant neoplasm of upper-inner quadrant of left breast, cT1cN1M0 stage Ib, ER+/PR+/HER2+, pT2N0 stage IA, grade 3 -Diagnosed 12/2020 s/p left lumpectomy and SLNB with Port-A-Cath placement 02/13/21 by Dr. Barry Dienes -Adjuvant therapy with weekly Taxol and Herceptin followed by maintenance Herceptin every 3 weeks to complete 1 year was recommended to reduce her risk of recurrence. Given her tumor < 3 cm and negative lymph node, I did not recommend more intensive chemo. -Staging work-up showed small indeterminate lung nodules and an indeterminate left parietal bone lesion which will monitor with future scans, otherwise negative for distant metastasis -She completed 10 of 12 doses of adjuvant Taxol/Abraxane on 05/21/21, last 2 doses was canceled due to her worsening neuropathy. -She received radiation treatment 06/30/21 through 07/25/21 under Dr. Isidore Moos. -Continue Herceptin every 3 weeks, for a total of 1 year treatment.  Her insurance has approved Herceptin injection. -She will be due for repeat echo on 08/22/21. -Plan to start aromatase inhibitor on her next visit  -Follow-up in 3 weeks   2.  Peripheral neuropathy G1 -She has baseline neuropathy in her feet, etiology unknown -She is not diabetic, B12 level was normal  -No functional deficits -Due to neuropathy on her face, Abraxane discontinued after cycle 10. -I prescribed Neurontin  100 mg as needed on 05/28/21 -Neuropathy has improved overall, still moderate. -I discussed that the clinical trial ACCRU, she would like to wait and see how her neuropathy improves   3. Hot flashes -She was previously on HRT which she stopped with cancer diagnosis in 12/2020 -Hot flashes are mild and tolerable at this time -She was placed on gabapentin for neuropathy.   4. small indeterminate lung nodules and an indeterminate left parietal bone lesion on scan -unchanged left parietal bone lesion on brain MRI 05/29/21 -Plan to repeat CT head and CT chest around 08/20/21      Plan: -Continue Herceptin injection every 3 weeks, will proceed today, will repeat labs every 3 treatments -Follow-up in 3 weeks, with repeat head CT and chest CT, and echo a few days prior -plan to start antiestrogen therapy after next visit     No problem-specific Assessment & Plan notes found for this encounter.   Orders Placed This Encounter  Procedures   CT Chest Wo Contrast    Standing Status:   Future    Standing Expiration Date:   08/01/2022    Order Specific Question:   Preferred imaging location?    Answer:   [facility]   CT HEAD WO CONTRAST (5MM)    Abnormal  left parietal bone lesion on previous MRI, f/u    Standing Status:   Future    Standing Expiration Date:   08/01/2022    Order Specific Question:   Preferred imaging location?    Answer:   [facility]    Order Specific Question:   Release to patient    Answer:   Immediate   ECHOCARDIOGRAM COMPLETE    Pt on Herception, f/u, rule our cardiomyopathy    Standing Status:   Future    Standing Expiration Date:   08/01/2022  Order Specific Question:   Where should this test be performed    Answer:   [facility]    Order Specific Question:   Perflutren DEFINITY (image enhancing agent) should be administered unless hypersensitivity or allergy exist    Answer:   Administer Perflutren    Order Specific Question:   Is a special  reader required? (athlete or structural heart)    Answer:   No    Order Specific Question:   Does this study need to be read by the Structural team/Level 3 readers?    Answer:   No    Order Specific Question:   Reason for exam-Echo    Answer:   Cardiomegaly  I51.7    All questions were answered. The patient knows to call the clinic with any problems, questions or concerns. No barriers to learning was detected. The total time spent in the appointment was 30 minutes.     Truitt Merle, MD 08/01/2021   I, Wilburn Mylar, am acting as scribe for Truitt Merle, MD.   I have reviewed the above documentation for accuracy and completeness, and I agree with the above.

## 2021-08-04 ENCOUNTER — Telehealth: Payer: Self-pay | Admitting: Hematology

## 2021-08-04 NOTE — Telephone Encounter (Signed)
Scheduled follow-up appointment per 8/19 los. Patient is aware.

## 2021-08-06 ENCOUNTER — Other Ambulatory Visit: Payer: Self-pay

## 2021-08-06 ENCOUNTER — Ambulatory Visit (HOSPITAL_COMMUNITY)
Admission: RE | Admit: 2021-08-06 | Discharge: 2021-08-06 | Disposition: A | Discharge status: Home / Self Care | Payer: Medicare Other | Source: Ambulatory Visit | Attending: Hematology | Admitting: Hematology

## 2021-08-06 DIAGNOSIS — Z0189 Encounter for other specified special examinations: Secondary | ICD-10-CM | POA: Diagnosis not present

## 2021-08-06 DIAGNOSIS — C50919 Malignant neoplasm of unspecified site of unspecified female breast: Secondary | ICD-10-CM | POA: Insufficient documentation

## 2021-08-06 DIAGNOSIS — T451X5A Adverse effect of antineoplastic and immunosuppressive drugs, initial encounter: Secondary | ICD-10-CM | POA: Diagnosis not present

## 2021-08-06 DIAGNOSIS — I427 Cardiomyopathy due to drug and external agent: Secondary | ICD-10-CM

## 2021-08-06 DIAGNOSIS — Z923 Personal history of irradiation: Secondary | ICD-10-CM | POA: Diagnosis not present

## 2021-08-06 DIAGNOSIS — Z9221 Personal history of antineoplastic chemotherapy: Secondary | ICD-10-CM | POA: Insufficient documentation

## 2021-08-06 LAB — ECHOCARDIOGRAM COMPLETE
Area-P 1/2: 4.44 cm2
S' Lateral: 3.1 cm

## 2021-08-06 NOTE — Progress Notes (Signed)
  Echocardiogram 2D Echocardiogram has been performed.  Aimee Mcclain 08/06/2021, 10:42 AM

## 2021-08-20 ENCOUNTER — Encounter: Payer: Self-pay | Admitting: Hematology

## 2021-08-20 ENCOUNTER — Other Ambulatory Visit: Payer: Self-pay

## 2021-08-20 ENCOUNTER — Ambulatory Visit (HOSPITAL_COMMUNITY)
Admission: RE | Admit: 2021-08-20 | Discharge: 2021-08-20 | Disposition: A | Discharge status: Home / Self Care | Payer: Medicare Other | Source: Ambulatory Visit | Attending: Hematology | Admitting: Hematology

## 2021-08-20 DIAGNOSIS — C50212 Malignant neoplasm of upper-inner quadrant of left female breast: Secondary | ICD-10-CM | POA: Insufficient documentation

## 2021-08-20 DIAGNOSIS — Z170 Estrogen receptor positive status [ER+]: Secondary | ICD-10-CM | POA: Insufficient documentation

## 2021-08-20 NOTE — Progress Notes (Signed)
                                                                                                                                                             Patient Name: Aimee Mcclain MRN: [id number] DOB: May 09, 1956 Referring Physician: Truitt Merle (Profile Not Attached) Date of Service: 07/28/2021 [facility], [place]                                                        End Of Treatment Note  Diagnoses: C50.212-Malignant neoplasm of upper-inner quadrant of left female breast  Cancer Staging: Cancer Staging Malignant neoplasm of upper-inner quadrant of left breast in female, estrogen receptor positive ([place]) Staging form: Breast, AJCC 8th Edition - Clinical stage from 01/14/2020: Stage IB (cT1c, cN1, cM0, G3, ER+, PR+, HER2+) - Signed by Truitt Merle, MD on 01/21/2021 Stage prefix: Initial diagnosis - Pathologic stage from 02/13/2021: Stage IA (pT2, pN0, cM0, G3, ER+, PR+, HER2+) - Signed by Heilingoetter, Cassandra L, PA-C on 03/07/2021 Stage prefix: Initial diagnosis Nuclear grade: G3 Histologic grading system: 3 grade system  Intent: Curative  Radiation Treatment Dates: 06/30/2021 through 07/28/2021 Site Technique Total Dose (Gy) Dose per Fx (Gy) Completed Fx Beam Energies  Breast, Left: Breast_Lt 3D 40.05/40.05 2.67 15/15 10X  Breast, Left: Breast_Lt_Bst 3D 10/10 2 5/5 6X, 10X   Narrative: The patient tolerated radiation therapy relatively well.   Plan: The patient will follow-up with radiation oncology in 78mo, or as needed.  -----------------------------------  Eppie Gibson, MD

## 2021-08-21 ENCOUNTER — Encounter: Payer: Self-pay | Admitting: *Deleted

## 2021-08-22 ENCOUNTER — Inpatient Hospital Stay: Payer: Medicare Other | Attending: Hematology

## 2021-08-22 ENCOUNTER — Other Ambulatory Visit: Payer: Self-pay

## 2021-08-22 ENCOUNTER — Ambulatory Visit: Payer: Medicare Other

## 2021-08-22 ENCOUNTER — Inpatient Hospital Stay (HOSPITAL_BASED_OUTPATIENT_CLINIC_OR_DEPARTMENT_OTHER): Payer: Medicare Other | Admitting: Hematology

## 2021-08-22 ENCOUNTER — Other Ambulatory Visit: Payer: Medicare Other

## 2021-08-22 ENCOUNTER — Inpatient Hospital Stay: Payer: Medicare Other

## 2021-08-22 VITALS — BP 128/67 | HR 77 | Temp 98.5°F | Resp 17 | Ht 67.0 in | Wt 200.0 lb

## 2021-08-22 DIAGNOSIS — Z79899 Other long term (current) drug therapy: Secondary | ICD-10-CM | POA: Insufficient documentation

## 2021-08-22 DIAGNOSIS — C50212 Malignant neoplasm of upper-inner quadrant of left female breast: Secondary | ICD-10-CM

## 2021-08-22 DIAGNOSIS — Z95828 Presence of other vascular implants and grafts: Secondary | ICD-10-CM | POA: Diagnosis not present

## 2021-08-22 DIAGNOSIS — Z5112 Encounter for antineoplastic immunotherapy: Secondary | ICD-10-CM | POA: Diagnosis present

## 2021-08-22 DIAGNOSIS — Z170 Estrogen receptor positive status [ER+]: Secondary | ICD-10-CM | POA: Insufficient documentation

## 2021-08-22 DIAGNOSIS — G629 Polyneuropathy, unspecified: Secondary | ICD-10-CM | POA: Insufficient documentation

## 2021-08-22 LAB — CBC WITH DIFFERENTIAL (CANCER CENTER ONLY)
Abs Immature Granulocytes: 0 10*3/uL (ref 0.00–0.07)
Basophils Absolute: 0 10*3/uL (ref 0.0–0.1)
Basophils Relative: 1 %
Eosinophils Absolute: 0.1 10*3/uL (ref 0.0–0.5)
Eosinophils Relative: 2 %
HCT: 41.5 % (ref 36.0–46.0)
Hemoglobin: 14.2 g/dL (ref 12.0–15.0)
Immature Granulocytes: 0 %
Lymphocytes Relative: 30 %
Lymphs Abs: 1.3 10*3/uL (ref 0.7–4.0)
MCH: 29.6 pg (ref 26.0–34.0)
MCHC: 34.2 g/dL (ref 30.0–36.0)
MCV: 86.5 fL (ref 80.0–100.0)
Monocytes Absolute: 0.4 10*3/uL (ref 0.1–1.0)
Monocytes Relative: 9 %
Neutro Abs: 2.4 10*3/uL (ref 1.7–7.7)
Neutrophils Relative %: 58 %
Platelet Count: 170 10*3/uL (ref 150–400)
RBC: 4.8 MIL/uL (ref 3.87–5.11)
RDW: 12.5 % (ref 11.5–15.5)
WBC Count: 4.2 10*3/uL (ref 4.0–10.5)
nRBC: 0 % (ref 0.0–0.2)

## 2021-08-22 LAB — CMP (CANCER CENTER ONLY)
ALT: 26 U/L (ref 0–44)
AST: 30 U/L (ref 15–41)
Albumin: 4.4 g/dL (ref 3.5–5.0)
Alkaline Phosphatase: 86 U/L (ref 38–126)
Anion gap: 11 (ref 5–15)
BUN: 17 mg/dL (ref 8–23)
CO2: 23 mmol/L (ref 22–32)
Calcium: 10.1 mg/dL (ref 8.9–10.3)
Chloride: 107 mmol/L (ref 98–111)
Creatinine: 0.96 mg/dL (ref 0.44–1.00)
GFR, Estimated: >60 mL/min (ref >60)
Glucose, Bld: 88 mg/dL (ref 70–99)
Potassium: 4.2 mmol/L (ref 3.5–5.1)
Sodium: 141 mmol/L (ref 135–145)
Total Bilirubin: 0.4 mg/dL (ref 0.3–1.2)
Total Protein: 7.3 g/dL (ref 6.5–8.1)

## 2021-08-22 MED ORDER — ACETAMINOPHEN 325 MG PO TABS
650.0000 mg | ORAL_TABLET | Freq: Once | ORAL | Status: AC
  Administered 2021-08-22: 650 mg via ORAL
  Filled 2021-08-22: qty 2

## 2021-08-22 MED ORDER — HEPARIN SOD (PORK) LOCK FLUSH 100 UNIT/ML IV SOLN
500.0000 [IU] | Freq: Once | INTRAVENOUS | Status: AC
  Administered 2021-08-22: 500 [IU]

## 2021-08-22 MED ORDER — DIPHENHYDRAMINE HCL 25 MG PO CAPS
50.0000 mg | ORAL_CAPSULE | Freq: Once | ORAL | Status: AC
  Administered 2021-08-22: 50 mg via ORAL
  Filled 2021-08-22: qty 2

## 2021-08-22 MED ORDER — SODIUM CHLORIDE 0.9% FLUSH
10.0000 mL | Freq: Once | INTRAVENOUS | Status: AC
  Administered 2021-08-22: 10 mL

## 2021-08-22 MED ORDER — TRASTUZUMAB-HYALURONIDASE-OYSK 600-10000 MG-UNT/5ML ~~LOC~~ SOLN
600.0000 mg | Freq: Once | SUBCUTANEOUS | Status: AC
  Administered 2021-08-22: 600 mg via SUBCUTANEOUS
  Filled 2021-08-22: qty 5

## 2021-08-22 MED ORDER — EXEMESTANE 25 MG PO TABS: 25.0000 mg | ORAL_TABLET | Freq: Every day | ORAL | 3 refills | Status: DC

## 2021-08-22 NOTE — Patient Instructions (Signed)
[facility]  Discharge Instructions: Thank you for choosing [facility] to provide your oncology and hematology care.   If you have a lab appointment with the Cancer Center, please go directly to the Cancer Center and check in at the registration area.   Wear comfortable clothing and clothing appropriate for easy access to any Portacath or PICC line.   We strive to give you quality time with your provider. You may need to reschedule your appointment if you arrive late (15 or more minutes).  Arriving late affects you and other patients whose appointments are after yours.  Also, if you miss three or more appointments without notifying the office, you may be dismissed from the clinic at the provider's discretion.      For prescription refill requests, have your pharmacy contact our office and allow 72 hours for refills to be completed.    Today you received the following chemotherapy and/or immunotherapy agents: Herceptin Hylecta.     To help prevent nausea and vomiting after your treatment, we encourage you to take your nausea medication as directed.  BELOW ARE SYMPTOMS THAT SHOULD BE REPORTED IMMEDIATELY: . *FEVER GREATER THAN 100.4 F (38 C) OR HIGHER . *CHILLS OR SWEATING . *NAUSEA AND VOMITING THAT IS NOT CONTROLLED WITH YOUR NAUSEA MEDICATION . *UNUSUAL SHORTNESS OF BREATH . *UNUSUAL BRUISING OR BLEEDING . *URINARY PROBLEMS (pain or burning when urinating, or frequent urination) . *BOWEL PROBLEMS (unusual diarrhea, constipation, pain near the anus) . TENDERNESS IN MOUTH AND THROAT WITH OR WITHOUT PRESENCE OF ULCERS (sore throat, sores in mouth, or a toothache) . UNUSUAL RASH, SWELLING OR PAIN  . UNUSUAL VAGINAL DISCHARGE OR ITCHING   Items with * indicate a potential emergency and should be followed up as soon as possible or go to the Emergency Department if any problems should occur.  Please show the CHEMOTHERAPY ALERT CARD or  IMMUNOTHERAPY ALERT CARD at check-in to the Emergency Department and triage nurse.  Should you have questions after your visit or need to cancel or reschedule your appointment, please contact [facility]  Dept: [phone]  and follow the prompts.  Office hours are 8:00 a.m. to 4:30 p.m. Monday - Friday. Please note that voicemails left after 4:00 p.m. may not be returned until the following business day.  We are closed weekends and major holidays. You have access to a nurse at all times for urgent questions. Please call the main number to the clinic Dept: [phone] and follow the prompts.   For any non-urgent questions, you may also contact your provider using MyChart. We now offer e-Visits for anyone 18 and older to request care online for non-urgent symptoms. For details visit [email or web address].   Also download the MyChart app! Go to the app store, search "MyChart", open the app, select [facility], and log in with your MyChart username and password.  Due to Covid, a mask is required upon entering the hospital/clinic. If you do not have a mask, one will be given to you upon arrival. For doctor visits, patients may have 1 support person aged 18 or older with them. For treatment visits, patients cannot have anyone with them due to current Covid guidelines and our immunocompromised population.   

## 2021-08-22 NOTE — Progress Notes (Signed)
[facility]   [phone] [phone]   Clinic Follow up Note   Patient Care Team: [name], MD as PCP - General (Family Medicine) Rockwell Germany, RN as Oncology Nurse Navigator Mauro Kaufmann, RN as Oncology Nurse Navigator Stark Klein, MD as Consulting Physician (General Surgery) Truitt Merle, MD as Consulting Physician (Hematology) Eppie Gibson, MD as Attending Physician (Radiation Oncology)  Date of Service:  08/22/2021  CHIEF COMPLAINT: f/u of left breast cancer  CURRENT THERAPY:  Maintenance herceptin q3 weeks to complete 1 year, starting 03/18/2021 Pending Exemestane   ASSESSMENT & PLAN:  MERDIS Mcclain Aimee is a 65 y.o. female with   1. Malignant neoplasm of upper-inner quadrant of left breast, cT1cN1M0 stage Ib, ER+/PR+/HER2+, pT2N0 stage IA, grade 3 -Diagnosed 12/2020 s/p left lumpectomy and SLNB with Port-A-Cath placement 02/13/21 by Dr. Barry Dienes -Adjuvant therapy with weekly Taxol and Herceptin followed by maintenance Herceptin every 3 weeks to complete 1 year was recommended to reduce her risk of recurrence.  -Staging work-up showed small indeterminate lung nodules and an indeterminate left parietal bone lesion which will monitor with future scans, otherwise negative for distant metastasis -She completed 10 of 12 doses of adjuvant Taxol/Abraxane on 05/21/21, last 2 doses was canceled due to her worsening neuropathy. -She received radiation treatment 06/30/21 through 07/25/21 under Dr. Isidore Moos. -Given the strong ER and PR positivity, I do recommend adjuvant aromatase inhibitor to reduce her risk of cancer recurrence,  The potential benefit and side effects, which includes but not limited to, hot flash, skin and vaginal dryness, metabolic changes ( increased blood glucose, cholesterol, weight, etc.), slightly in increased risk of cardiovascular disease, cataracts, muscular and joint discomfort, osteopenia and osteoporosis, etc, were discussed  with her in great details. She is interested. I will prescribe exemestane today for her to start this week or next. -Continue Herceptin every 3 weeks, for a total of 1 year treatment until March 2023.  Her insurance has approved Herceptin injection. -most recent echo on 08/06/21 was normal, will repeat every 3 months  -she will f/u in survivorship clinic in 3 months  2. Bone health -DEXA 06/24/21 showed osteopenia, with T-score of -1.3.   2.  Peripheral neuropathy G1 -She has baseline neuropathy in her feet, etiology unknown -She is not diabetic, B12 level was normal  -No functional deficits -Due to neuropathy on her face, Abraxane discontinued after cycle 10. -I prescribed Neurontin 100 mg as needed on 05/28/21 -Neuropathy has improved overall, still moderate.   3. Hot flashes -She was previously on HRT which she stopped with cancer diagnosis in 12/2020 -Hot flashes are mild and tolerable at this time -She was placed on gabapentin for neuropathy.   4. Small indeterminate lung nodules and left parietal bone lesion -seen on initial staging work up -unchanged left parietal bone lesion on brain MRI 05/29/21, non-aggressive appearance on head CT 08/20/21, this is likely benign, no f/u scan needed  -one 5 mm RLL nodule on chest CT 08/20/21, previous small lung nodule resolved. Likely benign. I reviewed and discussed with pt  -will repeat CT chest one more time in 6-12 months      Plan: -Continue Herceptin injection every 3 weeks, will proceed today, will repeat labs every 3 treatments -start exemestane next week, I called in today  -survivorship in 3 months   No problem-specific Assessment & Plan notes found for this encounter.   SUMMARY OF ONCOLOGIC HISTORY: Oncology History Overview Note  Cancer Staging Malignant neoplasm  of upper-inner quadrant of left breast in female, estrogen receptor positive (HCC) Staging form: Breast, AJCC 8th Edition - Clinical stage from 01/14/2020: Stage IB  (cT1c, cN1, cM0, G3, ER+, PR+, HER2+) - Signed by Malachy Mood, MD on 01/21/2021 Stage prefix: Initial diagnosis    Malignant neoplasm of upper-inner quadrant of left breast in female, estrogen receptor positive (HCC)  01/14/2020 Cancer Staging   Staging form: Breast, AJCC 8th Edition - Clinical stage from 01/14/2020: Stage IB (cT1c, cN1, cM0, G3, ER+, PR+, HER2+) - Signed by Malachy Mood, MD on 01/21/2021 Stage prefix: Initial diagnosis   01/02/2021 Mammogram   Left breast with 1.9x1x1.8 cm complex cyst at 12:00 position, 4cmfn, versus saloid mass with calcifications, irregulr, increased in size. Biopsy recommended.    01/13/2021 Initial Biopsy   Diagnosis 1. Breast, left, needle core biopsy, 11 o'clock, 4cmfn - INVASIVE DUCTAL CARCINOMA - SEE COMMENT 2. Lymph node, needle/core biopsy, left axilla - FIBROVASCULAR AND ADIPOSE TISSUE - NO LYMPHOID TISSUE OR CARCINOMA IDENTIFIED Microscopic Comment 1. Based on the biopsy, the carcinoma appears Nottingham grade 3 of 3 and measures 1.2 cm in greatest linear extent. Prognostic markers (ER/PR/ki-67/HER2) are pending and will be reported in an addendum. Dr. Rayetta Pigg reviewed the case and agrees with the above diagnosis. These results were called to [facility] on January 14, 2021   01/13/2021 Receptors her2   1. PROGNOSTIC INDICATORS Results: IMMUNOHISTOCHEMICAL AND MORPHOMETRIC ANALYSIS PERFORMED MANUALLY The tumor cells are POSITIVE for Her2 (3+). Estrogen Receptor: 95%, POSITIVE, STRONG STAINING INTENSITY Progesterone Receptor: 85%, POSITIVE, STRONG STAINING INTENSITY Proliferation Marker Ki67: 30%   01/16/2021 Initial Diagnosis   Malignant neoplasm of upper-inner quadrant of left breast in female, estrogen receptor positive (HCC)   01/23/2021 Imaging   MRI Breast  IMPRESSION: 1. Known malignancy measuring 2.9 centimeters in the UPPER INNER QUADRANT of the LEFT breast. There is no evidence for involvement of the pectoralis muscle or  implant. 2. No evidence for lymphadenopathy. 3. RIGHT breast is negative. 4. Bilateral retropectoral saline implants.   02/09/2021 Imaging   MRI Brain IMPRESSION: No evidence of intracranial metastatic disease.   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     02/13/2021 Surgery   LEFT BREAST LUMPECTOMY WITH RADIOACTIVE SEED AND SENTINEL LYMPH NODE BIOPSY WITH SEED TARGETED NODE and PAC Placed by Dr Donell Beers   02/13/2021 Pathology Results   FINAL MICROSCOPIC DIAGNOSIS:   A. BREAST, LEFT, LUMPECTOMY:  - Invasive ductal carcinoma, 2.6 cm, Nottingham grade 3 of 3.  - Ductal carcinoma in situ, high grade with central necrosis.  - Margins of resection:       - Invasive carcinoma broadly involves the anterior and inferior  margins.  Invasive carcinoma is focally < 1 mm from each the superior  and medial margins.       - DCIS is focally < 1 mm from the inferior margin.  DCIS is focally  1 mm from each the anterior and superior margins.  DCIS is focally 1-2  mm from the medial margin.  - Biopsy clip.  - See oncology table.   B. BREAST, LEFT, ANTERIOR TISSUE, EXCISION:  - Residual invasive and in-situ ductal carcinoma.  - Residual invasive ductal carcinoma broadly involves the posterior  margin.  - Residual DCIS is focally 1 mm from the posterior margin.   C. BREAST, LEFT, INFERIOR MARGIN, EXCISION:  - No carcinoma identified.   D. SENTINEL LYMPH NODE, LEFT AXILLARY #1, BIOPSY:  - No carcinoma identified in one lymph node (0/1).  E. SENTINEL LYMPH NODE, LEFT AXILLARY #2, BIOPSY:  - Biopsy site.  - No distinct nodal tissue identified.  - No carcinoma identified.   F. SENTINEL LYMPH NODE, LEFT AXILLARY #3, BIOPSY:  - No carcinoma identified in one lymph node (0/1).   G. SENTINEL LYMPH NODE, LEFT AXILLARY #4, BIOPSY:  - No carcinoma identified in one lymph node (0/1).    02/13/2021 Cancer Staging   Staging form: Breast, AJCC 8th Edition - Pathologic stage from 02/13/2021: Stage IA  (pT2, pN0, cM0, G3, ER+, PR+, HER2+) - Signed by Heilingoetter, Cassandra L, PA-C on 03/07/2021 Stage prefix: Initial diagnosis Nuclear grade: G3 Histologic grading system: 3 grade system   03/18/2021 -  Chemotherapy    Patient is on Treatment Plan: BREAST PACLITAXEL + TRASTUZUMAB Q7D / TRASTUZUMAB Q21D          INTERVAL HISTORY:  Aimee Mcclain is here for a follow up of breast cancer. She was last seen by me on 08/01/21. She presents to the clinic accompanied by her husband. She reports she is slowly improving since being off chemo. She notes some residual neuropathy in her feet.   All other systems were reviewed with the patient and are negative.  MEDICAL HISTORY:  Past Medical History:  Diagnosis Date   Breast cancer [facility])    Cataract    Colon polyps    Endometriosis    Family history of colon cancer    Family history of colonic polyps    Fuchs' corneal dystrophy    GERD (gastroesophageal reflux disease)    H/O bilateral breast implants    History of colon polyps    HPV in female    HSV (herpes simplex virus) infection    Migraine    S/P TAH (total abdominal hysterectomy) 1991    SURGICAL HISTORY: Past Surgical History:  Procedure Laterality Date   ABDOMINAL HYSTERECTOMY     BREAST BIOPSY     Benign.   BREAST ENHANCEMENT SURGERY     BREAST LUMPECTOMY Bilateral 1993 and 1995   BREAST LUMPECTOMY WITH RADIOACTIVE SEED AND SENTINEL LYMPH NODE BIOPSY Left 02/13/2021   Procedure: LEFT BREAST LUMPECTOMY WITH RADIOACTIVE SEED AND SENTINEL LYMPH NODE BIOPSY WITH SEED TARGETED NODE;  Surgeon: Stark Klein, MD;  Location: [place];  Service: General;  Laterality: Left;   CATARACT EXTRACTION, BILATERAL     CESAREAN SECTION     PARTIAL HYSTERECTOMY     PORTACATH PLACEMENT Right 02/13/2021   Procedure: INSERTION PORT-A-CATH;  Surgeon: Stark Klein, MD;  Location: [place];  Service: General;  Laterality: Right;   TONSILLECTOMY     WISDOM  TOOTH EXTRACTION      I have reviewed the social history and family history with the patient and they are unchanged from previous note.  ALLERGIES:  is allergic to ibuprofen, omnicef [cefdinir], proton pump inhibitors, roxicodone [oxycodone hcl], and vioxx [rofecoxib].  MEDICATIONS:  Current Outpatient Medications  Medication Sig Dispense Refill   acetaminophen (TYLENOL) 500 MG tablet Take 1,000 mg by mouth every 6 (six) hours as needed.     Calcium 600-200 MG-UNIT tablet Take 1 tablet by mouth daily.     calcium carbonate (TUMS - DOSED IN MG ELEMENTAL CALCIUM) 500 MG chewable tablet Chew 1 tablet by mouth as needed for indigestion or heartburn. Takes 5-6 per day     gabapentin (NEURONTIN) 100 MG capsule Take 1 capsule (100 mg total) by mouth 3 (three) times daily. (Patient not taking: Reported on 06/19/2021)  60 capsule 0   lidocaine-prilocaine (EMLA) cream Apply 1 application topically as needed. 30 g 2   lisinopril (ZESTRIL) 5 MG tablet Take 1 tablet (5 mg total) by mouth daily. 90 tablet 3   Multiple Vitamin (MULTIVITAMIN) tablet Take 1 tablet by mouth daily.     omeprazole (PRILOSEC) 20 MG capsule Take 20 mg by mouth daily.     ondansetron (ZOFRAN) 8 MG tablet Take 1 tablet (8 mg total) by mouth every 8 (eight) hours as needed for nausea or vomiting. (Patient not taking: Reported on 06/19/2021) 30 tablet 2   prochlorperazine (COMPAZINE) 10 MG tablet Take 1 tablet (10 mg total) by mouth every 6 (six) hours as needed. (Patient not taking: Reported on 06/19/2021) 30 tablet 2   traMADol (ULTRAM) 50 MG tablet Take 1 tablet (50 mg total) by mouth every 6 (six) hours as needed. (Patient not taking: Reported on 06/19/2021) 10 tablet 0   valACYclovir (VALTREX) 500 MG tablet Take 1 tablet (500 mg total) by mouth 2 (two) times daily as needed. (Patient not taking: Reported on 06/19/2021)     Vitamin B Complex-C CAPS Take 1 capsule by mouth.     No current facility-administered medications for this visit.     PHYSICAL EXAMINATION: ECOG PERFORMANCE STATUS: 0 - Asymptomatic  There were no vitals filed for this visit. Wt Readings from Last 3 Encounters:  08/01/21 209 lb 1.6 oz (94.8 kg)  07/11/21 205 lb 1.9 oz (93 kg)  06/20/21 205 lb 14.4 oz (93.4 kg)     GENERAL:alert, no distress and comfortable SKIN: skin color normal, no rashes or significant lesions EYES: normal, Conjunctiva are pink and non-injected, sclera clear  NEURO: alert & oriented x 3 with fluent speech  LABORATORY DATA:  I have reviewed the data as listed CBC Latest Ref Rng & Units 06/20/2021 05/28/2021 05/21/2021  WBC 4.0 - 10.5 K/uL 6.1 3.7(L) 5.0  Hemoglobin 12.0 - 15.0 g/dL 13.0 11.3(L) 11.5(L)  Hematocrit 36.0 - 46.0 % 39.1 33.2(L) 33.6(L)  Platelets 150 - 400 K/uL 216 283 298     CMP Latest Ref Rng & Units 06/20/2021 05/28/2021 05/21/2021  Glucose 70 - 99 mg/dL 89 90 94  BUN 8 - 23 mg/dL $Remove'14 11 14  'mfQbhdg$ Creatinine 0.44 - 1.00 mg/dL 0.79 0.76 0.76  Sodium 135 - 145 mmol/L 143 142 145  Potassium 3.5 - 5.1 mmol/L 4.1 4.1 4.4  Chloride 98 - 111 mmol/L 108 110 109  CO2 22 - 32 mmol/L $RemoveB'26 25 26  'NWicjfKL$ Calcium 8.9 - 10.3 mg/dL 9.3 9.5 9.6  Total Protein 6.5 - 8.1 g/dL 7.1 6.7 7.0  Total Bilirubin 0.3 - 1.2 mg/dL 0.5 0.4 0.3  Alkaline Phos 38 - 126 U/L 101 93 94  AST 15 - 41 U/L $Remo'25 22 25  'KPxKo$ ALT 0 - 44 U/L $Remo'24 26 30      'DVVNR$ RADIOGRAPHIC STUDIES: I have personally reviewed the radiological images as listed and agreed with the findings in the report. No results found.    No orders of the defined types were placed in this encounter.  All questions were answered. The patient knows to call the clinic with any problems, questions or concerns. No barriers to learning was detected. The total time spent in the appointment was 30 minutes.     Truitt Merle, MD 08/22/2021   I, Wilburn Mylar, am acting as scribe for Truitt Merle, MD.   I have reviewed the above documentation for accuracy and completeness, and I agree with the  above.     

## 2021-08-23 ENCOUNTER — Encounter: Payer: Self-pay | Admitting: Hematology

## 2021-08-25 ENCOUNTER — Telehealth: Payer: Self-pay | Admitting: Hematology

## 2021-08-25 ENCOUNTER — Ambulatory Visit: Payer: Medicare Other

## 2021-08-25 NOTE — Telephone Encounter (Signed)
Scheduled follow-up appointments per 9/9 los. Patient is aware.

## 2021-08-27 ENCOUNTER — Other Ambulatory Visit: Payer: Self-pay

## 2021-08-27 ENCOUNTER — Ambulatory Visit: Payer: Medicare Other | Attending: General Surgery

## 2021-08-27 ENCOUNTER — Other Ambulatory Visit: Payer: Self-pay | Admitting: *Deleted

## 2021-08-27 VITALS — Wt 195.5 lb

## 2021-08-27 DIAGNOSIS — C50212 Malignant neoplasm of upper-inner quadrant of left female breast: Secondary | ICD-10-CM | POA: Insufficient documentation

## 2021-08-27 DIAGNOSIS — Z170 Estrogen receptor positive status [ER+]: Secondary | ICD-10-CM | POA: Insufficient documentation

## 2021-08-27 DIAGNOSIS — Z483 Aftercare following surgery for neoplasm: Secondary | ICD-10-CM | POA: Insufficient documentation

## 2021-08-27 DIAGNOSIS — R293 Abnormal posture: Secondary | ICD-10-CM | POA: Insufficient documentation

## 2021-08-27 NOTE — Therapy (Signed)
[facility] [address] [place], [place], [address] Phone: [phone]   Fax:  [phone]  Physical Therapy Treatment  Patient Details  Name: Aimee Mcclain MRN: [id number] Date of Birth: 12/11/1956 Referring Provider (PT): Dr. Faera Byerly   Encounter Date: 08/27/2021   PT End of Session - 08/27/21 0832     Visit Number 2   # unchanged due to screen only   PT Start Time 0815    PT Stop Time 0829    PT Time Calculation (min) 14 min    Activity Tolerance Patient tolerated treatment well    Behavior During Therapy WFL for tasks assessed/performed             Past Medical History:  Diagnosis Date   Breast cancer (HCC)    Cataract    Colon polyps    Endometriosis    Family history of colon cancer    Family history of colonic polyps    Fuchs' corneal dystrophy    GERD (gastroesophageal reflux disease)    H/O bilateral breast implants    History of colon polyps    HPV in female    HSV (herpes simplex virus) infection    Migraine    S/P TAH (total abdominal hysterectomy) 1991    Past Surgical History:  Procedure Laterality Date   ABDOMINAL HYSTERECTOMY     BREAST BIOPSY     Benign.   BREAST ENHANCEMENT SURGERY     BREAST LUMPECTOMY Bilateral 1993 and 1995   BREAST LUMPECTOMY WITH RADIOACTIVE SEED AND SENTINEL LYMPH NODE BIOPSY Left 02/13/2021   Procedure: LEFT BREAST LUMPECTOMY WITH RADIOACTIVE SEED AND SENTINEL LYMPH NODE BIOPSY WITH SEED TARGETED NODE;  Surgeon: Byerly, Faera, MD;  Location: [facility];  Service: General;  Laterality: Left;   CATARACT EXTRACTION, BILATERAL     CESAREAN SECTION     PARTIAL HYSTERECTOMY     PORTACATH PLACEMENT Right 02/13/2021   Procedure: INSERTION PORT-A-CATH;  Surgeon: Byerly, Faera, MD;  [facility];  Service: General;  Laterality: Right;   TONSILLECTOMY     WISDOM TOOTH EXTRACTION      Vitals:   08/27/21 0813  Weight: 195 lb  8 oz (88.7 kg)     Subjective Assessment - 08/27/21 0819     Subjective Pt returns for her 3 month L-Dex screen. My Lt inferior breast has been firm and now looks like an orange peel during raidation but has gotten worse since completing this last month and it has me worried.    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 Ki67 is 30%. Patient underwent a left lumpectomy and sentinel node biopsy (4 negative nodes) on 02/13/2021.                    L-DEX FLOWSHEETS - 08/27/21 0800       L-DEX LYMPHEDEMA SCREENING   Measurement Type Unilateral    L-DEX MEASUREMENT EXTREMITY Upper Extremity    POSITION  Standing    DOMINANT SIDE Right    At Risk Side Left    BASELINE SCORE (UNILATERAL) 2.6    L-DEX SCORE (UNILATERAL) -3.4    VALUE CHANGE (UNILAT) -6                                     PT Long Term   Goals - 03/10/21 1444       PT LONG TERM GOAL #1   Title Patient will demonstrate she has regained full shoulder ROM and function post operatively compared to baselines.    Time 6    Period Months    Status Achieved                   Plan - 08/27/21 0833     Clinical Impression Statement Pt returns for her 3 month L-Dex screen. Her change from baseline of -6 is WNLs so no further treatment regarding this required however she is c/o peau de l'orange and firbrosis at the inferior aspect of her Lt breast that has worsened since completing radiation last month. Request for referral sent to Ke[facility] Martini and pt scheduled an evaluation for this before leaving today along with her next 3 month screen.    PT Next Visit Plan Eval for Lt breast lymphedema and try to begin instruction of MLD at first session if time; continue SOZO screens every 3 months    Consulted and Agree with Plan of Care Patient             Patient will benefit from skilled therapeutic  intervention in order to improve the following deficits and impairments:     Visit Diagnosis: Aftercare following surgery for neoplasm     Problem List Patient Active Problem List   Diagnosis Date Noted   HTN (hypertension) 06/22/2021   Herpes simplex 06/19/2021   Osteopenia 06/19/2021   Medicare welcome exam 05/21/2021   Port-A-Cath in place 03/18/2021   Malignant neoplasm of upper-inner quadrant of left breast in female, 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   Chronic migraine 08/21/2020   Screening mammogram, encounter for 06/30/2020   Paresthesia 06/09/2020   Neuropathy 02/14/2020   Atypical chest pain 02/14/2020   Healthcare maintenance 02/14/2020   Migraine    Advance care planning 01/16/2019   Family history of colon cancer    HPV in female     Rosenberger, Valerie Ann, PTA 08/27/2021, 8:36 AM  [facility] [address] [place], [place], [address] Phone: [phone]   Fax:  [phone]  Name: Aimee Mcclain MRN: [id number] Date of Birth: 08/17/1956    

## 2021-08-28 ENCOUNTER — Encounter: Payer: Self-pay | Admitting: *Deleted

## 2021-08-28 ENCOUNTER — Telehealth: Payer: Self-pay

## 2021-08-28 NOTE — Telephone Encounter (Signed)
I called the patient today about her upcoming follow-up appointment in radiation oncology.   Given the state of the COVID-19 pandemic, concerning case numbers in our community, and guidance from [facility], I offered a phone assessment with the patient to determine if coming to the clinic was necessary. She accepted.  The patient denies any symptomatic concerns.  She reports her fatigue has almost completely resolved, and she has resumed her usual hobbies/activities. Reports continued occasional pain/tenderness to breast, but states it has significantly improved compared to what it was after her lumpectomy. She plans to continue intermittent follow-up with PT to learn self-massage techniques to ensure swelling in breast remains controlled. She denies any range of motion concerns and reports good healing of her skin in the radiation fields.  Skin is intact and back to baseline color/texture. She reports she never had any issues with blistering or peeling after completing radiation treatment. I recommended that she continue skin care by applying oil or lotion with vitamin E to the skin in the radiation fields, BID, for 2 more months.    Continue follow-up with medical oncology - follow-up is scheduled on 11/13/2021 with [name] Burton-NP in the [place] (she'll continue to receive trastuzumab injections and reports she has started her exemestane prescription in the interim).  I explained that yearly mammograms are important for patients with intact breast tissue, and physical exams are important after mastectomy for patients that cannot undergo mammography.  I encouraged her to call if she had further questions or concerns about her healing. Otherwise, she will follow-up PRN in radiation oncology. Patient expressed appreciation of care received by Dr. Isidore Moos and her team, and is pleased with this plan. We will cancel her upcoming follow-up to reduce the risk of COVID-19  transmission.

## 2021-08-29 ENCOUNTER — Ambulatory Visit: Payer: Medicare Other | Admitting: Radiation Oncology

## 2021-09-10 ENCOUNTER — Encounter: Payer: Self-pay | Admitting: Rehabilitation

## 2021-09-10 ENCOUNTER — Ambulatory Visit: Payer: Medicare Other | Admitting: Rehabilitation

## 2021-09-10 ENCOUNTER — Other Ambulatory Visit: Payer: Self-pay

## 2021-09-10 DIAGNOSIS — Z483 Aftercare following surgery for neoplasm: Secondary | ICD-10-CM | POA: Diagnosis not present

## 2021-09-10 DIAGNOSIS — Z170 Estrogen receptor positive status [ER+]: Secondary | ICD-10-CM | POA: Diagnosis present

## 2021-09-10 DIAGNOSIS — R293 Abnormal posture: Secondary | ICD-10-CM

## 2021-09-10 DIAGNOSIS — C50212 Malignant neoplasm of upper-inner quadrant of left female breast: Secondary | ICD-10-CM

## 2021-09-10 NOTE — Therapy (Signed)
[place], [place], [address] Phone: [phone]   Fax:  [phone]  Physical Therapy Evaluation  Patient Details  Name: Aimee Mcclain MRN: [id number] Date of Birth: 04/11/1956 Referring Provider (PT): Dr. Stark Klein   Encounter Date: 09/10/2021   PT End of Session - 09/10/21 0936     Visit Number 3    Number of Visits 3    Date for PT Re-Evaluation 09/10/21    PT Start Time 0900    PT Stop Time 0934    PT Time Calculation (min) 34 min    Activity Tolerance Patient tolerated treatment well    Behavior During Therapy [facility] for tasks assessed/performed             Past Medical History:  Diagnosis Date   Breast cancer ([place])    Cataract    Colon polyps    Endometriosis    Family history of colon cancer    Family history of colonic polyps    Fuchs' corneal dystrophy    GERD (gastroesophageal reflux disease)    H/O bilateral breast implants    History of colon polyps    HPV in female    HSV (herpes simplex virus) infection    Migraine    S/P TAH (total abdominal hysterectomy) 1991    Past Surgical History:  Procedure Laterality Date   ABDOMINAL HYSTERECTOMY     BREAST BIOPSY     Benign.   BREAST ENHANCEMENT SURGERY     BREAST LUMPECTOMY Bilateral 1993 and 1995   BREAST LUMPECTOMY WITH RADIOACTIVE SEED AND SENTINEL LYMPH NODE BIOPSY Left 02/13/2021   Procedure: LEFT BREAST LUMPECTOMY WITH RADIOACTIVE SEED AND SENTINEL LYMPH NODE BIOPSY WITH SEED TARGETED NODE;  Surgeon: Stark Klein, MD;  Location: [facility];  Service: General;  Laterality: Left;   CATARACT EXTRACTION, BILATERAL     CESAREAN SECTION     PARTIAL HYSTERECTOMY     PORTACATH PLACEMENT Right 02/13/2021   Procedure: INSERTION PORT-A-CATH;  Surgeon: Stark Klein, MD;  Location: [facility];  Service: General;  Laterality: Right;   TONSILLECTOMY     WISDOM TOOTH EXTRACTION      There were  no vitals filed for this visit.    Subjective Assessment - 09/10/21 0856     Subjective I am noticing some breast swelling now not as bad as the last time I was here.    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 Ki67 is 30%. Patient underwent a left lumpectomy and sentinel node biopsy (4 negative nodes) on 02/13/2021. Radiation complete 07/28/21    Currently in Pain? Yes   no but it was tender               [facility] PT Assessment - 09/10/21 0001       Assessment   Medical Diagnosis s/p left lumpectomy and SLNB    Referring Provider (PT) Dr. Stark Klein    Onset Date/Surgical Date 02/13/21    Hand Dominance Right    Prior Therapy Baselines      Precautions   Precautions Other (comment)    Precaution Comments left lymphedema      Restrictions   Weight Bearing Restrictions No      Balance Screen   Has the patient fallen in the past 6 months No    Has the patient had a decrease in activity level because of  a fear of falling?  No    Is the patient reluctant to leave their home because of a fear of falling?  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      Palpation   Palpation comment no pitting today noted pt reporting she gets it intermittently in the medial breast                        Objective measurements completed on examination: See  above findings.       [facility] Adult PT Treatment/Exercise - 09/10/21 0001       Manual Therapy   Manual Therapy Edema management;Manual Lymphatic Drainage (MLD)    Manual therapy comments made small rectangle chip pack for use in bra if needed    Edema Management pt wearing an appropriate sports bra - no compression bra needed    Manual Lymphatic Drainage (MLD) PT performed and instructed pt on MLD for the left breast with handout.  Performed each step with vcs, tcs, and hand over hand placement for completion.  Then reviewed again in seated.  clavicular nodes, 5 deep breaths, bil axillary nodes, left inguinal nodes, anterior interaxillary pathway, axillo inguinal pathway, then left breast and then reversing steps                     PT Education - 09/10/21 0935     Education Details MLD reasons and technique, left breast self MLD, use of chip pack    Person(s) Educated Patient    Methods Explanation;Demonstration;Tactile cues;Verbal cues;Handout    Comprehension Verbalized understanding;Returned demonstration;Verbal cues required;Tactile cues required              PT Short Term Goals - 09/10/21 0940       PT SHORT TERM GOAL #1   Title Pt will be ind with self MLD for the left breast    Time 1    Period Days    Status Achieved               PT Long Term Goals - 03/10/21 1444       PT LONG TERM GOAL #1   Title Patient will demonstrate she has regained full shoulder ROM and function post operatively compared to baselines.    Time 6    Period Months    Status Achieved                    Plan - 09/10/21 6144     Clinical Impression Statement Pt had noticed some peau d'orange skin and firmness in the medial inferior breast as well as general edema post radiation that is already improving.  The breast had no pitting edema or fibrosis on palpation and no significant soft edema.  No axillary congestion noted.  Pt has a good bra.  Pt was  educated on  self MLD and was given a chip pack if the fibrosis and edema returns and knows that she can return if needed.  Good understanding of MLD upon leaving.    Personal Factors and Comorbidities Comorbidity 2    Comorbidities SLNB, radiation    Examination-Activity Limitations --   none   Examination-Participation Restrictions --   none   Stability/Clinical Decision Making Stable/Uncomplicated    Clinical Decision Making Low    Rehab Potential Excellent    PT Frequency One time visit    PT Treatment/Interventions ADLs/Self Care Home Management;Patient/family education;Manual lymph drainage    PT Next Visit Plan continue SOZO    Consulted and Agree with Plan of Care Patient             Patient will benefit from skilled therapeutic intervention in order to improve the following deficits and impairments:  Increased edema  Visit Diagnosis: Malignant neoplasm of upper-inner quadrant of left breast in female, estrogen receptor positive ([facility])  Aftercare following surgery for neoplasm  Abnormal posture     Problem List Patient Active Problem List   Diagnosis Date Noted   HTN (hypertension) 06/22/2021   Herpes simplex 06/19/2021   Osteopenia 06/19/2021   Medicare welcome exam 05/21/2021   Port-A-Cath in place 03/18/2021   Malignant neoplasm of upper-inner quadrant of left breast in female, estrogen receptor positive ([facility]) 01/16/2021   Chronic migraine 08/21/2020   Screening mammogram, encounter for 06/30/2020   Paresthesia 06/09/2020   Neuropathy 02/14/2020   Atypical chest pain 02/14/2020   Healthcare maintenance 02/14/2020   Migraine    Advance care planning 01/16/2019   Family history of colon cancer    HPV in female     Stark Bray, PT 09/10/2021, 9:41 AM  [place], [place], [address] Phone: [phone]   Fax:  [phone]  Name: Aimee Mcclain MRN: [id number] Date of Birth:  1956-08-20

## 2021-09-10 NOTE — Patient Instructions (Signed)
Access Code: [email or web address]: [email or web address]: 09/28/2022Prepared by: Ola Spurr Education  MLD Left Breast

## 2021-09-11 ENCOUNTER — Telehealth: Payer: Self-pay | Admitting: Nurse Practitioner

## 2021-09-11 NOTE — Telephone Encounter (Signed)
Scheduled appointment per 09/28 los. Patient is aware.

## 2021-09-12 ENCOUNTER — Other Ambulatory Visit: Payer: Self-pay

## 2021-09-12 ENCOUNTER — Inpatient Hospital Stay: Payer: Medicare Other

## 2021-09-12 VITALS — BP 135/86 | HR 70 | Temp 98.3°F | Resp 16 | Wt 194.2 lb

## 2021-09-12 DIAGNOSIS — C50212 Malignant neoplasm of upper-inner quadrant of left female breast: Secondary | ICD-10-CM

## 2021-09-12 DIAGNOSIS — Z5112 Encounter for antineoplastic immunotherapy: Secondary | ICD-10-CM | POA: Diagnosis not present

## 2021-09-12 MED ORDER — DIPHENHYDRAMINE HCL 25 MG PO CAPS
50.0000 mg | ORAL_CAPSULE | Freq: Once | ORAL | Status: AC
  Administered 2021-09-12: 50 mg via ORAL
  Filled 2021-09-12: qty 2

## 2021-09-12 MED ORDER — TRASTUZUMAB-HYALURONIDASE-OYSK 600-10000 MG-UNT/5ML ~~LOC~~ SOLN
600.0000 mg | Freq: Once | SUBCUTANEOUS | Status: AC
  Administered 2021-09-12: 600 mg via SUBCUTANEOUS
  Filled 2021-09-12: qty 5

## 2021-09-12 MED ORDER — ACETAMINOPHEN 325 MG PO TABS
650.0000 mg | ORAL_TABLET | Freq: Once | ORAL | Status: AC
  Administered 2021-09-12: 650 mg via ORAL
  Filled 2021-09-12: qty 2

## 2021-09-12 NOTE — Patient Instructions (Signed)
[facility] MEDICAL [facility]COLOGY  Discharge Instructions: Thank you for choosing [facility] to provide your oncology and hematology care.   If you have a lab appointment with the Cancer Center, please go directly to the Cancer Center and check in at the registration area.   Wear comfortable clothing and clothing appropriate for easy access to any Portacath or PICC line.   We strive to give you quality time with your provider. You may need to reschedule your appointment if you arrive late (15 or more minutes).  Arriving late affects you and other patients whose appointments are after yours.  Also, if you miss three or more appointments without notifying the office, you may be dismissed from the clinic at the provider's discretion.      For prescription refill requests, have your pharmacy contact our office and allow 72 hours for refills to be completed.    Today you received the following chemotherapy and/or immunotherapy agents: Herceptin Hylecta.     To help prevent nausea and vomiting after your treatment, we encourage you to take your nausea medication as directed.  BELOW ARE SYMPTOMS THAT SHOULD BE REPORTED IMMEDIATELY: . *FEVER GREATER THAN 100.4 F (38 C) OR HIGHER . *CHILLS OR SWEATING . *NAUSEA AND VOMITING THAT IS NOT CONTROLLED WITH YOUR NAUSEA MEDICATION . *UNUSUAL SHORTNESS OF BREATH . *UNUSUAL BRUISING OR BLEEDING . *URINARY PROBLEMS (pain or burning when urinating, or frequent urination) . *BOWEL PROBLEMS (unusual diarrhea, constipation, pain near the anus) . TENDERNESS IN MOUTH AND THROAT WITH OR WITHOUT PRESENCE OF ULCERS (sore throat, sores in mouth, or a toothache) . UNUSUAL RASH, SWELLING OR PAIN  . UNUSUAL VAGINAL DISCHARGE OR ITCHING   Items with * indicate a potential emergency and should be followed up as soon as possible or go to the Emergency Department if any problems should occur.  Please show the CHEMOTHERAPY ALERT CARD or  IMMUNOTHERAPY ALERT CARD at check-in to the Emergency Department and triage nurse.  Should you have questions after your visit or need to cancel or reschedule your appointment, please contact [facility]  Dept: [phone]  and follow the prompts.  Office hours are 8:00 a.m. to 4:30 p.m. Monday - Friday. Please note that voicemails left after 4:00 p.m. may not be returned until the following business day.  We are closed weekends and major holidays. You have access to a nurse at all times for urgent questions. Please call the main number to the clinic Dept: [phone] and follow the prompts.   For any non-urgent questions, you may also contact your provider using MyChart. We now offer e-Visits for anyone 18 and older to request care online for non-urgent symptoms. For details visit [email or web address].   Also download the MyChart app! Go to the app store, search "MyChart", open the app, select [facility], and log in with your MyChart username and password.  Due to Covid, a mask is required upon entering the hospital/clinic. If you do not have a mask, one will be given to you upon arrival. For doctor visits, patients may have 1 support person aged 18 or older with them. For treatment visits, patients cannot have anyone with them due to current Covid guidelines and our immunocompromised population.   

## 2021-09-15 ENCOUNTER — Encounter: Payer: Self-pay | Admitting: Nurse Practitioner

## 2021-09-26 ENCOUNTER — Encounter: Payer: Self-pay | Admitting: Hematology

## 2021-10-03 ENCOUNTER — Other Ambulatory Visit: Payer: Self-pay

## 2021-10-03 ENCOUNTER — Inpatient Hospital Stay: Payer: Medicare Other | Attending: Hematology

## 2021-10-03 ENCOUNTER — Other Ambulatory Visit: Payer: Self-pay | Admitting: General Surgery

## 2021-10-03 VITALS — BP 152/75 | HR 70 | Temp 97.6°F | Resp 18

## 2021-10-03 DIAGNOSIS — G629 Polyneuropathy, unspecified: Secondary | ICD-10-CM | POA: Insufficient documentation

## 2021-10-03 DIAGNOSIS — Z79899 Other long term (current) drug therapy: Secondary | ICD-10-CM | POA: Diagnosis not present

## 2021-10-03 DIAGNOSIS — C50212 Malignant neoplasm of upper-inner quadrant of left female breast: Secondary | ICD-10-CM | POA: Insufficient documentation

## 2021-10-03 DIAGNOSIS — Z5112 Encounter for antineoplastic immunotherapy: Secondary | ICD-10-CM | POA: Insufficient documentation

## 2021-10-03 DIAGNOSIS — Z170 Estrogen receptor positive status [ER+]: Secondary | ICD-10-CM | POA: Insufficient documentation

## 2021-10-03 MED ORDER — TRASTUZUMAB-HYALURONIDASE-OYSK 600-10000 MG-UNT/5ML ~~LOC~~ SOLN
600.0000 mg | Freq: Once | SUBCUTANEOUS | Status: AC
  Administered 2021-10-03: 600 mg via SUBCUTANEOUS
  Filled 2021-10-03: qty 5

## 2021-10-03 MED ORDER — ACETAMINOPHEN 325 MG PO TABS
650.0000 mg | ORAL_TABLET | Freq: Once | ORAL | Status: AC
  Administered 2021-10-03: 650 mg via ORAL
  Filled 2021-10-03: qty 2

## 2021-10-03 MED ORDER — DIPHENHYDRAMINE HCL 25 MG PO CAPS
50.0000 mg | ORAL_CAPSULE | Freq: Once | ORAL | Status: AC
  Administered 2021-10-03: 50 mg via ORAL
  Filled 2021-10-03: qty 2

## 2021-10-03 NOTE — Patient Instructions (Signed)
[facility]ICAL [facility]COL[facility]  Discharge Instructions: Thank you for choosing [facility] to provide your oncology and hematology care.   If you have a lab appointment with the Cancer Center, please go directly to the Cancer Center and check in at the registration area.   Wear comfortable clothing and clothing appropriate for easy access to any Portacath or PICC line.   We strive to give you quality time with your provider. You may need to reschedule your appointment if you arrive late (15 or more minutes).  Arriving late affects you and other patients whose appointments are after yours.  Also, if you miss three or more appointments without notifying the office, you may be dismissed from the clinic at the provider's discretion.      For prescription refill requests, have your pharmacy contact our office and allow 72 hours for refills to be completed.    Today you received the following chemotherapy and/or immunotherapy agents herceptin hylecta    To help prevent nausea and vomiting after your treatment, we encourage you to take your nausea medication as directed.  BELOW ARE SYMPTOMS THAT SHOULD BE REPORTED IMMEDIATELY: . *FEVER GREATER THAN 100.4 F (38 C) OR HIGHER . *CHILLS OR SWEATING . *NAUSEA AND VOMITING THAT IS NOT CONTROLLED WITH YOUR NAUSEA MEDICATION . *UNUSUAL SHORTNESS OF BREATH . *UNUSUAL BRUISING OR BLEEDING . *URINARY PROBLEMS (pain or burning when urinating, or frequent urination) . *BOWEL PROBLEMS (unusual diarrhea, constipation, pain near the anus) . TENDERNESS IN MOUTH AND THROAT WITH OR WITHOUT PRESENCE OF ULCERS (sore throat, sores in mouth, or a toothache) . UNUSUAL RASH, SWELLING OR PAIN  . UNUSUAL VAGINAL DISCHARGE OR ITCHING   Items with * indicate a potential emergency and should be followed up as soon as possible or go to the Emergency Department if any problems should occur.  Please show the CHEMOTHERAPY ALERT CARD or IMMUNOTHERAPY  ALERT CARD at check-in to the Emergency Department and triage nurse.  Should you have questions after your visit or need to cancel or reschedule your appointment, please contact [facility]  Dept: [phone]  and follow the prompts.  Office hours are 8:00 a.m. to 4:30 p.m. Monday - Friday. Please note that voicemails left after 4:00 p.m. may not be returned until the following business day.  We are closed weekends and major holidays. You have access to a nurse at all times for urgent questions. Please call the main number to the clinic Dept: [phone] and follow the prompts.   For any non-urgent questions, you may also contact your provider using MyChart. We now offer e-Visits for anyone 18 and older to request care online for non-urgent symptoms. For details visit [email or web address].   Also download the MyChart app! Go to the app store, search "MyChart", open the app, select [facility], and log in with your MyChart username and password.  Due to Covid, a mask is required upon entering the hospital/clinic. If you do not have a mask, one will be given to you upon arrival. For doctor visits, patients may have 1 support person aged 18 or older with them. For treatment visits, patients cannot have anyone with them due to current Covid guidelines and our immunocompromised population.   

## 2021-10-20 ENCOUNTER — Other Ambulatory Visit: Payer: Self-pay

## 2021-10-20 ENCOUNTER — Ambulatory Visit (INDEPENDENT_AMBULATORY_CARE_PROVIDER_SITE_OTHER): Payer: Medicare Other | Admitting: Family Medicine

## 2021-10-20 ENCOUNTER — Encounter: Payer: Self-pay | Admitting: Family Medicine

## 2021-10-20 VITALS — BP 138/78 | HR 94 | Temp 98.6°F | Ht 67.0 in | Wt 183.0 lb

## 2021-10-20 DIAGNOSIS — R051 Acute cough: Secondary | ICD-10-CM | POA: Diagnosis not present

## 2021-10-20 DIAGNOSIS — R52 Pain, unspecified: Secondary | ICD-10-CM | POA: Diagnosis not present

## 2021-10-20 DIAGNOSIS — J0100 Acute maxillary sinusitis, unspecified: Secondary | ICD-10-CM | POA: Diagnosis not present

## 2021-10-20 DIAGNOSIS — Z20822 Contact with and (suspected) exposure to covid-19: Secondary | ICD-10-CM

## 2021-10-20 LAB — POC INFLUENZA A&B (BINAX/QUICKVUE)
Influenza A, POC: NEGATIVE
Influenza B, POC: NEGATIVE

## 2021-10-20 MED ORDER — DOXYCYCLINE HYCLATE 100 MG PO TABS: 100.0000 mg | ORAL_TABLET | Freq: Two times a day (BID) | ORAL | 0 refills | Status: DC

## 2021-10-20 NOTE — Progress Notes (Signed)
[facility] [place] T. Fareedah Mahler, MD, [place] at [facility] [place], [address]  Phone: [phone]  FAX: [phone]  AMYA HLAD Leiphart - 65 y.o. female  MRN [id number]  Date of Birth: [birth year]  Date: 10/20/2021  PCP: [name], MD  Referral: [name], MD  Chief Complaint  Patient presents with   Headache    2 negative home Covid Test-One on Saturday and one this morning. Symptoms started on Thursday   Facial Pain    Teeth hurts     This visit occurred during the SARS-CoV-2 public health emergency.  Safety protocols were in place, including screening questions prior to the visit, additional usage of staff PPE, and extensive cleaning of exam room while observing appropriate contact time as indicated for disinfecting solutions.   Subjective:   Aimee Mcclain is a 65 y.o. very pleasant female patient with Body mass index is 28.66 kg/m. who presents with the following:  She has a headache and her teeth are hurting.  Covid test negative x 2.  Day #4 today, and she had a negative test.  Dx Breast cancer earlier in the year, and she is still getting immunotherapy.  Both sides will have some tooth pain.  Some ha Some sore throat.  Then whole neck was sore.   Some chills and will have some stuff in the back of throat.   Did have some diarrhea and had some stomach pain. Hands felt like they were vibrateing.  Eating and drinking ok.  Immunotherapy right now.   Review of Systems is noted in the HPI, as appropriate  Objective:   BP 138/78   Pulse 94   Temp 98.6 F (37 C) (Temporal)   Ht 5\' 7"  (1.702 m)   Wt 183 lb (83 kg)   SpO2 98%   BMI 28.66 kg/m   GEN: No acute distress; alert,appropriate. PULM: Breathing comfortably in no respiratory distress PSYCH: Normally interactive.  CV: RRR, no m/g/r  PULM: Normal respiratory rate, no accessory muscle use. No wheezes, crackles or rhonchi    Laboratory and Imaging Data: Results for orders placed or performed in visit on 10/20/21  POC Influenza A&B ([facility]ax test)  Result Value Ref Range   Influenza A, POC Negative Negative   Influenza B, POC Negative Negative     Assessment and Plan:     ICD-10-CM   1. Acute non-recurrent maxillary sinusitis  J01.00     2. Suspected COVID-19 virus infection  Z20.822 Novel Coronavirus, NAA ([facility])    Novel Coronavirus, NAA ([facility])    3. Acute cough  R05.1 Novel Coronavirus, NAA ([facility])    POC Influenza A&B ([facility]ax test)    Novel Coronavirus, NAA ([facility])    4. Body aches  R52 Novel Coronavirus, NAA ([facility])    POC Influenza A&B (Binax test)    Novel Coronavirus, NAA ([facility])     Multisystem acute illness with significant cough, polyarthralgia, muscle aches, sore throat, headache, facial pain and tooth ache.  With facial pain and tooth aches, more likely maxillary sinusitis.  Influenza is negative.  Given relative immunocompromise, check a COVID PCR.  She also is supposed to have some upcoming immunotherapy in the cancer center, and we need to be definitively clear that she does not have COVID-19.  If COVID-positive, then DC antibiotics and start antivirals.  Meds ordered this encounter  Medications   doxycycline (VIBRA-TABS) 100 MG tablet    Sig: Take 1 tablet (  100 mg total) by mouth 2 (two) times daily.    Dispense:  20 tablet    Refill:  0   Medications Discontinued During This Encounter  Medication Reason   calcium carbonate (TUMS - DOSED IN MG ELEMENTAL CALCIUM) 500 MG chewable tablet Patient Preference   Orders Placed This Encounter  Procedures   Novel Coronavirus, NAA ([facility])   POC Influenza A&B (Binax test)    Follow-up: No follow-ups on file.  Dragon [employer] One speech-to-text software was used for transcription in this dictation.  Possible transcriptional errors can occur using Editor, commissioning.   Signed,  Maud Deed. Davey Bergsma, MD   Outpatient  Encounter Medications as of 10/20/2021  Medication Sig   acetaminophen (TYLENOL) 500 MG tablet Take 1,000 mg by mouth every 6 (six) hours as needed.   Calcium 600-200 MG-UNIT tablet Take 1 tablet by mouth daily.   doxycycline (VIBRA-TABS) 100 MG tablet Take 1 tablet (100 mg total) by mouth 2 (two) times daily.   exemestane (AROMASIN) 25 MG tablet Take 1 tablet (25 mg total) by mouth daily after breakfast.   lidocaine-prilocaine (EMLA) cream Apply 1 application topically as needed.   Multiple Vitamin (MULTIVITAMIN) tablet Take 1 tablet by mouth daily.   omeprazole (PRILOSEC) 20 MG capsule Take 20 mg by mouth daily.   valACYclovir (VALTREX) 500 MG tablet Take 1 tablet (500 mg total) by mouth 2 (two) times daily as needed.   Vitamin B Complex-C CAPS Take 1 capsule by mouth.   gabapentin (NEURONTIN) 100 MG capsule Take 1 capsule (100 mg total) by mouth 3 (three) times daily. (Patient not taking: Reported on 10/20/2021)   lisinopril (ZESTRIL) 5 MG tablet Take 1 tablet (5 mg total) by mouth daily. (Patient not taking: Reported on 10/20/2021)   ondansetron (ZOFRAN) 8 MG tablet Take 1 tablet (8 mg total) by mouth every 8 (eight) hours as needed for nausea or vomiting. (Patient not taking: Reported on 10/20/2021)   prochlorperazine (COMPAZINE) 10 MG tablet Take 1 tablet (10 mg total) by mouth every 6 (six) hours as needed. (Patient not taking: Reported on 10/20/2021)   traMADol (ULTRAM) 50 MG tablet Take 1 tablet (50 mg total) by mouth every 6 (six) hours as needed. (Patient not taking: Reported on 10/20/2021)   [DISCONTINUED] calcium carbonate (TUMS - DOSED IN MG ELEMENTAL CALCIUM) 500 MG chewable tablet Chew 1 tablet by mouth as needed for indigestion or heartburn. Takes 5-6 per day   No facility-administered encounter medications on file as of 10/20/2021.

## 2021-10-21 LAB — NOVEL CORONAVIRUS, NAA: SARS-CoV-2, NAA: NOT DETECTED

## 2021-10-21 LAB — SARS-COV-2, NAA 2 DAY TAT

## 2021-10-23 ENCOUNTER — Encounter: Payer: Self-pay | Admitting: *Deleted

## 2021-10-23 ENCOUNTER — Other Ambulatory Visit: Payer: Self-pay

## 2021-10-23 ENCOUNTER — Encounter: Payer: Self-pay | Admitting: Nurse Practitioner

## 2021-10-24 ENCOUNTER — Encounter: Payer: Self-pay | Admitting: Hematology

## 2021-10-24 ENCOUNTER — Inpatient Hospital Stay (HOSPITAL_BASED_OUTPATIENT_CLINIC_OR_DEPARTMENT_OTHER): Payer: Medicare Other | Admitting: Hematology

## 2021-10-24 ENCOUNTER — Inpatient Hospital Stay: Payer: Medicare Other | Attending: Hematology

## 2021-10-24 ENCOUNTER — Inpatient Hospital Stay: Payer: Medicare Other

## 2021-10-24 ENCOUNTER — Other Ambulatory Visit: Payer: Self-pay

## 2021-10-24 VITALS — BP 146/84 | HR 83 | Temp 98.1°F | Resp 18 | Ht 67.0 in | Wt 185.9 lb

## 2021-10-24 DIAGNOSIS — N951 Menopausal and female climacteric states: Secondary | ICD-10-CM | POA: Diagnosis not present

## 2021-10-24 DIAGNOSIS — C50212 Malignant neoplasm of upper-inner quadrant of left female breast: Secondary | ICD-10-CM

## 2021-10-24 DIAGNOSIS — Z9221 Personal history of antineoplastic chemotherapy: Secondary | ICD-10-CM | POA: Diagnosis not present

## 2021-10-24 DIAGNOSIS — M8580 Other specified disorders of bone density and structure, unspecified site: Secondary | ICD-10-CM | POA: Insufficient documentation

## 2021-10-24 DIAGNOSIS — Z170 Estrogen receptor positive status [ER+]: Secondary | ICD-10-CM

## 2021-10-24 DIAGNOSIS — Z95828 Presence of other vascular implants and grafts: Secondary | ICD-10-CM

## 2021-10-24 DIAGNOSIS — Z5112 Encounter for antineoplastic immunotherapy: Secondary | ICD-10-CM | POA: Insufficient documentation

## 2021-10-24 DIAGNOSIS — I429 Cardiomyopathy, unspecified: Secondary | ICD-10-CM | POA: Diagnosis not present

## 2021-10-24 DIAGNOSIS — G629 Polyneuropathy, unspecified: Secondary | ICD-10-CM | POA: Diagnosis not present

## 2021-10-24 DIAGNOSIS — T451X5A Adverse effect of antineoplastic and immunosuppressive drugs, initial encounter: Secondary | ICD-10-CM

## 2021-10-24 DIAGNOSIS — I427 Cardiomyopathy due to drug and external agent: Secondary | ICD-10-CM

## 2021-10-24 DIAGNOSIS — Z79899 Other long term (current) drug therapy: Secondary | ICD-10-CM | POA: Insufficient documentation

## 2021-10-24 LAB — CBC WITH DIFFERENTIAL (CANCER CENTER ONLY)
Abs Immature Granulocytes: 0.02 10*3/uL (ref 0.00–0.07)
Basophils Absolute: 0.1 10*3/uL (ref 0.0–0.1)
Basophils Relative: 1 %
Eosinophils Absolute: 0.1 10*3/uL (ref 0.0–0.5)
Eosinophils Relative: 1 %
HCT: 38.8 % (ref 36.0–46.0)
Hemoglobin: 12.8 g/dL (ref 12.0–15.0)
Immature Granulocytes: 0 %
Lymphocytes Relative: 29 %
Lymphs Abs: 1.8 10*3/uL (ref 0.7–4.0)
MCH: 28.5 pg (ref 26.0–34.0)
MCHC: 33 g/dL (ref 30.0–36.0)
MCV: 86.4 fL (ref 80.0–100.0)
Monocytes Absolute: 0.5 10*3/uL (ref 0.1–1.0)
Monocytes Relative: 9 %
Neutro Abs: 3.7 10*3/uL (ref 1.7–7.7)
Neutrophils Relative %: 60 %
Platelet Count: 230 10*3/uL (ref 150–400)
RBC: 4.49 MIL/uL (ref 3.87–5.11)
RDW: 13.7 % (ref 11.5–15.5)
WBC Count: 6.1 10*3/uL (ref 4.0–10.5)
nRBC: 0 % (ref 0.0–0.2)

## 2021-10-24 LAB — CMP (CANCER CENTER ONLY)
ALT: 15 U/L (ref 0–44)
AST: 19 U/L (ref 15–41)
Albumin: 3.9 g/dL (ref 3.5–5.0)
Alkaline Phosphatase: 97 U/L (ref 38–126)
Anion gap: 8 (ref 5–15)
BUN: 13 mg/dL (ref 8–23)
CO2: 27 mmol/L (ref 22–32)
Calcium: 9.6 mg/dL (ref 8.9–10.3)
Chloride: 107 mmol/L (ref 98–111)
Creatinine: 0.78 mg/dL (ref 0.44–1.00)
GFR, Estimated: >60 mL/min (ref >60)
Glucose, Bld: 95 mg/dL (ref 70–99)
Potassium: 4 mmol/L (ref 3.5–5.1)
Sodium: 142 mmol/L (ref 135–145)
Total Bilirubin: 0.4 mg/dL (ref 0.3–1.2)
Total Protein: 7.2 g/dL (ref 6.5–8.1)

## 2021-10-24 MED ORDER — DIPHENHYDRAMINE HCL 25 MG PO CAPS
50.0000 mg | ORAL_CAPSULE | Freq: Once | ORAL | Status: AC
  Administered 2021-10-24: 50 mg via ORAL
  Filled 2021-10-24: qty 2

## 2021-10-24 MED ORDER — LACTULOSE 10 GM/15ML PO SOLN: 20.0000 g | ORAL | 0 refills | Status: DC | PRN

## 2021-10-24 MED ORDER — TRASTUZUMAB-HYALURONIDASE-OYSK 600-10000 MG-UNT/5ML ~~LOC~~ SOLN
600.0000 mg | Freq: Once | SUBCUTANEOUS | Status: AC
  Administered 2021-10-24: 600 mg via SUBCUTANEOUS
  Filled 2021-10-24: qty 5

## 2021-10-24 MED ORDER — HEPARIN SOD (PORK) LOCK FLUSH 100 UNIT/ML IV SOLN
500.0000 [IU] | Freq: Once | INTRAVENOUS | Status: AC
  Administered 2021-10-24: 500 [IU]

## 2021-10-24 MED ORDER — ACETAMINOPHEN 325 MG PO TABS
650.0000 mg | ORAL_TABLET | Freq: Once | ORAL | Status: AC
  Administered 2021-10-24: 650 mg via ORAL
  Filled 2021-10-24: qty 2

## 2021-10-24 MED ORDER — SODIUM CHLORIDE 0.9% FLUSH
10.0000 mL | Freq: Once | INTRAVENOUS | Status: AC
  Administered 2021-10-24: 10 mL

## 2021-10-24 NOTE — Progress Notes (Signed)
[place]   [phone] [phone]   Clinic Follow up Note   Patient Care Team: [name], MD as PCP - General (Family Medicine) Rockwell Germany, RN as Oncology Nurse Navigator Mauro Kaufmann, RN as Oncology Nurse Navigator Stark Klein, MD as Consulting Physician (General Surgery) Truitt Merle, MD as Consulting Physician (Hematology) Eppie Gibson, MD as Attending Physician (Radiation Oncology) Delsa Bern, MD as Consulting Physician (Obstetrics and Gynecology)  Date of Service:  10/24/2021  CHIEF COMPLAINT: f/u of left breast cancer  CURRENT THERAPY:  Maintenance herceptin q3 weeks to complete 1 year, starting 03/18/2021 Exemestane started 08/2021  ASSESSMENT & PLAN:  Aimee Mcclain is a 65 y.o. female with   1. Malignant neoplasm of upper-inner quadrant of left breast, cT1cN1M0 stage Ib, ER+/PR+/HER2+, pT2N0 stage IA, grade 3 -Diagnosed 12/2020 s/p left lumpectomy and SLNB with Port-A-Cath placement 02/13/21 by Dr. Barry Dienes -Adjuvant therapy with weekly Taxol and Herceptin followed by maintenance Herceptin every 3 weeks to complete 1 year was recommended to reduce her risk of recurrence.  -Staging work-up showed small indeterminate lung nodules and an indeterminate left parietal bone lesion which will monitor with future scans, otherwise negative for distant metastasis -She completed 10 of 12 doses of adjuvant Taxol/Abraxane on 05/21/21, last 2 doses was canceled due to her worsening neuropathy. -She received radiation treatment 06/30/21 through 07/25/21 under Dr. Isidore Moos. -she began exemestane 08/2021. She is tolerating well except constipation, with no relief from miralax or dulcolax. I will prescribe lactulose for her to try. I also recommended milk of magnesium. -Continue Herceptin every 3 weeks, for a total of 1 year treatment until March 2023.  Her insurance has approved Herceptin injection. -most recent echo on 08/06/21 was normal. I will  order repeat. -she will f/u in survivorship clinic in 3 months   2. Bone health -DEXA 06/24/21 showed osteopenia, with T-score of -1.3. -will follow every 2 years while on AI.   3. Peripheral neuropathy G1 -She has baseline neuropathy in her feet, etiology unknown -She is not diabetic, B12 level was normal  -No functional deficits -Due to neuropathy on her face, Abraxane discontinued after cycle 10. -I prescribed Neurontin 100 mg as needed on 05/28/21 -Neuropathy has improved overall, still moderate.   4. Hot flashes -She was previously on HRT which she stopped with cancer diagnosis in 12/2020 -Hot flashes are mild and tolerable at this time -She was placed on gabapentin for neuropathy.   5. Small indeterminate lung nodules and left parietal bone lesion -seen on initial staging work up -unchanged left parietal bone lesion on brain MRI 05/29/21, non-aggressive appearance on head CT 08/20/21, this is likely benign, no f/u scan needed  -one 5 mm RLL nodule on chest CT 08/20/21, previous small lung nodule resolved. Likely benign. I reviewed and discussed with pt  -will repeat CT chest one more time in 6-12 months   6. Sinus infection -improving with doxycyline     Plan: -Continue Herceptin injection every 3 weeks, will proceed today, will repeat labs every 3 treatments -continue exemestane -repeat echo in next 3-6 weeks  -port removal 11/04/21 -survivorship 11/13/21, will schedule her future appointment on next visit    No problem-specific Assessment & Plan notes found for this encounter.   SUMMARY OF ONCOLOGIC HISTORY: Oncology History Overview Note  Cancer Staging Malignant neoplasm of upper-inner quadrant of left breast in female, estrogen receptor positive ([place]) Staging form: Breast, AJCC 8th Edition - Clinical stage from 01/14/2020: Stage  IB (cT1c, cN1, cM0, G3, ER+, PR+, HER2+) - Signed by Truitt Merle, MD on 01/21/2021 Stage prefix: Initial diagnosis - Pathologic stage from  02/13/2021: Stage IA (pT2, pN0, cM0, G3, ER+, PR+, HER2+) - Signed by Heilingoetter, Cassandra L, PA-C on 03/07/2021 Stage prefix: Initial diagnosis Nuclear grade: G3 Histologic grading system: 3 grade system    Malignant neoplasm of upper-inner quadrant of left breast in female, estrogen receptor positive ([place])  01/14/2020 Cancer Staging   Staging form: Breast, AJCC 8th Edition - Clinical stage from 01/14/2020: Stage IB (cT1c, cN1, cM0, G3, ER+, PR+, HER2+) - Signed by Truitt Merle, MD on 01/21/2021 Stage prefix: Initial diagnosis    01/02/2021 Mammogram   Left breast with 1.9x1x1.8 cm complex cyst at 12:00 position, 4cmfn, versus saloid mass with calcifications, irregulr, increased in size. Biopsy recommended.    01/13/2021 Initial Biopsy   Diagnosis 1. Breast, left, needle core biopsy, 11 o'clock, 4cmfn - INVASIVE DUCTAL CARCINOMA - SEE COMMENT 2. Lymph node, needle/core biopsy, left axilla - FIBROVASCULAR AND ADIPOSE TISSUE - NO LYMPHOID TISSUE OR CARCINOMA IDENTIFIED Microscopic Comment 1. Based on the biopsy, the carcinoma appears Nottingham grade 3 of 3 and measures 1.2 cm in greatest linear extent. Prognostic markers (ER/PR/ki-67/HER2) are pending and will be reported in an addendum. Dr. Jeannie Done reviewed the case and agrees with the above diagnosis. These results were called to [facility] on January 14, 2021   01/13/2021 Receptors her2   1. PROGNOSTIC INDICATORS Results: IMMUNOHISTOCHEMICAL AND MORPHOMETRIC ANALYSIS PERFORMED MANUALLY The tumor cells are POSITIVE for Her2 (3+). Estrogen Receptor: 95%, POSITIVE, STRONG STAINING INTENSITY Progesterone Receptor: 85%, POSITIVE, STRONG STAINING INTENSITY Proliferation Marker Ki67: 30%   01/16/2021 Initial Diagnosis   Malignant neoplasm of upper-inner quadrant of left breast in female, estrogen receptor positive ([place])   01/23/2021 Imaging   MRI Breast  IMPRESSION: 1. Known malignancy measuring 2.9 centimeters in the UPPER  INNER QUADRANT of the LEFT breast. There is no evidence for involvement of the pectoralis muscle or implant. 2. No evidence for lymphadenopathy. 3. RIGHT breast is negative. 4. Bilateral retropectoral saline implants.   02/09/2021 Imaging   MRI Brain IMPRESSION: No evidence of intracranial metastatic disease.   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     02/13/2021 Surgery   LEFT BREAST LUMPECTOMY WITH RADIOACTIVE SEED AND SENTINEL LYMPH NODE BIOPSY WITH SEED TARGETED NODE and PAC Placed by Dr Barry Dienes   02/13/2021 Pathology Results   FINAL MICROSCOPIC DIAGNOSIS:   A. BREAST, LEFT, LUMPECTOMY:  - Invasive ductal carcinoma, 2.6 cm, Nottingham grade 3 of 3.  - Ductal carcinoma in situ, high grade with central necrosis.  - Margins of resection:       - Invasive carcinoma broadly involves the anterior and inferior  margins.  Invasive carcinoma is focally < 1 mm from each the superior  and medial margins.       - DCIS is focally < 1 mm from the inferior margin.  DCIS is focally  1 mm from each the anterior and superior margins.  DCIS is focally 1-2  mm from the medial margin.  - Biopsy clip.  - See oncology table.   B. BREAST, LEFT, ANTERIOR TISSUE, EXCISION:  - Residual invasive and in-situ ductal carcinoma.  - Residual invasive ductal carcinoma broadly involves the posterior  margin.  - Residual DCIS is focally 1 mm from the posterior margin.   C. BREAST, LEFT, INFERIOR MARGIN, EXCISION:  - No carcinoma identified.   D. SENTINEL LYMPH NODE, LEFT AXILLARY #  1, BIOPSY:  - No carcinoma identified in one lymph node (0/1).   E. SENTINEL LYMPH NODE, LEFT AXILLARY #2, BIOPSY:  - Biopsy site.  - No distinct nodal tissue identified.  - No carcinoma identified.   F. SENTINEL LYMPH NODE, LEFT AXILLARY #3, BIOPSY:  - No carcinoma identified in one lymph node (0/1).   G. SENTINEL LYMPH NODE, LEFT AXILLARY #4, BIOPSY:  - No carcinoma identified in one lymph node (0/1).     02/13/2021 Cancer Staging   Staging form: Breast, AJCC 8th Edition - Pathologic stage from 02/13/2021: Stage IA (pT2, pN0, cM0, G3, ER+, PR+, HER2+) - Signed by Heilingoetter, Cassandra L, PA-C on 03/07/2021 Stage prefix: Initial diagnosis Nuclear grade: G3 Histologic grading system: 3 grade system    03/18/2021 -  Chemotherapy   Patient is on Treatment Plan : BREAST Paclitaxel + Trastuzumab q7d / Trastuzumab q21d     08/20/2021 Imaging   CT Chest w/o contrast  IMPRESSION: No definite evidence of pulmonary metastatic disease.   5 mm indeterminate right lower lobe pulmonary nodule. Comparison with prior examinations, if available, is recommended to determine chronicity. No follow-up needed if patient is low-risk. In absence of priors, Non-contrast chest CT can be considered in 12 months if patient is high-risk. This recommendation follows the consensus statement: Guidelines for Management of Incidental Pulmonary Nodules Detected on CT Images: From the Fleischner Society 2017; Radiology 2017; 284:228-243.   Mild coronary artery calcification.   08/20/2021 Imaging   CT Head w/o contrast  IMPRESSION: No acute abnormality. No evidence of intracranial metastatic disease.      INTERVAL HISTORY:  Aimee Mcclain is here for a follow up of breast cancer. She was last seen by me on 08/22/21. She presents to the clinic accompanied by her husband. She reports she has a sinus infection. She notes she tested negative for flu and Covid (results in Epic). She reports she has had green mucus and post-nasal drip. She was prescribed doxycyline. She reports constipation from the exemestane. She notes she has not had a bowel movement since Sunday. She notes her "normal" is 3-4 times a week. Her husband notes she is not eating well right now.   All other systems were reviewed with the patient and are negative.  MEDICAL HISTORY:  Past Medical History:  Diagnosis Date   Breast cancer [facility])    Cataract     Colon polyps    Endometriosis    Family history of colon cancer    Family history of colonic polyps    Fuchs' corneal dystrophy    GERD (gastroesophageal reflux disease)    H/O bilateral breast implants    History of colon polyps    HPV in female    HSV (herpes simplex virus) infection    Migraine    S/P TAH (total abdominal hysterectomy) 1991    SURGICAL HISTORY: Past Surgical History:  Procedure Laterality Date   ABDOMINAL HYSTERECTOMY     BREAST BIOPSY     Benign.   BREAST ENHANCEMENT SURGERY     BREAST LUMPECTOMY Bilateral 1993 and 1995   BREAST LUMPECTOMY WITH RADIOACTIVE SEED AND SENTINEL LYMPH NODE BIOPSY Left 02/13/2021   Procedure: LEFT BREAST LUMPECTOMY WITH RADIOACTIVE SEED AND SENTINEL LYMPH NODE BIOPSY WITH SEED TARGETED NODE;  Surgeon: Stark Klein, MD;  Location: [place];  Service: General;  Laterality: Left;   CATARACT EXTRACTION, BILATERAL     CESAREAN SECTION     PARTIAL HYSTERECTOMY  PORTACATH PLACEMENT Right 02/13/2021   Procedure: INSERTION PORT-A-CATH;  Surgeon: Stark Klein, MD;  Location: [place];  Service: General;  Laterality: Right;   TONSILLECTOMY     WISDOM TOOTH EXTRACTION      I have reviewed the social history and family history with the patient and they are unchanged from previous note.  ALLERGIES:  is allergic to ibuprofen, omnicef [cefdinir], proton pump inhibitors, roxicodone [oxycodone hcl], and vioxx [rofecoxib].  MEDICATIONS:  Current Outpatient Medications  Medication Sig Dispense Refill   lactulose (CHRONULAC) 10 GM/15ML solution Take 30 mLs (20 g total) by mouth every 4 (four) hours as needed for mild constipation. 236 mL 0   acetaminophen (TYLENOL) 500 MG tablet Take 1,000 mg by mouth every 6 (six) hours as needed.     Calcium 600-200 MG-UNIT tablet Take 1 tablet by mouth daily.     doxycycline (VIBRA-TABS) 100 MG tablet Take 1 tablet (100 mg total) by mouth 2 (two) times daily. 20 tablet 0    exemestane (AROMASIN) 25 MG tablet Take 1 tablet (25 mg total) by mouth daily after breakfast. 30 tablet 3   gabapentin (NEURONTIN) 100 MG capsule Take 1 capsule (100 mg total) by mouth 3 (three) times daily. (Patient not taking: Reported on 10/20/2021) 60 capsule 0   lidocaine-prilocaine (EMLA) cream Apply 1 application topically as needed. 30 g 2   lisinopril (ZESTRIL) 5 MG tablet Take 1 tablet (5 mg total) by mouth daily. (Patient not taking: Reported on 10/20/2021) 90 tablet 3   Multiple Vitamin (MULTIVITAMIN) tablet Take 1 tablet by mouth daily.     omeprazole (PRILOSEC) 20 MG capsule Take 20 mg by mouth daily.     ondansetron (ZOFRAN) 8 MG tablet Take 1 tablet (8 mg total) by mouth every 8 (eight) hours as needed for nausea or vomiting. (Patient not taking: Reported on 10/20/2021) 30 tablet 2   prochlorperazine (COMPAZINE) 10 MG tablet Take 1 tablet (10 mg total) by mouth every 6 (six) hours as needed. (Patient not taking: Reported on 10/20/2021) 30 tablet 2   traMADol (ULTRAM) 50 MG tablet Take 1 tablet (50 mg total) by mouth every 6 (six) hours as needed. (Patient not taking: Reported on 10/20/2021) 10 tablet 0   valACYclovir (VALTREX) 500 MG tablet Take 1 tablet (500 mg total) by mouth 2 (two) times daily as needed.     Vitamin B Complex-C CAPS Take 1 capsule by mouth.     No current facility-administered medications for this visit.    PHYSICAL EXAMINATION: ECOG PERFORMANCE STATUS: 1 - Symptomatic but completely ambulatory  Vitals:   10/24/21 1446  BP: (!) 146/84  Pulse: 83  Resp: 18  Temp: 98.1 F (36.7 C)  SpO2: 95%   Wt Readings from Last 3 Encounters:  10/24/21 185 lb 14.4 oz (84.3 kg)  10/20/21 183 lb (83 kg)  09/12/21 194 lb 4 oz (88.1 kg)     GENERAL:alert, no distress and comfortable SKIN: skin color normal, no rashes or significant lesions EYES: normal, Conjunctiva are pink and non-injected, sclera clear  NEURO: alert & oriented x 3 with fluent speech  LABORATORY  DATA:  I have reviewed the data as listed CBC Latest Ref Rng & Units 10/24/2021 08/22/2021 06/20/2021  WBC 4.0 - 10.5 K/uL 6.1 4.2 6.1  Hemoglobin 12.0 - 15.0 g/dL 12.8 14.2 13.0  Hematocrit 36.0 - 46.0 % 38.8 41.5 39.1  Platelets 150 - 400 K/uL 230 170 216     CMP Latest Ref Rng &  Units 10/24/2021 08/22/2021 06/20/2021  Glucose 70 - 99 mg/dL 95 88 89  BUN 8 - 23 mg/dL 13 17 14   Creatinine 0.44 - 1.00 mg/dL 0.78 0.96 0.79  Sodium 135 - 145 mmol/L 142 141 143  Potassium 3.5 - 5.1 mmol/L 4.0 4.2 4.1  Chloride 98 - 111 mmol/L 107 107 108  CO2 22 - 32 mmol/L 27 23 26   Calcium 8.9 - 10.3 mg/dL 9.6 10.1 9.3  Total Protein 6.5 - 8.1 g/dL 7.2 7.3 7.1  Total Bilirubin 0.3 - 1.2 mg/dL 0.4 0.4 0.5  Alkaline Phos 38 - 126 U/L 97 86 101  AST 15 - 41 U/L 19 30 25   ALT 0 - 44 U/L 15 26 24       RADIOGRAPHIC STUDIES: I have personally reviewed the radiological images as listed and agreed with the findings in the report. No results found.    Orders Placed This Encounter  Procedures   ECHOCARDIOGRAM COMPLETE    Standing Status:   Future    Standing Expiration Date:   10/24/2022    Order Specific Question:   Where should this test be performed    Answer:   [place]    Order Specific Question:   Perflutren DEFINITY (image enhancing agent) should be administered unless hypersensitivity or allergy exist    Answer:   Administer Perflutren    Order Specific Question:   Is a special reader required? (athlete or structural heart)    Answer:   No    Order Specific Question:   Does this study need to be read by the Structural team/Level 3 readers?    Answer:   Yes - Echo_Structural_Heart    Order Specific Question:   Reason for exam-Echo    Answer:   Chemo  Z09    Order Specific Question:   Other Comments    Answer:   Chemotherapy induced cardiomyopathy   All questions were answered. The patient knows to call the clinic with any problems, questions or concerns. No barriers to learning was  detected. The total time spent in the appointment was 30 minutes.     Truitt Merle, MD 10/24/2021   I, Wilburn Mylar, am acting as scribe for Truitt Merle, MD.   I have reviewed the above documentation for accuracy and completeness, and I agree with the above.

## 2021-10-29 ENCOUNTER — Other Ambulatory Visit: Payer: Self-pay

## 2021-10-31 ENCOUNTER — Other Ambulatory Visit: Payer: Self-pay

## 2021-10-31 ENCOUNTER — Encounter (HOSPITAL_COMMUNITY): Payer: Self-pay | Admitting: General Surgery

## 2021-10-31 NOTE — Progress Notes (Signed)
Spoke with pt for pre-op call. Pt denies cardiac history, HTN or Diabetes.  Pt's surgery is scheduled as ambulatory so no Covid test is required prior to surgery.

## 2021-11-01 ENCOUNTER — Encounter: Payer: Self-pay | Admitting: Family Medicine

## 2021-11-03 ENCOUNTER — Telehealth: Payer: Self-pay | Admitting: Family Medicine

## 2021-11-03 NOTE — Telephone Encounter (Signed)
See my chart message

## 2021-11-03 NOTE — Telephone Encounter (Signed)
Please call pt and get update on her situation.  I wasn't able to see her mychart message over the weekend.  Let me know how she is feeling. Thanks.

## 2021-11-04 ENCOUNTER — Encounter (HOSPITAL_COMMUNITY): Payer: Self-pay | Admitting: General Surgery

## 2021-11-04 ENCOUNTER — Other Ambulatory Visit: Payer: Self-pay

## 2021-11-04 ENCOUNTER — Encounter (HOSPITAL_COMMUNITY)
Admission: RE | Disposition: A | Discharge status: Home / Self Care | Payer: Self-pay | Source: Home / Self Care | Attending: General Surgery

## 2021-11-04 ENCOUNTER — Ambulatory Visit (HOSPITAL_COMMUNITY)
Admission: RE | Admit: 2021-11-04 | Discharge: 2021-11-04 | Disposition: A | Discharge status: Home / Self Care | Payer: Medicare Other | Attending: General Surgery | Admitting: General Surgery

## 2021-11-04 ENCOUNTER — Ambulatory Visit (HOSPITAL_COMMUNITY): Payer: Medicare Other | Admitting: Anesthesiology

## 2021-11-04 DIAGNOSIS — C50912 Malignant neoplasm of unspecified site of left female breast: Secondary | ICD-10-CM | POA: Diagnosis not present

## 2021-11-04 DIAGNOSIS — K219 Gastro-esophageal reflux disease without esophagitis: Secondary | ICD-10-CM | POA: Diagnosis not present

## 2021-11-04 DIAGNOSIS — Z452 Encounter for adjustment and management of vascular access device: Secondary | ICD-10-CM | POA: Insufficient documentation

## 2021-11-04 DIAGNOSIS — Z923 Personal history of irradiation: Secondary | ICD-10-CM | POA: Diagnosis not present

## 2021-11-04 DIAGNOSIS — Z9221 Personal history of antineoplastic chemotherapy: Secondary | ICD-10-CM | POA: Insufficient documentation

## 2021-11-04 DIAGNOSIS — I10 Essential (primary) hypertension: Secondary | ICD-10-CM | POA: Diagnosis not present

## 2021-11-04 DIAGNOSIS — Z87891 Personal history of nicotine dependence: Secondary | ICD-10-CM | POA: Diagnosis not present

## 2021-11-04 HISTORY — PX: PORT-A-CATH REMOVAL: SHX5289

## 2021-11-04 HISTORY — DX: Unspecified osteoarthritis, unspecified site: M19.90

## 2021-11-04 SURGERY — REMOVAL PORT-A-CATH
Anesthesia: Monitor Anesthesia Care | Site: Chest | Laterality: Right

## 2021-11-04 MED ORDER — FENTANYL CITRATE (PF) 100 MCG/2ML IJ SOLN
25.0000 ug | INTRAMUSCULAR | Status: DC | PRN
  Administered 2021-11-04 (×2): 50 ug via INTRAVENOUS

## 2021-11-04 MED ORDER — LIDOCAINE HCL 1 % IJ SOLN
INTRAMUSCULAR | Status: DC | PRN
  Administered 2021-11-04: 8 mL via INTRAMUSCULAR

## 2021-11-04 MED ORDER — PROPOFOL 1000 MG/100ML IV EMUL
INTRAVENOUS | Status: AC
  Filled 2021-11-04: qty 100

## 2021-11-04 MED ORDER — DEXMEDETOMIDINE (PRECEDEX) IN NS 20 MCG/5ML (4 MCG/ML) IV SYRINGE
PREFILLED_SYRINGE | INTRAVENOUS | Status: AC
  Filled 2021-11-04: qty 5

## 2021-11-04 MED ORDER — LACTATED RINGERS IV SOLN: INTRAVENOUS | Status: DC

## 2021-11-04 MED ORDER — FENTANYL CITRATE (PF) 250 MCG/5ML IJ SOLN
INTRAMUSCULAR | Status: DC | PRN
  Administered 2021-11-04: 25 ug via INTRAVENOUS
  Administered 2021-11-04: 50 ug via INTRAVENOUS
  Administered 2021-11-04: 25 ug via INTRAVENOUS

## 2021-11-04 MED ORDER — ONDANSETRON HCL 4 MG/2ML IJ SOLN: 4.0000 mg | Freq: Once | INTRAMUSCULAR | Status: DC | PRN

## 2021-11-04 MED ORDER — CIPROFLOXACIN IN D5W 400 MG/200ML IV SOLN
400.0000 mg | INTRAVENOUS | Status: AC
  Administered 2021-11-04: 400 mg via INTRAVENOUS
  Filled 2021-11-04: qty 200

## 2021-11-04 MED ORDER — PROPOFOL 500 MG/50ML IV EMUL
INTRAVENOUS | Status: DC | PRN
  Administered 2021-11-04: 75 ug/kg/min via INTRAVENOUS

## 2021-11-04 MED ORDER — CHLORHEXIDINE GLUCONATE 0.12 % MT SOLN
15.0000 mL | Freq: Once | OROMUCOSAL | Status: AC
  Administered 2021-11-04: 15 mL via OROMUCOSAL
  Filled 2021-11-04: qty 15

## 2021-11-04 MED ORDER — LIDOCAINE HCL (PF) 1 % IJ SOLN
INTRAMUSCULAR | Status: AC
  Filled 2021-11-04: qty 30

## 2021-11-04 MED ORDER — BUPIVACAINE-EPINEPHRINE (PF) 0.25% -1:200000 IJ SOLN
INTRAMUSCULAR | Status: AC
  Filled 2021-11-04: qty 30

## 2021-11-04 MED ORDER — MIDAZOLAM HCL 2 MG/2ML IJ SOLN
INTRAMUSCULAR | Status: AC
  Filled 2021-11-04: qty 2

## 2021-11-04 MED ORDER — TRAMADOL HCL 50 MG PO TABS: 50.0000 mg | ORAL_TABLET | Freq: Four times a day (QID) | ORAL | 0 refills | Status: DC | PRN

## 2021-11-04 MED ORDER — PROPOFOL 10 MG/ML IV BOLUS
INTRAVENOUS | Status: AC
  Filled 2021-11-04: qty 20

## 2021-11-04 MED ORDER — MIDAZOLAM HCL 5 MG/5ML IJ SOLN
INTRAMUSCULAR | Status: DC | PRN
  Administered 2021-11-04: 2 mg via INTRAVENOUS

## 2021-11-04 MED ORDER — LIDOCAINE-EPINEPHRINE 1 %-1:100000 IJ SOLN
INTRAMUSCULAR | Status: AC
  Filled 2021-11-04: qty 1

## 2021-11-04 MED ORDER — ONDANSETRON HCL 4 MG/2ML IJ SOLN
INTRAMUSCULAR | Status: DC | PRN
  Administered 2021-11-04: 4 mg via INTRAVENOUS

## 2021-11-04 MED ORDER — ORAL CARE MOUTH RINSE: 15.0000 mL | Freq: Once | OROMUCOSAL | Status: AC

## 2021-11-04 MED ORDER — CHLORHEXIDINE GLUCONATE CLOTH 2 % EX PADS: 6.0000 | MEDICATED_PAD | Freq: Once | CUTANEOUS | Status: DC

## 2021-11-04 MED ORDER — ENSURE PRE-SURGERY PO LIQD: 296.0000 mL | Freq: Once | ORAL | Status: DC

## 2021-11-04 MED ORDER — ACETAMINOPHEN 500 MG PO TABS
1000.0000 mg | ORAL_TABLET | ORAL | Status: AC
  Administered 2021-11-04: 1000 mg via ORAL
  Filled 2021-11-04: qty 2

## 2021-11-04 MED ORDER — 0.9 % SODIUM CHLORIDE (POUR BTL) OPTIME
TOPICAL | Status: DC | PRN
  Administered 2021-11-04: 1000 mL

## 2021-11-04 MED ORDER — PROPOFOL 10 MG/ML IV BOLUS
INTRAVENOUS | Status: DC | PRN
  Administered 2021-11-04: 30 mg via INTRAVENOUS
  Administered 2021-11-04 (×2): 20 mg via INTRAVENOUS

## 2021-11-04 MED ORDER — FENTANYL CITRATE (PF) 250 MCG/5ML IJ SOLN
INTRAMUSCULAR | Status: AC
  Filled 2021-11-04: qty 5

## 2021-11-04 MED ORDER — FENTANYL CITRATE (PF) 100 MCG/2ML IJ SOLN
INTRAMUSCULAR | Status: AC
  Filled 2021-11-04: qty 2

## 2021-11-04 SURGICAL SUPPLY — 28 items
BAG COUNTER SPONGE SURGICOUNT (BAG) ×2 IMPLANT
CHLORAPREP W/TINT 10.5 ML (MISCELLANEOUS) ×2 IMPLANT
COVER SURGICAL LIGHT HANDLE (MISCELLANEOUS) ×2 IMPLANT
DECANTER SPIKE VIAL GLASS SM (MISCELLANEOUS) ×4 IMPLANT
DERMABOND ADVANCED (GAUZE/BANDAGES/DRESSINGS) ×1
DERMABOND ADVANCED .7 DNX12 (GAUZE/BANDAGES/DRESSINGS) ×1 IMPLANT
DRAPE LAPAROTOMY 100X72 PEDS (DRAPES) ×2 IMPLANT
ELECT CAUTERY BLADE 6.4 (BLADE) ×2 IMPLANT
ELECT REM PT RETURN 9FT ADLT (ELECTROSURGICAL) ×2
ELECTRODE REM PT RTRN 9FT ADLT (ELECTROSURGICAL) ×1 IMPLANT
GAUZE 4X4 16PLY ~~LOC~~+RFID DBL (SPONGE) ×2 IMPLANT
GLOVE SURG ENC MOIS LTX SZ6 (GLOVE) ×2 IMPLANT
GLOVE SURG UNDER LTX SZ6.5 (GLOVE) ×2 IMPLANT
GOWN STRL REUS W/ TWL LRG LVL3 (GOWN DISPOSABLE) ×1 IMPLANT
GOWN STRL REUS W/TWL 2XL LVL3 (GOWN DISPOSABLE) ×2 IMPLANT
GOWN STRL REUS W/TWL LRG LVL3 (GOWN DISPOSABLE) ×2
KIT BASIN OR (CUSTOM PROCEDURE TRAY) ×2 IMPLANT
KIT TURNOVER KIT B (KITS) ×2 IMPLANT
NEEDLE HYPO 25GX1X1/2 BEV (NEEDLE) ×2 IMPLANT
NS IRRIG 1000ML POUR BTL (IV SOLUTION) ×2 IMPLANT
PACK GENERAL/GYN (CUSTOM PROCEDURE TRAY) ×2 IMPLANT
PAD ARMBOARD 7.5X6 YLW CONV (MISCELLANEOUS) ×4 IMPLANT
SUT MON AB 4-0 PC3 18 (SUTURE) ×2 IMPLANT
SUT VIC AB 3-0 SH 27 (SUTURE) ×2
SUT VIC AB 3-0 SH 27X BRD (SUTURE) ×1 IMPLANT
SYR CONTROL 10ML LL (SYRINGE) ×2 IMPLANT
TOWEL GREEN STERILE (TOWEL DISPOSABLE) ×2 IMPLANT
TOWEL GREEN STERILE FF (TOWEL DISPOSABLE) ×2 IMPLANT

## 2021-11-04 NOTE — Discharge Instructions (Addendum)
[place] Office Phone Number [phone]   POST OP INSTRUCTIONS  Always review your discharge instruction sheet given to you by the facility where your surgery was performed.  IF YOU HAVE DISABILITY OR FAMILY LEAVE FORMS, YOU MUST BRING THEM TO THE OFFICE FOR PROCESSING.  DO NOT GIVE THEM TO YOUR DOCTOR.  A prescription for pain medication may be given to you upon discharge.  Take your pain medication as prescribed, if needed.  If narcotic pain medicine is not needed, then you may take acetaminophen (Tylenol) or ibuprofen (Advil) as needed. Take your usually prescribed medications unless otherwise directed If you need a refill on your pain medication, please contact your pharmacy.  They will contact our office to request authorization.  Prescriptions will not be filled after 5pm or on week-ends. You should eat very light the first 24 hours after surgery, such as soup, crackers, pudding, etc.  Resume your normal diet the day after surgery It is common to experience some constipation if taking pain medication after surgery.  Increasing fluid intake and taking a stool softener will usually help or prevent this problem from occurring.  A mild laxative (Milk of Magnesia or Miralax) should be taken according to package directions if there are no bowel movements after 48 hours. You may shower in 48 hours.  The surgical glue will flake off in 2-3 weeks.   ACTIVITIES:  No strenuous activity or heavy lifting for 1 week.   You may drive when you no longer are taking prescription pain medication, you can comfortably wear a seatbelt, and you can safely maneuver your car and apply brakes. RETURN TO WORK:  __________1 week_______________ Dennis Bast should see your doctor in the office for a follow-up appointment approximately three-four weeks after your surgery.    WHEN TO CALL YOUR DOCTOR: Fever over 101.0 Nausea and/or vomiting. Extreme swelling or bruising. Continued bleeding from  incision. Increased pain, redness, or drainage from the incision.  The clinic staff is available to answer your questions during regular business hours.  Please don't hesitate to call and ask to speak to one of the nurses for clinical concerns.  If you have a medical emergency, go to the nearest emergency room or call 911.  A surgeon from [facility] is always on call at the hospital.  For further questions, please visit [email or web address]

## 2021-11-04 NOTE — Op Note (Signed)
  PRE-OPERATIVE DIAGNOSIS:  un-needed Port-A-Cath for left breast cancer  POST-OPERATIVE DIAGNOSIS:  Same   PROCEDURE:  Procedure(s):  REMOVAL PORT-A-CATH right subclavian location  SURGEON:  Surgeon(s):  Stark Klein, MD  ANESTHESIA:   MAC + local  EBL:   Minimal  SPECIMEN:  None  Complications : none known  Procedure:   Pt was  identified in the holding area and taken to the operating room where she was placed supine on the operating room table.  MAC anesthesia was induced.  The right upper chest was prepped and draped.  The prior incision was anesthetized with local anesthetic.  The incision was opened with a #15 blade.  The subcutaneous tissue was divided with the cautery.  The port was identified and the capsule opened.  The four 2-0 prolene sutures were removed.  The port was then removed and pressure held on the tract.  The catheter appeared intact without evidence of breakage, length was 17.5 cm.  The wound was inspected for hemostasis, which was achieved with cautery.  The wound was closed with 3-0 vicryl deep dermal interrupted sutures and 4-0 Monocryl running subcuticular suture.  The wound was cleaned, dried, and dressed with dermabond.  The patient was awakened from anesthesia and taken to the PACU in stable condition.  Needle, sponge, and instrument counts are correct.

## 2021-11-04 NOTE — H&P (Signed)
Aimee Mcclain is an 65 y.o. female.   Chief Complaint: left breast cancer HPI:  Pt is s/p breast conservation for left breast cancer.  She has completed chemo and desires port removal.    Past Medical History:  Diagnosis Date   Arthritis    Breast cancer ([place])    Cataract    Colon polyps    Endometriosis    Family history of colon cancer    Family history of colonic polyps    Fuchs' corneal dystrophy    GERD (gastroesophageal reflux disease)    H/O bilateral breast implants    History of colon polyps    HPV in female    HSV (herpes simplex virus) infection    Migraine    no longer having migraines   S/P TAH (total abdominal hysterectomy) 1991    Past Surgical History:  Procedure Laterality Date   ABDOMINAL HYSTERECTOMY     BREAST BIOPSY     Benign.   BREAST ENHANCEMENT SURGERY     BREAST LUMPECTOMY Bilateral 1993 and 1995   BREAST LUMPECTOMY WITH RADIOACTIVE SEED AND SENTINEL LYMPH NODE BIOPSY Left 02/13/2021   Procedure: LEFT BREAST LUMPECTOMY WITH RADIOACTIVE SEED AND SENTINEL LYMPH NODE BIOPSY WITH SEED TARGETED NODE;  Surgeon: Stark Klein, MD;  Location: [facility];  Service: General;  Laterality: Left;   CATARACT EXTRACTION, BILATERAL     CESAREAN SECTION     PARTIAL HYSTERECTOMY     PORTACATH PLACEMENT Right 02/13/2021   Procedure: INSERTION PORT-A-CATH;  Surgeon: Stark Klein, MD;  Location: [facility];  Service: General;  Laterality: Right;   TONSILLECTOMY     WISDOM TOOTH EXTRACTION      Family History  Problem Relation Age of Onset   Dementia Maternal Grandmother    Heart disease Maternal Grandfather    Colon cancer Maternal Grandfather        dx over 65   Colon cancer Father        d. 68   Heart disease Mother    Colon cancer Mother        dx late 41s-70s   Rectal cancer Mother 1   Diabetes Brother    Colon polyps Brother    Diabetes Brother    Colon polyps Brother    Lymphoma Maternal Aunt    Heart disease  Maternal Uncle    Crohn's disease Daughter    Esophageal cancer Neg Hx    Stomach cancer Neg Hx    Breast cancer Neg Hx    Social History:  reports that she quit smoking about 27 years ago. Her smoking use included cigarettes. She has a 5.00 pack-year smoking history. She has never used smokeless tobacco. She reports current alcohol use. She reports that she does not use drugs.  Allergies:  Allergies  Allergen Reactions   Ibuprofen Swelling    She has tolerated aleve   Omnicef [Cefdinir] Itching   Proton Pump Inhibitors     Headache, but Can tolerate prilosec   Roxicodone [Oxycodone Hcl] Rash    Developed rash on face and chest   Vioxx [Rofecoxib] Swelling and Rash    Medications Prior to Admission  Medication Sig Dispense Refill   acetaminophen (TYLENOL) 500 MG tablet Take 1,000 mg by mouth every 6 (six) hours as needed for moderate pain.     Calcium Carbonate-Vitamin D (CALCIUM 600+D PO) Take 1 tablet by mouth daily.     cholecalciferol (VITAMIN D3) 25 MCG (1000 UNIT) tablet Take  1,000 Units by mouth daily.     exemestane (AROMASIN) 25 MG tablet Take 1 tablet (25 mg total) by mouth daily after breakfast. 30 tablet 3   lactulose (CHRONULAC) 10 GM/15ML solution Take 30 mLs (20 g total) by mouth every 4 (four) hours as needed for mild constipation. 236 mL 0   Multiple Vitamin (MULTIVITAMIN) tablet Take 1 tablet by mouth daily.     omeprazole (PRILOSEC) 20 MG capsule Take 20 mg by mouth daily.     Pseudoeph-Doxylamine-DM-APAP (DAYQUIL/NYQUIL COLD/FLU RELIEF PO) Take by mouth as needed.     Vitamin B Complex-C CAPS Take 1 capsule by mouth.     doxycycline (VIBRA-TABS) 100 MG tablet Take 1 tablet (100 mg total) by mouth 2 (two) times daily. (Patient not taking: Reported on 10/29/2021) 20 tablet 0   gabapentin (NEURONTIN) 100 MG capsule Take 1 capsule (100 mg total) by mouth 3 (three) times daily. (Patient not taking: No sig reported) 60 capsule 0   lidocaine-prilocaine (EMLA) cream  Apply 1 application topically as needed. (Patient not taking: Reported on 10/29/2021) 30 g 2   lisinopril (ZESTRIL) 5 MG tablet Take 1 tablet (5 mg total) by mouth daily. (Patient not taking: No sig reported) 90 tablet 3   ondansetron (ZOFRAN) 8 MG tablet Take 1 tablet (8 mg total) by mouth every 8 (eight) hours as needed for nausea or vomiting. (Patient not taking: No sig reported) 30 tablet 2   prochlorperazine (COMPAZINE) 10 MG tablet Take 1 tablet (10 mg total) by mouth every 6 (six) hours as needed. (Patient not taking: No sig reported) 30 tablet 2   traMADol (ULTRAM) 50 MG tablet Take 1 tablet (50 mg total) by mouth every 6 (six) hours as needed. (Patient not taking: No sig reported) 10 tablet 0   valACYclovir (VALTREX) 500 MG tablet Take 1 tablet (500 mg total) by mouth 2 (two) times daily as needed.      No results found for this or any previous visit (from the past 48 hour(s)). No results found.  Review of Systems  All other systems reviewed and are negative.  Blood pressure (!) 148/75, pulse 79, temperature 98.7 F (37.1 C), temperature source Oral, resp. rate 17, height 5\' 7"  (1.702 m), weight 80.7 kg, SpO2 100 %. Physical Exam Vitals reviewed.  Constitutional:      Appearance: Normal appearance. She is normal weight.  HENT:     Head: Normocephalic and atraumatic.     Mouth/Throat:     Mouth: Mucous membranes are moist.  Cardiovascular:     Rate and Rhythm: Normal rate and regular rhythm.  Pulmonary:     Effort: Pulmonary effort is normal.     Comments: Right subclavian port in place. Abdominal:     General: Abdomen is flat.  Musculoskeletal:     Cervical back: Normal range of motion and neck supple.  Skin:    General: Skin is warm and dry.     Capillary Refill: Capillary refill takes 2 to 3 seconds.  Neurological:     General: No focal deficit present.     Mental Status: She is alert and oriented to person, place, and time.  Psychiatric:        Mood and Affect:  Mood normal.        Behavior: Behavior normal.        Thought Content: Thought content normal.        Judgment: Judgment normal.     Assessment/Plan Port in place Left breast  cancer  Discussed port removal including the surgery, risks, post op restrictions. Pt wishes to proceed.   Stark Klein, MD 11/04/2021, 9:32 AM

## 2021-11-04 NOTE — Transfer of Care (Signed)
Immediate Anesthesia Transfer of Care Note  Patient: Aimee Mcclain  Procedure(s) Performed: REMOVAL PORT-A-CATH (Right: Chest)  Patient Location: PACU  Anesthesia Type:MAC  Level of Consciousness: awake, alert  and oriented  Airway & Oxygen Therapy: Patient Spontanous Breathing  Post-op Assessment: Report given to RN and Post -op Vital signs reviewed and stable  Post vital signs: Reviewed and stable  Last Vitals:  Vitals Value Taken Time  BP 141/65 11/04/21 1025  Temp    Pulse 70 11/04/21 1027  Resp 16 11/04/21 1027  SpO2 98 % 11/04/21 1027  Vitals shown include unvalidated device data.  Last Pain:  Vitals:   11/04/21 0729  TempSrc:   PainSc: 0-No pain      Patients Stated Pain Goal: 0 (12/22/30 3557)  Complications: No notable events documented.

## 2021-11-04 NOTE — Anesthesia Preprocedure Evaluation (Signed)
Anesthesia Evaluation  Patient identified by MRN, date of birth, ID band Patient awake    Reviewed: Allergy & Precautions, NPO status , Patient's Chart, lab work & pertinent test results  Airway Mallampati: II  TM Distance: >3 FB Neck ROM: Full    Dental no notable dental hx. (+) Teeth Intact, Dental Advisory Given   Pulmonary former smoker,    Pulmonary exam normal breath sounds clear to auscultation       Cardiovascular hypertension, Normal cardiovascular exam Rhythm:Regular Rate:Normal     Neuro/Psych  Headaches, negative psych ROS   GI/Hepatic Neg liver ROS, GERD  Medicated and Controlled,  Endo/Other  negative endocrine ROSHx/o left breast Ca S/P surgery, chemo Rx and RT  Renal/GU negative Renal ROS  negative genitourinary   Musculoskeletal  (+) Arthritis , Osteoarthritis,    Abdominal   Peds  Hematology negative hematology ROS (+)   Anesthesia Other Findings   Reproductive/Obstetrics                             Anesthesia Physical Anesthesia Plan  ASA: 2  Anesthesia Plan: MAC   Post-op Pain Management:    Induction: Intravenous  PONV Risk Score and Plan: 2 and Treatment may vary due to age or medical condition  Airway Management Planned: Natural Airway and Simple Face Mask  Additional Equipment:   Intra-op Plan:   Post-operative Plan:   Informed Consent: I have reviewed the patients History and Physical, chart, labs and discussed the procedure including the risks, benefits and alternatives for the proposed anesthesia with the patient or authorized representative who has indicated his/her understanding and acceptance.     Dental advisory given  Plan Discussed with: CRNA and Anesthesiologist  Anesthesia Plan Comments:         Anesthesia Quick Evaluation

## 2021-11-04 NOTE — Anesthesia Procedure Notes (Signed)
Procedure Name: MAC Date/Time: 11/04/2021 9:40 AM Performed by: Carolan Clines, CRNA Pre-anesthesia Checklist: Patient identified, Emergency Drugs available, Suction available, Patient being monitored and Timeout performed Patient Re-evaluated:Patient Re-evaluated prior to induction Oxygen Delivery Method: Simple face mask Dental Injury: Teeth and Oropharynx as per pre-operative assessment

## 2021-11-04 NOTE — Anesthesia Postprocedure Evaluation (Signed)
Anesthesia Post Note  Patient: Aimee Mcclain  Procedure(s) Performed: REMOVAL PORT-A-CATH (Right: Chest)     Patient location during evaluation: PACU Anesthesia Type: MAC Level of consciousness: awake and alert and oriented Pain management: pain level controlled Vital Signs Assessment: post-procedure vital signs reviewed and stable Respiratory status: spontaneous breathing, nonlabored ventilation and respiratory function stable Cardiovascular status: stable and blood pressure returned to baseline Postop Assessment: no apparent nausea or vomiting Anesthetic complications: no   No notable events documented.  Last Vitals:  Vitals:   11/04/21 1040 11/04/21 1055  BP: (!) 156/80 (!) 156/80  Pulse: 69 68  Resp: 17 10  Temp:  36.5 C  SpO2: 100% 99%    Last Pain:  Vitals:   11/04/21 1055  TempSrc:   PainSc: 2                  Lundon Verdejo A.

## 2021-11-05 ENCOUNTER — Encounter (HOSPITAL_COMMUNITY): Payer: Self-pay | Admitting: General Surgery

## 2021-11-10 ENCOUNTER — Encounter: Payer: Self-pay | Admitting: Nurse Practitioner

## 2021-11-13 ENCOUNTER — Encounter: Payer: Self-pay | Admitting: Nurse Practitioner

## 2021-11-13 ENCOUNTER — Encounter: Payer: Self-pay | Admitting: *Deleted

## 2021-11-13 ENCOUNTER — Inpatient Hospital Stay: Payer: Medicare Other | Attending: Hematology

## 2021-11-13 ENCOUNTER — Ambulatory Visit (HOSPITAL_COMMUNITY)
Admission: RE | Admit: 2021-11-13 | Discharge: 2021-11-13 | Disposition: A | Discharge status: Home / Self Care | Payer: Medicare Other | Source: Ambulatory Visit | Attending: Hematology | Admitting: Hematology

## 2021-11-13 ENCOUNTER — Inpatient Hospital Stay (HOSPITAL_BASED_OUTPATIENT_CLINIC_OR_DEPARTMENT_OTHER): Payer: Medicare Other | Admitting: Nurse Practitioner

## 2021-11-13 ENCOUNTER — Other Ambulatory Visit: Payer: Self-pay

## 2021-11-13 VITALS — BP 129/68 | HR 66 | Temp 97.0°F | Resp 20 | Wt 180.6 lb

## 2021-11-13 DIAGNOSIS — I10 Essential (primary) hypertension: Secondary | ICD-10-CM | POA: Diagnosis not present

## 2021-11-13 DIAGNOSIS — C50212 Malignant neoplasm of upper-inner quadrant of left female breast: Secondary | ICD-10-CM | POA: Diagnosis present

## 2021-11-13 DIAGNOSIS — I429 Cardiomyopathy, unspecified: Secondary | ICD-10-CM | POA: Diagnosis not present

## 2021-11-13 DIAGNOSIS — T451X5A Adverse effect of antineoplastic and immunosuppressive drugs, initial encounter: Secondary | ICD-10-CM | POA: Diagnosis not present

## 2021-11-13 DIAGNOSIS — Z170 Estrogen receptor positive status [ER+]: Secondary | ICD-10-CM | POA: Insufficient documentation

## 2021-11-13 DIAGNOSIS — Z0189 Encounter for other specified special examinations: Secondary | ICD-10-CM | POA: Diagnosis not present

## 2021-11-13 DIAGNOSIS — Z9221 Personal history of antineoplastic chemotherapy: Secondary | ICD-10-CM | POA: Diagnosis not present

## 2021-11-13 DIAGNOSIS — Z5112 Encounter for antineoplastic immunotherapy: Secondary | ICD-10-CM | POA: Insufficient documentation

## 2021-11-13 DIAGNOSIS — Z923 Personal history of irradiation: Secondary | ICD-10-CM | POA: Diagnosis not present

## 2021-11-13 DIAGNOSIS — I427 Cardiomyopathy due to drug and external agent: Secondary | ICD-10-CM | POA: Diagnosis not present

## 2021-11-13 DIAGNOSIS — Z79899 Other long term (current) drug therapy: Secondary | ICD-10-CM | POA: Insufficient documentation

## 2021-11-13 LAB — ECHOCARDIOGRAM COMPLETE
Area-P 1/2: 3.77 cm2
S' Lateral: 2.9 cm

## 2021-11-13 MED ORDER — TRASTUZUMAB-HYALURONIDASE-OYSK 600-10000 MG-UNT/5ML ~~LOC~~ SOLN
600.0000 mg | Freq: Once | SUBCUTANEOUS | Status: AC
  Administered 2021-11-13: 600 mg via SUBCUTANEOUS
  Filled 2021-11-13: qty 5

## 2021-11-13 MED ORDER — DIPHENHYDRAMINE HCL 25 MG PO CAPS
50.0000 mg | ORAL_CAPSULE | Freq: Once | ORAL | Status: AC
  Administered 2021-11-13: 50 mg via ORAL
  Filled 2021-11-13: qty 2

## 2021-11-13 MED ORDER — ACETAMINOPHEN 325 MG PO TABS
650.0000 mg | ORAL_TABLET | Freq: Once | ORAL | Status: AC
  Administered 2021-11-13: 650 mg via ORAL
  Filled 2021-11-13: qty 2

## 2021-11-13 MED ORDER — EXEMESTANE 25 MG PO TABS: 25.0000 mg | ORAL_TABLET | Freq: Every day | ORAL | 3 refills | Status: DC

## 2021-11-13 NOTE — Progress Notes (Signed)
CLINIC:  Survivorship   Patient Care Team: [name], MD as PCP - General (Family Medicine) Rockwell Germany, RN as Oncology Nurse Navigator Mauro Kaufmann, RN as Oncology Nurse Navigator Stark Klein, MD as Consulting Physician (General Surgery) Truitt Merle, MD as Consulting Physician (Hematology) Eppie Gibson, MD as Attending Physician (Radiation Oncology) Delsa Bern, MD as Consulting Physician (Obstetrics and Gynecology) Alla Feeling, NP as Nurse Practitioner (Nurse Practitioner)   REASON FOR VISIT:  Routine follow-up post-treatment for a recent history of breast cancer.  BRIEF ONCOLOGIC HISTORY:  Oncology History Overview Note  Cancer Staging Malignant neoplasm of upper-inner quadrant of left breast in female, estrogen receptor positive ([place]) Staging form: Breast, AJCC 8th Edition - Clinical stage from 01/14/2020: Stage IB (cT1c, cN1, cM0, G3, ER+, PR+, HER2+) - Signed by Truitt Merle, MD on 01/21/2021 Stage prefix: Initial diagnosis - Pathologic stage from 02/13/2021: Stage IA (pT2, pN0, cM0, G3, ER+, PR+, HER2+) - Signed by Heilingoetter, Cassandra L, PA-C on 03/07/2021 Stage prefix: Initial diagnosis Nuclear grade: G3 Histologic grading system: 3 grade system    Malignant neoplasm of upper-inner quadrant of left breast in female, estrogen receptor positive ([place])  01/14/2020 Cancer Staging   Staging form: Breast, AJCC 8th Edition - Clinical stage from 01/14/2020: Stage IB (cT1c, cN1, cM0, G3, ER+, PR+, HER2+) - Signed by Truitt Merle, MD on 01/21/2021 Stage prefix: Initial diagnosis    01/02/2021 Mammogram   Left breast with 1.9x1x1.8 cm complex cyst at 12:00 position, 4cmfn, versus saloid mass with calcifications, irregulr, increased in size. Biopsy recommended.    01/13/2021 Initial Biopsy   Diagnosis 1. Breast, left, needle core biopsy, 11 o'clock, 4cmfn - INVASIVE DUCTAL CARCINOMA - SEE COMMENT 2. Lymph node, needle/core biopsy, left axilla - FIBROVASCULAR AND  ADIPOSE TISSUE - NO LYMPHOID TISSUE OR CARCINOMA IDENTIFIED Microscopic Comment 1. Based on the biopsy, the carcinoma appears Nottingham grade 3 of 3 and measures 1.2 cm in greatest linear extent. Prognostic markers (ER/PR/ki-67/HER2) are pending and will be reported in an addendum. Dr. Jeannie Done reviewed the case and agrees with the above diagnosis. These results were called to [facility] on January 14, 2021   01/13/2021 Receptors her2   1. PROGNOSTIC INDICATORS Results: IMMUNOHISTOCHEMICAL AND MORPHOMETRIC ANALYSIS PERFORMED MANUALLY The tumor cells are POSITIVE for Her2 (3+). Estrogen Receptor: 95%, POSITIVE, STRONG STAINING INTENSITY Progesterone Receptor: 85%, POSITIVE, STRONG STAINING INTENSITY Proliferation Marker Ki67: 30%   01/16/2021 Initial Diagnosis   Malignant neoplasm of upper-inner quadrant of left breast in female, estrogen receptor positive ([place])   01/23/2021 Imaging   MRI Breast  IMPRESSION: 1. Known malignancy measuring 2.9 centimeters in the UPPER INNER QUADRANT of the LEFT breast. There is no evidence for involvement of the pectoralis muscle or implant. 2. No evidence for lymphadenopathy. 3. RIGHT breast is negative. 4. Bilateral retropectoral saline implants.   02/09/2021 Imaging   MRI Brain IMPRESSION: No evidence of intracranial metastatic disease.   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     02/13/2021 Surgery   LEFT BREAST LUMPECTOMY WITH RADIOACTIVE SEED AND SENTINEL LYMPH NODE BIOPSY WITH SEED TARGETED NODE and PAC Placed by Dr Barry Dienes   02/13/2021 Pathology Results   FINAL MICROSCOPIC DIAGNOSIS:   A. BREAST, LEFT, LUMPECTOMY:  - Invasive ductal carcinoma, 2.6 cm, Nottingham grade 3 of 3.  - Ductal carcinoma in situ, high grade with central necrosis.  - Margins of resection:       - Invasive carcinoma broadly involves the anterior and inferior  margins.  Invasive carcinoma is focally < 1 mm from each the superior  and medial margins.        - DCIS is focally < 1 mm from the inferior margin.  DCIS is focally  1 mm from each the anterior and superior margins.  DCIS is focally 1-2  mm from the medial margin.  - Biopsy clip.  - See oncology table.   B. BREAST, LEFT, ANTERIOR TISSUE, EXCISION:  - Residual invasive and in-situ ductal carcinoma.  - Residual invasive ductal carcinoma broadly involves the posterior  margin.  - Residual DCIS is focally 1 mm from the posterior margin.   C. BREAST, LEFT, INFERIOR MARGIN, EXCISION:  - No carcinoma identified.   D. SENTINEL LYMPH NODE, LEFT AXILLARY #1, BIOPSY:  - No carcinoma identified in one lymph node (0/1).   E. SENTINEL LYMPH NODE, LEFT AXILLARY #2, BIOPSY:  - Biopsy site.  - No distinct nodal tissue identified.  - No carcinoma identified.   F. SENTINEL LYMPH NODE, LEFT AXILLARY #3, BIOPSY:  - No carcinoma identified in one lymph node (0/1).   G. SENTINEL LYMPH NODE, LEFT AXILLARY #4, BIOPSY:  - No carcinoma identified in one lymph node (0/1).    02/13/2021 Cancer Staging   Staging form: Breast, AJCC 8th Edition - Pathologic stage from 02/13/2021: Stage IA (pT2, pN0, cM0, G3, ER+, PR+, HER2+) - Signed by Heilingoetter, Cassandra L, PA-C on 03/07/2021 Stage prefix: Initial diagnosis Nuclear grade: G3 Histologic grading system: 3 grade system    03/18/2021 -  Chemotherapy   Patient is on Treatment Plan : BREAST Paclitaxel + Trastuzumab q7d / Trastuzumab q21d     06/2021 - 07/2021 Radiation Therapy   Adjuvant radiation per Dr. Squire   08/20/2021 Imaging   CT Chest w/o contrast  IMPRESSION: No definite evidence of pulmonary metastatic disease.   5 mm indeterminate right lower lobe pulmonary nodule. Comparison with prior examinations, if available, is recommended to determine chronicity. No follow-up needed if patient is low-risk. In absence of priors, Non-contrast chest CT can be considered in 12 months if patient is high-risk. This recommendation follows the consensus  statement: Guidelines for Management of Incidental Pulmonary Nodules Detected on CT Images: From the Fleischner Society 2017; Radiology 2017; 284:228-243.   Mild coronary artery calcification.   08/20/2021 Imaging   CT Head w/o contrast  IMPRESSION: No acute abnormality. No evidence of intracranial metastatic disease.   08/2021 -  Anti-estrogen oral therapy   Began adjuvant exemestane   11/13/2021 Survivorship   SCP delivered by [name] Burton, NP     INTERVAL HISTORY:  Aimee Mcclain presents to the Survivorship Clinic today for our initial meeting to review her survivorship care plan detailing her treatment course for breast cancer, as well as monitoring long-term side effects of that treatment, education regarding health maintenance, screening, and overall wellness and health promotion.     She feels well overall, continues every 3 week Herceptin injection.  Port-A-Cath was removed which she tolerated well.  He is taking exemestane.  She had severe constipation that has nearly resolved with meds and diet.  Hot flashes are moderate, worse in the last 1-2 weeks.  She continues to have mild residual intermittent neuropathy, she feels it has spread to the back of her feet, more bothersome at night but not affecting balance or function.  She had stopped taking gabapentin.  She has left greater than right hip pain that occasionally affects the entire left leg with a sharp intermittent pains.    Hip pain occasionally wakes her up at night and is worse after prolonged sitting or favoring that side.  Overall this is mild, not limiting function or mobility.  Resolves with Tylenol as needed.  She has continuous sinus drainage and bloodshot eyes, Claritin helped yesterday.  She is using saline nasal spray that does not help much.  Otherwise denies recent fever, chills, cough, chest pain, dyspnea, leg edema, breast concerns such as new lump/mass, nipple discharge or inversion, or skin change.  ONCOLOGY  TREATMENT TEAM:  1. Surgeon:  Dr. Byerly at [facility] 2. Medical Oncologist: Dr. Feng 3. Radiation Oncologist: Dr. Squire    PAST MEDICAL/SURGICAL HISTORY:  Past Medical History:  Diagnosis Date   Arthritis    Breast cancer (HCC)    Cataract    Colon polyps    Endometriosis    Family history of colon cancer    Family history of colonic polyps    Fuchs' corneal dystrophy    GERD (gastroesophageal reflux disease)    H/O bilateral breast implants    History of colon polyps    HPV in female    HSV (herpes simplex virus) infection    Migraine    no longer having migraines   S/P TAH (total abdominal hysterectomy) 1991   Past Surgical History:  Procedure Laterality Date   ABDOMINAL HYSTERECTOMY     BREAST BIOPSY     Benign.   BREAST ENHANCEMENT SURGERY     BREAST LUMPECTOMY Bilateral 1993 and 1995   BREAST LUMPECTOMY WITH RADIOACTIVE SEED AND SENTINEL LYMPH NODE BIOPSY Left 02/13/2021   Procedure: LEFT BREAST LUMPECTOMY WITH RADIOACTIVE SEED AND SENTINEL LYMPH NODE BIOPSY WITH SEED TARGETED NODE;  Surgeon: Byerly, Faera, MD;  Location: [facility];  Service: General;  Laterality: Left;   CATARACT EXTRACTION, BILATERAL     CESAREAN SECTION     PARTIAL HYSTERECTOMY     PORT-A-CATH REMOVAL Right 11/04/2021   Procedure: REMOVAL PORT-A-CATH;  Surgeon: Byerly, Faera, MD;  Location: [facility] OR;  Service: General;  Laterality: Right;   PORTACATH PLACEMENT Right 02/13/2021   Procedure: INSERTION PORT-A-CATH;  Surgeon: Byerly, Faera, MD;  Location: [facility];  Service: General;  Laterality: Right;   TONSILLECTOMY     WISDOM TOOTH EXTRACTION       ALLERGIES:  Allergies  Allergen Reactions   Ibuprofen Swelling    She has tolerated aleve   Omnicef [Cefdinir] Itching   Proton Pump Inhibitors     Headache, but Can tolerate prilosec   Roxicodone [Oxycodone Hcl] Rash    Developed rash on face and chest   Vioxx [Rofecoxib] Swelling and Rash      CURRENT MEDICATIONS:  Outpatient Encounter Medications as of 11/13/2021  Medication Sig   acetaminophen (TYLENOL) 500 MG tablet Take 1,000 mg by mouth every 6 (six) hours as needed for moderate pain.   Calcium Carbonate-Vitamin D (CALCIUM 600+D PO) Take 1 tablet by mouth daily.   cholecalciferol (VITAMIN D3) 25 MCG (1000 UNIT) tablet Take 1,000 Units by mouth daily.   doxycycline (VIBRA-TABS) 100 MG tablet Take 1 tablet (100 mg total) by mouth 2 (two) times daily. (Patient not taking: Reported on 10/29/2021)   exemestane (AROMASIN) 25 MG tablet Take 1 tablet (25 mg total) by mouth daily after breakfast.   gabapentin (NEURONTIN) 100 MG capsule Take 1 capsule (100 mg total) by mouth 3 (three) times daily. (Patient not taking: No sig reported)   lactulose (CHRONULAC) 10 GM/15ML solution Take 30 mLs (  20 g total) by mouth every 4 (four) hours as needed for mild constipation.   lidocaine-prilocaine (EMLA) cream Apply 1 application topically as needed. (Patient not taking: Reported on 10/29/2021)   lisinopril (ZESTRIL) 5 MG tablet Take 1 tablet (5 mg total) by mouth daily. (Patient not taking: No sig reported)   Multiple Vitamin (MULTIVITAMIN) tablet Take 1 tablet by mouth daily.   omeprazole (PRILOSEC) 20 MG capsule Take 20 mg by mouth daily.   ondansetron (ZOFRAN) 8 MG tablet Take 1 tablet (8 mg total) by mouth every 8 (eight) hours as needed for nausea or vomiting. (Patient not taking: No sig reported)   prochlorperazine (COMPAZINE) 10 MG tablet Take 1 tablet (10 mg total) by mouth every 6 (six) hours as needed. (Patient not taking: No sig reported)   Pseudoeph-Doxylamine-DM-APAP (DAYQUIL/NYQUIL COLD/FLU RELIEF PO) Take by mouth as needed.   traMADol (ULTRAM) 50 MG tablet Take 1 tablet (50 mg total) by mouth every 6 (six) hours as needed.   valACYclovir (VALTREX) 500 MG tablet Take 1 tablet (500 mg total) by mouth 2 (two) times daily as needed.   Vitamin B Complex-C CAPS Take 1 capsule by  mouth.   [DISCONTINUED] exemestane (AROMASIN) 25 MG tablet Take 1 tablet (25 mg total) by mouth daily after breakfast.   No facility-administered encounter medications on file as of 11/13/2021.     ONCOLOGIC FAMILY HISTORY:  Family History  Problem Relation Age of Onset   Dementia Maternal Grandmother    Heart disease Maternal Grandfather    Colon cancer Maternal Grandfather        dx over 75   Colon cancer Father        d. 7   Heart disease Mother    Colon cancer Mother        dx late 38s-70s   Rectal cancer Mother 29   Diabetes Brother    Colon polyps Brother    Diabetes Brother    Colon polyps Brother    Lymphoma Maternal Aunt    Heart disease Maternal Uncle    Crohn's disease Daughter    Esophageal cancer Neg Hx    Stomach cancer Neg Hx    Breast cancer Neg Hx      GENETIC COUNSELING/TESTING: Yes, 01/12/2022 via [facility] multi cancer panel, no deleterious mutations  SOCIAL HISTORY:  Aimee Mcclain is married and lives with her spouse in [place]. She denies any current tobacco use, quit smoking 29 years ago.  Does not drink alcohol or use illicit drugs.   PHYSICAL EXAMINATION:  Vital Signs:   Vitals:   11/13/21 0907  BP: 129/68  Pulse: 66  Resp: 20  Temp: (!) 97 F (36.1 C)  SpO2: 100%   Filed Weights   11/13/21 0907  Weight: 180 lb 9.6 oz (81.9 kg)   General: Well-nourished, well-appearing female in no acute distress.   HEENT: Sclerae anicteric. Lymph: No cervical, supraclavicular, or infraclavicular lymphadenopathy noted on palpation.  Cardiovascular: Regular rate and rhythm. Respiratory: Clear to auscultation bilaterally. breathing non-labored.  GI: Abdomen soft and round; non-tender, non-distended. Bowel sounds normoactive.  Neuro: No focal deficits. Steady gait.  Psych: Mood and affect normal and appropriate for situation.  Extremities: No edema. MSK: No focal spinal tenderness to palpation.  Full range of motion in bilateral upper  extremities Skin: Warm and dry. Breast exam: Breasts are symmetrical without nipple discharge or inversion.  S/p left lumpectomy, incisions completely healed.  There is mild diffuse hyperpigmentation.  No palpable mass  in either breast or axilla that I could appreciate. Right chest PAC removed, incision closed with Dermabond  LABORATORY DATA:  CBC Latest Ref Rng & Units 10/24/2021 08/22/2021 06/20/2021  WBC 4.0 - 10.5 K/uL 6.1 4.2 6.1  Hemoglobin 12.0 - 15.0 g/dL 12.8 14.2 13.0  Hematocrit 36.0 - 46.0 % 38.8 41.5 39.1  Platelets 150 - 400 K/uL 230 170 216    CMP Latest Ref Rng & Units 10/24/2021 08/22/2021 06/20/2021  Glucose 70 - 99 mg/dL 95 88 89  BUN 8 - 23 mg/dL 13 17 14  Creatinine 0.44 - 1.00 mg/dL 0.78 0.96 0.79  Sodium 135 - 145 mmol/L 142 141 143  Potassium 3.5 - 5.1 mmol/L 4.0 4.2 4.1  Chloride 98 - 111 mmol/L 107 107 108  CO2 22 - 32 mmol/L 27 23 26  Calcium 8.9 - 10.3 mg/dL 9.6 10.1 9.3  Total Protein 6.5 - 8.1 g/dL 7.2 7.3 7.1  Total Bilirubin 0.3 - 1.2 mg/dL 0.4 0.4 0.5  Alkaline Phos 38 - 126 U/L 97 86 101  AST 15 - 41 U/L 19 30 25  ALT 0 - 44 U/L 15 26 24     DIAGNOSTIC IMAGING:  None for this visit.      ASSESSMENT AND PLAN:  Ms.. Mcclain is a pleasant 65 y.o. female with Stage 1A left breast invasive ductal carcinoma, ER+/PR+/HER2+, diagnosed in 12/2020, treated with lumpectomy, adjuvant chemotherapy, adjuvant radiation therapy, and anti-estrogen therapy with exemestane beginning in 08/2021.  She presents to the Survivorship Clinic for our initial meeting and routine follow-up post-completion of treatment for breast cancer.    1. Stage 1A left breast cancer:  Aimee Mcclain is continuing to recover from definitive treatment for breast cancer.  Exam is benign, no clinical concern for recurrence.  She continues every 3 week Herceptin to complete 1 year in 03/2022. She will follow-up with her medical oncologist, Dr. Feng in 12 weeks with history and physical exam per  surveillance protocol.  She will continue her anti-estrogen therapy with exemestane. Thus far, she is tolerating moderately well with left greater than right hip and leg pain, hot flashes, and sinus congestion.  She also has mild intermittent residual CIPN, I recommend to resume gabapentin 100 mg nightly for hot flashes and neuropathy.  We reviewed symptom management. She was instructed to make Dr. Feng or myself aware if she begins to experience any worsening side effects of the medication and I could see her back in clinic to help manage those side effects, as needed.  First mammogram schedule 12/16/2021 at [facility].  Today, a comprehensive survivorship care plan and treatment summary was reviewed with the patient today detailing her breast cancer diagnosis, treatment course, potential late/long-term effects of treatment, appropriate follow-up care with recommendations for the future, and patient education resources.  A copy of this summary, along with a letter will be sent to the patient's primary care provider via In Basket message after today's visit.    2. Bone health:  Given Aimee Mcclain age/history of breast cancer and her current treatment regimen including anti-estrogen therapy with exemestane, she is at risk for bone demineralization.  Her last DEXA scan was 06/24/2021 which showed osteopenia, lowest T score -1.3.  I recommend daily calcium and vitamin D, she was encouraged to increase her weight-bearing activities.  She was given education on specific activities to promote bone health.  3. Cancer screening:  Due to Aimee Mcclain history and her age, she should receive screening for skin cancers and   colon cancer.  She is s/p partial hysterectomy, ovaries remain.  The information and recommendations are listed on the patient's comprehensive care plan/treatment summary and were reviewed in detail with the patient.    4. Health maintenance and wellness promotion: Aimee Mcclain was encouraged to consume 5-7  servings of fruits and vegetables per day. We reviewed the "Nutrition Rainbow" handout. She was also encouraged to engage in moderate to vigorous exercise for 30 minutes per day most days of the week. She was instructed to limit her alcohol consumption and continue to abstain from tobacco use.     5. Support services/counseling: It is not uncommon for this period of the patient's cancer care trajectory to be one of many emotions and stressors.  She has social support group but does not think active participation in-person support groups, such as [facility], would benefit her.   Aimee Mcclain was made aware of other resources via social work department she was offered support today through active listening and expressive supportive counseling.  She was given information regarding our available services and encouraged to contact me with any questions or for help enrolling in any of our support group/programs.    Dispo:   -Return to cancer center every 3 week Herceptin injection through 03/2022 -Follow-up with Dr. Burr Medico in 12 weeks -Next PT screening 11/24/2021 -Mammogram 12/16/2021 at [facility] -Follow up with surgery as scheduled -Resume gabapentin 100 mg at night -Flonase and other supportive care for sinus congestion -Refilled exemestane -She is welcome to return back to the Survivorship Clinic at any time; no additional follow-up needed at this time.  -Consider referral back to survivorship as a long-term survivor for continued surveillance  A total of (40) minutes was spent with this patient with greater than 50% of that time in counseling and care-coordination.   [name], NP Survivorship Program [facility] [phone]   Note: PRIMARY CARE PROVIDER [name], [place] [phone]

## 2021-11-13 NOTE — Patient Instructions (Signed)
[facility] MEDICAL ONCOLOGY  Discharge Instructions: Thank you for choosing [facility] to provide your oncology and hematology care.   If you have a lab appointment with the Cancer Center, please go directly to the Cancer Center and check in at the registration area.   Wear comfortable clothing and clothing appropriate for easy access to any Portacath or PICC line.   We strive to give you quality time with your provider. You may need to reschedule your appointment if you arrive late (15 or more minutes).  Arriving late affects you and other patients whose appointments are after yours.  Also, if you miss three or more appointments without notifying the office, you may be dismissed from the clinic at the provider's discretion.      For prescription refill requests, have your pharmacy contact our office and allow 72 hours for refills to be completed.    Today you received the following chemotherapy and/or immunotherapy agents : Herceptin     To help prevent nausea and vomiting after your treatment, we encourage you to take your nausea medication as directed.  BELOW ARE SYMPTOMS THAT SHOULD BE REPORTED IMMEDIATELY: *FEVER GREATER THAN 100.4 F (38 C) OR HIGHER *CHILLS OR SWEATING *NAUSEA AND VOMITING THAT IS NOT CONTROLLED WITH YOUR NAUSEA MEDICATION *UNUSUAL SHORTNESS OF BREATH *UNUSUAL BRUISING OR BLEEDING *URINARY PROBLEMS (pain or burning when urinating, or frequent urination) *BOWEL PROBLEMS (unusual diarrhea, constipation, pain near the anus) TENDERNESS IN MOUTH AND THROAT WITH OR WITHOUT PRESENCE OF ULCERS (sore throat, sores in mouth, or a toothache) UNUSUAL RASH, SWELLING OR PAIN  UNUSUAL VAGINAL DISCHARGE OR ITCHING   Items with * indicate a potential emergency and should be followed up as soon as possible or go to the Emergency Department if any problems should occur.  Please show the CHEMOTHERAPY ALERT CARD or IMMUNOTHERAPY ALERT CARD at check-in to  the Emergency Department and triage nurse.  Should you have questions after your visit or need to cancel or reschedule your appointment, please contact [facility] MEDICAL ONCOLOG[facility]  Dept: [phone]  and follow the prompts.  Office hours are 8:00 a.m. to 4:30 p.m. Monday - Friday. Please note that voicemails left after 4:00 p.m. may not be returned until the following business day.  We are closed weekends and major holidays. You have access to a nurse at all times for urgent questions. Please call the main number to the clinic Dept: [phone] and follow the prompts.   For any non-urgent questions, you may also contact your provider using MyChart. We now offer e-Visits for anyone 18 and older to request care online for non-urgent symptoms. For details visit [email or web address].   Also download the MyChart app! Go to the app store, search "MyChart", open the app, select [facility], and log in with your MyChart username and password.  Due to Covid, a mask is required upon entering the hospital/clinic. If you do not have a mask, one will be given to you upon arrival. For doctor visits, patients may have 1 support person aged 18 or older with them. For treatment visits, patients cannot have anyone with them due to current Covid guidelines and our immunocompromised population.   

## 2021-11-13 NOTE — Progress Notes (Signed)
  Echocardiogram 2D Echocardiogram has been performed.  Aimee Mcclain 11/13/2021, 9:01 AM

## 2021-11-13 NOTE — Progress Notes (Signed)
Pt had an echo this morning.  It has not resulted.  Last echo 8/24 EF 60-65%.  Per Dr. Burr Medico ok to treat based on last echo.

## 2021-11-24 ENCOUNTER — Ambulatory Visit: Payer: Medicare Other | Attending: General Surgery

## 2021-11-24 ENCOUNTER — Other Ambulatory Visit: Payer: Self-pay

## 2021-11-24 VITALS — Wt 182.0 lb

## 2021-11-24 DIAGNOSIS — Z483 Aftercare following surgery for neoplasm: Secondary | ICD-10-CM | POA: Insufficient documentation

## 2021-11-24 NOTE — Therapy (Signed)
[facility] @ [place], [place], [address] Phone: [phone]   Fax:  [phone]  Physical Therapy Treatment  Patient Details  Name: Aimee Mcclain MRN: [id number] Date of Birth: [birth year] Referring Provider (PT): Dr. Stark Klein   Encounter Date: 11/24/2021   PT End of Session - 11/24/21 0932     Visit Number 3   # unchanged due to screen only   PT Start Time 0930    PT Stop Time 0939    PT Time Calculation (min) 9 min    Activity Tolerance Patient tolerated treatment well    Behavior During Therapy [facility] for tasks assessed/performed             Past Medical History:  Diagnosis Date   Arthritis    Breast cancer ([place])    Cataract    Colon polyps    Endometriosis    Family history of colon cancer    Family history of colonic polyps    Fuchs' corneal dystrophy    GERD (gastroesophageal reflux disease)    H/O bilateral breast implants    History of colon polyps    HPV in female    HSV (herpes simplex virus) infection    Migraine    no longer having migraines   S/P TAH (total abdominal hysterectomy) 1991    Past Surgical History:  Procedure Laterality Date   ABDOMINAL HYSTERECTOMY     BREAST BIOPSY     Benign.   BREAST ENHANCEMENT SURGERY     BREAST LUMPECTOMY Bilateral 1993 and 1995   BREAST LUMPECTOMY WITH RADIOACTIVE SEED AND SENTINEL LYMPH NODE BIOPSY Left 02/13/2021   Procedure: LEFT BREAST LUMPECTOMY WITH RADIOACTIVE SEED AND SENTINEL LYMPH NODE BIOPSY WITH SEED TARGETED NODE;  Surgeon: Stark Klein, MD;  Location: [place];  Service: General;  Laterality: Left;   CATARACT EXTRACTION, BILATERAL     CESAREAN SECTION     PARTIAL HYSTERECTOMY     PORT-A-CATH REMOVAL Right 11/04/2021   Procedure: REMOVAL PORT-A-CATH;  Surgeon: Stark Klein, MD;  Location: [place];  Service: General;  Laterality: Right;   PORTACATH PLACEMENT Right 02/13/2021   Procedure: INSERTION  PORT-A-CATH;  Surgeon: Stark Klein, MD;  Location: [place];  Service: General;  Laterality: Right;   TONSILLECTOMY     WISDOM TOOTH EXTRACTION      Vitals:   11/24/21 0930  Weight: 182 lb (82.6 kg)     Subjective Assessment - 11/24/21 0927     Subjective Pt returns for her 3 month L-dex screen. 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 Ki67 is 30%. Patient underwent a left lumpectomy and sentinel node biopsy (4 negative nodes) on 02/13/2021. Radiation complete 07/28/21                                          PT Short Term Goals - 09/10/21 0940       PT SHORT TERM GOAL #1   Title Pt will be ind with self MLD for the left breast    Time 1    Period Days    Status Achieved  PT Long Term Goals - 03/10/21 1444       PT LONG TERM GOAL #1   Title Patient will demonstrate she has regained full shoulder ROM and function post operatively compared to baselines.    Time 6    Period Months    Status Achieved                   Plan - 11/24/21 0939     Clinical Impression Statement Pt returns for her 3 month L-Dex screen. Her change from baseline of -1.8 is WNLs so no further treatmtent is required at this time. Did spend time answering pts questions about her reported increased Lt breast lymhpedema. When questioned about freq of self MLD she repotrs doing this for a few mins in the shower. So reviewed importance of spending about 20-30 mins doing this 1-2x/day and resuming wear of her compression bra 24/7 with compression foam we have issued her in the past. Pt is to try this regimen for about 2 weeks and if still struggling with lymphedema knows she can call this clinic for review.    PT Next Visit Plan Cont every 3 month L-Dex screens for up  to 2 years from her SLNB (~02/14/23)             Patient will benefit from skilled therapeutic intervention in order to improve the following deficits and impairments:     Visit Diagnosis: Aftercare following surgery for neoplasm     Problem List Patient Active Problem List   Diagnosis Date Noted   HTN (hypertension) 06/22/2021   Herpes simplex 06/19/2021   Osteopenia 06/19/2021   Medicare welcome exam 05/21/2021   Port-A-Cath in place 03/18/2021   Malignant neoplasm of upper-inner quadrant of left breast in female, estrogen receptor positive ([place]) 01/16/2021   Chronic migraine 08/21/2020   Screening mammogram, encounter for 06/30/2020   Paresthesia 06/09/2020   Neuropathy 02/14/2020   Atypical chest pain 02/14/2020   Healthcare maintenance 02/14/2020   Migraine    Advance care planning 01/16/2019   Family history of colon cancer     Otelia Limes, PTA 11/24/2021, 9:43 AM  [facility] @ [place], [place], [address] Phone: [phone]   Fax:  [phone]  Name: Aimee Mcclain MRN: [id number] Date of Birth: 03/01/1956

## 2021-12-04 ENCOUNTER — Inpatient Hospital Stay: Payer: Medicare Other

## 2021-12-04 ENCOUNTER — Other Ambulatory Visit: Payer: Self-pay

## 2021-12-04 VITALS — BP 142/92 | HR 75 | Temp 99.0°F | Resp 18 | Ht 67.0 in | Wt 176.5 lb

## 2021-12-04 DIAGNOSIS — Z170 Estrogen receptor positive status [ER+]: Secondary | ICD-10-CM

## 2021-12-04 DIAGNOSIS — Z5112 Encounter for antineoplastic immunotherapy: Secondary | ICD-10-CM | POA: Diagnosis not present

## 2021-12-04 LAB — CMP (CANCER CENTER ONLY)
ALT: 14 U/L (ref 0–44)
AST: 18 U/L (ref 15–41)
Albumin: 4.5 g/dL (ref 3.5–5.0)
Alkaline Phosphatase: 103 U/L (ref 38–126)
Anion gap: 4 — ABNORMAL LOW (ref 5–15)
BUN: 20 mg/dL (ref 8–23)
CO2: 29 mmol/L (ref 22–32)
Calcium: 9.7 mg/dL (ref 8.9–10.3)
Chloride: 108 mmol/L (ref 98–111)
Creatinine: 0.87 mg/dL (ref 0.44–1.00)
GFR, Estimated: >60 mL/min (ref >60)
Glucose, Bld: 93 mg/dL (ref 70–99)
Potassium: 4.4 mmol/L (ref 3.5–5.1)
Sodium: 141 mmol/L (ref 135–145)
Total Bilirubin: 0.5 mg/dL (ref 0.3–1.2)
Total Protein: 7 g/dL (ref 6.5–8.1)

## 2021-12-04 LAB — CBC WITH DIFFERENTIAL (CANCER CENTER ONLY)
Abs Immature Granulocytes: 0 10*3/uL (ref 0.00–0.07)
Basophils Absolute: 0 10*3/uL (ref 0.0–0.1)
Basophils Relative: 1 %
Eosinophils Absolute: 0.1 10*3/uL (ref 0.0–0.5)
Eosinophils Relative: 2 %
HCT: 41.3 % (ref 36.0–46.0)
Hemoglobin: 14 g/dL (ref 12.0–15.0)
Immature Granulocytes: 0 %
Lymphocytes Relative: 34 %
Lymphs Abs: 1.3 10*3/uL (ref 0.7–4.0)
MCH: 30.1 pg (ref 26.0–34.0)
MCHC: 33.9 g/dL (ref 30.0–36.0)
MCV: 88.8 fL (ref 80.0–100.0)
Monocytes Absolute: 0.4 10*3/uL (ref 0.1–1.0)
Monocytes Relative: 10 %
Neutro Abs: 2 10*3/uL (ref 1.7–7.7)
Neutrophils Relative %: 53 %
Platelet Count: 211 10*3/uL (ref 150–400)
RBC: 4.65 MIL/uL (ref 3.87–5.11)
RDW: 14.2 % (ref 11.5–15.5)
WBC Count: 3.7 10*3/uL — ABNORMAL LOW (ref 4.0–10.5)
nRBC: 0 % (ref 0.0–0.2)

## 2021-12-04 MED ORDER — ACETAMINOPHEN 325 MG PO TABS
650.0000 mg | ORAL_TABLET | Freq: Once | ORAL | Status: AC
  Administered 2021-12-04: 11:00:00 650 mg via ORAL
  Filled 2021-12-04: qty 2

## 2021-12-04 MED ORDER — TRASTUZUMAB-HYALURONIDASE-OYSK 600-10000 MG-UNT/5ML ~~LOC~~ SOLN
600.0000 mg | Freq: Once | SUBCUTANEOUS | Status: AC
  Administered 2021-12-04: 12:00:00 600 mg via SUBCUTANEOUS
  Filled 2021-12-04: qty 5

## 2021-12-04 MED ORDER — DIPHENHYDRAMINE HCL 25 MG PO CAPS
50.0000 mg | ORAL_CAPSULE | Freq: Once | ORAL | Status: AC
  Administered 2021-12-04: 11:00:00 50 mg via ORAL
  Filled 2021-12-04: qty 2

## 2021-12-04 NOTE — Patient Instructions (Signed)
[facility]  Discharge Instructions: Thank you for choosing [facility] to provide your oncology and hematology care.   If you have a lab appointment with the Cancer Center, please go directly to the Cancer Center and check in at the registration area.   Wear comfortable clothing and clothing appropriate for easy access to any Portacath or PICC line.   We strive to give you quality time with your provider. You may need to reschedule your appointment if you arrive late (15 or more minutes).  Arriving late affects you and other patients whose appointments are after yours.  Also, if you miss three or more appointments without notifying the office, you may be dismissed from the clinic at the provider's discretion.      For prescription refill requests, have your pharmacy contact our office and allow 72 hours for refills to be completed.    Today you received the following chemotherapy and/or immunotherapy agents : Herceptin     To help prevent nausea and vomiting after your treatment, we encourage you to take your nausea medication as directed.  BELOW ARE SYMPTOMS THAT SHOULD BE REPORTED IMMEDIATELY: *FEVER GREATER THAN 100.4 F (38 C) OR HIGHER *CHILLS OR SWEATING *NAUSEA AND VOMITING THAT IS NOT CONTROLLED WITH YOUR NAUSEA MEDICATION *UNUSUAL SHORTNESS OF BREATH *UNUSUAL BRUISING OR BLEEDING *URINARY PROBLEMS (pain or burning when urinating, or frequent urination) *BOWEL PROBLEMS (unusual diarrhea, constipation, pain near the anus) TENDERNESS IN MOUTH AND THROAT WITH OR WITHOUT PRESENCE OF ULCERS (sore throat, sores in mouth, or a toothache) UNUSUAL RASH, SWELLING OR PAIN  UNUSUAL VAGINAL DISCHARGE OR ITCHING   Items with * indicate a potential emergency and should be followed up as soon as possible or go to the Emergency Department if any problems should occur.  Please show the CHEMOTHERAPY ALERT CARD or IMMUNOTHERAPY ALERT CARD at check-in to  the Emergency Department and triage nurse.  Should you have questions after your visit or need to cancel or reschedule your appointment, please contact [facility]  Dept: [phone]  and follow the prompts.  Office hours are 8:00 a.m. to 4:30 p.m. Monday - Friday. Please note that voicemails left after 4:00 p.m. may not be returned until the following business day.  We are closed weekends and major holidays. You have access to a nurse at all times for urgent questions. Please call the main number to the clinic Dept: [phone] and follow the prompts.   For any non-urgent questions, you may also contact your provider using MyChart. We now offer e-Visits for anyone 18 and older to request care online for non-urgent symptoms. For details visit [email or web address].   Also download the MyChart app! Go to the app store, search "MyChart", open the app, select [place], and log in with your MyChart username and password.  Due to Covid, a mask is required upon entering the hospital/clinic. If you do not have a mask, one will be given to you upon arrival. For doctor visits, patients may have 1 support person aged 18 or older with them. For treatment visits, patients cannot have anyone with them due to current Covid guidelines and our immunocompromised population.   

## 2021-12-11 ENCOUNTER — Encounter: Payer: Self-pay | Admitting: Nurse Practitioner

## 2021-12-16 ENCOUNTER — Telehealth: Payer: Self-pay | Admitting: *Deleted

## 2021-12-16 NOTE — Telephone Encounter (Signed)
Will evaluate at visit. Thank you

## 2021-12-16 NOTE — Telephone Encounter (Signed)
Spoke to patient by telephone and was advised that her husband tested positive for covid this morning. Patient stated that her husband has already been scheduled for a virtual visit today. Patient stated that her husband started with symptoms yesterday. Patient stated that she has not tested herself. Patient stated that her muscles are sore but feels that is because she has been sleeping on the couch. Patient stated that she is concerned about herself because her white blood count is low because of her treatment for breast cancer. Patient was advised to go ahead and do a covid test and I will call her back shortly.

## 2021-12-16 NOTE — Telephone Encounter (Signed)
Spoke to patient by telephone and was advised that her covid test was negative. Patient denies a fever, congestion, SOB or any other symptoms other than the muscle soreness. Patient is concerned because of her health. Patient scheduled for a virtual visit tomorrow 12/17/21 with Romilda Garret NP. Patient was given ER precautions and she verbalized understanding.

## 2021-12-16 NOTE — Telephone Encounter (Signed)
PLEASE NOTE: All timestamps contained within this report are represented as [place] Standard Time. CONFIDENTIALTY NOTICE: This fax transmission is intended only for the addressee. It contains information that is legally privileged, confidential or otherwise protected from use or disclosure. If you are not the intended recipient, you are strictly prohibited from reviewing, disclosing, copying using or disseminating any of this information or taking any action in reliance on or regarding this information. If you have received this fax in error, please notify us immediately by telephone so that we can arrange for its return to [place]. Phone: [phone], Toll-Free: [phone], Fax: [phone] Page: 1 of 1 Call Id: [id number] [facility] Night - Client Nonclinical Telephone Record  [facility] Client [place] Night - Client Client Site [place] Provider Renford Dills - MD Contact Type Call Who Is Calling Patient / Member / Family / Caregiver Caller Name Nikole Swartzentruber Caller Phone Number [phone] Patient Name Aimee Mcclain Patient DOB 09-27-1956 Call Type Message Only Information Provided Reason for Call Request for General Office Information Initial Comment Caller states her husband tested positive for covid. Has a fever and cold like symptoms. Disp. Time Disposition Final User 12/16/2021 7:54:40 AM General Information Provided Yes Silver Huguenin Call Closed By: Silver Huguenin Transaction Date/Time: 12/16/2021 7:51:38 AM (ET)

## 2021-12-17 ENCOUNTER — Telehealth: Payer: Self-pay

## 2021-12-17 ENCOUNTER — Telehealth (INDEPENDENT_AMBULATORY_CARE_PROVIDER_SITE_OTHER): Payer: Medicare Other | Admitting: Nurse Practitioner

## 2021-12-17 ENCOUNTER — Other Ambulatory Visit: Payer: Self-pay

## 2021-12-17 ENCOUNTER — Encounter: Payer: Self-pay | Admitting: Nurse Practitioner

## 2021-12-17 VITALS — BP 148/85 | HR 87 | Temp 97.5°F

## 2021-12-17 DIAGNOSIS — D72819 Decreased white blood cell count, unspecified: Secondary | ICD-10-CM | POA: Diagnosis not present

## 2021-12-17 DIAGNOSIS — Z20822 Contact with and (suspected) exposure to covid-19: Secondary | ICD-10-CM | POA: Diagnosis not present

## 2021-12-17 NOTE — Telephone Encounter (Signed)
Left detailed message for patient-ok per DPR on file.  Patient had virtual visit with Aimee Mcclain this morning and PCR Covid test was ordered, I misunderstood and performed a Rapid Covid test on the patient instead. I called to let patient know and apologized for my mistake.  I am waiting to hear back from patient to see if she is able to come by to get this done today or tomorrow if she does not want to proceed. No extra charge is been done for the other test.  Aimee Mcclain is aware of this. Sending to him and [place] as FYI and for documentation.

## 2021-12-17 NOTE — Progress Notes (Signed)
Patient ID: Aimee Mcclain, female    DOB: 1956/04/12, 66 y.o.   MRN: [id number]  Virtual visit completed through [facility], a video enabled telemedicine application. Due to national recommendations of social distancing due to COVID-19, a virtual visit is felt to be most appropriate for this patient at this time. Reviewed limitations, risks, security and privacy concerns of performing a virtual visit and the availability of in person appointments. I also reviewed that there may be a patient responsible charge related to this service. The patient agreed to proceed.   Patient location: home Provider location: [place] at [facility], office Persons participating in this virtual visit: patient, provider   If any vitals were documented, they were collected by patient at home unless specified below.    BP (!) 148/85 Comment: per patient this morning   Pulse 87    Temp (!) 97.5 F (36.4 C)    SpO2 97%    CC: Covid 19 exposure Subjective:   HPI: Aimee Mcclain is a 66 y.o. female presenting on 12/17/2021 for Covid Exposure (Husband tested positive for covid on 12/16/20, patient tested negative on 1/3 and 1/4. Patient has scratchy throat, slight headache off and on. )  Husband symptoms started on 12/30 or 12/31. Then on 12/14/2020 started feeling achy and headachey Husband tested positive yesterday was positive and started paxlovid   Patient states she has a little bit of scracthy throat and headache that started today. This is possible with her current treatment she states. Has test negative today and yesterday Vaccinated pfizer x2 and 2 booster Currently managed by Dr. Burr Medico, oncology   Relevant past medical, surgical, family and social history reviewed and updated as indicated. Interim medical history since our last visit reviewed. Allergies and medications reviewed and updated. Outpatient Medications Prior to Visit  Medication Sig Dispense Refill   acetaminophen (TYLENOL) 500 MG  tablet Take 1,000 mg by mouth every 6 (six) hours as needed for moderate pain.     Calcium Carbonate-Vitamin D (CALCIUM 600+D PO) Take 1 tablet by mouth daily.     cholecalciferol (VITAMIN D3) 25 MCG (1000 UNIT) tablet Take 1,000 Units by mouth daily.     exemestane (AROMASIN) 25 MG tablet Take 1 tablet (25 mg total) by mouth daily after breakfast. 90 tablet 3   Multiple Vitamin (MULTIVITAMIN) tablet Take 1 tablet by mouth daily.     omeprazole (PRILOSEC) 20 MG capsule Take 20 mg by mouth daily.     valACYclovir (VALTREX) 500 MG tablet Take 1 tablet (500 mg total) by mouth 2 (two) times daily as needed.     Vitamin B Complex-C CAPS Take 1 capsule by mouth.     lactulose (CHRONULAC) 10 GM/15ML solution Take 30 mLs (20 g total) by mouth every 4 (four) hours as needed for mild constipation. (Patient not taking: Reported on 11/13/2021) 236 mL 0   traMADol (ULTRAM) 50 MG tablet Take 1 tablet (50 mg total) by mouth every 6 (six) hours as needed. (Patient not taking: Reported on 11/13/2021) 10 tablet 0   doxycycline (VIBRA-TABS) 100 MG tablet Take 1 tablet (100 mg total) by mouth 2 (two) times daily. (Patient not taking: Reported on 10/29/2021) 20 tablet 0   gabapentin (NEURONTIN) 100 MG capsule Take 1 capsule (100 mg total) by mouth 3 (three) times daily. (Patient not taking: Reported on 10/20/2021) 60 capsule 0   lidocaine-prilocaine (EMLA) cream Apply 1 application topically as needed. (Patient not taking: Reported on 10/29/2021) 30 g 2  lisinopril (ZESTRIL) 5 MG tablet Take 1 tablet (5 mg total) by mouth daily. (Patient not taking: Reported on 10/20/2021) 90 tablet 3   ondansetron (ZOFRAN) 8 MG tablet Take 1 tablet (8 mg total) by mouth every 8 (eight) hours as needed for nausea or vomiting. (Patient not taking: No sig reported) 30 tablet 2   prochlorperazine (COMPAZINE) 10 MG tablet Take 1 tablet (10 mg total) by mouth every 6 (six) hours as needed. (Patient not taking: No sig reported) 30 tablet 2    Pseudoeph-Doxylamine-DM-APAP (DAYQUIL/NYQUIL COLD/FLU RELIEF PO) Take by mouth as needed. (Patient not taking: Reported on 11/13/2021)     No facility-administered medications prior to visit.     Per HPI unless specifically indicated in ROS section below Review of Systems  Constitutional:  Negative for chills, fatigue and fever.  HENT:  Positive for sore throat (scratchy throat). Negative for congestion, ear discharge, ear pain, sinus pressure and sinus pain.   Respiratory:  Positive for cough and shortness of breath.   Cardiovascular:  Positive for chest pain.  Gastrointestinal:  Negative for diarrhea, nausea and vomiting.  Musculoskeletal:  Negative for arthralgias and myalgias.  Neurological:  Positive for headaches. Negative for dizziness and light-headedness.  Objective:  BP (!) 148/85 Comment: per patient this morning   Pulse 87    Temp (!) 97.5 F (36.4 C)    SpO2 97%   Wt Readings from Last 3 Encounters:  12/04/21 176 lb 8 oz (80.1 kg)  11/24/21 182 lb (82.6 kg)  11/13/21 180 lb 9.6 oz (81.9 kg)       Physical exam: Gen: alert, NAD, not ill appearing Pulm: speaks in complete sentences without increased work of breathing Psych: normal mood, normal thought content      Results for orders placed or performed in visit on 12/04/21  CMP ([facility] only)  Result Value Ref Range   Sodium 141 135 - 145 mmol/L   Potassium 4.4 3.5 - 5.1 mmol/L   Chloride 108 98 - 111 mmol/L   CO2 29 22 - 32 mmol/L   Glucose, Bld 93 70 - 99 mg/dL   BUN 20 8 - 23 mg/dL   Creatinine 0.87 0.44 - 1.00 mg/dL   Calcium 9.7 8.9 - 10.3 mg/dL   Total Protein 7.0 6.5 - 8.1 g/dL   Albumin 4.5 3.5 - 5.0 g/dL   AST 18 15 - 41 U/L   ALT 14 0 - 44 U/L   Alkaline Phosphatase 103 38 - 126 U/L   Total Bilirubin 0.5 0.3 - 1.2 mg/dL   GFR, Estimated >60 >60 mL/min   Anion gap 4 (L) 5 - 15  CBC with Differential (Cancer Center Only)  Result Value Ref Range   WBC Count 3.7 (L) 4.0 - 10.5 K/uL   RBC 4.65  3.87 - 5.11 MIL/uL   Hemoglobin 14.0 12.0 - 15.0 g/dL   HCT 41.3 36.0 - 46.0 %   MCV 88.8 80.0 - 100.0 fL   MCH 30.1 26.0 - 34.0 pg   MCHC 33.9 30.0 - 36.0 g/dL   RDW 14.2 11.5 - 15.5 %   Platelet Count 211 150 - 400 K/uL   nRBC 0.0 0.0 - 0.2 %   Neutrophils Relative % 53 %   Neutro Abs 2.0 1.7 - 7.7 K/uL   Lymphocytes Relative 34 %   Lymphs Abs 1.3 0.7 - 4.0 K/uL   Monocytes Relative 10 %   Monocytes Absolute 0.4 0.1 - 1.0 K/uL   Eosinophils  Relative 2 %   Eosinophils Absolute 0.1 0.0 - 0.5 K/uL   Basophils Relative 1 %   Basophils Absolute 0.0 0.0 - 0.1 K/uL   Immature Granulocytes 0 %   Abs Immature Granulocytes 0.00 0.00 - 0.07 K/uL   Assessment & Plan:   Problem List Items Addressed This Visit       Other   Leukopenia    Patient is concerned because husband is positive for COVID-19.  States that her white blood cells are low.  Upon looking at labs [facility] blood cell count was 3.7 and normal is 4.0.  Looking at differential all within normal limits.  Patient's neutrophils absolute neutrophils are within normal limits.  Discussed this with patient to attempt to get peace of mind.      Exposure to COVID-19 virus - Primary    Patient's husband currently has COVID-19 and started antiviral treatment today.  Patient has taken 2 at home test that were negative.  Did discuss with patient for certainty with her medical conditions we will test her with a PCR test.  Patient will swing by office to get PCR swabbed.  Pending lab results.  Did discuss appropriate quarantine with in-house along with wearing masks and keeping, surfaces clean and washing hands with patient.      Relevant Orders   Novel Coronavirus, NAA ([facility])     No orders of the defined types were placed in this encounter.  Orders Placed This Encounter  Procedures   Novel Coronavirus, NAA ([facility])    Standing Status:   Future    Standing Expiration Date:   12/24/2021    Order Specific Question:   Previously tested  for COVID-19    Answer:   Yes    Order Specific Question:   Resident in a congregate (group) care setting    Answer:   No    Order Specific Question:   Is the patient student?    Answer:   No    Order Specific Question:   Employed in healthcare setting    Answer:   No    Order Specific Question:   Pregnant    Answer:   No    Order Specific Question:   Has patient completed COVID vaccination(s) (2 doses of Pfizer/Moderna 1 dose of [employer])    Answer:   Yes    Order Specific Question:   Has patient completed COVID Booster / 3rd dose    Answer:   Yes    I discussed the assessment and treatment plan with the patient. The patient was provided an opportunity to ask questions and all were answered. The patient agreed with the plan and demonstrated an understanding of the instructions. The patient was advised to call back or seek an in-person evaluation if the symptoms worsen or if the condition fails to improve as anticipated.  Follow up plan: No follow-ups on file.  Romilda Garret, NP

## 2021-12-17 NOTE — Telephone Encounter (Signed)
Noted. Thanks.

## 2021-12-17 NOTE — Assessment & Plan Note (Signed)
Patient's husband currently has GGYIR-48 and started antiviral treatment today.  Patient has taken 2 at home test that were negative.  Did discuss with patient for certainty with her medical conditions we will test her with a PCR test.  Patient will swing by office to get PCR swabbed.  Pending lab results.  Did discuss appropriate quarantine with in-house along with wearing masks and keeping, surfaces clean and washing hands with patient.

## 2021-12-17 NOTE — Assessment & Plan Note (Signed)
Patient is concerned because husband is positive for COVID-19.  States that her white blood cells are low.  Upon looking at labs [facility] blood cell count was 3.7 and normal is 4.0.  Looking at differential all within normal limits.  Patient's neutrophils absolute neutrophils are within normal limits.  Discussed this with patient to attempt to get peace of mind.

## 2021-12-17 NOTE — Telephone Encounter (Signed)
Spoke with patient. Patient is coming by again for a swab, I apologized again. Patient did not seem to be upset

## 2021-12-17 NOTE — Telephone Encounter (Signed)
Please let me know what you hear.  Thanks.

## 2021-12-18 ENCOUNTER — Other Ambulatory Visit: Payer: Self-pay | Admitting: Nurse Practitioner

## 2021-12-18 DIAGNOSIS — U071 COVID-19: Secondary | ICD-10-CM

## 2021-12-18 LAB — SARS-COV-2, NAA 2 DAY TAT

## 2021-12-18 LAB — NOVEL CORONAVIRUS, NAA: SARS-CoV-2, NAA: DETECTED — AB

## 2021-12-18 MED ORDER — NIRMATRELVIR/RITONAVIR (PAXLOVID)TABLET: 3.0000 | ORAL_TABLET | Freq: Two times a day (BID) | ORAL | 0 refills | Status: AC

## 2021-12-18 NOTE — Telephone Encounter (Signed)
Pt took an at home covid test and she is covid positive. She would like to know what she should do. Please give pt a cb.

## 2021-12-18 NOTE — Telephone Encounter (Signed)
Called and spoke to patient got her started on paxlovid

## 2021-12-18 NOTE — Progress Notes (Signed)
Discussed EUA only and drug to drug interactions. She is no longer taking tramadol. Did discuss S&S to be seen in ED and UC. Did discuss CDC recommendations in regards to quarantine

## 2021-12-23 ENCOUNTER — Other Ambulatory Visit: Payer: Self-pay | Admitting: Hematology

## 2021-12-24 ENCOUNTER — Encounter: Payer: Self-pay | Admitting: Family Medicine

## 2021-12-24 NOTE — Telephone Encounter (Signed)
Noted  

## 2021-12-25 ENCOUNTER — Inpatient Hospital Stay: Payer: Medicare Other

## 2022-01-08 ENCOUNTER — Other Ambulatory Visit: Payer: Self-pay

## 2022-01-08 ENCOUNTER — Inpatient Hospital Stay: Payer: Medicare Other | Attending: Hematology

## 2022-01-08 VITALS — BP 141/84 | HR 69 | Temp 98.6°F | Resp 17 | Wt 179.8 lb

## 2022-01-08 DIAGNOSIS — C50212 Malignant neoplasm of upper-inner quadrant of left female breast: Secondary | ICD-10-CM | POA: Diagnosis present

## 2022-01-08 DIAGNOSIS — Z79899 Other long term (current) drug therapy: Secondary | ICD-10-CM | POA: Diagnosis not present

## 2022-01-08 DIAGNOSIS — Z170 Estrogen receptor positive status [ER+]: Secondary | ICD-10-CM | POA: Insufficient documentation

## 2022-01-08 DIAGNOSIS — Z9221 Personal history of antineoplastic chemotherapy: Secondary | ICD-10-CM | POA: Insufficient documentation

## 2022-01-08 DIAGNOSIS — Z923 Personal history of irradiation: Secondary | ICD-10-CM | POA: Diagnosis not present

## 2022-01-08 DIAGNOSIS — Z5112 Encounter for antineoplastic immunotherapy: Secondary | ICD-10-CM | POA: Insufficient documentation

## 2022-01-08 MED ORDER — TRASTUZUMAB-HYALURONIDASE-OYSK 600-10000 MG-UNT/5ML ~~LOC~~ SOLN
600.0000 mg | Freq: Once | SUBCUTANEOUS | Status: AC
  Administered 2022-01-08: 600 mg via SUBCUTANEOUS
  Filled 2022-01-08: qty 5

## 2022-01-08 MED ORDER — ACETAMINOPHEN 325 MG PO TABS
650.0000 mg | ORAL_TABLET | Freq: Once | ORAL | Status: AC
  Administered 2022-01-08: 650 mg via ORAL
  Filled 2022-01-08: qty 2

## 2022-01-08 MED ORDER — DIPHENHYDRAMINE HCL 25 MG PO CAPS
25.0000 mg | ORAL_CAPSULE | Freq: Once | ORAL | Status: AC
  Administered 2022-01-08: 25 mg via ORAL
  Filled 2022-01-08: qty 1

## 2022-01-08 NOTE — Patient Instructions (Signed)
[facility]  Discharge Instructions: Thank you for choosing [facility] to provide your oncology and hematology care.   If you have a lab appointment with the Cancer Center, please go directly to the Cancer Center and check in at the registration area.   Wear comfortable clothing and clothing appropriate for easy access to any Portacath or PICC line.   We strive to give you quality time with your provider. You may need to reschedule your appointment if you arrive late (15 or more minutes).  Arriving late affects you and other patients whose appointments are after yours.  Also, if you miss three or more appointments without notifying the office, you may be dismissed from the clinic at the provider's discretion.      For prescription refill requests, have your pharmacy contact our office and allow 72 hours for refills to be completed.    Today you received the following chemotherapy and/or immunotherapy agents : Herceptin     To help prevent nausea and vomiting after your treatment, we encourage you to take your nausea medication as directed.  BELOW ARE SYMPTOMS THAT SHOULD BE REPORTED IMMEDIATELY: *FEVER GREATER THAN 100.4 F (38 C) OR HIGHER *CHILLS OR SWEATING *NAUSEA AND VOMITING THAT IS NOT CONTROLLED WITH YOUR NAUSEA MEDICATION *UNUSUAL SHORTNESS OF BREATH *UNUSUAL BRUISING OR BLEEDING *URINARY PROBLEMS (pain or burning when urinating, or frequent urination) *BOWEL PROBLEMS (unusual diarrhea, constipation, pain near the anus) TENDERNESS IN MOUTH AND THROAT WITH OR WITHOUT PRESENCE OF ULCERS (sore throat, sores in mouth, or a toothache) UNUSUAL RASH, SWELLING OR PAIN  UNUSUAL VAGINAL DISCHARGE OR ITCHING   Items with * indicate a potential emergency and should be followed up as soon as possible or go to the Emergency Department if any problems should occur.  Please show the CHEMOTHERAPY ALERT CARD or IMMUNOTHERAPY ALERT CARD at check-in to  the Emergency Department and triage nurse.  Should you have questions after your visit or need to cancel or reschedule your appointment, please contact [facility]  Dept: [phone]  and follow the prompts.  Office hours are 8:00 a.m. to 4:30 p.m. Monday - Friday. Please note that voicemails left after 4:00 p.m. may not be returned until the following business day.  We are closed weekends and major holidays. You have access to a nurse at all times for urgent questions. Please call the main number to the clinic Dept: [phone] and follow the prompts.   For any non-urgent questions, you may also contact your provider using MyChart. We now offer e-Visits for anyone 18 and older to request care online for non-urgent symptoms. For details visit [email or web address].   Also download the MyChart app! Go to the app store, search "MyChart", open the app, select [place], and log in with your MyChart username and password.  Due to Covid, a mask is required upon entering the hospital/clinic. If you do not have a mask, one will be given to you upon arrival. For doctor visits, patients may have 1 support person aged 18 or older with them. For treatment visits, patients cannot have anyone with them due to current Covid guidelines and our immunocompromised population.   

## 2022-01-09 ENCOUNTER — Other Ambulatory Visit: Payer: Self-pay

## 2022-01-15 ENCOUNTER — Ambulatory Visit: Payer: Medicare Other

## 2022-01-16 ENCOUNTER — Other Ambulatory Visit: Payer: Self-pay

## 2022-01-16 ENCOUNTER — Ambulatory Visit (INDEPENDENT_AMBULATORY_CARE_PROVIDER_SITE_OTHER): Payer: Medicare Other | Admitting: Nurse Practitioner

## 2022-01-16 ENCOUNTER — Encounter: Payer: Self-pay | Admitting: Nurse Practitioner

## 2022-01-16 VITALS — BP 142/80 | HR 87 | Temp 98.3°F | Resp 14 | Ht 67.0 in | Wt 183.0 lb

## 2022-01-16 DIAGNOSIS — M79674 Pain in right toe(s): Secondary | ICD-10-CM

## 2022-01-16 DIAGNOSIS — L609 Nail disorder, unspecified: Secondary | ICD-10-CM

## 2022-01-16 DIAGNOSIS — L03031 Cellulitis of right toe: Secondary | ICD-10-CM | POA: Diagnosis not present

## 2022-01-16 MED ORDER — SULFAMETHOXAZOLE-TRIMETHOPRIM 800-160 MG PO TABS: 1.0000 | ORAL_TABLET | Freq: Two times a day (BID) | ORAL | 0 refills | Status: AC

## 2022-01-16 NOTE — Assessment & Plan Note (Signed)
Bilateral loose great toe toenails.  Patient has been offered to go to podiatry in the past and deferred.  Did offer patient podiatry if she would like to go once we have past acute problem.  She can let me know via MyChart if she decides she wants a podiatry referral

## 2022-01-16 NOTE — Assessment & Plan Note (Signed)
Patient's exam consistent with paronychia.  Patient has a lower immune system.  Immunotherapy regarding however breast cancer treatment.  Will elect to treat patient with antibiotics we will do Bactrim 1 tablet twice daily for 7 days.  Patient is on both treatment plan.  Follow-up if no improvement

## 2022-01-16 NOTE — Patient Instructions (Signed)
Nice to see you today Sent in antibiotics to pharmacy Will be in touch with uric acid result Follow up if no improvement

## 2022-01-16 NOTE — Assessment & Plan Note (Signed)
Describes a throbbing pain.  Possibility of gout although exam not consistent.  Did discuss checking uric acid the patient would like to have that done.  Pending uric acid result

## 2022-01-16 NOTE — Progress Notes (Signed)
Acute Office Visit  Subjective:    Patient ID: Aimee Mcclain, female    DOB: 05-02-1956, 66 y.o.   MRN: [id number]  Chief Complaint  Patient presents with   Toe/nail issue    Right big toe nail is loose, noticed suddenly today his right big toe red and painful/throbbing sensation    HPI Patient is in today for Right toe nail issues/Toe swelling   States that she has a problem with her toe nails getting loose and breaking off. Has lost the toe nails in the past and they grow back. Has not been seen by podiatry although this has been discussed in the past. States today that the right great toe got red and started throbbing. No history of gout. She has been using alcohol under the loose nail in order to try and keep water from underneath it. Currently undergoing treatment for breast cancer. Has finished her radiation and chemo currently doing immuno therapy.    Past Medical History:  Diagnosis Date   Arthritis    Breast cancer ([place])    Cataract    Colon polyps    Endometriosis    Family history of colon cancer    Family history of colonic polyps    Fuchs' corneal dystrophy    GERD (gastroesophageal reflux disease)    H/O bilateral breast implants    History of colon polyps    HPV in female    HSV (herpes simplex virus) infection    Migraine    no longer having migraines   S/P TAH (total abdominal hysterectomy) 1991    Past Surgical History:  Procedure Laterality Date   ABDOMINAL HYSTERECTOMY     BREAST BIOPSY     Benign.   BREAST ENHANCEMENT SURGERY     BREAST LUMPECTOMY Bilateral 1993 and 1995   BREAST LUMPECTOMY WITH RADIOACTIVE SEED AND SENTINEL LYMPH NODE BIOPSY Left 02/13/2021   Procedure: LEFT BREAST LUMPECTOMY WITH RADIOACTIVE SEED AND SENTINEL LYMPH NODE BIOPSY WITH SEED TARGETED NODE;  Surgeon: Stark Klein, MD;  Location: [facility];  Service: General;  Laterality: Left;   CATARACT EXTRACTION, BILATERAL     CESAREAN SECTION     PARTIAL  HYSTERECTOMY     PORT-A-CATH REMOVAL Right 11/04/2021   Procedure: REMOVAL PORT-A-CATH;  Surgeon: Stark Klein, MD;  Location: [facility];  Service: General;  Laterality: Right;   PORTACATH PLACEMENT Right 02/13/2021   Procedure: INSERTION PORT-A-CATH;  Surgeon: Stark Klein, MD;  Location: [facility];  Service: General;  Laterality: Right;   TONSILLECTOMY     WISDOM TOOTH EXTRACTION      Family History  Problem Relation Age of Onset   Dementia Maternal Grandmother    Heart disease Maternal Grandfather    Colon cancer Maternal Grandfather        dx over 67   Colon cancer Father        d. 58   Heart disease Mother    Colon cancer Mother        dx late 35s-70s   Rectal cancer Mother 70   Diabetes Brother    Colon polyps Brother    Diabetes Brother    Colon polyps Brother    Lymphoma Maternal Aunt    Heart disease Maternal Uncle    Crohn's disease Daughter    Esophageal cancer Neg Hx    Stomach cancer Neg Hx    Breast cancer Neg Hx     Social History   Socioeconomic History  Marital status: Married    Spouse name: Not on file   Number of children: 2   Years of education: some college   Highest education level: Not on file  Occupational History   Occupation: Accountant  Tobacco Use   Smoking status: Former    Packs/day: 0.50    Years: 10.00    Pack years: 5.00    Types: Cigarettes    Quit date: 12/14/1993    Years since quitting: 28.1   Smokeless tobacco: Never  Vaping Use   Vaping Use: Never used  Substance and Sexual Activity   Alcohol use: Yes    Comment: very rare   Drug use: No   Sexual activity: Yes    Comment: hyst  Other Topics Concern   Not on file  Social History Narrative   Lives at home with husband.   Married 1988.   Right-handed.   Social Determinants of Health   Financial Resource Strain: Low Risk    Difficulty of Paying Living Expenses: Not hard at all  Food Insecurity: No Food Insecurity   Worried About Charity fundraiser  in the Last Year: Never true   [place] in the Last Year: Never true  Transportation Needs: No Transportation Needs   Lack of Transportation (Medical): No   Lack of Transportation (Non-Medical): No  Physical Activity: Not on file  Stress: Not on file  Social Connections: Not on file  Intimate Partner Violence: Not At Risk   Fear of Current or Ex-Partner: No   Emotionally Abused: No   Physically Abused: No   Sexually Abused: No    Outpatient Medications Prior to Visit  Medication Sig Dispense Refill   acetaminophen (TYLENOL) 500 MG tablet Take 1,000 mg by mouth every 6 (six) hours as needed for moderate pain.     Calcium Carbonate-Vitamin D (CALCIUM 600+D PO) Take 1 tablet by mouth daily.     cholecalciferol (VITAMIN D3) 25 MCG (1000 UNIT) tablet Take 1,000 Units by mouth daily.     exemestane (AROMASIN) 25 MG tablet Take 1 tablet (25 mg total) by mouth daily after breakfast. 90 tablet 3   Multiple Vitamin (MULTIVITAMIN) tablet Take 1 tablet by mouth daily.     omeprazole (PRILOSEC) 20 MG capsule Take 20 mg by mouth daily.     valACYclovir (VALTREX) 500 MG tablet Take 1 tablet (500 mg total) by mouth 2 (two) times daily as needed.     Vitamin B Complex-C CAPS Take 1 capsule by mouth.     lactulose (CHRONULAC) 10 GM/15ML solution Take 30 mLs (20 g total) by mouth every 4 (four) hours as needed for mild constipation. (Patient not taking: Reported on 11/13/2021) 236 mL 0   traMADol (ULTRAM) 50 MG tablet Take 1 tablet (50 mg total) by mouth every 6 (six) hours as needed. (Patient not taking: Reported on 11/13/2021) 10 tablet 0   No facility-administered medications prior to visit.    Allergies  Allergen Reactions   Ibuprofen Swelling    She has tolerated aleve   Omnicef [Cefdinir] Itching   Proton Pump Inhibitors     Headache, but Can tolerate prilosec   Roxicodone [Oxycodone Hcl] Rash    Developed rash on face and chest   Vioxx [Rofecoxib] Swelling and Rash    Review of  Systems  Constitutional:  Negative for chills and fever.  Musculoskeletal:  Positive for arthralgias and joint swelling.  Skin:  Positive for color change. Negative for pallor, rash and  wound.      Objective:    Physical Exam Constitutional:      Appearance: Normal appearance.  Cardiovascular:     Rate and Rhythm: Normal rate and regular rhythm.     Pulses:          Dorsalis pedis pulses are 2+ on the right side and 2+ on the left side.     Heart sounds: Normal heart sounds.  Pulmonary:     Effort: Pulmonary effort is normal.     Breath sounds: Normal breath sounds.  Musculoskeletal:       Feet:  Feet:     Right foot:     Skin integrity: Skin integrity normal.     Left foot:     Skin integrity: Skin integrity normal.     Comments: Erythema, edema to the end of patients right great toe. No warmth noted. TTP on the proximal nail plate/skin. Nail discoloration  Skin:    General: Skin is warm.     Findings: Erythema present.  Neurological:     Mental Status: She is alert.    BP (!) 142/80    Pulse 87    Temp 98.3 F (36.8 C)    Resp 14    Ht 5\' 7"  (1.702 m)    Wt 183 lb (83 kg)    SpO2 98%    BMI 28.66 kg/m  Wt Readings from Last 3 Encounters:  01/16/22 183 lb (83 kg)  01/08/22 179 lb 12 oz (81.5 kg)  12/04/21 176 lb 8 oz (80.1 kg)    Health Maintenance Due  Topic Date Due   Pneumonia Vaccine 18+ Years old (1 - PCV) Never done   HIV Screening  Never done   TETANUS/TDAP  Never done   Zoster Vaccines- Shingrix (1 of 2) Never done   PAP SMEAR-Modifier  Never done   COVID-19 Vaccine (4 - Booster for Pfizer series) 01/01/2021    There are no preventive care reminders to display for this patient.   Lab Results  Component Value Date   TSH 1.76 02/12/2020   Lab Results  Component Value Date   WBC 3.7 (L) 12/04/2021   HGB 14.0 12/04/2021   HCT 41.3 12/04/2021   MCV 88.8 12/04/2021   PLT 211 12/04/2021   Lab Results  Component Value Date   NA 141 12/04/2021    K 4.4 12/04/2021   CO2 29 12/04/2021   GLUCOSE 93 12/04/2021   BUN 20 12/04/2021   CREATININE 0.87 12/04/2021   BILITOT 0.5 12/04/2021   ALKPHOS 103 12/04/2021   AST 18 12/04/2021   ALT 14 12/04/2021   PROT 7.0 12/04/2021   ALBUMIN 4.5 12/04/2021   CALCIUM 9.7 12/04/2021   ANIONGAP 4 (L) 12/04/2021   GFR 64.75 02/12/2020   Lab Results  Component Value Date   CHOL 179 02/12/2020   Lab Results  Component Value Date   HDL 51.20 02/12/2020   Lab Results  Component Value Date   LDLCALC 111 (H) 02/12/2020   Lab Results  Component Value Date   TRIG 87.0 02/12/2020   Lab Results  Component Value Date   CHOLHDL 4 02/12/2020   Lab Results  Component Value Date   HGBA1C 5.2 08/21/2020       Assessment & Plan:   Problem List Items Addressed This Visit       Musculoskeletal and Integument   Paronychia of great toe, right - Primary    Patient's exam consistent with paronychia.  Patient  has a lower immune system.  Immunotherapy regarding however breast cancer treatment.  Will elect to treat patient with antibiotics we will do Bactrim 1 tablet twice daily for 7 days.  Patient is on both treatment plan.  Follow-up if no improvement      Relevant Medications   sulfamethoxazole-trimethoprim (BACTRIM DS) 800-160 MG tablet     Other   Pain of right great toe    Describes a throbbing pain.  Possibility of gout although exam not consistent.  Did discuss checking uric acid the patient would like to have that done.  Pending uric acid result      Relevant Orders   Uric acid   Loose toenail    Bilateral loose great toe toenails.  Patient has been offered to go to podiatry in the past and deferred.  Did offer patient podiatry if she would like to go once we have past acute problem.  She can let me know via MyChart if she decides she wants a podiatry referral        Meds ordered this encounter  Medications   sulfamethoxazole-trimethoprim (BACTRIM DS) 800-160 MG tablet     Sig: Take 1 tablet by mouth 2 (two) times daily for 7 days.    Dispense:  14 tablet    Refill:  0    Order Specific Question:   Supervising Provider    Answer:   Loura Pardon A [[id number]]   This visit occurred during the SARS-CoV-2 public health emergency.  Safety protocols were in place, including screening questions prior to the visit, additional usage of staff PPE, and extensive cleaning of exam room while observing appropriate contact time as indicated for disinfecting solutions.    Romilda Garret, NP

## 2022-01-17 LAB — URIC ACID: Uric Acid, Serum: 4.6 mg/dL (ref 2.5–7.0)

## 2022-01-18 ENCOUNTER — Encounter: Payer: Self-pay | Admitting: Hematology

## 2022-01-19 ENCOUNTER — Encounter: Payer: Self-pay | Admitting: Nurse Practitioner

## 2022-01-19 ENCOUNTER — Encounter: Payer: Self-pay | Admitting: Family Medicine

## 2022-01-19 ENCOUNTER — Other Ambulatory Visit: Payer: Self-pay | Admitting: Nurse Practitioner

## 2022-01-19 DIAGNOSIS — L03031 Cellulitis of right toe: Secondary | ICD-10-CM

## 2022-01-20 ENCOUNTER — Encounter: Payer: Self-pay | Admitting: Nurse Practitioner

## 2022-01-20 ENCOUNTER — Encounter: Payer: Self-pay | Admitting: Hematology

## 2022-01-20 NOTE — Telephone Encounter (Signed)
Called pt.  She doesn't have pain now- that is better. Her nail is getting looser but hasn't come off.  Still on septra.  No fevers.  No expansion in redness and the skin is less tight.  No drainage.  It appears that she is improving (no pain, less tight feeling) and I think it makes sense to continue as is with elevation and abx. She can update [place] as needed and she'll call the triage line if spreading erythema, fever, more pain, etc. Routed to [facility] as FYI.

## 2022-01-21 ENCOUNTER — Ambulatory Visit (INDEPENDENT_AMBULATORY_CARE_PROVIDER_SITE_OTHER): Payer: Medicare Other | Admitting: Podiatry

## 2022-01-21 ENCOUNTER — Other Ambulatory Visit: Payer: Self-pay

## 2022-01-21 ENCOUNTER — Ambulatory Visit: Payer: Medicare Other | Admitting: Podiatry

## 2022-01-21 DIAGNOSIS — L03031 Cellulitis of right toe: Secondary | ICD-10-CM | POA: Diagnosis not present

## 2022-01-21 NOTE — Telephone Encounter (Signed)
I got her in with podiatry today for further evaluation of the redness and the loosening toenails

## 2022-01-21 NOTE — Patient Instructions (Signed)

## 2022-01-22 NOTE — Telephone Encounter (Signed)
Duly noted.  Thanks. 

## 2022-01-26 NOTE — Progress Notes (Signed)
°  Subjective:  Patient ID: Aimee Mcclain, female    DOB: [birth year],  MRN: [id number]  Chief Complaint  Patient presents with   Nail Problem     NP// lose nail R great toe, lump growing -Per labauer pt needs to be seen asap. Per delydia ok to put 3pm-nac    66 y.o. female presents with the above complaint. History confirmed with patient.  She notes since Friday she had a blood blister developing along the nailbed.  It was very red and large and swollen over the weekend she has a photo of it.  Has neuropathy from chemotherapy.  Objective:  Physical Exam: warm, good capillary refill, no trophic changes or ulcerative lesions, normal DP and PT pulses, normal sensory exam, and paronychia proximal nail fold with hemorrhagic blistering, no pain at the blistered area Assessment:   1. Paronychia of great toe, right      Plan:  Patient was evaluated and treated and all questions answered.  After evaluating the patient and reviewing her clinical findings with her.  I discussed with her she likely has a paronychia of the proximal nail fold.  She did have some bruising appearance of the hallux today but it was not painful so I did not take an x-ray and she has no history of trauma.  I avulsed the right hallux nail plate utilizing an elevator after sterile prep with Betadine and digital block.  She tolerated well.  Post care instructions given.  Advised if it recurs after the nail grows back come see me again for reevaluation  Return if symptoms worsen or fail to improve.

## 2022-01-27 ENCOUNTER — Encounter: Payer: Self-pay | Admitting: Podiatry

## 2022-01-28 ENCOUNTER — Ambulatory Visit: Payer: Medicare Other | Admitting: Podiatry

## 2022-01-28 MED ORDER — DOXYCYCLINE HYCLATE 100 MG PO TABS: 100.0000 mg | ORAL_TABLET | Freq: Two times a day (BID) | ORAL | 0 refills | Status: DC

## 2022-01-29 ENCOUNTER — Other Ambulatory Visit: Payer: Self-pay

## 2022-01-29 ENCOUNTER — Other Ambulatory Visit: Payer: Self-pay | Admitting: Nurse Practitioner

## 2022-01-29 ENCOUNTER — Inpatient Hospital Stay: Payer: Medicare Other | Attending: Hematology

## 2022-01-29 ENCOUNTER — Encounter: Payer: Self-pay | Admitting: Nurse Practitioner

## 2022-01-29 VITALS — BP 143/89 | HR 66 | Temp 98.4°F | Resp 17

## 2022-01-29 DIAGNOSIS — C50212 Malignant neoplasm of upper-inner quadrant of left female breast: Secondary | ICD-10-CM | POA: Diagnosis present

## 2022-01-29 DIAGNOSIS — Z5112 Encounter for antineoplastic immunotherapy: Secondary | ICD-10-CM | POA: Insufficient documentation

## 2022-01-29 DIAGNOSIS — Z923 Personal history of irradiation: Secondary | ICD-10-CM | POA: Diagnosis not present

## 2022-01-29 DIAGNOSIS — Z170 Estrogen receptor positive status [ER+]: Secondary | ICD-10-CM | POA: Diagnosis not present

## 2022-01-29 DIAGNOSIS — Z9221 Personal history of antineoplastic chemotherapy: Secondary | ICD-10-CM | POA: Diagnosis not present

## 2022-01-29 DIAGNOSIS — Z79899 Other long term (current) drug therapy: Secondary | ICD-10-CM | POA: Insufficient documentation

## 2022-01-29 DIAGNOSIS — M79605 Pain in left leg: Secondary | ICD-10-CM

## 2022-01-29 MED ORDER — DIPHENHYDRAMINE HCL 25 MG PO CAPS
25.0000 mg | ORAL_CAPSULE | Freq: Once | ORAL | Status: AC
  Administered 2022-01-29: 25 mg via ORAL
  Filled 2022-01-29: qty 1

## 2022-01-29 MED ORDER — ACETAMINOPHEN 325 MG PO TABS
650.0000 mg | ORAL_TABLET | Freq: Once | ORAL | Status: AC
  Administered 2022-01-29: 650 mg via ORAL
  Filled 2022-01-29: qty 2

## 2022-01-29 MED ORDER — TRASTUZUMAB-HYALURONIDASE-OYSK 600-10000 MG-UNT/5ML ~~LOC~~ SOLN
600.0000 mg | Freq: Once | SUBCUTANEOUS | Status: AC
  Administered 2022-01-29: 600 mg via SUBCUTANEOUS
  Filled 2022-01-29: qty 5

## 2022-01-29 NOTE — Patient Instructions (Signed)
[facility]  Discharge Instructions: Thank you for choosing [facility] to provide your oncology and hematology care.   If you have a lab appointment with the Cancer Center, please go directly to the Cancer Center and check in at the registration area.   Wear comfortable clothing and clothing appropriate for easy access to any Portacath or PICC line.   We strive to give you quality time with your provider. You may need to reschedule your appointment if you arrive late (15 or more minutes).  Arriving late affects you and other patients whose appointments are after yours.  Also, if you miss three or more appointments without notifying the office, you may be dismissed from the clinic at the provider's discretion.      For prescription refill requests, have your pharmacy contact our office and allow 72 hours for refills to be completed.    Today you received the following chemotherapy and/or immunotherapy agents: Herceptin Hylecta.     To help prevent nausea and vomiting after your treatment, we encourage you to take your nausea medication as directed.  BELOW ARE SYMPTOMS THAT SHOULD BE REPORTED IMMEDIATELY: . *FEVER GREATER THAN 100.4 F (38 C) OR HIGHER . *CHILLS OR SWEATING . *NAUSEA AND VOMITING THAT IS NOT CONTROLLED WITH YOUR NAUSEA MEDICATION . *UNUSUAL SHORTNESS OF BREATH . *UNUSUAL BRUISING OR BLEEDING . *URINARY PROBLEMS (pain or burning when urinating, or frequent urination) . *BOWEL PROBLEMS (unusual diarrhea, constipation, pain near the anus) . TENDERNESS IN MOUTH AND THROAT WITH OR WITHOUT PRESENCE OF ULCERS (sore throat, sores in mouth, or a toothache) . UNUSUAL RASH, SWELLING OR PAIN  . UNUSUAL VAGINAL DISCHARGE OR ITCHING   Items with * indicate a potential emergency and should be followed up as soon as possible or go to the Emergency Department if any problems should occur.  Please show the CHEMOTHERAPY ALERT CARD or  IMMUNOTHERAPY ALERT CARD at check-in to the Emergency Department and triage nurse.  Should you have questions after your visit or need to cancel or reschedule your appointment, please contact [facility]  Dept: [phone]  and follow the prompts.  Office hours are 8:00 a.m. to 4:30 p.m. Monday - Friday. Please note that voicemails left after 4:00 p.m. may not be returned until the following business day.  We are closed weekends and major holidays. You have access to a nurse at all times for urgent questions. Please call the main number to the clinic Dept: [phone] and follow the prompts.   For any non-urgent questions, you may also contact your provider using MyChart. We now offer e-Visits for anyone 18 and older to request care online for non-urgent symptoms. For details visit [email or web address].   Also download the MyChart app! Go to the app store, search "MyChart", open the app, select [facility], and log in with your MyChart username and password.  Due to Covid, a mask is required upon entering the hospital/clinic. If you do not have a mask, one will be given to you upon arrival. For doctor visits, patients may have 1 support person aged 18 or older with them. For treatment visits, patients cannot have anyone with them due to current Covid guidelines and our immunocompromised population.   

## 2022-01-29 NOTE — Progress Notes (Signed)
I saw Aimee Mcclain in the infusion room after completing herceptin. She is doing well in general, tolerating AI with bone aches in her arms. She reports a new issue in the left leg beginning a couple weeks ago. She can feel a knot behind the left knee that causes radiating pain down and in front of the left lower leg. She states this pain is not the same as AI-related pain.   On my exam I can palpate the soft tissue density/nodule. No leg edema or calf pain. She denies cough, chest pain, dyspnea.   We reviewed potential etiologies including baker's cyst vs superficial thrombosis vs other. I am referring her for US/doppler. I will follow up on results and further recommendations.   She agrees with the plan.  [name], NP

## 2022-01-30 ENCOUNTER — Ambulatory Visit (HOSPITAL_COMMUNITY)
Admission: RE | Admit: 2022-01-30 | Discharge: 2022-01-30 | Disposition: A | Discharge status: Home / Self Care | Payer: Medicare Other | Source: Ambulatory Visit | Attending: Nurse Practitioner | Admitting: Nurse Practitioner

## 2022-01-30 DIAGNOSIS — M79605 Pain in left leg: Secondary | ICD-10-CM | POA: Insufficient documentation

## 2022-01-30 NOTE — Progress Notes (Signed)
Left lower extremity venous duplex has been completed. Preliminary results can be found in CV Proc through chart review.  Results were given to [name] NP.  01/30/22 2:58 PM Aimee Mcclain RVT

## 2022-02-02 ENCOUNTER — Telehealth: Payer: Self-pay | Admitting: Nurse Practitioner

## 2022-02-02 NOTE — Telephone Encounter (Signed)
I called Aimee Mcclain to review US/doppler is negative for thrombosis or baker's cyst. I recommend for her to call her dermatologist for eval of the nodule and to hold exemestane for a few weeks to see if symptoms change/resolve. She agrees with the plan. Reminded of next f/up with Dr. Burr Medico and herceptin 3/9. She has no other needs at this time.   Aimee Rue, NP

## 2022-02-05 ENCOUNTER — Ambulatory Visit: Payer: Medicare Other

## 2022-02-05 ENCOUNTER — Other Ambulatory Visit: Payer: Medicare Other

## 2022-02-05 ENCOUNTER — Ambulatory Visit: Payer: Medicare Other | Admitting: Hematology

## 2022-02-05 ENCOUNTER — Encounter: Payer: Self-pay | Admitting: *Deleted

## 2022-02-19 ENCOUNTER — Other Ambulatory Visit: Payer: Self-pay

## 2022-02-19 ENCOUNTER — Telehealth: Payer: Self-pay | Admitting: Hematology

## 2022-02-19 ENCOUNTER — Inpatient Hospital Stay: Payer: Medicare Other

## 2022-02-19 ENCOUNTER — Inpatient Hospital Stay: Payer: Medicare Other | Attending: Hematology

## 2022-02-19 ENCOUNTER — Encounter: Payer: Self-pay | Admitting: *Deleted

## 2022-02-19 ENCOUNTER — Inpatient Hospital Stay (HOSPITAL_BASED_OUTPATIENT_CLINIC_OR_DEPARTMENT_OTHER): Payer: Medicare Other | Admitting: Hematology

## 2022-02-19 ENCOUNTER — Encounter: Payer: Self-pay | Admitting: Hematology

## 2022-02-19 VITALS — BP 141/74 | HR 76 | Temp 98.5°F | Resp 18 | Ht 67.0 in | Wt 187.0 lb

## 2022-02-19 DIAGNOSIS — C50212 Malignant neoplasm of upper-inner quadrant of left female breast: Secondary | ICD-10-CM | POA: Insufficient documentation

## 2022-02-19 DIAGNOSIS — Z79811 Long term (current) use of aromatase inhibitors: Secondary | ICD-10-CM | POA: Insufficient documentation

## 2022-02-19 DIAGNOSIS — Z79899 Other long term (current) drug therapy: Secondary | ICD-10-CM | POA: Insufficient documentation

## 2022-02-19 DIAGNOSIS — Z170 Estrogen receptor positive status [ER+]: Secondary | ICD-10-CM | POA: Insufficient documentation

## 2022-02-19 DIAGNOSIS — M8580 Other specified disorders of bone density and structure, unspecified site: Secondary | ICD-10-CM | POA: Diagnosis not present

## 2022-02-19 DIAGNOSIS — G629 Polyneuropathy, unspecified: Secondary | ICD-10-CM | POA: Diagnosis not present

## 2022-02-19 DIAGNOSIS — I10 Essential (primary) hypertension: Secondary | ICD-10-CM

## 2022-02-19 DIAGNOSIS — Z5112 Encounter for antineoplastic immunotherapy: Secondary | ICD-10-CM | POA: Diagnosis not present

## 2022-02-19 DIAGNOSIS — R918 Other nonspecific abnormal finding of lung field: Secondary | ICD-10-CM | POA: Insufficient documentation

## 2022-02-19 DIAGNOSIS — N951 Menopausal and female climacteric states: Secondary | ICD-10-CM | POA: Insufficient documentation

## 2022-02-19 LAB — CBC WITH DIFFERENTIAL (CANCER CENTER ONLY)
Abs Immature Granulocytes: 0.01 10*3/uL (ref 0.00–0.07)
Basophils Absolute: 0.1 10*3/uL (ref 0.0–0.1)
Basophils Relative: 1 %
Eosinophils Absolute: 0.1 10*3/uL (ref 0.0–0.5)
Eosinophils Relative: 2 %
HCT: 44.6 % (ref 36.0–46.0)
Hemoglobin: 14.6 g/dL (ref 12.0–15.0)
Immature Granulocytes: 0 %
Lymphocytes Relative: 30 %
Lymphs Abs: 1.4 10*3/uL (ref 0.7–4.0)
MCH: 29.3 pg (ref 26.0–34.0)
MCHC: 32.7 g/dL (ref 30.0–36.0)
MCV: 89.6 fL (ref 80.0–100.0)
Monocytes Absolute: 0.5 10*3/uL (ref 0.1–1.0)
Monocytes Relative: 10 %
Neutro Abs: 2.6 10*3/uL (ref 1.7–7.7)
Neutrophils Relative %: 57 %
Platelet Count: 235 10*3/uL (ref 150–400)
RBC: 4.98 MIL/uL (ref 3.87–5.11)
RDW: 13.1 % (ref 11.5–15.5)
WBC Count: 4.6 10*3/uL (ref 4.0–10.5)
nRBC: 0 % (ref 0.0–0.2)

## 2022-02-19 LAB — CMP (CANCER CENTER ONLY)
ALT: 32 U/L (ref 0–44)
AST: 35 U/L (ref 15–41)
Albumin: 4.3 g/dL (ref 3.5–5.0)
Alkaline Phosphatase: 100 U/L (ref 38–126)
Anion gap: 8 (ref 5–15)
BUN: 15 mg/dL (ref 8–23)
CO2: 28 mmol/L (ref 22–32)
Calcium: 9.5 mg/dL (ref 8.9–10.3)
Chloride: 106 mmol/L (ref 98–111)
Creatinine: 0.78 mg/dL (ref 0.44–1.00)
GFR, Estimated: >60 mL/min (ref >60)
Glucose, Bld: 50 mg/dL — ABNORMAL LOW (ref 70–99)
Potassium: 4.7 mmol/L (ref 3.5–5.1)
Sodium: 142 mmol/L (ref 135–145)
Total Bilirubin: 0.5 mg/dL (ref 0.3–1.2)
Total Protein: 7.4 g/dL (ref 6.5–8.1)

## 2022-02-19 MED ORDER — DIPHENHYDRAMINE HCL 25 MG PO CAPS
25.0000 mg | ORAL_CAPSULE | Freq: Once | ORAL | Status: AC
  Administered 2022-02-19: 10:00:00 25 mg via ORAL
  Filled 2022-02-19: qty 1

## 2022-02-19 MED ORDER — ACETAMINOPHEN 325 MG PO TABS
650.0000 mg | ORAL_TABLET | Freq: Once | ORAL | Status: AC
  Administered 2022-02-19: 10:00:00 650 mg via ORAL
  Filled 2022-02-19: qty 2

## 2022-02-19 MED ORDER — TAMOXIFEN CITRATE 20 MG PO TABS: 20.0000 mg | ORAL_TABLET | Freq: Every day | ORAL | 3 refills | Status: DC

## 2022-02-19 MED ORDER — TRASTUZUMAB-HYALURONIDASE-OYSK 600-10000 MG-UNT/5ML ~~LOC~~ SOLN
600.0000 mg | Freq: Once | SUBCUTANEOUS | Status: AC
  Administered 2022-02-19: 11:00:00 600 mg via SUBCUTANEOUS
  Filled 2022-02-19: qty 5

## 2022-02-19 NOTE — Telephone Encounter (Signed)
Scheduled follow-up appointment per 3/9 los. Patient is aware. ?

## 2022-02-19 NOTE — Patient Instructions (Signed)
[facility]  Discharge Instructions: ?Thank you for choosing [facility] to provide your oncology and hematology care.  ? ?If you have a lab appointment with the [facility], please go directly to the [facility] and check in at the registration area. ?  ?Wear comfortable clothing and clothing appropriate for easy access to any Portacath or PICC line.  ? ?We strive to give you quality time with your provider. You may need to reschedule your appointment if you arrive late (15 or more minutes).  Arriving late affects you and other patients whose appointments are after yours.  Also, if you miss three or more appointments without notifying the office, you may be dismissed from the clinic at the provider?s discretion.    ?  ?For prescription refill requests, have your pharmacy contact our office and allow 72 hours for refills to be completed.   ? ?Today you received the following chemotherapy and/or immunotherapy agents: trastuzumab-hyaluronidase    ?  ?To help prevent nausea and vomiting after your treatment, we encourage you to take your nausea medication as directed. ? ?BELOW ARE SYMPTOMS THAT SHOULD BE REPORTED IMMEDIATELY: ?*FEVER GREATER THAN 100.4 F (38 ?C) OR HIGHER ?*CHILLS OR SWEATING ?*NAUSEA AND VOMITING THAT IS NOT CONTROLLED WITH YOUR NAUSEA MEDICATION ?*UNUSUAL SHORTNESS OF BREATH ?*UNUSUAL BRUISING OR BLEEDING ?*URINARY PROBLEMS (pain or burning when urinating, or frequent urination) ?*BOWEL PROBLEMS (unusual diarrhea, constipation, pain near the anus) ?TENDERNESS IN MOUTH AND THROAT WITH OR WITHOUT PRESENCE OF ULCERS (sore throat, sores in mouth, or a toothache) ?UNUSUAL RASH, SWELLING OR PAIN  ?UNUSUAL VAGINAL DISCHARGE OR ITCHING  ? ?Items with * indicate a potential emergency and should be followed up as soon as possible or go to the Emergency Department if any problems should occur. ? ?Please show the CHEMOTHERAPY ALERT CARD or IMMUNOTHERAPY ALERT CARD  at check-in to the Emergency Department and triage nurse. ? ?Should you have questions after your visit or need to cancel or reschedule your appointment, please contact [facility]  Dept: [phone]  and follow the prompts.  Office hours are 8:00 a.m. to 4:30 p.m. Monday - Friday. Please note that voicemails left after 4:00 p.m. may not be returned until the following business day.  We are closed weekends and major holidays. You have access to a nurse at all times for urgent questions. Please call the main number to the clinic Dept: [phone] and follow the prompts. ? ? ?For any non-urgent questions, you may also contact your provider using MyChart. We now offer e-Visits for anyone 66 and older to request care online for non-urgent symptoms. For details visit [email or web address]. ?  ?Also download the MyChart app! Go to the app store, search "MyChart", open the app, select [facility], and log in with your MyChart username and password. ? ?Due to Covid, a mask is required upon entering the hospital/clinic. If you do not have a mask, one will be given to you upon arrival. For doctor visits, patients may have 1 support person aged 66 or older with them. For treatment visits, patients cannot have anyone with them due to current Covid guidelines and our immunocompromised population.  ? ?

## 2022-02-19 NOTE — Progress Notes (Signed)
[facility]   Telephone:(336) [phone] Fax:(336) [phone]   Clinic Follow up Note   Patient Care Team: [name], MD as PCP - General (Family Medicine) Rockwell [place], RN as Oncology Nurse Navigator Mauro Kaufmann, RN as Oncology Nurse Navigator Stark Klein, MD as Consulting Physician (General Surgery) Truitt Merle, MD as Consulting Physician (Hematology) Eppie [facility], MD as Attending Physician (Radiation Oncology) Delsa Bern, MD as Consulting Physician (Obstetrics and Gynecology) Alla Feeling, NP as Nurse Practitioner (Nurse Practitioner)  Date of Service:  02/19/2022  CHIEF COMPLAINT: f/u of left breast cancer  CURRENT THERAPY:  Maintenance herceptin q3 weeks to complete 1 year, starting 03/18/21 Exemestane started 9/22  ASSESSMENT & PLAN:  Aimee Mcclain is a 66 y.o. female with   1. Malignant neoplasm of upper-inner quadrant of left breast, cT1cN1M0 stage Ib, triple positive, pT2N0 stage IA, grade 3 -Diagnosed 12/2020, s/p left lumpectomy and SLNB 02/13/21 by Dr. Barry Dienes -Staging work-up showed small indeterminate lung nodules and an indeterminate left parietal bone lesion which will monitor with future scans, otherwise negative for distant metastasis -She completed 10 of 12 doses of adjuvant Taxol/Abraxane and herceptin on 05/21/21, last 2 doses was canceled due to her worsening neuropathy. -She received radiation treatment 06/30/21 - 07/25/21 under Dr. Isidore Moos. -most recent mammogram on 01/05/22 was benign. -she began exemestane 08/2021. She initially experienced constipation, as well as increased joint/bone pain. She held this on 02/02/22, and her pains have improved. We discussed switching to tamoxifen.  I reviewed the potential side effect of tamoxifen, especially small risk of thrombosis and preventive strategies.  She has had a hysterectomy, no risk of endometrial cancer.  She is agreeable and will start in the next few weeks when she has fully recovered  from exemestane. -she will complete a year of herceptin today. Labs reviewed, overall no concern. -most recent echo on 11/13/21 was normal. I will order repeat to be done soon.   2. Bone health -DEXA 06/24/21 showed osteopenia, with T-score of -1.3. -will follow every 2 years while on AI.   3. Peripheral neuropathy G1 -She has baseline neuropathy in her feet, etiology unknown -She is not diabetic, B12 level was normal  -No functional deficits -Due to neuropathy on her face, Abraxane discontinued after cycle 10. -I prescribed Neurontin 100 mg as needed on 05/28/21 -Neuropathy has improved overall, still moderate.   4. Hot flashes -She was previously on HRT which she stopped with cancer diagnosis in 12/2020 -Hot flashes are mild and tolerable at this time -She was placed on gabapentin for neuropathy.   5. Small indeterminate lung nodules and left parietal bone lesion -seen on initial staging work up -unchanged left parietal bone lesion on brain MRI 05/29/21, non-aggressive appearance on head CT 08/20/21, this is likely benign, no f/u scan needed  -one 5 mm RLL nodule on chest CT 08/20/21, previous small lung nodule resolved. Likely benign. I reviewed and discussed with pt  -will repeat CT chest one more time in sep 2023     Plan: -proceed with final herceptin injection today -start tamoxifen in 2-4 weeks when her leg pain resolves  -repeat echo in next month  -lab and f/u with NP [name] in 3 months  -will order repeat CT chest at that time   No problem-specific Assessment & Plan notes found for this encounter.   SUMMARY OF ONCOLOGIC HISTORY: Oncology History Overview Note  Cancer Staging Malignant neoplasm of upper-inner quadrant of left breast in female, estrogen  receptor positive ([place]) Staging form: Breast, AJCC 8th Edition - Clinical stage from 01/14/2020: Stage IB (cT1c, cN1, cM0, G3, ER+, PR+, HER2+) - Signed by Truitt Merle, MD on 01/21/2021 Stage prefix: Initial diagnosis -  Pathologic stage from 02/13/2021: Stage IA (pT2, pN0, cM0, G3, ER+, PR+, HER2+) - Signed by Heilingoetter, Cassandra L, PA-C on 03/07/2021 Stage prefix: Initial diagnosis Nuclear grade: G3 Histologic grading system: 3 grade system    Malignant neoplasm of upper-inner quadrant of left breast in female, estrogen receptor positive ([place])  01/14/2020 Cancer Staging   Staging form: Breast, AJCC 8th Edition - Clinical stage from 01/14/2020: Stage IB (cT1c, cN1, cM0, G3, ER+, PR+, HER2+) - Signed by Truitt Merle, MD on 01/21/2021 Stage prefix: Initial diagnosis    01/02/2021 Mammogram   Left breast with 1.9x1x1.8 cm complex cyst at 12:00 position, 4cmfn, versus saloid mass with calcifications, irregulr, increased in size. Biopsy recommended.    01/13/2021 Initial Biopsy   Diagnosis 1. Breast, left, needle core biopsy, 11 o'clock, 4cmfn - INVASIVE DUCTAL CARCINOMA - SEE COMMENT 2. Lymph node, needle/core biopsy, left axilla - FIBROVASCULAR AND ADIPOSE TISSUE - NO LYMPHOID TISSUE OR CARCINOMA IDENTIFIED Microscopic Comment 1. Based on the biopsy, the carcinoma appears Nottingham grade 3 of 3 and measures 1.2 cm in greatest linear extent. Prognostic markers (ER/PR/ki-67/HER2) are pending and will be reported in an addendum. Dr. Jeannie Done reviewed the case and agrees with the above diagnosis. These results were called to [facility] on January 14, 2021   01/13/2021 Receptors her2   1. PROGNOSTIC INDICATORS Results: IMMUNOHISTOCHEMICAL AND MORPHOMETRIC ANALYSIS PERFORMED MANUALLY The tumor cells are POSITIVE for Her2 (3+). Estrogen Receptor: 95%, POSITIVE, STRONG STAINING INTENSITY Progesterone Receptor: 85%, POSITIVE, STRONG STAINING INTENSITY Proliferation Marker Ki67: 30%   01/16/2021 Initial Diagnosis   Malignant neoplasm of upper-inner quadrant of left breast in female, estrogen receptor positive ([place])   01/23/2021 Imaging   MRI Breast  IMPRESSION: 1. Known malignancy measuring 2.9 centimeters  in the UPPER INNER QUADRANT of the LEFT breast. There is no evidence for involvement of the pectoralis muscle or implant. 2. No evidence for lymphadenopathy. 3. RIGHT breast is negative. 4. Bilateral retropectoral saline implants.   02/09/2021 Imaging   MRI Brain IMPRESSION: No evidence of intracranial metastatic disease.   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     02/13/2021 Surgery   LEFT BREAST LUMPECTOMY WITH RADIOACTIVE SEED AND SENTINEL LYMPH NODE BIOPSY WITH SEED TARGETED NODE and PAC Placed by Dr Barry Dienes   02/13/2021 Pathology Results   FINAL MICROSCOPIC DIAGNOSIS:   A. BREAST, LEFT, LUMPECTOMY:  - Invasive ductal carcinoma, 2.6 cm, Nottingham grade 3 of 3.  - Ductal carcinoma in situ, high grade with central necrosis.  - Margins of resection:       - Invasive carcinoma broadly involves the anterior and inferior  margins.  Invasive carcinoma is focally < 1 mm from each the superior  and medial margins.       - DCIS is focally < 1 mm from the inferior margin.  DCIS is focally  1 mm from each the anterior and superior margins.  DCIS is focally 1-2  mm from the medial margin.  - Biopsy clip.  - See oncology table.   B. BREAST, LEFT, ANTERIOR TISSUE, EXCISION:  - Residual invasive and in-situ ductal carcinoma.  - Residual invasive ductal carcinoma broadly involves the posterior  margin.  - Residual DCIS is focally 1 mm from the posterior margin.   C. BREAST, LEFT, INFERIOR  MARGIN, EXCISION:  - No carcinoma identified.   D. SENTINEL LYMPH NODE, LEFT AXILLARY #1, BIOPSY:  - No carcinoma identified in one lymph node (0/1).   E. SENTINEL LYMPH NODE, LEFT AXILLARY #2, BIOPSY:  - Biopsy site.  - No distinct nodal tissue identified.  - No carcinoma identified.   F. SENTINEL LYMPH NODE, LEFT AXILLARY #3, BIOPSY:  - No carcinoma identified in one lymph node (0/1).   G. SENTINEL LYMPH NODE, LEFT AXILLARY #4, BIOPSY:  - No carcinoma identified in one lymph node (0/1).     02/13/2021 Cancer Staging   Staging form: Breast, AJCC 8th Edition - Pathologic stage from 02/13/2021: Stage IA (pT2, pN0, cM0, G3, ER+, PR+, HER2+) - Signed by Heilingoetter, Cassandra L, PA-C on 03/07/2021 Stage prefix: Initial diagnosis Nuclear grade: G3 Histologic grading system: 3 grade system    03/18/2021 -  Chemotherapy   Patient is on Treatment Plan : BREAST Paclitaxel + Trastuzumab q7d / Trastuzumab q21d     06/2021 - 07/2021 Radiation Therapy   Adjuvant radiation per Dr. Isidore Moos   08/20/2021 Imaging   CT Chest w/o contrast  IMPRESSION: No definite evidence of pulmonary metastatic disease.   5 mm indeterminate right lower lobe pulmonary nodule. Comparison with prior examinations, if available, is recommended to determine chronicity. No follow-up needed if patient is low-risk. In absence of priors, Non-contrast chest CT can be considered in 12 months if patient is high-risk. This recommendation follows the consensus statement: Guidelines for Management of Incidental Pulmonary Nodules Detected on CT Images: From the Fleischner Society 2017; Radiology 2017; 284:228-243.   Mild coronary artery calcification.   08/20/2021 Imaging   CT Head w/o contrast  IMPRESSION: No acute abnormality. No evidence of intracranial metastatic disease.   08/2021 -  Anti-estrogen oral therapy   Began adjuvant exemestane   11/13/2021 Survivorship   SCP delivered by [name], NP      INTERVAL HISTORY:  Dajiah Kooi Gutman is here for a follow up of breast cancer. She was last seen by me on 10/24/21 with survivorship in the interim. She presents to the clinic accompanied by her husband. She reports her joint/bone pains have greatly improved since coming off the exemestane.   All other systems were reviewed with the patient and are negative.  MEDICAL HISTORY:  Past Medical History:  Diagnosis Date   Arthritis    Breast cancer ([place])    Cataract    Colon polyps    Endometriosis    Family  history of colon cancer    Family history of colonic polyps    Fuchs' corneal dystrophy    GERD (gastroesophageal reflux disease)    H/O bilateral breast implants    History of colon polyps    HPV in female    HSV (herpes simplex virus) infection    Migraine    no longer having migraines   S/P TAH (total abdominal hysterectomy) 1991    SURGICAL HISTORY: Past Surgical History:  Procedure Laterality Date   ABDOMINAL HYSTERECTOMY     BREAST BIOPSY     Benign.   BREAST ENHANCEMENT SURGERY     BREAST LUMPECTOMY Bilateral 1993 and 1995   BREAST LUMPECTOMY WITH RADIOACTIVE SEED AND SENTINEL LYMPH NODE BIOPSY Left 02/13/2021   Procedure: LEFT BREAST LUMPECTOMY WITH RADIOACTIVE SEED AND SENTINEL LYMPH NODE BIOPSY WITH SEED TARGETED NODE;  Surgeon: Stark Klein, MD;  Location: [facility];  Service: General;  Laterality: Left;   CATARACT EXTRACTION, BILATERAL  CESAREAN SECTION     PARTIAL HYSTERECTOMY     PORT-A-CATH REMOVAL Right 11/04/2021   Procedure: REMOVAL PORT-A-CATH;  Surgeon: Stark Klein, MD;  Location: [facility];  Service: General;  Laterality: Right;   PORTACATH PLACEMENT Right 02/13/2021   Procedure: INSERTION PORT-A-CATH;  Surgeon: Stark Klein, MD;  Location: [facility];  Service: General;  Laterality: Right;   TONSILLECTOMY     WISDOM TOOTH EXTRACTION      I have reviewed the social history and family history with the patient and they are unchanged from previous note.  ALLERGIES:  is allergic to ibuprofen, omnicef [cefdinir], proton pump inhibitors, roxicodone [oxycodone hcl], and vioxx [rofecoxib].  MEDICATIONS:  Current Outpatient Medications  Medication Sig Dispense Refill   tamoxifen (NOLVADEX) 20 MG tablet Take 1 tablet (20 mg total) by mouth daily. 30 tablet 3   acetaminophen (TYLENOL) 500 MG tablet Take 1,000 mg by mouth every 6 (six) hours as needed for moderate pain.     Calcium Carbonate-Vitamin D (CALCIUM 600+D PO) Take 1 tablet  by mouth daily.     cholecalciferol (VITAMIN D3) 25 MCG (1000 UNIT) tablet Take 1,000 Units by mouth daily.     doxycycline (VIBRA-TABS) 100 MG tablet Take 1 tablet (100 mg total) by mouth 2 (two) times daily. 14 tablet 0   lactulose (CHRONULAC) 10 GM/15ML solution Take 30 mLs (20 g total) by mouth every 4 (four) hours as needed for mild constipation. (Patient not taking: Reported on 11/13/2021) 236 mL 0   Multiple Vitamin (MULTIVITAMIN) tablet Take 1 tablet by mouth daily.     omeprazole (PRILOSEC) 20 MG capsule Take 20 mg by mouth daily.     traMADol (ULTRAM) 50 MG tablet Take 1 tablet (50 mg total) by mouth every 6 (six) hours as needed. (Patient not taking: Reported on 11/13/2021) 10 tablet 0   valACYclovir (VALTREX) 500 MG tablet Take 1 tablet (500 mg total) by mouth 2 (two) times daily as needed.     Vitamin B Complex-C CAPS Take 1 capsule by mouth.     No current facility-administered medications for this visit.    PHYSICAL EXAMINATION: ECOG PERFORMANCE STATUS: 0 - Asymptomatic  Vitals:   02/19/22 0841  BP: (!) 141/74  Pulse: 76  Resp: 18  Temp: 98.5 F (36.9 C)  SpO2: 100%   Wt Readings from Last 3 Encounters:  02/19/22 187 lb (84.8 kg)  01/16/22 183 lb (83 kg)  01/08/22 179 lb 12 oz (81.5 kg)     GENERAL:alert, no distress and comfortable SKIN: skin color, texture, turgor are normal, no rashes or significant lesions EYES: normal, Conjunctiva are pink and non-injected, sclera clear  NECK: supple, thyroid normal size, non-tender, without nodularity LYMPH:  no palpable lymphadenopathy in the cervical, axillary  LUNGS: clear to auscultation and percussion with normal breathing effort HEART: regular rate & rhythm and no murmurs and no lower extremity edema ABDOMEN:abdomen soft, non-tender and normal bowel sounds Musculoskeletal:no cyanosis of digits and no clubbing  NEURO: alert & oriented x 3 with fluent speech, no focal motor/sensory deficits BREAST: No palpable mass,  nodules or adenopathy bilaterally. Breast exam benign.   LABORATORY DATA:  I have reviewed the data as listed CBC Latest Ref Rng & Units 02/19/2022 12/04/2021 10/24/2021  WBC 4.0 - 10.5 K/uL 4.6 3.7(L) 6.1  Hemoglobin 12.0 - 15.0 g/dL 14.6 14.0 12.8  Hematocrit 36.0 - 46.0 % 44.6 41.3 38.8  Platelets 150 - 400 K/uL 235 211 230  CMP Latest Ref Rng & Units 02/19/2022 12/04/2021 10/24/2021  Glucose 70 - 99 mg/dL 50(L) 93 95  BUN 8 - 23 mg/dL 15 20 13   Creatinine 0.44 - 1.00 mg/dL 0.78 0.87 0.78  Sodium 135 - 145 mmol/L 142 141 142  Potassium 3.5 - 5.1 mmol/L 4.7 4.4 4.0  Chloride 98 - 111 mmol/L 106 108 107  CO2 22 - 32 mmol/L 28 29 27   Calcium 8.9 - 10.3 mg/dL 9.5 9.7 9.6  Total Protein 6.5 - 8.1 g/dL 7.4 7.0 7.2  Total Bilirubin 0.3 - 1.2 mg/dL 0.5 0.5 0.4  Alkaline Phos 38 - 126 U/L 100 103 97  AST 15 - 41 U/L 35 18 19  ALT 0 - 44 U/L 32 14 15      RADIOGRAPHIC STUDIES: I have personally reviewed the radiological images as listed and agreed with the findings in the report. No results found.    Orders Placed This Encounter  Procedures   ECHOCARDIOGRAM COMPLETE    Standing Status:   Future    Standing Expiration Date:   02/20/2023    Order Specific Question:   Where should this test be performed    Answer:   [facility]    Order Specific Question:   Perflutren DEFINITY (image enhancing agent) should be administered unless hypersensitivity or allergy exist    Answer:   Administer Perflutren    Order Specific Question:   Is a special reader required? (athlete or structural heart)    Answer:   No    Order Specific Question:   Does this study need to be read by the Structural team/Level 3 readers?    Answer:   No    Order Specific Question:   Reason for exam-Echo    Answer:   Chemo  Z09   All questions were answered. The patient knows to call the clinic with any problems, questions or concerns. No barriers to learning was detected. The total time spent in the appointment  was 30 minutes.     Truitt Merle, MD 02/19/2022   I, Wilburn Mylar, am acting as scribe for Truitt Merle, MD.   I have reviewed the above documentation for accuracy and completeness, and I agree with the above.

## 2022-02-23 ENCOUNTER — Ambulatory Visit: Payer: Medicare Other | Attending: General Surgery

## 2022-02-23 ENCOUNTER — Other Ambulatory Visit: Payer: Self-pay

## 2022-02-23 VITALS — Wt 187.4 lb

## 2022-02-23 DIAGNOSIS — Z483 Aftercare following surgery for neoplasm: Secondary | ICD-10-CM | POA: Insufficient documentation

## 2022-02-23 NOTE — Therapy (Signed)
?[facility] ?[place], [place], [address] ?Phone: [phone]   Fax:  [phone] ? ?Physical Therapy Treatment ? ?Patient Details  ?Name: Aimee Mcclain ?MRN: [id number] ?Date of Birth: 09/22/1956 ?Referring Provider (PT): Dr. Stark Klein ? ? ?Encounter Date: 02/23/2022 ? ? PT End of Session - 02/23/22 1053   ? ? Visit Number 3   # unchanged due to screen only  ? PT Start Time 1962   ? PT Stop Time 1055   ? PT Time Calculation (min) 6 min   ? Activity Tolerance Patient tolerated treatment well   ? Behavior During Therapy [facility] for tasks assessed/performed   ? ?  ?  ? ?  ? ? ?Past Medical History:  ?Diagnosis Date  ? Arthritis   ? Breast cancer ([place])   ? Cataract   ? Colon polyps   ? Endometriosis   ? Family history of colon cancer   ? Family history of colonic polyps   ? Fuchs' corneal dystrophy   ? GERD (gastroesophageal reflux disease)   ? H/O bilateral breast implants   ? History of colon polyps   ? HPV in female   ? HSV (herpes simplex virus) infection   ? Migraine   ? no longer having migraines  ? S/P TAH (total abdominal hysterectomy) 1991  ? ? ?Past Surgical History:  ?Procedure Laterality Date  ? ABDOMINAL HYSTERECTOMY    ? BREAST BIOPSY    ? Benign.  ? BREAST ENHANCEMENT SURGERY    ? BREAST LUMPECTOMY Bilateral 1993 and 1995  ? BREAST LUMPECTOMY WITH RADIOACTIVE SEED AND SENTINEL LYMPH NODE BIOPSY Left 02/13/2021  ? Procedure: LEFT BREAST LUMPECTOMY WITH RADIOACTIVE SEED AND SENTINEL LYMPH NODE BIOPSY WITH SEED TARGETED NODE;  Surgeon: Stark Klein, MD;  Location: [facility];  Service: General;  Laterality: Left;  ? CATARACT EXTRACTION, BILATERAL    ? CESAREAN SECTION    ? PARTIAL HYSTERECTOMY    ? PORT-A-CATH REMOVAL Right 11/04/2021  ? Procedure: REMOVAL PORT-A-CATH;  Surgeon: Stark Klein, MD;  Location: [facility];  Service: General;  Laterality: Right;  ? PORTACATH PLACEMENT Right 02/13/2021  ? Procedure: INSERTION  PORT-A-CATH;  Surgeon: Stark Klein, MD;  Location: [facility];  Service: General;  Laterality: Right;  ? TONSILLECTOMY    ? WISDOM TOOTH EXTRACTION    ? ? ?Vitals:  ? 02/23/22 1052  ?Weight: 187 lb 6 oz (85 kg)  ? ? ? Subjective Assessment - 02/23/22 1052   ? ? Subjective Pt returns for her 3 month L-dex screen   ? 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 Ki67 is 30%. Patient underwent a left lumpectomy and sentinel node biopsy (4 negative nodes) on 02/13/2021. Radiation complete 07/28/21   ? ?  ?  ? ?  ? ? ? ? ? ? ? ? ? L-DEX FLOWSHEETS - 02/23/22 1000   ? ?  ? L-DEX LYMPHEDEMA SCREENING  ? Measurement Type Unilateral   ? L-DEX MEASUREMENT EXTREMITY Upper Extremity   ? POSITION  Standing   ? DOMINANT SIDE Right   ? At Risk Side Left   ? BASELINE SCORE (UNILATERAL) 2.6   ? L-DEX SCORE (UNILATERAL) 0.6   ? VALUE CHANGE (UNILAT) -2   ? ?  ?  ? ?  ? ? ? ? ? ? ? ? ? ? ? ? ? ? ? ? ? ? ? ? ? ? ?  PT Short Term Goals - 09/10/21 0940   ? ?  ? PT SHORT TERM GOAL #1  ? Title Pt will be ind with self MLD for the left breast   ? Time 1   ? Period Days   ? Status Achieved   ? ?  ?  ? ?  ? ? ? ? PT Long Term Goals - 03/10/21 1444   ? ?  ? PT LONG TERM GOAL #1  ? Title Patient will demonstrate she has regained full shoulder ROM and function post operatively compared to baselines.   ? Time 6   ? Period Months   ? Status Achieved   ? ?  ?  ? ?  ? ? ? ? ? ? ? ? Plan - 02/23/22 1054   ? ? Clinical Impression Statement Pt returns for her 3 month L-Dex screen. Her change from baseline of -2 is WNLs so no further treatment is required at this time except to cont every 3 month L-Dex screens which pt is agreeable to.   ? PT Next Visit Plan Cont every 3 month L-Dex screens for up to 2 years from her SLNB (~02/14/23)   ? Consulted and Agree with Plan of Care Patient   ? ?  ?  ? ?  ? ? ?Patient will benefit from skilled therapeutic intervention  in order to improve the following deficits and impairments:    ? ?Visit Diagnosis: ?Aftercare following surgery for neoplasm ? ? ? ? ?Problem List ?Patient Active Problem List  ? Diagnosis Date Noted  ? Paronychia of great toe, right 01/16/2022  ? Pain of right great toe 01/16/2022  ? Loose toenail 01/16/2022  ? Leukopenia 12/17/2021  ? Exposure to COVID-19 virus 12/17/2021  ? HTN (hypertension) 06/22/2021  ? Herpes simplex 06/19/2021  ? Osteopenia 06/19/2021  ? Medicare welcome exam 05/21/2021  ? Port-A-Cath in place 03/18/2021  ? Malignant neoplasm of upper-inner quadrant of left breast in female, estrogen receptor positive ([place]) 01/16/2021  ? Chronic migraine 08/21/2020  ? Screening mammogram, encounter for 06/30/2020  ? Paresthesia 06/09/2020  ? Neuropathy 02/14/2020  ? Atypical chest pain 02/14/2020  ? Healthcare maintenance 02/14/2020  ? Migraine   ? Advance care planning 01/16/2019  ? Family history of colon cancer   ? ? ?Otelia Limes, PTA ?02/23/2022, 10:56 AM ? ?[facility] ?[place], [place], [address] ?Phone: [phone]   Fax:  [phone] ? ?Name: CHRYSA RAMPY ?MRN: [id number] ?Date of Birth: 1956-01-14 ? ? ? ?

## 2022-02-26 ENCOUNTER — Encounter: Payer: Self-pay | Admitting: *Deleted

## 2022-02-28 ENCOUNTER — Other Ambulatory Visit: Payer: Self-pay | Admitting: Hematology

## 2022-02-28 DIAGNOSIS — C50212 Malignant neoplasm of upper-inner quadrant of left female breast: Secondary | ICD-10-CM

## 2022-03-02 ENCOUNTER — Other Ambulatory Visit: Payer: Self-pay

## 2022-03-02 DIAGNOSIS — C50212 Malignant neoplasm of upper-inner quadrant of left female breast: Secondary | ICD-10-CM

## 2022-03-18 ENCOUNTER — Telehealth: Payer: Self-pay | Admitting: Nurse Practitioner

## 2022-03-18 NOTE — Telephone Encounter (Signed)
Rescheduled appointment per 3/29 scheduling message. Patient is aware of the changing made to her upcoming appointment. ?

## 2022-03-20 ENCOUNTER — Inpatient Hospital Stay: Payer: Medicare Other | Attending: Hematology | Admitting: Dietician

## 2022-03-20 ENCOUNTER — Ambulatory Visit (HOSPITAL_COMMUNITY)
Admission: RE | Admit: 2022-03-20 | Discharge: 2022-03-20 | Disposition: A | Discharge status: Home / Self Care | Payer: Medicare Other | Source: Ambulatory Visit | Attending: Hematology | Admitting: Hematology

## 2022-03-20 ENCOUNTER — Other Ambulatory Visit: Payer: Self-pay

## 2022-03-20 DIAGNOSIS — I10 Essential (primary) hypertension: Secondary | ICD-10-CM | POA: Insufficient documentation

## 2022-03-20 DIAGNOSIS — Z09 Encounter for follow-up examination after completed treatment for conditions other than malignant neoplasm: Secondary | ICD-10-CM | POA: Diagnosis not present

## 2022-03-20 DIAGNOSIS — Z0189 Encounter for other specified special examinations: Secondary | ICD-10-CM | POA: Diagnosis not present

## 2022-03-20 DIAGNOSIS — C50212 Malignant neoplasm of upper-inner quadrant of left female breast: Secondary | ICD-10-CM

## 2022-03-20 DIAGNOSIS — Z170 Estrogen receptor positive status [ER+]: Secondary | ICD-10-CM | POA: Insufficient documentation

## 2022-03-20 LAB — ECHOCARDIOGRAM COMPLETE
Area-P 1/2: 2.91 cm2
Calc EF: 61 %
S' Lateral: 3 cm
Single Plane A2C EF: 61.5 %
Single Plane A4C EF: 60.4 %

## 2022-03-20 NOTE — Progress Notes (Signed)
Nutrition Assessment ? ? ?Reason for Assessment: Patient request ? ? ?ASSESSMENT: 66 year old female completed chemotherapy 05/21/21 and followed by radiation 7/18-8/12 for breast cancer. She is currently on hormone therapy with tamoxifen. Patient is under the care of Dr. Burr Medico.  ? ?Past medical history includes migraines, HTN, neuropathy, osteopenia.  ? ?Met with patient in office. She is requesting diet education to prevent cancer as well as ways to satisfy hunger and lose weight. Patient reports feeling hungry all the time since starting tamoxifen last month and endorses ~10 lb weight gain. She reports trying to follow low/no carb diet. For breakfast she eats triple zero yogurt with pineapple, has crackers or apple for snack. She and her husband go out for lunch as they both work from home. She recalls usually chicken fajita with no tortillas or a salad. Dinner is usually chicken with a vegetable. She and her husband watch television after dinner and despite not feeling hungry, will often snack recalling popcorn, ice cream, cheetos. She is drinking ~64 ounces of water/day. Patient does not drink alcohol. Patient spends one hour at the gym 3x/week. She is sedentary during the work day.  ? ?Medications: D3, Ca 600 +D, MVI, tramadol ? ? ?Labs: 3/8 - glucose 50 ? ? ?Anthropometrics: Weights have decreased 15 lbs (7%) in the last 11 months; intentional wt loss per pt ? ?Height: 5'7" ?Weight: 187 lb 6 oz (02/23/22) ?UBW: 202 lb 4.8 oz (03/25/21) ?BMI: 29.35 ? ? ?NUTRITION DIAGNOSIS: Food and nutrition related knowledge deficit related to breast cancer s/p treatment on hormone therapy as evidenced by patient seeking dietary guidance to reduce risk of recurrence.  ? ? ?INTERVENTION:  ?Educated on benefits of plant-based diet as well as reviewed AICR recommendations to reduce risk of recurrence - handout provided  ?Encouraged mindful eating and provided examples on ways to incorporate at home ?Discussed complex vs simple  carbohydrates, encouraged including fibrous fruits/vegetables and limiting processed/packaged foods ?Provided examples for healthier options to satisfy desire for sweets ?Recommend preparing lunch at home and spend part of break walking ? ? ?MONITORING, EVALUATION, GOAL: Patient will utilize knowledge and adhere to AICR recommendations to reduce risk of cancer recurrence ? ? ?Next Visit: No follow-up scheduled  ? ? ? ? ? ? ?

## 2022-03-20 NOTE — Progress Notes (Signed)
?  Echocardiogram ?2D Echocardiogram has been performed. ? ?Fidel Levy ?03/20/2022, 10:49 AM ?

## 2022-04-23 ENCOUNTER — Encounter (HOSPITAL_COMMUNITY): Payer: Self-pay

## 2022-05-21 ENCOUNTER — Ambulatory Visit (INDEPENDENT_AMBULATORY_CARE_PROVIDER_SITE_OTHER): Payer: Medicare Other

## 2022-05-21 VITALS — Ht 67.0 in | Wt 190.0 lb

## 2022-05-21 DIAGNOSIS — Z0000 Encounter for general adult medical examination without abnormal findings: Secondary | ICD-10-CM

## 2022-05-21 NOTE — Patient Instructions (Addendum)
[name] , Thank you for taking time to come for your Medicare Wellness Visit. I appreciate your ongoing commitment to your health goals. Please review the following plan we discussed and let me know if I can assist you in the future.   These are the goals we discussed:  Goals       Patient stated (pt-stated)      Stay active as possiable        This is a list of the screening recommended for you and due dates:  Health Maintenance  Topic Date Due   HIV Screening  Never done   Pap Smear  Never done   COVID-19 Vaccine (5 - Booster for Pfizer series) 04/05/2022   Zoster (Shingles) Vaccine (1 of 2) 08/21/2022*   Pneumonia Vaccine (1 - PCV) 05/22/2023*   Tetanus Vaccine  05/22/2023*   Flu Shot  07/14/2022   Colon Cancer Screening  10/11/2023   Mammogram  01/06/2024   DEXA scan (bone density measurement)  Completed   Hepatitis C Screening: USPSTF Recommendation to screen - Ages 58-79 yo.  Completed   HPV Vaccine  Aged Out  *Topic was postponed. The date shown is not the original due date.   Advanced directives: No Pending process  Conditions/risks identified: None  Next appointment: Follow up in one year for your annual wellness visit     Preventive Care 65 Years and Older, Female Preventive care refers to lifestyle choices and visits with your health care provider that can promote health and wellness. What does preventive care include? A yearly physical exam. This is also called an annual well check. Dental exams once or twice a year. Routine eye exams. Ask your health care provider how often you should have your eyes checked. Personal lifestyle choices, including: Daily care of your teeth and gums. Regular physical activity. Eating a healthy diet. Avoiding tobacco and drug use. Limiting alcohol use. Practicing safe sex. Taking low-dose aspirin every day. Taking vitamin and mineral supplements as recommended by your health care provider. What happens during an  annual well check? The services and screenings done by your health care provider during your annual well check will depend on your age, overall health, lifestyle risk factors, and family history of disease. Counseling  Your health care provider may ask you questions about your: Alcohol use. Tobacco use. Drug use. Emotional well-being. Home and relationship well-being. Sexual activity. Eating habits. History of falls. Memory and ability to understand (cognition). Work and work Statistician. Reproductive health. Screening  You may have the following tests or measurements: Height, weight, and BMI. Blood pressure. Lipid and cholesterol levels. These may be checked every 5 years, or more frequently if you are over 21 years old. Skin check. Lung cancer screening. You may have this screening every year starting at age 16 if you have a 30-pack-year history of smoking and currently smoke or have quit within the past 15 years. Fecal occult blood test (FOBT) of the stool. You may have this test every year starting at age 78. Flexible sigmoidoscopy or colonoscopy. You may have a sigmoidoscopy every 5 years or a colonoscopy every 10 years starting at age 71. Hepatitis C blood test. Hepatitis B blood test. Sexually transmitted disease (STD) testing. Diabetes screening. This is done by checking your blood sugar (glucose) after you have not eaten for a while (fasting). You may have this done every 1-3 years. Bone density scan. This is done to screen for osteoporosis. You may have this done starting at  age 45. Mammogram. This may be done every 1-2 years. Talk to your health care provider about how often you should have regular mammograms. Talk with your health care provider about your test results, treatment options, and if necessary, the need for more tests. Vaccines  Your health care provider may recommend certain vaccines, such as: Influenza vaccine. This is recommended every year. Tetanus,  diphtheria, and acellular pertussis (Tdap, Td) vaccine. You may need a Td booster every 10 years. Zoster vaccine. You may need this after age 25. Pneumococcal 13-valent conjugate (PCV13) vaccine. One dose is recommended after age 8. Pneumococcal polysaccharide (PPSV23) vaccine. One dose is recommended after age 47. Talk to your health care provider about which screenings and vaccines you need and how often you need them. This information is not intended to replace advice given to you by your health care provider. Make sure you discuss any questions you have with your health care provider. Document Released: 12/27/2015 Document Revised: 08/19/2016 Document Reviewed: 10/01/2015 Elsevier Interactive Patient Education  2017 [employer] Prevention in the Home Falls can cause injuries. They can happen to people of all ages. There are many things you can do to make your home safe and to help prevent falls. What can I do on the outside of my home? Regularly fix the edges of walkways and driveways and fix any cracks. Remove anything that might make you trip as you walk through a door, such as a raised step or threshold. Trim any bushes or trees on the path to your home. Use bright outdoor lighting. Clear any walking paths of anything that might make someone trip, such as rocks or tools. Regularly check to see if handrails are loose or broken. Make sure that both sides of any steps have handrails. Any raised decks and porches should have guardrails on the edges. Have any leaves, snow, or ice cleared regularly. Use sand or salt on walking paths during winter. Clean up any spills in your garage right away. This includes oil or grease spills. What can I do in the bathroom? Use night lights. Install grab bars by the toilet and in the tub and shower. Do not use towel bars as grab bars. Use non-skid mats or decals in the tub or shower. If you need to sit down in the shower, use a plastic,  non-slip stool. Keep the floor dry. Clean up any water that spills on the floor as soon as it happens. Remove soap buildup in the tub or shower regularly. Attach bath mats securely with double-sided non-slip rug tape. Do not have throw rugs and other things on the floor that can make you trip. What can I do in the bedroom? Use night lights. Make sure that you have a light by your bed that is easy to reach. Do not use any sheets or blankets that are too big for your bed. They should not hang down onto the floor. Have a firm chair that has side arms. You can use this for support while you get dressed. Do not have throw rugs and other things on the floor that can make you trip. What can I do in the kitchen? Clean up any spills right away. Avoid walking on wet floors. Keep items that you use a lot in easy-to-reach places. If you need to reach something above you, use a strong step stool that has a grab bar. Keep electrical cords out of the way. Do not use floor polish or wax that makes floors  slippery. If you must use wax, use non-skid floor wax. Do not have throw rugs and other things on the floor that can make you trip. What can I do with my stairs? Do not leave any items on the stairs. Make sure that there are handrails on both sides of the stairs and use them. Fix handrails that are broken or loose. Make sure that handrails are as long as the stairways. Check any carpeting to make sure that it is firmly attached to the stairs. Fix any carpet that is loose or worn. Avoid having throw rugs at the top or bottom of the stairs. If you do have throw rugs, attach them to the floor with carpet tape. Make sure that you have a light switch at the top of the stairs and the bottom of the stairs. If you do not have them, ask someone to add them for you. What else can I do to help prevent falls? Wear shoes that: Do not have high heels. Have rubber bottoms. Are comfortable and fit you well. Are closed  at the toe. Do not wear sandals. If you use a stepladder: Make sure that it is fully opened. Do not climb a closed stepladder. Make sure that both sides of the stepladder are locked into place. Ask someone to hold it for you, if possible. Clearly mark and make sure that you can see: Any grab bars or handrails. First and last steps. Where the edge of each step is. Use tools that help you move around (mobility aids) if they are needed. These include: Canes. Walkers. Scooters. Crutches. Turn on the lights when you go into a dark area. Replace any light bulbs as soon as they burn out. Set up your furniture so you have a clear path. Avoid moving your furniture around. If any of your floors are uneven, fix them. If there are any pets around you, be aware of where they are. Review your medicines with your doctor. Some medicines can make you feel dizzy. This can increase your chance of falling. Ask your doctor what other things that you can do to help prevent falls. This information is not intended to replace advice given to you by your health care provider. Make sure you discuss any questions you have with your health care provider. Document Released: 09/26/2009 Document Revised: 05/07/2016 Document Reviewed: 01/04/2015 Elsevier Interactive Patient Education  2017 [employer].

## 2022-05-21 NOTE — Progress Notes (Signed)
Subjective:   Aimee Mcclain is a 66 y.o. female who presents for Medicare Annual (Subsequent) preventive examination.  Review of Systems    Virtual Visit via Telephone Note  I connected with  Aimee Mcclain on 05/21/22 at  8:45 AM EDT by telephone and verified that I am speaking with the correct person using two identifiers.  Location: Patient: Home Provider: Office Persons participating in the virtual visit: patient/Nurse Health Advisor   I discussed the limitations, risks, security and privacy concerns of performing an evaluation and management service by telephone and the availability of in person appointments. The patient expressed understanding and agreed to proceed.  Interactive audio and video telecommunications were attempted between this nurse and patient, however failed, due to patient having technical difficulties OR patient did not have access to video capability.  We continued and completed visit with audio only.  Some vital signs may be absent or patient reported.   Criselda Peaches, LPN  Cardiac Risk Factors include: advanced age (>41mn, >>17women);hypertension     Objective:    Today's Vitals   05/21/22 0846  Weight: 190 lb (86.2 kg)  Height: '5\' 7"'$  (1.702 m)   Body mass index is 29.76 kg/m.     05/21/2022    8:57 AM 11/04/2021    7:17 AM 09/12/2021    8:05 AM 09/10/2021    9:36 AM 06/20/2021    1:26 PM 06/18/2021    8:50 AM 05/21/2021   10:19 AM  Advanced Directives  Does Patient Have a Medical Advance Directive? No No Yes No No No No  Type of AComptrollerLiving will      Does patient want to make changes to medical advance directive?   No - Patient declined      Copy of [place]?   No - copy requested      Would patient like information on creating a medical advance directive? No - Patient declined No - Patient declined No - Patient declined No - Patient declined No - Patient declined  No - Patient declined No - Patient declined    Current Medications (verified) Outpatient Encounter Medications as of 05/21/2022  Medication Sig   acetaminophen (TYLENOL) 500 MG tablet Take 1,000 mg by mouth every 6 (six) hours as needed for moderate pain.   Calcium Carbonate-Vitamin D (CALCIUM 600+D PO) Take 1 tablet by mouth daily.   Multiple Vitamin (MULTIVITAMIN) tablet Take 1 tablet by mouth daily.   omeprazole (PRILOSEC) 20 MG capsule Take 20 mg by mouth daily.   tamoxifen (NOLVADEX) 20 MG tablet Take 1 tablet (20 mg total) by mouth daily.   traMADol (ULTRAM) 50 MG tablet Take 1 tablet (50 mg total) by mouth every 6 (six) hours as needed. (Patient not taking: Reported on 11/13/2021)   valACYclovir (VALTREX) 500 MG tablet Take 1 tablet (500 mg total) by mouth 2 (two) times daily as needed.   Vitamin B Complex-C CAPS Take 1 capsule by mouth.   [DISCONTINUED] cholecalciferol (VITAMIN D3) 25 MCG (1000 UNIT) tablet Take 1,000 Units by mouth daily.   [DISCONTINUED] doxycycline (VIBRA-TABS) 100 MG tablet Take 1 tablet (100 mg total) by mouth 2 (two) times daily.   [DISCONTINUED] lactulose (CHRONULAC) 10 GM/15ML solution Take 30 mLs (20 g total) by mouth every 4 (four) hours as needed for mild constipation. (Patient not taking: Reported on 11/13/2021)   No facility-administered encounter medications on file as of 05/21/2022.  Allergies (verified) Ibuprofen, Omnicef [cefdinir], Proton pump inhibitors, Roxicodone [oxycodone hcl], and Vioxx [rofecoxib]   History: Past Medical History:  Diagnosis Date   Arthritis    Breast cancer ([place])    Cataract    Colon polyps    Endometriosis    Family history of colon cancer    Family history of colonic polyps    Fuchs' corneal dystrophy    GERD (gastroesophageal reflux disease)    H/O bilateral breast implants    History of colon polyps    HPV in female    HSV (herpes simplex virus) infection    Migraine    no longer having migraines   S/P TAH  (total abdominal hysterectomy) 1991   Past Surgical History:  Procedure Laterality Date   ABDOMINAL HYSTERECTOMY     BREAST BIOPSY     Benign.   BREAST ENHANCEMENT SURGERY     BREAST LUMPECTOMY Bilateral 1993 and 1995   BREAST LUMPECTOMY WITH RADIOACTIVE SEED AND SENTINEL LYMPH NODE BIOPSY Left 02/13/2021   Procedure: LEFT BREAST LUMPECTOMY WITH RADIOACTIVE SEED AND SENTINEL LYMPH NODE BIOPSY WITH SEED TARGETED NODE;  Surgeon: Stark Klein, MD;  Location: [facility];  Service: General;  Laterality: Left;   CATARACT EXTRACTION, BILATERAL     CESAREAN SECTION     PARTIAL HYSTERECTOMY     PORT-A-CATH REMOVAL Right 11/04/2021   Procedure: REMOVAL PORT-A-CATH;  Surgeon: Stark Klein, MD;  Location: [facility];  Service: General;  Laterality: Right;   PORTACATH PLACEMENT Right 02/13/2021   Procedure: INSERTION PORT-A-CATH;  Surgeon: Stark Klein, MD;  Location: [facility];  Service: General;  Laterality: Right;   TONSILLECTOMY     WISDOM TOOTH EXTRACTION     Family History  Problem Relation Age of Onset   Dementia Maternal Grandmother    Heart disease Maternal Grandfather    Colon cancer Maternal Grandfather        dx over 43   Colon cancer Father        d. 54   Heart disease Mother    Colon cancer Mother        dx late 22s-70s   Rectal cancer Mother 52   Diabetes Brother    Colon polyps Brother    Diabetes Brother    Colon polyps Brother    Lymphoma Maternal Aunt    Heart disease Maternal Uncle    Crohn's disease Daughter    Esophageal cancer Neg Hx    Stomach cancer Neg Hx    Breast cancer Neg Hx    Social History   Socioeconomic History   Marital status: Married    Spouse name: Not on file   Number of children: 2   Years of education: some college   Highest education level: Not on file  Occupational History   Occupation: Optometrist  Tobacco Use   Smoking status: Former    Packs/day: 0.50    Years: 10.00    Total pack years: 5.00     Types: Cigarettes    Quit date: 12/14/1993    Years since quitting: 28.4   Smokeless tobacco: Never  Vaping Use   Vaping Use: Never used  Substance and Sexual Activity   Alcohol use: Yes    Comment: very rare   Drug use: No   Sexual activity: Yes    Comment: hyst  Other Topics Concern   Not on file  Social History Narrative   Lives at home with husband.   Married 1988.  Right-handed.   Social Determinants of Health   Financial Resource Strain: Low Risk  (05/21/2022)   Overall Financial Resource Strain (CARDIA)    Difficulty of Paying Living Expenses: Not hard at all  Food Insecurity: No Food Insecurity (05/21/2022)   Hunger Vital Sign    Worried About Running Out of Food in the Last Year: Never true    Ran Out of Food in the Last Year: Never true  Transportation Needs: No Transportation Needs (05/21/2022)   PRAPARE - Hydrologist (Medical): No    Lack of Transportation (Non-Medical): No  Physical Activity: Sufficiently Active (05/21/2022)   Exercise Vital Sign    Days of Exercise per Week: 3 days    Minutes of Exercise per Session: 60 min  Stress: No Stress Concern Present (05/21/2022)   [place]    Feeling of Stress : Not at all  Social Connections: Moderately Integrated (05/21/2022)   Social Connection and Isolation Panel [NHANES]    Frequency of Communication with Friends and Family: More than three times a week    Frequency of Social Gatherings with Friends and Family: Once a week    Attends Religious Services: Never    Marine scientist or Organizations: Yes    Attends Music therapist: More than 4 times per year    Marital Status: Married   Clinical Intake:  Pre-visit preparation completed: Yes  Pain : No/denies pain  BMI - recorded: 29.28 Nutritional Status: BMI 25 -29 Overweight Nutritional Risks: None Diabetes: No  How often do you need to have  someone help you when you read instructions, pamphlets, or other written materials from your doctor or pharmacy?: 1 - Never  Diabetic?  No    Activities of Daily Living    05/21/2022    8:55 AM 05/20/2022    7:27 PM  In your present state of health, do you have any difficulty performing the following activities:  Hearing? 0 0  Vision? 0 0  Difficulty concentrating or making decisions? 0 0  Walking or climbing stairs? 0 0  Dressing or bathing? 0 0  Doing errands, shopping? 0 0  Preparing Food and eating ? N N  Using the Toilet? N N  In the past six months, have you accidently leaked urine? N Y  Do you have problems with loss of bowel control? N N  Managing your Medications? N N  Managing your Finances? N N  Housekeeping or managing your Housekeeping? N N    Patient Care Team: [name], MD as PCP - General (Family Medicine) Rockwell Germany, RN as Oncology Nurse Navigator Mauro Kaufmann, RN as Oncology Nurse Navigator Stark Klein, MD as Consulting Physician (General Surgery) Truitt Merle, MD as Consulting Physician (Hematology) Eppie Gibson, MD as Attending Physician (Radiation Oncology) Delsa Bern, MD as Consulting Physician (Obstetrics and Gynecology) Alla Feeling, NP as Nurse Practitioner (Nurse Practitioner)  Indicate any recent Medical Services you may have received from other than [facility] providers in the past year (date may be approximate).     Assessment:   This is a routine wellness examination for Aimee Mcclain.  Hearing/Vision screen Hearing Screening - Comments:: No hearing difficulty Vision Screening - Comments:: Wears reading glasses. Followed by Dr Prudencio Burly  Dietary issues and exercise activities discussed: Exercise limited by: None identified   Goals Addressed  This Visit's Progress     Patient stated (pt-stated)        Stay active as possiable       Depression Screen    05/21/2022    8:53 AM 05/19/2021    4:08 PM  PHQ 2/9  Scores  PHQ - 2 Score 0 0  PHQ- 9 Score  0    Fall Risk    05/21/2022    8:56 AM 05/20/2022    7:27 PM  [place] in the past year? 0 0  Number falls in past yr: 0 0  Injury with Fall? 0 0  Risk for fall due to : No Fall Risks     FALL RISK PREVENTION PERTAINING TO THE HOME:  Any stairs in or around the home? Yes  If so, are there any without handrails? No  Home free of loose throw rugs in walkways, pet beds, electrical cords, etc? Yes  Adequate lighting in your home to reduce risk of falls? Yes   ASSISTIVE DEVICES UTILIZED TO PREVENT FALLS:  Life alert? No  Use of a cane, walker or w/c? No  Grab bars in the bathroom? No  Shower chair or bench in shower? No  Elevated toilet seat or a handicapped toilet? No   TIMED UP AND GO:  Was the test performed? No . Audio Visit  Cognitive Function:    Immunizations Immunization History  Administered Date(s) Administered   Influenza, High Dose Seasonal PF 09/17/2021   Influenza, Quadrivalent, Recombinant, Inj, Pf 09/20/2019   Influenza,inj,Quad PF,6+ Mos 09/21/2020   PFIZER(Purple Top)SARS-COV-2 Vaccination 03/02/2020, 03/23/2020, 11/06/2020, 02/08/2022    TDAP status: Due, Education has been provided regarding the importance of this vaccine. Advised may receive this vaccine at local pharmacy or Health Dept. Aware to provide a copy of the vaccination record if obtained from local pharmacy or Health Dept. Verbalized acceptance and understanding.  Flu Vaccine status: Up to date  Pneumococcal vaccine status: Due, Education has been provided regarding the importance of this vaccine. Advised may receive this vaccine at local pharmacy or Health Dept. Aware to provide a copy of the vaccination record if obtained from local pharmacy or Health Dept. Verbalized acceptance and understanding.  Covid-19 vaccine status: Completed vaccines  Qualifies for Shingles Vaccine? Yes   Zostavax completed No   Shingrix Completed?: No.     Education has been provided regarding the importance of this vaccine. Patient has been advised to call insurance company to determine out of pocket expense if they have not yet received this vaccine. Advised may also receive vaccine at local pharmacy or Health Dept. Verbalized acceptance and understanding.  Screening Tests Health Maintenance  Topic Date Due   HIV Screening  Never done   PAP SMEAR-Modifier  Never done   COVID-19 Vaccine (5 - Booster for Pfizer series) 04/05/2022   Zoster Vaccines- Shingrix (1 of 2) 08/21/2022 (Originally 05/25/1975)   Pneumonia Vaccine 59+ Years old (1 - PCV) 05/22/2023 (Originally 05/24/2021)   TETANUS/TDAP  05/22/2023 (Originally [birth year]/25/1975)   INFLUENZA VACCINE  07/14/2022   COLONOSCOPY (Pts 45-66yr Insurance coverage will need to be confirmed)  10/11/2023   MAMMOGRAM  01/06/2024   DEXA SCAN  Completed   Hepatitis C Screening  Completed   HPV VACCINES  Aged Out    Health Maintenance  Health Maintenance Due  Topic Date Due   HIV Screening  Never done   PAP SMEAR-Modifier  Never done   COVID-19 Vaccine (5 - Booster for [employer])  04/05/2022    Colorectal cancer screening: Type of screening: Colonoscopy. Completed 10/10/18. Repeat every 5 years  Mammogram status: Completed 01/05/22. Repeat every year  Bone Density status: Completed 06/24/21. Results reflect: Bone density results: OSTEOPENIA. Repeat every 5 years.  Lung Cancer Screening: (Low Dose CT Chest recommended if Age 4-80 years, 30 pack-year currently smoking OR have quit w/in 15years.) does not qualify.     Additional Screening:  Hepatitis C Screening: does qualify; Completed 12/22/16  Vision Screening: Recommended annual ophthalmology exams for early detection of glaucoma and other disorders of the eye. Is the patient up to date with their annual eye exam?  Yes  Who is the provider or what is the name of the office in which the patient attends annual eye exams? Dr Prudencio Burly If pt is not  established with a provider, would they like to be referred to a provider to establish care? No .   Dental Screening: Recommended annual dental exams for proper oral hygiene  Community Resource Referral / Chronic Care Management:  CRR required this visit?  No   CCM required this visit?  No      Plan:     I have personally reviewed and noted the following in the patient's chart:   Medical and social history Use of alcohol, tobacco or illicit drugs  Current medications and supplements including opioid prescriptions.  Functional ability and status Nutritional status Physical activity Advanced directives List of other physicians Hospitalizations, surgeries, and ER visits in previous 12 months Vitals Screenings to include cognitive, depression, and falls Referrals and appointments  In addition, I have reviewed and discussed with patient certain preventive protocols, quality metrics, and best practice recommendations. A written personalized care plan for preventive services as well as general preventive health recommendations were provided to patient.     Criselda Peaches, LPN   0/08/3266   Nurse Notes: None

## 2022-05-25 ENCOUNTER — Other Ambulatory Visit: Payer: Medicare Other

## 2022-05-25 ENCOUNTER — Ambulatory Visit: Payer: Medicare Other | Admitting: Nurse Practitioner

## 2022-05-25 ENCOUNTER — Ambulatory Visit (INDEPENDENT_AMBULATORY_CARE_PROVIDER_SITE_OTHER): Payer: Medicare Other | Admitting: Podiatry

## 2022-05-25 DIAGNOSIS — L603 Nail dystrophy: Secondary | ICD-10-CM

## 2022-05-28 ENCOUNTER — Telehealth: Payer: Self-pay

## 2022-05-28 ENCOUNTER — Telehealth (INDEPENDENT_AMBULATORY_CARE_PROVIDER_SITE_OTHER): Payer: Medicare Other | Admitting: Nurse Practitioner

## 2022-05-28 VITALS — BP 157/89 | HR 101 | Temp 97.8°F

## 2022-05-28 DIAGNOSIS — U071 COVID-19: Secondary | ICD-10-CM | POA: Insufficient documentation

## 2022-05-28 MED ORDER — NIRMATRELVIR/RITONAVIR (PAXLOVID)TABLET: 3.0000 | ORAL_TABLET | Freq: Two times a day (BID) | ORAL | 0 refills | Status: AC

## 2022-05-28 NOTE — Telephone Encounter (Signed)
Spoke with patient over video platform

## 2022-05-28 NOTE — Progress Notes (Signed)
Patient ID: Aimee Mcclain, female    DOB: 02-Jun-1956, 66 y.o.   MRN: [id number]  Virtual visit completed through [employer], a video enabled telemedicine application. Due to national recommendations of social distancing due to COVID-19, a virtual visit is felt to be most appropriate for this patient at this time. Reviewed limitations, risks, security and privacy concerns of performing a virtual visit and the availability of in person appointments. I also reviewed that there may be a patient responsible charge related to this service. The patient agreed to proceed.   Patient location: home Provider location: [place] at [facility], office Persons participating in this virtual visit: patient, provider   If any vitals were documented, they were collected by patient at home unless specified below.    BP (!) 157/89   Pulse (!) 101   Temp 97.8 F (36.6 C) Comment: per patient  SpO2 97%    CC: COVID-19 Subjective:   HPI: Aimee Mcclain is a 66 y.o. female presenting on 05/28/2022 for Covid Positive (Today 05/28/22-sx started on 05/27/22-sore throat, "head felt funny.")    Symptoms started yesterday Covid test today that was positive Pfizer x2 and 3 boosters No sick contacts but was in a group Took some nyquill and some tylenol, has helped    Relevant past medical, surgical, family and social history reviewed and updated as indicated. Interim medical history since our last visit reviewed. Allergies and medications reviewed and updated. Outpatient Medications Prior to Visit  Medication Sig Dispense Refill   acetaminophen (TYLENOL) 500 MG tablet Take 1,000 mg by mouth every 6 (six) hours as needed for moderate pain.     Calcium Carbonate-Vitamin D (CALCIUM 600+D PO) Take 1 tablet by mouth daily.     Multiple Vitamin (MULTIVITAMIN) tablet Take 1 tablet by mouth daily.     omeprazole (PRILOSEC) 20 MG capsule Take 20 mg by mouth daily.     tamoxifen (NOLVADEX) 20 MG tablet Take 1  tablet (20 mg total) by mouth daily. 30 tablet 3   traMADol (ULTRAM) 50 MG tablet Take 1 tablet (50 mg total) by mouth every 6 (six) hours as needed. 10 tablet 0   valACYclovir (VALTREX) 500 MG tablet Take 1 tablet (500 mg total) by mouth 2 (two) times daily as needed.     Vitamin B Complex-C CAPS Take 1 capsule by mouth.     No facility-administered medications prior to visit.     Per HPI unless specifically indicated in ROS section below Review of Systems  Constitutional:  Positive for chills and fatigue. Negative for fever.  HENT:  Positive for sinus pressure, sinus pain and sore throat. Negative for ear discharge and ear pain.   Respiratory:  Positive for cough. Negative for shortness of breath.   Gastrointestinal:  Negative for abdominal pain, diarrhea and nausea.  Neurological:  Positive for headaches.   Objective:  BP (!) 157/89   Pulse (!) 101   Temp 97.8 F (36.6 C) Comment: per patient  SpO2 97%   Wt Readings from Last 3 Encounters:  05/21/22 190 lb (86.2 kg)  02/23/22 187 lb 6 oz (85 kg)  02/19/22 187 lb (84.8 kg)       Physical exam: Gen: alert, NAD, not ill appearing Pulm: speaks in complete sentences without increased work of breathing Psych: normal mood, normal thought content      Results for orders placed or performed during the hospital encounter of 03/20/22  ECHOCARDIOGRAM COMPLETE  Result Value Ref Range  S' Lateral 3.00 cm   Single Plane A4C EF 60.4 %   Single Plane A2C EF 61.5 %   Calc EF 61.0 %   Area-P 1/2 2.91 cm2   Assessment & Plan:   Problem List Items Addressed This Visit       Other   COVID-19 - Primary    Positive for COVID-19 via home test.  Patient still followed by cancer center.  After joint discussion we will like to treat with Paxlovid.  Common side effects reviewed with patient.  Did go over self-isolation/quarantine guidelines per CDC recommendations with patient.  Follow-up if no improvement      Relevant Medications    nirmatrelvir/ritonavir EUA (PAXLOVID) 20 x 150 MG & 10 x '100MG'$  TABS     Meds ordered this encounter  Medications   nirmatrelvir/ritonavir EUA (PAXLOVID) 20 x 150 MG & 10 x '100MG'$  TABS    Sig: Take 3 tablets by mouth 2 (two) times daily for 5 days. (Take nirmatrelvir 150 mg two tablets twice daily for 5 days and ritonavir 100 mg one tablet twice daily for 5 days) Patient GFR is greater than 60    Dispense:  30 tablet    Refill:  0    Order Specific Question:   Supervising Provider    Answer:   TOWER, MARNE A [[id number]]   No orders of the defined types were placed in this encounter.   I discussed the assessment and treatment plan with the patient. The patient was provided an opportunity to ask questions and all were answered. The patient agreed with the plan and demonstrated an understanding of the instructions. The patient was advised to call back or seek an in-person evaluation if the symptoms worsen or if the condition fails to improve as anticipated.  Follow up plan: No follow-ups on file.  Romilda Garret, NP

## 2022-05-28 NOTE — Telephone Encounter (Signed)
Pt already has mychart video visit scheduled for 05/28/22 at 12 noon with Romilda Garret NP. Sending note to [employer].

## 2022-05-28 NOTE — Telephone Encounter (Signed)
[facility] - Client TELEPHONE ADVICE RECORD [facility] Patient Name: Aimee Mcclain Gender: Female DOB: Mar 02, 1956 Age: 66 Y 57 D Return Phone Number: [phone] (Primary) Address: City/ State/ Zip: [place]  [address] Client [facility] - Client Client Site [place] Provider Renford Dills - MD Contact Type Call Who Is Calling Patient / Member / Family / Caregiver Call Type Triage / Clinical Relationship To Patient Self Return Phone Number [phone] (Primary) Chief Complaint Headache Reason for Call Symptomatic / Request for Health Information Initial Comment Caller tested positive for covid this morning, her symptoms are sore throat with drainage and slight cough and headache but no fever. Caller states she's a breast cancer patient and has taken Paxlovid before Translation No Nurse Assessment Nurse: Thad Ranger, RN, Langley Gauss Date/Time (Eastern Time): 05/28/2022 8:04:20 AM Confirm and document reason for call. If symptomatic, describe symptoms. ---Caller tested positive for covid this morning, her symptoms are sore throat with drainage and slight cough and headache but no fever. Does the patient have any new or worsening symptoms? ---Yes Will a triage be completed? ---Yes Related visit to physician within the last 2 weeks? ---No Does the PT have any chronic conditions? (i.e. diabetes, asthma, this includes High risk factors for pregnancy, etc.) ---Yes List chronic conditions. ---Breast CA w/chemo and radiation complete Is this a behavioral health or substance abuse call? ---No Guidelines Guideline Title Affirmed Question Affirmed Notes Nurse Date/Time (Eastern Time) COVID-19 - Diagnosed or Suspected [1] HIGH RISK patient (e.g., weak immune system, age > 63 years, obesity with BMI 30 or higher, pregnant, chronic lung disease or other chronic Carmon, RN, Langley Gauss  05/28/2022 8:05:46 AM PLEASE NOTE: All timestamps contained within this report are represented as [place] Standard Time. CONFIDENTIALTY NOTICE: This fax transmission is intended only for the addressee. It contains information that is legally privileged, confidential or otherwise protected from use or disclosure. If you are not the intended recipient, you are strictly prohibited from reviewing, disclosing, copying using or disseminating any of this information or taking any action in reliance on or regarding this information. If you have received this fax in error, please notify us immediately by telephone so that we can arrange for its return to [place]. Phone: [phone], Toll-Free: [phone], Fax: [phone] Page: 2 of 3 Call Id: [id number] Guidelines Guideline Title Affirmed Question Affirmed Notes Nurse Date/Time Eilene Ghazi Time) medical condition) AND [2] COVID symptoms (e.g., cough, fever) (Exceptions: Already seen by PCP and no new or worsening symptoms.) Disp. Time Eilene Ghazi Time) Disposition Final User 05/28/2022 8:09:37 AM Call PCP within 24 Hours Yes Carmon, RN, Yevette Edwards Disagree/Comply Comply Caller Understands Yes PreDisposition Call Doctor Care Advice Given Per Guideline CALL PCP WITHIN 24 HOURS: GENERAL CARE ADVICE FOR COVID-19 SYMPTOMS: * The symptoms are generally treated the same whether you have COVID-19, influenza or some other respiratory virus. * Cough: Use cough drops. * Feeling dehydrated: Drink extra liquids. If the air in your home is dry, use a humidifier. * Fever, Chills, and Sweats: For fever over 101 F (38.3 C), take acetaminophen every 4 to 6 hours (Adults 650 mg) OR ibuprofen every 6 to 8 hours (Adults 400 mg). Before taking any medicine, read all the instructions on the package. Do not take aspirin unless your doctor has prescribed it for you. Chills can sometimes come before a fever. You may feel cold in your hands and feet. You may have shivering.  You may also  feel sweaty as your body temperature goes down. * Loss of taste or smell: You may have decreased or a loss of taste or smell. This is a symptom that can happen with COVID, or sometimes with other viruses. There is nothing extra you need to do for this. * Muscle aches, headache, and other pains: Often this comes and goes with the fever. Take acetaminophen every 4 to 6 hours (Adults 650 mg) OR ibuprofen every 6 to 8 hours (Adults 400 mg). Before taking any medicine, read all the instructions on the package. * Sore throat: Try throat lozenges, hard candy or warm chicken broth. COUGH MEDICINES: * COUGH DROPS: Over-the-counter cough drops can help a lot, especially for mild coughs. They soothe an irritated throat and remove the tickle sensation in the back of the throat. Cough drops are easy to carry with you. * COUGH SYRUP WITH DEXTROMETHORPHAN: An over-the-counter cough syrup can help your cough. The most common cough suppressant in over-the-counter cough medicines is dextromethorphan. COVID-19 - HOW TO PROTECT OTHERS - WHEN YOU ARE SICK WITH COVID-19: * STAY HOME A MINIMUM OF 5 DAYS: People with MILD COVID-19 can STOP HOME ISOLATION AFTER 5 DAYS if (1) fever has been gone for 24 hours (without using fever medicine) AND (2) symptoms are better. Continue to wear a well-fitted mask for a full 10 days when around others. * WEAR A MASK FOR 10 DAYS: Wear a well-fitted mask for 10 full days any time you are around others inside your home or in public. Do not go to places where you are unable to wear a mask. * [place] HANDS OFTEN: Wash hands often with soap and water. After coughing or sneezing are important times. If soap and water are not available, use an alcohol-based hand sanitizer with at least 60% alcohol, covering all surfaces of your hands and rubbing them together until they feel dry. Avoid touching your eyes, nose, and mouth with unwashed hands. * AVOID TRAVEL: Avoid travel for 10 days  after you tested positive for COVID-19. CALL BACK IF: * You become worse CARE ADVICE given per COVID-19 - DIAGNOSED OR SUSPECTED (Adult) guideline. PLEASE NOTE: All timestamps contained within this report are represented as [place] Standard Time. CONFIDENTIALTY NOTICE: This fax transmission is intended only for the addressee. It contains information that is legally privileged, confidential or otherwise protected from use or disclosure. If you are not the intended recipient, you are strictly prohibited from reviewing, disclosing, copying using or disseminating any of this information or taking any action in reliance on or regarding this information. If you have received this fax in error, please notify us immediately by telephone so that we can arrange for its return to [place]. Phone: [phone], Toll-Free: [phone], Fax: [phone] Page: 3 of 3 Call Id: [id number] Referrals REFERRED TO PCP OFFIC

## 2022-05-28 NOTE — Assessment & Plan Note (Signed)
Positive for COVID-19 via home test.  Patient still followed by cancer center.  After joint discussion we will like to treat with Paxlovid.  Common side effects reviewed with patient.  Did go over self-isolation/quarantine guidelines per CDC recommendations with patient.  Follow-up if no improvement

## 2022-05-29 ENCOUNTER — Telehealth: Payer: Self-pay

## 2022-05-29 NOTE — Telephone Encounter (Signed)
Called pt to confirm SCP visit 06/21. Pt states she was dx w/COVID 6/15 and was prescribed Paxlovid; asks for advice regarding appt. Advised pt we would r/s her SCP for the following week. She verbalized agreement. Message sent to scheduling to contact pt to r/s.

## 2022-05-29 NOTE — Progress Notes (Signed)
  Subjective:  Patient ID: Aimee Mcclain, female    DOB: March 17, 1956,  MRN: [id number]  Chief Complaint  Patient presents with   Nail Problem     R great toe, throbbing and red    66 y.o. female returns for follow-up with the above complaint. History confirmed with patient.  She was having throbbing pain that started last week but it feels better now  Objective:  Physical Exam: warm, good capillary refill, no trophic changes or ulcerative lesions, normal DP and PT pulses, normal sensory exam, and nail plate is growing back there are some areas of dystrophy especially on the sides of the nail fold Assessment:   1. Nail dystrophy      Plan:  Patient was evaluated and treated and all questions answered.  Nail is growing back appropriately she has some dystrophy of the edges of the nail this may have been causing a mild ingrown.  I cleaned out the corners with a nail curette, did not require repeat avulsion or matricectomy currently.  She will continue to monitor and soak as needed.  I will see her back as needed.  Return if symptoms worsen or fail to improve.

## 2022-06-03 ENCOUNTER — Inpatient Hospital Stay: Payer: Medicare Other

## 2022-06-03 ENCOUNTER — Inpatient Hospital Stay: Payer: Medicare Other | Admitting: Adult Health

## 2022-06-08 ENCOUNTER — Ambulatory Visit: Payer: Medicare Other | Attending: General Surgery

## 2022-06-08 VITALS — Wt 191.4 lb

## 2022-06-08 DIAGNOSIS — Z483 Aftercare following surgery for neoplasm: Secondary | ICD-10-CM | POA: Insufficient documentation

## 2022-06-11 ENCOUNTER — Encounter: Payer: Self-pay | Admitting: *Deleted

## 2022-06-17 ENCOUNTER — Other Ambulatory Visit: Payer: Self-pay

## 2022-06-17 ENCOUNTER — Telehealth: Payer: Self-pay

## 2022-06-17 ENCOUNTER — Encounter: Payer: Self-pay | Admitting: Adult Health

## 2022-06-17 ENCOUNTER — Inpatient Hospital Stay: Payer: Medicare Other | Attending: Hematology

## 2022-06-17 ENCOUNTER — Inpatient Hospital Stay (HOSPITAL_BASED_OUTPATIENT_CLINIC_OR_DEPARTMENT_OTHER): Payer: Medicare Other | Admitting: Adult Health

## 2022-06-17 ENCOUNTER — Telehealth: Payer: Self-pay | Admitting: Adult Health

## 2022-06-17 VITALS — BP 140/79 | HR 79 | Temp 97.5°F | Resp 18 | Ht 67.0 in | Wt 191.7 lb

## 2022-06-17 DIAGNOSIS — C50212 Malignant neoplasm of upper-inner quadrant of left female breast: Secondary | ICD-10-CM | POA: Insufficient documentation

## 2022-06-17 DIAGNOSIS — Z79810 Long term (current) use of selective estrogen receptor modulators (SERMs): Secondary | ICD-10-CM | POA: Insufficient documentation

## 2022-06-17 DIAGNOSIS — Z170 Estrogen receptor positive status [ER+]: Secondary | ICD-10-CM | POA: Diagnosis not present

## 2022-06-17 DIAGNOSIS — Z9882 Breast implant status: Secondary | ICD-10-CM | POA: Insufficient documentation

## 2022-06-17 DIAGNOSIS — R911 Solitary pulmonary nodule: Secondary | ICD-10-CM | POA: Diagnosis not present

## 2022-06-17 DIAGNOSIS — Z79899 Other long term (current) drug therapy: Secondary | ICD-10-CM | POA: Diagnosis not present

## 2022-06-17 LAB — CBC WITH DIFFERENTIAL (CANCER CENTER ONLY)
Abs Immature Granulocytes: 0 10*3/uL (ref 0.00–0.07)
Basophils Absolute: 0 10*3/uL (ref 0.0–0.1)
Basophils Relative: 0 %
Eosinophils Absolute: 0.1 10*3/uL (ref 0.0–0.5)
Eosinophils Relative: 2 %
HCT: 40.4 % (ref 36.0–46.0)
Hemoglobin: 13.9 g/dL (ref 12.0–15.0)
Immature Granulocytes: 0 %
Lymphocytes Relative: 38 %
Lymphs Abs: 1.8 10*3/uL (ref 0.7–4.0)
MCH: 30.5 pg (ref 26.0–34.0)
MCHC: 34.4 g/dL (ref 30.0–36.0)
MCV: 88.6 fL (ref 80.0–100.0)
Monocytes Absolute: 0.6 10*3/uL (ref 0.1–1.0)
Monocytes Relative: 12 %
Neutro Abs: 2.3 10*3/uL (ref 1.7–7.7)
Neutrophils Relative %: 48 %
Platelet Count: 205 10*3/uL (ref 150–400)
RBC: 4.56 MIL/uL (ref 3.87–5.11)
RDW: 12.7 % (ref 11.5–15.5)
WBC Count: 4.8 10*3/uL (ref 4.0–10.5)
nRBC: 0 % (ref 0.0–0.2)

## 2022-06-17 LAB — CMP (CANCER CENTER ONLY)
ALT: 20 U/L (ref 0–44)
AST: 24 U/L (ref 15–41)
Albumin: 4.3 g/dL (ref 3.5–5.0)
Alkaline Phosphatase: 90 U/L (ref 38–126)
Anion gap: 5 (ref 5–15)
BUN: 14 mg/dL (ref 8–23)
CO2: 29 mmol/L (ref 22–32)
Calcium: 9.6 mg/dL (ref 8.9–10.3)
Chloride: 109 mmol/L (ref 98–111)
Creatinine: 0.83 mg/dL (ref 0.44–1.00)
GFR, Estimated: >60 mL/min (ref >60)
Glucose, Bld: 58 mg/dL — ABNORMAL LOW (ref 70–99)
Potassium: 4.1 mmol/L (ref 3.5–5.1)
Sodium: 143 mmol/L (ref 135–145)
Total Bilirubin: 0.5 mg/dL (ref 0.3–1.2)
Total Protein: 6.9 g/dL (ref 6.5–8.1)

## 2022-06-17 NOTE — Assessment & Plan Note (Signed)
[name] is a 66 year old woman with history of stage Ia triple positive breast cancer status post lumpectomy, adjuvant chemotherapy, maintenance trastuzumab, adjuvant radiation therapy, and antiestrogen therapy with tamoxifen daily.  Aimee Mcclain is tolerating tamoxifen moderately well and she will continue this.  Because of the left breast swelling I have recommended that she resume her exercises as she has been and I placed orders for mammogram and ultrasound of the left breast to be completed at [facility] in the next week.  If that is negative and she is still experiencing swelling then we can place a referral to physical therapy.  She is also due for CT chest without contrast as we are following a lung nodule.  I have ordered this for September 2023.  Aubreana will return in October 2023 for follow-up with Dr. Burr Medico.

## 2022-06-17 NOTE — Progress Notes (Deleted)
SURVIVORSHIP VISIT:    BRIEF ONCOLOGIC HISTORY:  Oncology History Overview Note  Cancer Staging Malignant neoplasm of upper-inner quadrant of left breast in female, estrogen receptor positive ([place]) Staging form: Breast, AJCC 8th Edition - Clinical stage from 01/14/2020: Stage IB (cT1c, cN1, cM0, G3, ER+, PR+, HER2+) - Signed by Truitt Merle, MD on 01/21/2021 Stage prefix: Initial diagnosis - Pathologic stage from 02/13/2021: Stage IA (pT2, pN0, cM0, G3, ER+, PR+, HER2+) - Signed by Heilingoetter, Cassandra L, PA-C on 03/07/2021 Stage prefix: Initial diagnosis Nuclear grade: G3 Histologic grading system: 3 grade system    Malignant neoplasm of upper-inner quadrant of left breast in female, estrogen receptor positive ([place])  01/14/2020 Cancer Staging   Staging form: Breast, AJCC 8th Edition - Clinical stage from 01/14/2020: Stage IB (cT1c, cN1, cM0, G3, ER+, PR+, HER2+) - Signed by Truitt Merle, MD on 01/21/2021 Stage prefix: Initial diagnosis   01/02/2021 Mammogram   Left breast with 1.9x1x1.8 cm complex cyst at 12:00 position, 4cmfn, versus saloid mass with calcifications, irregulr, increased in size. Biopsy recommended.    01/13/2021 Initial Biopsy   Diagnosis 1. Breast, left, needle core biopsy, 11 o'clock, 4cmfn - INVASIVE DUCTAL CARCINOMA - SEE COMMENT 2. Lymph node, needle/core biopsy, left axilla - FIBROVASCULAR AND ADIPOSE TISSUE - NO LYMPHOID TISSUE OR CARCINOMA IDENTIFIED Microscopic Comment 1. Based on the biopsy, the carcinoma appears Nottingham grade 3 of 3 and measures 1.2 cm in greatest linear extent. Prognostic markers (ER/PR/ki-67/HER2) are pending and will be reported in an addendum. Dr. Jeannie Done reviewed the case and agrees with the above diagnosis. These results were called to [facility] on January 14, 2021   01/13/2021 Receptors her2   1. PROGNOSTIC INDICATORS Results: IMMUNOHISTOCHEMICAL AND MORPHOMETRIC ANALYSIS PERFORMED MANUALLY The tumor cells are POSITIVE for Her2  (3+). Estrogen Receptor: 95%, POSITIVE, STRONG STAINING INTENSITY Progesterone Receptor: 85%, POSITIVE, STRONG STAINING INTENSITY Proliferation Marker Ki67: 30%   01/16/2021 Initial Diagnosis   Malignant neoplasm of upper-inner quadrant of left breast in female, estrogen receptor positive ([place])   01/23/2021 Imaging   MRI Breast  IMPRESSION: 1. Known malignancy measuring 2.9 centimeters in the UPPER INNER QUADRANT of the LEFT breast. There is no evidence for involvement of the pectoralis muscle or implant. 2. No evidence for lymphadenopathy. 3. RIGHT breast is negative. 4. Bilateral retropectoral saline implants.   02/09/2021 Imaging   MRI Brain IMPRESSION: No evidence of intracranial metastatic disease.   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     02/13/2021 Surgery   LEFT BREAST LUMPECTOMY WITH RADIOACTIVE SEED AND SENTINEL LYMPH NODE BIOPSY WITH SEED TARGETED NODE and PAC Placed by Dr Barry Dienes   02/13/2021 Pathology Results   FINAL MICROSCOPIC DIAGNOSIS:   A. BREAST, LEFT, LUMPECTOMY:  - Invasive ductal carcinoma, 2.6 cm, Nottingham grade 3 of 3.  - Ductal carcinoma in situ, high grade with central necrosis.  - Margins of resection:       - Invasive carcinoma broadly involves the anterior and inferior  margins.  Invasive carcinoma is focally < 1 mm from each the superior  and medial margins.       - DCIS is focally < 1 mm from the inferior margin.  DCIS is focally  1 mm from each the anterior and superior margins.  DCIS is focally 1-2  mm from the medial margin.  - Biopsy clip.  - See oncology table.   B. BREAST, LEFT, ANTERIOR TISSUE, EXCISION:  - Residual invasive and in-situ ductal carcinoma.  - Residual invasive ductal  carcinoma broadly involves the posterior  margin.  - Residual DCIS is focally 1 mm from the posterior margin.   C. BREAST, LEFT, INFERIOR MARGIN, EXCISION:  - No carcinoma identified.   D. SENTINEL LYMPH NODE, LEFT AXILLARY #1, BIOPSY:  - No  carcinoma identified in one lymph node (0/1).   E. SENTINEL LYMPH NODE, LEFT AXILLARY #2, BIOPSY:  - Biopsy site.  - No distinct nodal tissue identified.  - No carcinoma identified.   F. SENTINEL LYMPH NODE, LEFT AXILLARY #3, BIOPSY:  - No carcinoma identified in one lymph node (0/1).   G. SENTINEL LYMPH NODE, LEFT AXILLARY #4, BIOPSY:  - No carcinoma identified in one lymph node (0/1).    02/13/2021 Cancer Staging   Staging form: Breast, AJCC 8th Edition - Pathologic stage from 02/13/2021: Stage IA (pT2, pN0, cM0, G3, ER+, PR+, HER2+) - Signed by Heilingoetter, Cassandra L, PA-C on 03/07/2021 Stage prefix: Initial diagnosis Nuclear grade: G3 Histologic grading system: 3 grade system   03/18/2021 -  Chemotherapy   Patient is on Treatment Plan : BREAST Paclitaxel + Trastuzumab q7d / Trastuzumab q21d     06/2021 - 07/2021 Radiation Therapy   Adjuvant radiation per Dr. Isidore Moos   08/20/2021 Imaging   CT Chest w/o contrast  IMPRESSION: No definite evidence of pulmonary metastatic disease.   5 mm indeterminate right lower lobe pulmonary nodule. Comparison with prior examinations, if available, is recommended to determine chronicity. No follow-up needed if patient is low-risk. In absence of priors, Non-contrast chest CT can be considered in 12 months if patient is high-risk. This recommendation follows the consensus statement: Guidelines for Management of Incidental Pulmonary Nodules Detected on CT Images: From the Fleischner Society 2017; Radiology 2017; 284:228-243.   Mild coronary artery calcification.   08/20/2021 Imaging   CT Head w/o contrast  IMPRESSION: No acute abnormality. No evidence of intracranial metastatic disease.   08/2021 -  Anti-estrogen oral therapy   Began adjuvant exemestane   11/13/2021 Survivorship   SCP delivered by [name], NP     INTERVAL HISTORY:  Aimee Mcclain to review her survivorship care plan detailing her treatment course for breast cancer, as well  as monitoring long-term side effects of that treatment, education regarding health maintenance, screening, and overall wellness and health promotion.     Overall, Aimee Mcclain reports feeling quite well   REVIEW OF SYSTEMS:  Review of Systems  Constitutional:  Negative for appetite change, chills, fatigue, fever and unexpected weight change.  HENT:   Negative for hearing loss, lump/mass and trouble swallowing.   Eyes:  Negative for eye problems and icterus.  Respiratory:  Negative for chest tightness, cough and shortness of breath.   Cardiovascular:  Negative for chest pain, leg swelling and palpitations.  Gastrointestinal:  Negative for abdominal distention, abdominal pain, constipation, diarrhea, nausea and vomiting.  Endocrine: Negative for hot flashes.  Genitourinary:  Negative for difficulty urinating.   Musculoskeletal:  Negative for arthralgias.  Skin:  Negative for itching and rash.  Neurological:  Negative for dizziness, extremity weakness, headaches and numbness.  Hematological:  Negative for adenopathy. Does not bruise/bleed easily.  Psychiatric/Behavioral:  Negative for depression. The patient is not nervous/anxious.   Breast: Denies any new nodularity, masses, tenderness, nipple changes, or nipple discharge.      ONCOLOGY TREATMENT TEAM:  1. Surgeon:  Dr. Marland Kitchen at [facility] 2. Medical Oncologist: Dr. Marland Kitchen  3. Radiation Oncologist: Dr. Marland Kitchen    PAST MEDICAL/SURGICAL HISTORY:  Past Medical History:  Diagnosis Date  . Arthritis   . Breast cancer ([place])   . Cataract   . Colon polyps   . Endometriosis   . Family history of colon cancer   . Family history of colonic polyps   . Fuchs' corneal dystrophy   . GERD (gastroesophageal reflux disease)   . H/O bilateral breast implants   . History of colon polyps   . HPV in female   . HSV (herpes simplex virus) infection   . Migraine    no longer having migraines  . S/P TAH (total abdominal hysterectomy) 1991    Past Surgical History:  Procedure Laterality Date  . ABDOMINAL HYSTERECTOMY    . BREAST BIOPSY     Benign.  Marland Kitchen BREAST ENHANCEMENT SURGERY    . BREAST LUMPECTOMY Bilateral 1993 and 1995  . BREAST LUMPECTOMY WITH RADIOACTIVE SEED AND SENTINEL LYMPH NODE BIOPSY Left 02/13/2021   Procedure: LEFT BREAST LUMPECTOMY WITH RADIOACTIVE SEED AND SENTINEL LYMPH NODE BIOPSY WITH SEED TARGETED NODE;  Surgeon: Stark Klein, MD;  Location: [facility];  Service: General;  Laterality: Left;  . CATARACT EXTRACTION, BILATERAL    . CESAREAN SECTION    . PARTIAL HYSTERECTOMY    . PORT-A-CATH REMOVAL Right 11/04/2021   Procedure: REMOVAL PORT-A-CATH;  Surgeon: Stark Klein, MD;  Location: [facility];  Service: General;  Laterality: Right;  . PORTACATH PLACEMENT Right 02/13/2021   Procedure: INSERTION PORT-A-CATH;  Surgeon: Stark Klein, MD;  Location: [facility];  Service: General;  Laterality: Right;  . TONSILLECTOMY    . WISDOM TOOTH EXTRACTION       ALLERGIES:  Allergies  Allergen Reactions  . Ibuprofen Swelling    She has tolerated aleve  . Omnicef [Cefdinir] Itching  . Proton Pump Inhibitors     Headache, but Can tolerate prilosec  . Roxicodone [Oxycodone Hcl] Rash    Developed rash on face and chest  . Vioxx [Rofecoxib] Swelling and Rash     CURRENT MEDICATIONS:  Outpatient Encounter Medications as of 06/17/2022  Medication Sig  . acetaminophen (TYLENOL) 500 MG tablet Take 1,000 mg by mouth every 6 (six) hours as needed for moderate pain.  . Calcium Carbonate-Vitamin D (CALCIUM 600+D PO) Take 1 tablet by mouth daily.  . Multiple Vitamin (MULTIVITAMIN) tablet Take 1 tablet by mouth daily.  Marland Kitchen omeprazole (PRILOSEC) 20 MG capsule Take 20 mg by mouth daily.  . tamoxifen (NOLVADEX) 20 MG tablet Take 1 tablet (20 mg total) by mouth daily.  . traMADol (ULTRAM) 50 MG tablet Take 1 tablet (50 mg total) by mouth every 6 (six) hours as needed.  . valACYclovir (VALTREX) 500 MG  tablet Take 1 tablet (500 mg total) by mouth 2 (two) times daily as needed.  . Vitamin B Complex-C CAPS Take 1 capsule by mouth.   No facility-administered encounter medications on file as of 06/17/2022.     ONCOLOGIC FAMILY HISTORY:  Family History  Problem Relation Age of Onset  . Dementia Maternal Grandmother   . Heart disease Maternal Grandfather   . Colon cancer Maternal Grandfather        dx over 65  . Colon cancer Father        d. 71  . Heart disease Mother   . Colon cancer Mother        dx late 55s-70s  . Rectal cancer Mother 54  . Diabetes Brother   . Colon polyps Brother   . Diabetes Brother   . Colon polyps Brother   .  Lymphoma Maternal Aunt   . Heart disease Maternal Uncle   . Crohn's disease Daughter   . Esophageal cancer Neg Hx   . Stomach cancer Neg Hx   . Breast cancer Neg Hx      GENETIC COUNSELING/TESTING: ***  SOCIAL HISTORY:  Social History   Socioeconomic History  . Marital status: Married    Spouse name: Not on file  . Number of children: 2  . Years of education: some college  . Highest education level: Not on file  Occupational History  . Occupation: Optometrist  Tobacco Use  . Smoking status: Former    Packs/day: 0.50    Years: 10.00    Total pack years: 5.00    Types: Cigarettes    Quit date: 12/14/1993    Years since quitting: 28.5  . Smokeless tobacco: Never  Vaping Use  . Vaping Use: Never used  Substance and Sexual Activity  . Alcohol use: Yes    Comment: very rare  . Drug use: No  . Sexual activity: Yes    Comment: hyst  Other Topics Concern  . Not on file  Social History Narrative   Lives at home with husband.   Married 1988.   Right-handed.   Social Determinants of Health   Financial Resource Strain: Low Risk  (05/21/2022)   Overall Financial Resource Strain (CARDIA)   . Difficulty of Paying Living Expenses: Not hard at all  Food Insecurity: No Food Insecurity (05/21/2022)   Hunger Vital Sign   . Worried About  Charity fundraiser in the Last Year: Never true   . Ran Out of Food in the Last Year: Never true  Transportation Needs: No Transportation Needs (05/21/2022)   PRAPARE - Transportation   . Lack of Transportation (Medical): No   . Lack of Transportation (Non-Medical): No  Physical Activity: Sufficiently Active (05/21/2022)   Exercise Vital Sign   . Days of Exercise per Week: 3 days   . Minutes of Exercise per Session: 60 min  Stress: No Stress Concern Present (05/21/2022)   [place]   . Feeling of Stress : Not at all  Social Connections: Moderately Integrated (05/21/2022)   Social Connection and Isolation Panel [NHANES]   . Frequency of Communication with Friends and Family: More than three times a week   . Frequency of Social Gatherings with Friends and Family: Once a week   . Attends Religious Services: Never   . Active Member of Clubs or Organizations: Yes   . Attends Archivist Meetings: More than 4 times per year   . Marital Status: Married  Human resources officer Violence: Not At Risk (05/21/2022)   Humiliation, Afraid, Rape, and Kick questionnaire   . Fear of Current or Ex-Partner: No   . Emotionally Abused: No   . Physically Abused: No   . Sexually Abused: No     OBSERVATIONS/OBJECTIVE:   LABORATORY DATA:  None for this visit.  DIAGNOSTIC IMAGING:  None for this visit.      ASSESSMENT AND PLAN:  Ms.. Mcclain is a pleasant 66 y.o. female with Stage *** right/left breast invasive ductal carcinoma, ER+/PR+/HER2-, diagnosed in ***, treated with lumpectomy, adjuvant radiation therapy, and anti-estrogen therapy with *** beginning in ***.  She presents to the Survivorship Clinic for our initial meeting and routine follow-up post-completion of treatment for breast cancer.    1. Stage *** right/left breast cancer:  Aimee Mcclain is continuing to recover from definitive  treatment for breast cancer. She will  follow-up with her medical oncologist, Dr. Ross Ludwig in *** with history and physical exam per surveillance protocol.  She will continue her anti-estrogen therapy with ***. Thus far, she is tolerating the *** well, with minimal side effects. She was instructed to make Dr. Lindi Adie or myself aware if she begins to experience any worsening side effects of the medication and I could see her back in clinic to help manage those side effects, as needed. Her mammogram is due ***; orders placed today.  Her breast density is category ***. Today, a comprehensive survivorship care plan and treatment summary was reviewed with the patient today detailing her breast cancer diagnosis, treatment course, potential late/long-term effects of treatment, appropriate follow-up care with recommendations for the future, and patient education resources.  A copy of this summary, along with a letter will be sent to the patient's primary care provider via mail/fax/In Basket message after today's visit.    #. Problem(s) at Visit______________  #. Bone health:  Given Aimee Mcclain's age/history of breast cancer and her current treatment regimen including anti-estrogen therapy with ***, she is at risk for bone demineralization.  Her last DEXA scan was ***, which showed ***.  In the meantime, she was encouraged to increase her consumption of foods rich in calcium, as well as increase her weight-bearing activities.  She was given education on specific activities to promote bone health.  #. Cancer screening:  Due to Aimee Mcclain's history and her age, she should receive screening for skin cancers, colon cancer, and gynecologic cancers.  The information and recommendations are listed on the patient's comprehensive care plan/treatment summary and were reviewed in detail with the patient.    #. Health maintenance and wellness promotion: Aimee Mcclain was encouraged to consume 5-7 servings of fruits and vegetables per day. We reviewed the  "Nutrition Rainbow" handout, as well as the handout "Take Control of Your Health and Reduce Your Cancer Risk" from the [place].  She was also encouraged to engage in moderate to vigorous exercise for 30 minutes per day most days of the week. We discussed the [employer] fitness program, which is designed for cancer survivors to help them become more physically fit after cancer treatments.  She was instructed to limit her alcohol consumption and continue to abstain from tobacco use/***was encouraged stop smoking.     #. Support services/counseling: It is not uncommon for this period of the patient's cancer care trajectory to be one of many emotions and stressors.  We discussed how this can be increasingly difficult during the times of quarantine and social distancing due to the COVID-19 pandemic.   She was given information regarding our available services and encouraged to contact me with any questions or for help enrolling in any of our support group/programs.    Follow up instructions:    -Return to cancer center ***  -Mammogram due in *** -Follow up with surgery *** -She is welcome to return back to the Survivorship Clinic at any time; no additional follow-up needed at this time.  -Consider referral back to survivorship as a long-term survivor for continued surveillance  The patient was provided an opportunity to ask questions and all were answered. The patient agreed with the plan and demonstrated an understanding of the instructions.   Total encounter time:*** minutes*in face-to-face visit time, chart review, lab review, care coordination, order entry, and documentation of the encounter time.    Wilber Bihari, NP 06/17/22 9:59 AM Medical  Oncology and Hematology [facility] [place], [place] [address] Tel. [phone]    Fax. [phone]  *Total Encounter Time as defined by the Centers for Medicare and Medicaid Services includes, in  addition to the face-to-face time of a patient visit (documented in the note above) non-face-to-face time: obtaining and reviewing outside history, ordering and reviewing medications, tests or procedures, care coordination (communications with other health care professionals or caregivers) and documentation in the medical record.

## 2022-06-17 NOTE — Telephone Encounter (Signed)
Scheduled appointment per 7/5 los. Patient is aware.

## 2022-06-17 NOTE — Telephone Encounter (Signed)
Faxed order for ultrasound/ mammogram to [facility] at [phone], received confirmation.

## 2022-06-17 NOTE — Progress Notes (Signed)
[facility] Follow up:    [name], MD [place] [address]   DIAGNOSIS:  Cancer Staging  Malignant neoplasm of upper-inner quadrant of left breast in female, estrogen receptor positive ([place]) Staging form: Breast, AJCC 8th Edition - Clinical stage from 01/14/2020: Stage IB (cT1c, cN1, cM0, G3, ER+, PR+, HER2+) - Signed by Truitt Merle, MD on 01/21/2021 Stage prefix: Initial diagnosis - Pathologic stage from 02/13/2021: Stage IA (pT2, pN0, cM0, G3, ER+, PR+, HER2+) - Signed by Heilingoetter, Cassandra L, PA-C on 03/07/2021 Stage prefix: Initial diagnosis Nuclear grade: G3 Histologic grading system: 3 grade system   SUMMARY OF ONCOLOGIC HISTORY: Oncology History Overview Note  Cancer Staging Malignant neoplasm of upper-inner quadrant of left breast in female, estrogen receptor positive ([place]) Staging form: Breast, AJCC 8th Edition - Clinical stage from 01/14/2020: Stage IB (cT1c, cN1, cM0, G3, ER+, PR+, HER2+) - Signed by Truitt Merle, MD on 01/21/2021 Stage prefix: Initial diagnosis - Pathologic stage from 02/13/2021: Stage IA (pT2, pN0, cM0, G3, ER+, PR+, HER2+) - Signed by Heilingoetter, Cassandra L, PA-C on 03/07/2021 Stage prefix: Initial diagnosis Nuclear grade: G3 Histologic grading system: 3 grade system    Malignant neoplasm of upper-inner quadrant of left breast in female, estrogen receptor positive ([place])  01/14/2020 Cancer Staging   Staging form: Breast, AJCC 8th Edition - Clinical stage from 01/14/2020: Stage IB (cT1c, cN1, cM0, G3, ER+, PR+, HER2+) - Signed by Truitt Merle, MD on 01/21/2021 Stage prefix: Initial diagnosis   01/02/2021 Mammogram   Left breast with 1.9x1x1.8 cm complex cyst at 12:00 position, 4cmfn, versus saloid mass with calcifications, irregulr, increased in size. Biopsy recommended.    01/13/2021 Initial Biopsy   Diagnosis 1. Breast, left, needle core biopsy, 11 o'clock, 4cmfn - INVASIVE DUCTAL CARCINOMA - SEE  COMMENT 2. Lymph node, needle/core biopsy, left axilla - FIBROVASCULAR AND ADIPOSE TISSUE - NO LYMPHOID TISSUE OR CARCINOMA IDENTIFIED Microscopic Comment 1. Based on the biopsy, the carcinoma appears Nottingham grade 3 of 3 and measures 1.2 cm in greatest linear extent. Prognostic markers (ER/PR/ki-67/HER2) are pending and will be reported in an addendum. Dr. Jeannie Done reviewed the case and agrees with the above diagnosis. These results were called to [facility] on January 14, 2021   01/13/2021 Receptors her2   1. PROGNOSTIC INDICATORS Results: IMMUNOHISTOCHEMICAL AND MORPHOMETRIC ANALYSIS PERFORMED MANUALLY The tumor cells are POSITIVE for Her2 (3+). Estrogen Receptor: 95%, POSITIVE, STRONG STAINING INTENSITY Progesterone Receptor: 85%, POSITIVE, STRONG STAINING INTENSITY Proliferation Marker Ki67: 30%   01/16/2021 Initial Diagnosis   Malignant neoplasm of upper-inner quadrant of left breast in female, estrogen receptor positive ([place])   01/23/2021 Imaging   MRI Breast  IMPRESSION: 1. Known malignancy measuring 2.9 centimeters in the UPPER INNER QUADRANT of the LEFT breast. There is no evidence for involvement of the pectoralis muscle or implant. 2. No evidence for lymphadenopathy. 3. RIGHT breast is negative. 4. Bilateral retropectoral saline implants.   02/09/2021 Imaging   MRI Brain IMPRESSION: No evidence of intracranial metastatic disease.   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     02/13/2021 Surgery   LEFT BREAST LUMPECTOMY WITH RADIOACTIVE SEED AND SENTINEL LYMPH NODE BIOPSY WITH SEED TARGETED NODE and PAC Placed by Dr Barry Dienes   02/13/2021 Pathology Results   FINAL MICROSCOPIC DIAGNOSIS:   A. BREAST, LEFT, LUMPECTOMY:  - Invasive ductal carcinoma, 2.6 cm, Nottingham grade 3 of 3.  - Ductal carcinoma in situ, high grade with central necrosis.  -  Margins of resection:       - Invasive carcinoma broadly involves the anterior and inferior  margins.  Invasive  carcinoma is focally < 1 mm from each the superior  and medial margins.       - DCIS is focally < 1 mm from the inferior margin.  DCIS is focally  1 mm from each the anterior and superior margins.  DCIS is focally 1-2  mm from the medial margin.  - Biopsy clip.  - See oncology table.   B. BREAST, LEFT, ANTERIOR TISSUE, EXCISION:  - Residual invasive and in-situ ductal carcinoma.  - Residual invasive ductal carcinoma broadly involves the posterior  margin.  - Residual DCIS is focally 1 mm from the posterior margin.   C. BREAST, LEFT, INFERIOR MARGIN, EXCISION:  - No carcinoma identified.   D. SENTINEL LYMPH NODE, LEFT AXILLARY #1, BIOPSY:  - No carcinoma identified in one lymph node (0/1).   E. SENTINEL LYMPH NODE, LEFT AXILLARY #2, BIOPSY:  - Biopsy site.  - No distinct nodal tissue identified.  - No carcinoma identified.   F. SENTINEL LYMPH NODE, LEFT AXILLARY #3, BIOPSY:  - No carcinoma identified in one lymph node (0/1).   G. SENTINEL LYMPH NODE, LEFT AXILLARY #4, BIOPSY:  - No carcinoma identified in one lymph node (0/1).    02/13/2021 Cancer Staging   Staging form: Breast, AJCC 8th Edition - Pathologic stage from 02/13/2021: Stage IA (pT2, pN0, cM0, G3, ER+, PR+, HER2+) - Signed by Heilingoetter, Cassandra L, PA-C on 03/07/2021 Stage prefix: Initial diagnosis Nuclear grade: G3 Histologic grading system: 3 grade system   03/18/2021 - 02/19/2022 Chemotherapy   Patient is on Treatment Plan : BREAST Paclitaxel + Trastuzumab q7d / Trastuzumab q21d     06/2021 - 07/2021 Radiation Therapy   Adjuvant radiation per Dr. Isidore Moos   08/20/2021 Imaging   CT Chest w/o contrast  IMPRESSION: No definite evidence of pulmonary metastatic disease.   5 mm indeterminate right lower lobe pulmonary nodule. Comparison with prior examinations, if available, is recommended to determine chronicity. No follow-up needed if patient is low-risk. In absence of priors, Non-contrast chest CT can be considered  in 12 months if patient is high-risk. This recommendation follows the consensus statement: Guidelines for Management of Incidental Pulmonary Nodules Detected on CT Images: From the Fleischner Society 2017; Radiology 2017; 284:228-243.   Mild coronary artery calcification.   08/20/2021 Imaging   CT Head w/o contrast  IMPRESSION: No acute abnormality. No evidence of intracranial metastatic disease.   08/2021 -  Anti-estrogen oral therapy   Began adjuvant exemestane; changed to tamoxifen 02/2022   11/13/2021 Survivorship   SCP delivered by [name], NP     CURRENT THERAPY: Tamoxifen  INTERVAL HISTORY: Dominique Ressel Baker 66 y.o. female returns for follow-up of her left-sided estrogen positive breast cancer.  She is doing moderately well.  She was in [place] over the weekend and noted she was carrying around a purse more so than usual on her left shoulder and since that time has been experiencing increased left breast fullness that is not improving with her usual compression and massage of the area.  She notes that she has a lung nodule and is undergoing CT scans regularly and this is due again in September 2023.  Annaliz's most recent mammogram was completed on January 05, 2022 that showed no evidence of malignancy and breast density category C.   Patient Active Problem List   Diagnosis Date Noted   [id number]  05/28/2022   Paronychia of great toe, right 01/16/2022   Pain of right great toe 01/16/2022   Loose toenail 01/16/2022   Leukopenia 12/17/2021   Exposure to COVID-19 virus 12/17/2021   HTN (hypertension) 06/22/2021   Herpes simplex 06/19/2021   Osteopenia 06/19/2021   Medicare welcome exam 05/21/2021   Malignant neoplasm of upper-inner quadrant of left breast in female, estrogen receptor positive ([place]) 01/16/2021   Chronic migraine 08/21/2020   Screening mammogram, encounter for 06/30/2020   Paresthesia 06/09/2020   Neuropathy 02/14/2020   Atypical chest pain 02/14/2020    Healthcare maintenance 02/14/2020   Migraine    Advance care planning 01/16/2019   Family history of colon cancer     is allergic to ibuprofen, omnicef [cefdinir], proton pump inhibitors, roxicodone [oxycodone hcl], and vioxx [rofecoxib].  MEDICAL HISTORY: Past Medical History:  Diagnosis Date   Arthritis    Breast cancer ([place])    Cataract    Colon polyps    Endometriosis    Family history of colon cancer    Family history of colonic polyps    Fuchs' corneal dystrophy    GERD (gastroesophageal reflux disease)    H/O bilateral breast implants    History of colon polyps    HPV in female    HSV (herpes simplex virus) infection    Migraine    no longer having migraines   Port-A-Cath in place 03/18/2021   S/P TAH (total abdominal hysterectomy) 1991    SURGICAL HISTORY: Past Surgical History:  Procedure Laterality Date   ABDOMINAL HYSTERECTOMY     BREAST BIOPSY     Benign.   BREAST ENHANCEMENT SURGERY     BREAST LUMPECTOMY Bilateral 1993 and 1995   BREAST LUMPECTOMY WITH RADIOACTIVE SEED AND SENTINEL LYMPH NODE BIOPSY Left 02/13/2021   Procedure: LEFT BREAST LUMPECTOMY WITH RADIOACTIVE SEED AND SENTINEL LYMPH NODE BIOPSY WITH SEED TARGETED NODE;  Surgeon: Stark Klein, MD;  Location: [facility];  Service: General;  Laterality: Left;   CATARACT EXTRACTION, BILATERAL     CESAREAN SECTION     PARTIAL HYSTERECTOMY     PORT-A-CATH REMOVAL Right 11/04/2021   Procedure: REMOVAL PORT-A-CATH;  Surgeon: Stark Klein, MD;  Location: [facility];  Service: General;  Laterality: Right;   PORTACATH PLACEMENT Right 02/13/2021   Procedure: INSERTION PORT-A-CATH;  Surgeon: Stark Klein, MD;  Location: [facility];  Service: General;  Laterality: Right;   TONSILLECTOMY     WISDOM TOOTH EXTRACTION      SOCIAL HISTORY: Social History   Socioeconomic History   Marital status: Married    Spouse name: Not on file   Number of children: 2   Years of education: some  college   Highest education level: Not on file  Occupational History   Occupation: Accountant  Tobacco Use   Smoking status: Former    Packs/day: 0.50    Years: 10.00    Total pack years: 5.00    Types: Cigarettes    Quit date: 12/14/1993    Years since quitting: 28.5   Smokeless tobacco: Never  Vaping Use   Vaping Use: Never used  Substance and Sexual Activity   Alcohol use: Yes    Comment: very rare   Drug use: No   Sexual activity: Yes    Comment: hyst  Other Topics Concern   Not on file  Social History Narrative   Lives at home with husband.   Married 1988.   Right-handed.   Social Determinants of  Health   Financial Resource Strain: Low Risk  (05/21/2022)   Overall Financial Resource Strain (CARDIA)    Difficulty of Paying Living Expenses: Not hard at all  Food Insecurity: No Food Insecurity (05/21/2022)   Hunger Vital Sign    Worried About Running Out of Food in the Last Year: Never true    Ran Out of Food in the Last Year: Never true  Transportation Needs: No Transportation Needs (05/21/2022)   PRAPARE - Hydrologist (Medical): No    Lack of Transportation (Non-Medical): No  Physical Activity: Sufficiently Active (05/21/2022)   Exercise Vital Sign    Days of Exercise per Week: 3 days    Minutes of Exercise per Session: 60 min  Stress: No Stress Concern Present (05/21/2022)   [place]    Feeling of Stress : Not at all  Social Connections: Moderately Integrated (05/21/2022)   Social Connection and Isolation Panel [NHANES]    Frequency of Communication with Friends and Family: More than three times a week    Frequency of Social Gatherings with Friends and Family: Once a week    Attends Religious Services: Never    Marine scientist or Organizations: Yes    Attends Music therapist: More than 4 times per year    Marital Status: Married  Human resources officer  Violence: Not At Risk (05/21/2022)   Humiliation, Afraid, Rape, and Kick questionnaire    Fear of Current or Ex-Partner: No    Emotionally Abused: No    Physically Abused: No    Sexually Abused: No    FAMILY HISTORY: Family History  Problem Relation Age of Onset   Dementia Maternal Grandmother    Heart disease Maternal Grandfather    Colon cancer Maternal Grandfather        dx over 21   Colon cancer Father        d. 50   Heart disease Mother    Colon cancer Mother        dx late 16s-70s   Rectal cancer Mother 40   Diabetes Brother    Colon polyps Brother    Diabetes Brother    Colon polyps Brother    Lymphoma Maternal Aunt    Heart disease Maternal Uncle    Crohn's disease Daughter    Esophageal cancer Neg Hx    Stomach cancer Neg Hx    Breast cancer Neg Hx     Review of Systems  Constitutional:  Negative for appetite change, chills, fatigue, fever and unexpected weight change.  HENT:   Negative for hearing loss, lump/mass and trouble swallowing.   Eyes:  Negative for eye problems and icterus.  Respiratory:  Negative for chest tightness, cough and shortness of breath.   Cardiovascular:  Negative for chest pain, leg swelling and palpitations.  Gastrointestinal:  Negative for abdominal distention, abdominal pain, constipation, diarrhea, nausea and vomiting.  Endocrine: Negative for hot flashes.  Genitourinary:  Negative for difficulty urinating.   Musculoskeletal:  Negative for arthralgias.  Skin:  Negative for itching and rash.  Neurological:  Negative for dizziness, extremity weakness, headaches and numbness.  Hematological:  Negative for adenopathy. Does not bruise/bleed easily.  Psychiatric/Behavioral:  Negative for depression. The patient is not nervous/anxious.       PHYSICAL EXAMINATION  ECOG PERFORMANCE STATUS: 1 - Symptomatic but completely ambulatory  Vitals:   06/17/22 0942  BP: 140/79  Pulse: 79  Resp: 18  Temp: (!) 97.5 F (36.4 C)  SpO2: 100%     Physical Exam Constitutional:      General: She is not in acute distress.    Appearance: Normal appearance. She is not toxic-appearing.  HENT:     Head: Normocephalic and atraumatic.  Eyes:     General: No scleral icterus. Cardiovascular:     Rate and Rhythm: Normal rate and regular rhythm.     Pulses: Normal pulses.     Heart sounds: Normal heart sounds.  Pulmonary:     Effort: Pulmonary effort is normal.     Breath sounds: Normal breath sounds.  Chest:     Comments: Left breast is positive for fullness and swelling.  There are no discernible masses however she does noticeably have a breast implant. Abdominal:     General: Abdomen is flat. Bowel sounds are normal. There is no distension.     Palpations: Abdomen is soft.     Tenderness: There is no abdominal tenderness.  Musculoskeletal:        General: No swelling.     Cervical back: Neck supple.  Lymphadenopathy:     Cervical: No cervical adenopathy.  Skin:    General: Skin is warm and dry.     Findings: No rash.  Neurological:     General: No focal deficit present.     Mental Status: She is alert.  Psychiatric:        Mood and Affect: Mood normal.        Behavior: Behavior normal.     LABORATORY DATA:  CBC    Component Value Date/Time   WBC 4.8 06/17/2022 0909   WBC 5.9 02/12/2020 1704   RBC 4.56 06/17/2022 0909   HGB 13.9 06/17/2022 0909   HCT 40.4 06/17/2022 0909   PLT 205 06/17/2022 0909   MCV 88.6 06/17/2022 0909   MCH 30.5 06/17/2022 0909   MCHC 34.4 06/17/2022 0909   RDW 12.7 06/17/2022 0909   LYMPHSABS 1.8 06/17/2022 0909   MONOABS 0.6 06/17/2022 0909   EOSABS 0.1 06/17/2022 0909   BASOSABS 0.0 06/17/2022 0909    CMP     Component Value Date/Time   NA 143 06/17/2022 0909   K 4.1 06/17/2022 0909   CL 109 06/17/2022 0909   CO2 29 06/17/2022 0909   GLUCOSE 58 (L) 06/17/2022 0909   BUN 14 06/17/2022 0909   CREATININE 0.83 06/17/2022 0909   CALCIUM 9.6 06/17/2022 0909   PROT 6.9  06/17/2022 0909   PROT 6.8 08/21/2020 1121   ALBUMIN 4.3 06/17/2022 0909   AST 24 06/17/2022 0909   ALT 20 06/17/2022 0909   ALKPHOS 90 06/17/2022 0909   BILITOT 0.5 06/17/2022 0909   GFRNONAA >60 06/17/2022 0909      ASSESSMENT and THERAPY PLAN:   Malignant neoplasm of upper-inner quadrant of left breast in female, estrogen receptor positive ([facility]) Aimee Mcclain is a 66 year old woman with history of stage Ia triple positive breast cancer status post lumpectomy, adjuvant chemotherapy, maintenance trastuzumab, adjuvant radiation therapy, and antiestrogen therapy with tamoxifen daily.  Elinor is tolerating tamoxifen moderately well and she will continue this.  Because of the left breast swelling I have recommended that she resume her exercises as she has been and I placed orders for mammogram and ultrasound of the left breast to be completed at [facility] in the next week.  If that is negative and she is still experiencing swelling then we can place a referral to physical therapy.  She is  also due for CT chest without contrast as we are following a lung nodule.  I have ordered this for September 2023.  Maye will return in October 2023 for follow-up with Dr. Burr Medico.  All questions were answered. The patient knows to call the clinic with any problems, questions or concerns. We can certainly see the patient much sooner if necessary.  Total encounter time:20 minutes*in face-to-face visit time, chart review, lab review, care coordination, order entry, and documentation of the encounter time.    Wilber Bihari, NP 06/17/22 12:20 PM Medical Oncology and Hematology [facility] [place], [place] [address] Tel. [phone]    Fax. [phone]  *Total Encounter Time as defined by the Centers for Medicare and Medicaid Services includes, in addition to the face-to-face time of a patient visit (documented in the note above) non-face-to-face time: obtaining and reviewing outside  history, ordering and reviewing medications, tests or procedures, care coordination (communications with other health care professionals or caregivers) and documentation in the medical record. Breast cancer status post lumpectomy, adjuvant chemotherapy, maintenance trastuzumab, adjuvant radiation, and currently taking antiestrogen therapy with tamoxifen.

## 2022-06-23 ENCOUNTER — Encounter: Payer: Self-pay | Admitting: Hematology

## 2022-06-24 DIAGNOSIS — N809 Endometriosis, unspecified: Secondary | ICD-10-CM | POA: Insufficient documentation

## 2022-06-24 DIAGNOSIS — K760 Fatty (change of) liver, not elsewhere classified: Secondary | ICD-10-CM | POA: Insufficient documentation

## 2022-06-26 ENCOUNTER — Encounter: Payer: Self-pay | Admitting: Family Medicine

## 2022-07-02 ENCOUNTER — Encounter: Payer: Self-pay | Admitting: Internal Medicine

## 2022-07-02 ENCOUNTER — Ambulatory Visit (INDEPENDENT_AMBULATORY_CARE_PROVIDER_SITE_OTHER): Payer: Medicare Other | Admitting: Internal Medicine

## 2022-07-02 VITALS — BP 130/80 | HR 68 | Ht 67.0 in | Wt 193.0 lb

## 2022-07-02 DIAGNOSIS — R9431 Abnormal electrocardiogram [ECG] [EKG]: Secondary | ICD-10-CM | POA: Diagnosis not present

## 2022-07-02 DIAGNOSIS — R0609 Other forms of dyspnea: Secondary | ICD-10-CM | POA: Diagnosis not present

## 2022-07-02 MED ORDER — METOPROLOL TARTRATE 50 MG PO TABS: 50.0000 mg | ORAL_TABLET | Freq: Once | ORAL | 0 refills | Status: DC

## 2022-07-02 NOTE — Progress Notes (Signed)
Cardiology Office Note:    Date:  07/02/2022   ID:  Aimee Mcclain, DOB 1956/11/08, MRN [id number]  PCP:  [name], MD   [facility] Cardiologist:  Janina Mayo, MD     Referring MD: Wilber Bihari Cornett*   No chief complaint on file. Mild Dyspnea/CAC  History of Present Illness:    Aimee Mcclain is a 66 y.o. female with a hx of breast cancer details below, here with dyspnea  She notes that when she exerts herself in the gym she can feel like she could pass out and dizzy. With daily activity she feels on fine. She can carry groceries and go up stairs without significant DOE or chest pressure. She does have mild dyspnea.  She had some leg pain and she feels better exemestane and this was changed to tamoxifen.   She has no cardiac disease hx. Mother has afib. Her maternal grandfather and uncle had heart disease. Her father had MI at 97.  She smoked for 10 years  Her CT showed mild CAC.  The 10-year ASCVD risk score (Arnett DK, et al., 2019) is: 8.3%   Values used to calculate the score:     Age: 62 years     Sex: Female     Is Non-Hispanic African American: No     Diabetic: No     Tobacco smoker: No     Systolic Blood Pressure: 026 mmHg     Is BP treated: Yes     HDL Cholesterol: 51.2 mg/dL     Total Cholesterol: 179 mg/dL   Breast Cancer Hx: 01/13/2021- Stage1B; ER/PR+; HER2+. Invasive ductal carcinoma. Upper quadrant of the L breast. 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 Strain -21. TTE 08/06/2021- normal fxn, no strain TTE 11/13/2021- GLS -19 TTE 03/20/2022- GLS - 22  Past Medical History:  Diagnosis  Date   Arthritis    Breast cancer ([facility])    Cataract    Colon polyps    Endometriosis    Family history of colon cancer    Family history of colonic polyps    Fuchs' corneal dystrophy    GERD (gastroesophageal reflux disease)    H/O bilateral breast implants    History of colon polyps    HPV in female    HSV (herpes simplex virus) infection    Migraine    no longer having migraines   Port-A-Cath in place 03/18/2021   S/P TAH (total abdominal hysterectomy) 1991    Past Surgical History:  Procedure Laterality Date   ABDOMINAL HYSTERECTOMY     BREAST BIOPSY     Benign.   BREAST ENHANCEMENT SURGERY     BREAST LUMPECTOMY Bilateral 1993 and 1995   BREAST LUMPECTOMY WITH RADIOACTIVE SEED AND SENTINEL LYMPH NODE BIOPSY Left 02/13/2021   Procedure: LEFT BREAST LUMPECTOMY WITH RADIOACTIVE SEED AND SENTINEL LYMPH NODE BIOPSY WITH SEED TARGETED NODE;  Surgeon: Stark Klein, MD;  Location: [facility];  Service: General;  Laterality: Left;   CATARACT EXTRACTION, BILATERAL     CESAREAN SECTION     PARTIAL HYSTERECTOMY     PORT-A-CATH REMOVAL Right 11/04/2021   Procedure: REMOVAL PORT-A-CATH;  Surgeon:  Stark Klein, MD;  Location: [facility];  Service: General;  Laterality: Right;   PORTACATH PLACEMENT Right 02/13/2021   Procedure: INSERTION PORT-A-CATH;  Surgeon: Stark Klein, MD;  Location: [facility];  Service: General;  Laterality: Right;   TONSILLECTOMY     WISDOM TOOTH EXTRACTION      Current Medications: Current Meds  Medication Sig   acetaminophen (TYLENOL) 500 MG tablet Take 1,000 mg by mouth every 6 (six) hours as needed for moderate pain.   Calcium Carbonate-Vitamin D (CALCIUM 600+D PO) Take 1 tablet by mouth daily.   metoprolol tartrate (LOPRESSOR) 50 MG tablet Take 1 tablet (50 mg total) by mouth once for 1 dose. PLEASE TAKE METOPROLOL 2  HOURS PRIOR TO CTA SCAN.   Multiple Vitamin (MULTIVITAMIN) tablet Take 1 tablet by mouth daily.   omeprazole  (PRILOSEC) 20 MG capsule Take 20 mg by mouth daily.   tamoxifen (NOLVADEX) 20 MG tablet Take 1 tablet (20 mg total) by mouth daily.   traMADol (ULTRAM) 50 MG tablet Take 1 tablet (50 mg total) by mouth every 6 (six) hours as needed.   valACYclovir (VALTREX) 500 MG tablet Take 1 tablet (500 mg total) by mouth 2 (two) times daily as needed.   Vitamin B Complex-C CAPS Take 1 capsule by mouth.     Allergies:   Ibuprofen, Omnicef [cefdinir], Proton pump inhibitors, Roxicodone [oxycodone hcl], and Vioxx [rofecoxib]   Social History   Socioeconomic History   Marital status: Married    Spouse name: Not on file   Number of children: 2   Years of education: some college   Highest education level: Not on file  Occupational History   Occupation: Accountant  Tobacco Use   Smoking status: Former    Packs/day: 0.50    Years: 10.00    Total pack years: 5.00    Types: Cigarettes    Quit date: 12/14/1993    Years since quitting: 28.5   Smokeless tobacco: Never  Vaping Use   Vaping Use: Never used  Substance and Sexual Activity   Alcohol use: Yes    Comment: very rare   Drug use: No   Sexual activity: Yes    Comment: hyst  Other Topics Concern   Not on file  Social History Narrative   Lives at home with husband.   Married 1988.   Right-handed.   Social Determinants of Health   Financial Resource Strain: Low Risk  (05/21/2022)   Overall Financial Resource Strain (CARDIA)    Difficulty of Paying Living Expenses: Not hard at all  Food Insecurity: No Food Insecurity (05/21/2022)   Hunger Vital Sign    Worried About Running Out of Food in the Last Year: Never true    Ran Out of Food in the Last Year: Never true  Transportation Needs: No Transportation Needs (05/21/2022)   PRAPARE - Hydrologist (Medical): No    Lack of Transportation (Non-Medical): No  Physical Activity: Sufficiently Active (05/21/2022)   Exercise Vital Sign    Days of Exercise per Week: 3 days     Minutes of Exercise per Session: 60 min  Stress: No Stress Concern Present (05/21/2022)   [place]    Feeling of Stress : Not at all  Social Connections: Moderately Integrated (05/21/2022)   Social Connection and Isolation Panel [NHANES]    Frequency of Communication with Friends and Family: More than three times a week  Frequency of Social Gatherings with Friends and Family: Once a week    Attends Religious Services: Never    Marine scientist or Organizations: Yes    Attends Music therapist: More than 4 times per year    Marital Status: Married     Family History: The patient's family history includes Colon cancer in her father, maternal grandfather, and mother; Colon polyps in her brother and brother; Crohn's disease in her daughter; Dementia in her maternal grandmother; Diabetes in her brother and brother; Heart disease in her maternal grandfather, maternal uncle, and mother; Lymphoma in her maternal aunt; Rectal cancer (age of onset: 37) in her mother. There is no history of Esophageal cancer, Stomach cancer, or Breast cancer.  ROS:   Please see the history of present illness.     All other systems reviewed and are negative.  EKGs/Labs/Other Studies Reviewed:    The following studies were reviewed today:   EKG:  EKG is  ordered today.  The ekg ordered today demonstrates   07/02/2021- NSR, IRRRB  Recent Labs: 06/17/2022: ALT 20; BUN 14; Creatinine 0.83; Hemoglobin 13.9; Platelet Count 205; Potassium 4.1; Sodium 143  Recent Lipid Panel    Component Value Date/Time   CHOL 179 02/12/2020 1704   TRIG 87.0 02/12/2020 1704   HDL 51.20 02/12/2020 1704   CHOLHDL 4 02/12/2020 1704   VLDL 17.4 02/12/2020 1704   LDLCALC 111 (H) 02/12/2020 1704     Risk Assessment/Calculations:           Physical Exam:    VS:  BP 130/80   Pulse 68   Ht _0  (1.702 m)   Wt 193 lb (87.5 kg)   SpO2 96%    BMI 30.23 kg/m     Wt Readings from Last 3 Encounters:  07/02/22 193 lb (87.5 kg)  06/17/22 191 lb 11.2 oz (87 kg)  06/08/22 191 lb 6 oz (86.8 kg)     GEN:  Well nourished, well developed in no acute distress HEENT: Normal NECK: No JVD; No carotid bruits LYMPHATICS: No lymphadenopathy CARDIAC: RRR, no murmurs, rubs, gallops RESPIRATORY:  Clear to auscultation without rales, wheezing or rhonchi  ABDOMEN: Soft, non-tender, non-distended MUSCULOSKELETAL:  No edema; No deformity  SKIN: Warm and dry NEUROLOGIC:  Alert and oriented x 3 PSYCHIATRIC:  Normal affect   ASSESSMENT:   Mild Dyspnea: thankfully all of her echoes and strain have been normal during her herceptin therapy. She has no CVD hx. With mild symptoms and CAC will obtain coronary CTA to quantify her plaque and determine necessity for prevention with statin therapy.  Breast Cancer Survivor: No increased CVD risk with tamoxifen. She's had seed radiation for LUQ breast CA; unknown mean heart dose but likely low.  PLAN:    In order of problems listed above:  Coronary CTA Follow up 6 months           Medication Adjustments/Labs and Tests Ordered: Current medicines are reviewed at length with the patient today.  Concerns regarding medicines are outlined above.  Orders Placed This Encounter  Procedures   CT CORONARY MORPH W/CTA COR W/SCORE W/CA W/CM &/OR WO/CM   EKG 12-Lead   Meds ordered this encounter  Medications   metoprolol tartrate (LOPRESSOR) 50 MG tablet    Sig: Take 1 tablet (50 mg total) by mouth once for 1 dose. PLEASE TAKE METOPROLOL 2  HOURS PRIOR TO CTA SCAN.    Dispense:  1 tablet    Refill:  0    Patient Instructions  Medication Instructions:  PLEASE TAKE METOPROLOL TARTRATE 24m TWO HOUR PRIOR TO CCTA SCAN  *If you need a refill on your cardiac medications before your next appointment, please call your pharmacy*  Lab Work: None Ordered At This Time.  If you have labs (blood work) drawn  today and your tests are completely normal, you will receive your results only by: [facility](if you have MyChart) OR A paper copy in the mail If you have any lab test that is abnormal or we need to change your treatment, we will call you to review the results.  Testing/Procedures: Your physician has requested that you have cardiac CT. Cardiac computed tomography (CT) is a painless test that uses an x-ray machine to take clear, detailed pictures of your heart. For further information please visit [email or web address] Please follow instruction sheet as given.  Follow-Up: [facility] and your health needs are our priority.  As part of our continuing mission to provide you with exceptional heart care, we have created designated Provider Care Teams.  These Care Teams include your primary Cardiologist (physician) and Advanced Practice Providers (APPs -  Physician Assistants and Nurse Practitioners) who all work together to provide you with the care you need, when you need it.  Your next appointment:   6 month(s)  The format for your next appointment:   In Person  Provider:   BJanina Mayo MD    Other Instructions   Your cardiac CT will be scheduled at one of the below locations:   [facility] [place] [phone] [facility] minutes prior to test start time. You can use the FREE valet parking offered at entrance C (encouraged to control the heart rate for the test)  [facility] (first floor) to check-in and test prep.  All radiology patients and guests should use entrance C2 [facility] even though the hospital's physical address listed is [address]    Please follow these instructions carefully (unless  otherwise directed):  On the Night Before the Test: Be sure to Drink plenty of water. Do not consume any caffeinated/decaffeinated beverages or chocolate 12 hours prior to your test. Do not take any antihistamines 12 hours prior to your test.  On the Day of the Test: Drink plenty of water until 1 hour prior to the test. Do not eat any food 4 hours prior to the test. You may take your regular medications prior to the test.  Take metoprolol (Lopressor) two hours prior to test. HOLD Furosemide/Hydrochlorothiazide morning of the test. FEMALES- please wear underwire-free bra if available, avoid dresses & tight clothing  After the Test: Drink plenty of water. After receiving IV contrast, you may experience a mild flushed feeling. This is normal. On occasion, you may experience a mild rash up to 24 hours after the test. This is not dangerous. If this occurs, you can take Benadryl 25 mg and increase your fluid intake. If you experience trouble breathing, this can be serious. If it is severe call 911 IMMEDIATELY. If it is mild, please call our office. If you take any of these medications: Glipizide/Metformin, Avandament, Glucavance, please do not take 48 hours after completing test unless otherwise instructed.  We will call to schedule  your test 2-4 weeks out understanding that some insurance companies will need an authorization prior to the service being performed.   For non-scheduling related questions, please contact the cardiac imaging nurse navigator should you have any questions/concerns: Marchia Bond, Cardiac Imaging Nurse Navigator Gordy Clement, Cardiac Imaging Nurse Navigator [facility] Direct Office Dial: [phone]   For scheduling needs, including cancellations and rescheduling, please call [place], [phone].           Signed, Janina Mayo, MD  07/02/2022 2:23 PM    [place]

## 2022-07-02 NOTE — Patient Instructions (Signed)
Medication Instructions:  PLEASE TAKE METOPROLOL TARTRATE '50mg'$  TWO HOUR PRIOR TO CCTA SCAN  *If you need a refill on your cardiac medications before your next appointment, please call your pharmacy*  Lab Work: None Ordered At This Time.  If you have labs (blood work) drawn today and your tests are completely normal, you will receive your results only by: [employer] (if you have MyChart) OR A paper copy in the mail If you have any lab test that is abnormal or we need to change your treatment, we will call you to review the results.  Testing/Procedures: Your physician has requested that you have cardiac CT. Cardiac computed tomography (CT) is a painless test that uses an x-ray machine to take clear, detailed pictures of your heart. For further information please visit HugeFiesta.[email or web address]. Please follow instruction sheet as given.  Follow-Up: At [facility], you and your health needs are our priority.  As part of our continuing mission to provide you with exceptional heart care, we have created designated Provider Care Teams.  These Care Teams include your primary Cardiologist (physician) and Advanced Practice Providers (APPs -  Physician Assistants and Nurse Practitioners) who all work together to provide you with the care you need, when you need it.  Your next appointment:   6 month(s)  The format for your next appointment:   In Person  Provider:   Janina Mayo, MD    Other Instructions   Your cardiac CT will be scheduled at one of the below locations:   [facility] [address] [place], [place] [address] [phone]  If scheduled at [facility], please arrive at the [facility] and Children's Entrance (Entrance C2) of [facility] 30 minutes prior to test start time. You can use the FREE valet parking offered at entrance C (encouraged to control the heart rate for the test)  Proceed to the [facility] Radiology Department (first floor) to  check-in and test prep.  All radiology patients and guests should use entrance C2 at [facility], accessed from [facility], even though the hospital's physical address listed is [address].    Please follow these instructions carefully (unless otherwise directed):  On the Night Before the Test: Be sure to Drink plenty of water. Do not consume any caffeinated/decaffeinated beverages or chocolate 12 hours prior to your test. Do not take any antihistamines 12 hours prior to your test.  On the Day of the Test: Drink plenty of water until 1 hour prior to the test. Do not eat any food 4 hours prior to the test. You may take your regular medications prior to the test.  Take metoprolol (Lopressor) two hours prior to test. HOLD Furosemide/Hydrochlorothiazide morning of the test. FEMALES- please wear underwire-free bra if available, avoid dresses & tight clothing  After the Test: Drink plenty of water. After receiving IV contrast, you may experience a mild flushed feeling. This is normal. On occasion, you may experience a mild rash up to 24 hours after the test. This is not dangerous. If this occurs, you can take Benadryl 25 mg and increase your fluid intake. If you experience trouble breathing, this can be serious. If it is severe call 911 IMMEDIATELY. If it is mild, please call our office. If you take any of these medications: Glipizide/Metformin, Avandament, Glucavance, please do not take 48 hours after completing test unless otherwise instructed.  We will call to schedule your test 2-4 weeks out understanding that some  insurance companies will need an authorization prior to the service being performed.   For non-scheduling related questions, please contact the cardiac imaging nurse navigator should you have any questions/concerns: Marchia Bond, Cardiac Imaging Nurse Navigator Gordy Clement, Cardiac Imaging Nurse Navigator [facility] Direct Office Dial: [phone]   For scheduling needs, including cancellations and rescheduling, please call [place], [phone].

## 2022-07-04 ENCOUNTER — Other Ambulatory Visit: Payer: Self-pay | Admitting: Hematology

## 2022-07-13 ENCOUNTER — Encounter: Payer: Self-pay | Admitting: Adult Health

## 2022-07-14 NOTE — Progress Notes (Signed)
Re-faxed MM and [place] to [facility] @ [phone], fax confirmed.

## 2022-07-21 IMAGING — MR MR BREAST BILAT WO/W CM
8 of 12 series · 32 of 48 positions shown · IV contrast (gadavist)
Comparison: Prior breast imaging from [REDACTED] dated
01/13/2021 and earlier.

CLINICAL DATA: Breast cancer staging. Patient had ultrasound-guided
core biopsy of mass in the 11 o'clock location of the LEFT breast
performed on 01/13/2021 at [REDACTED], demonstrating a
centimeter grade 3 invasive ductal carcinoma. Ultrasound-guided core
biopsy of an enlarged LEFT axillary lymph node was negative but felt
to be discordant as there was no lymphoid tissue present. History of
breast implants in [REDACTED].

LABS:  None obtained at the time of imaging.
EXAM:
BILATERAL BREAST MRI WITH AND WITHOUT CONTRAST
TECHNIQUE: Multiplanar, multisequence MR images of both breasts were obtained
prior to and following the intravenous administration of 9 ml of
Gadavist

[Series 2: t2_tirm_tra ipat (a-p) · axial · 3.0mm · 0.70mm/px · 1 of 55 slices shown]
[im 1/55]
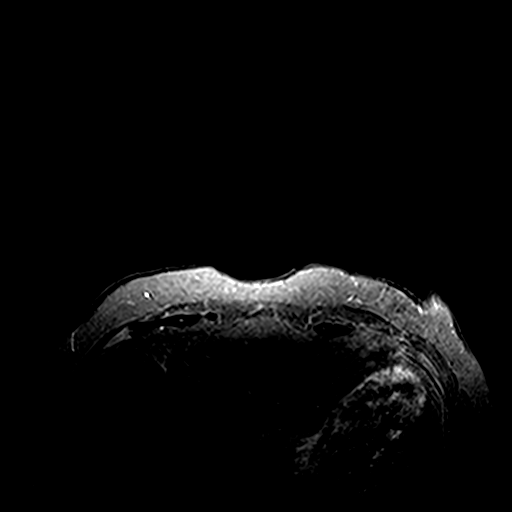

[Series 3: fl3d pre-cm no · axial · non-contrast · 1.2mm · 0.94mm/px · z∈[-59,+113]mm · 5 of 144 slices shown]
[im 1/144]
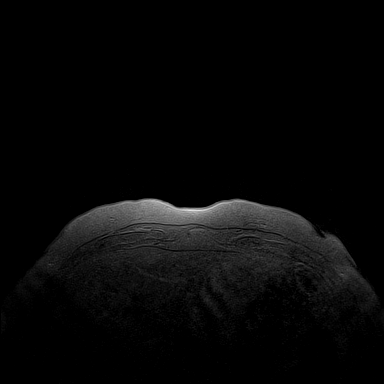
[im 36/144]
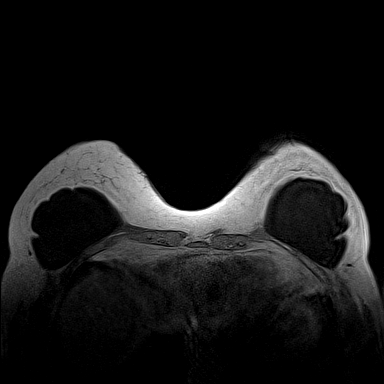
[im 72/144]
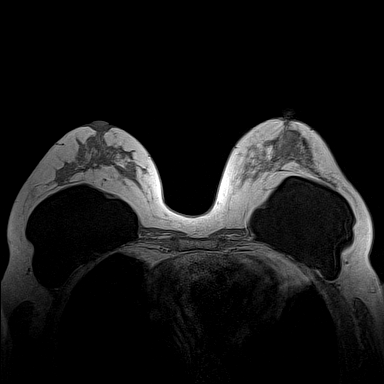
[im 108/144]
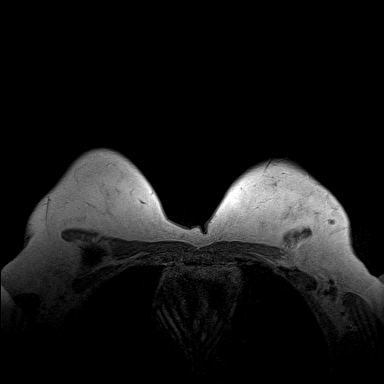
[im 144/144]
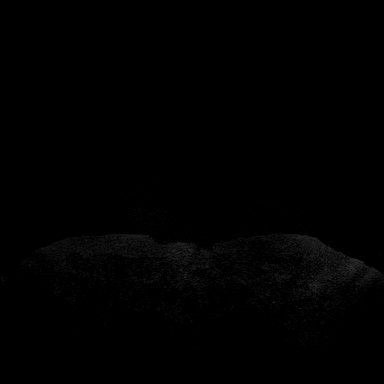

[Series 4: fl3d pre-cm · axial · non-contrast · 1.2mm · 0.94mm/px · z∈[-59,+113]mm · 5 of 144 slices shown]
[im 1/144]
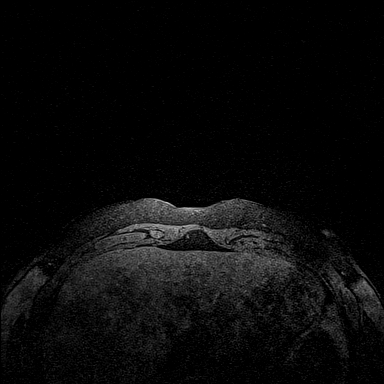
[im 36/144]
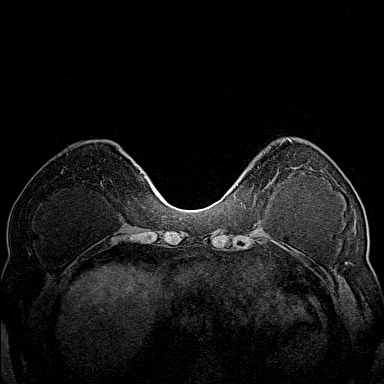
[im 72/144]
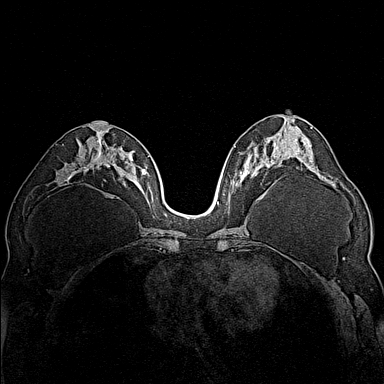
[im 108/144]
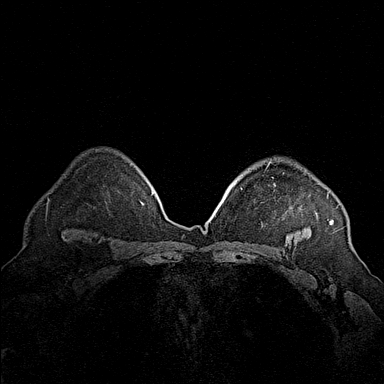
[im 144/144]
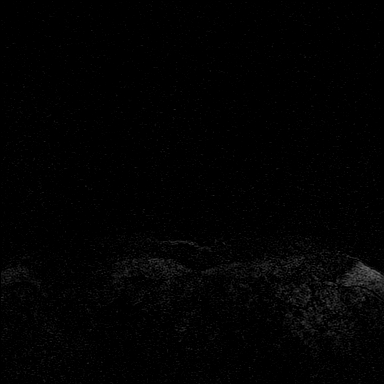

[Series 5: fl3d post-cm 20 · axial · 1.2mm · 0.94mm/px · z∈[-59,+113]mm · 5 of 144 slices shown (1 of 3)]
[im 1/144]
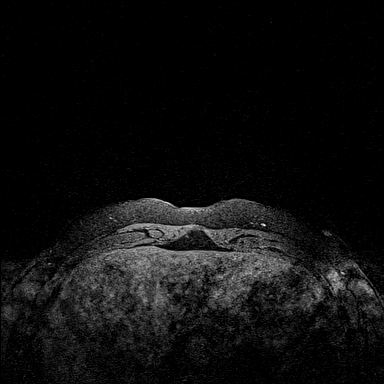
[im 36/144]
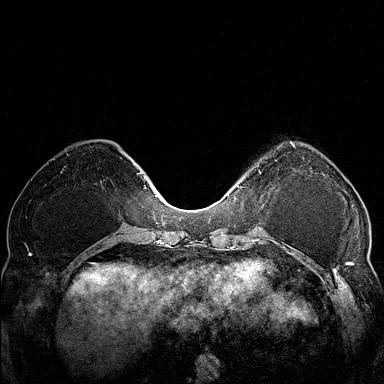
[im 72/144]
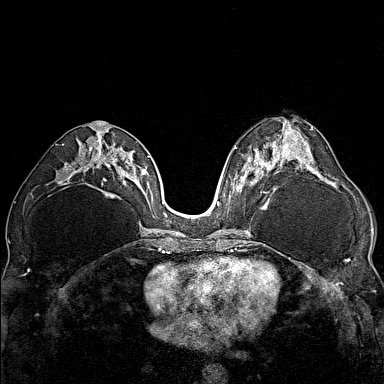
[im 108/144]
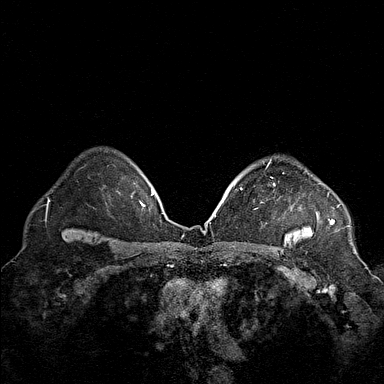
[im 144/144]
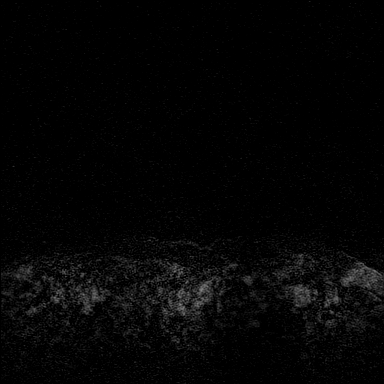

[Series 6: fl3d post-cm 20 · axial · 1.2mm · 0.94mm/px · z∈[-59,+113]mm · 5 of 144 slices shown (2 of 3)]
[im 1/144]
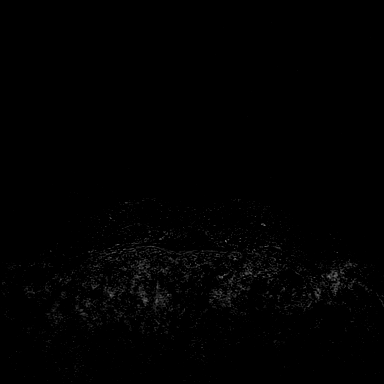
[im 36/144]
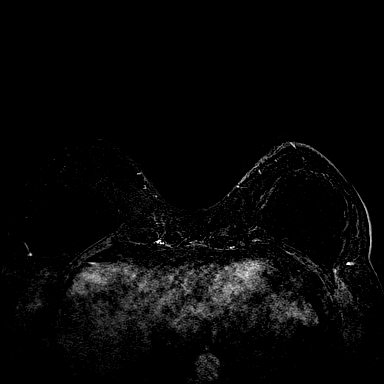
[im 72/144]
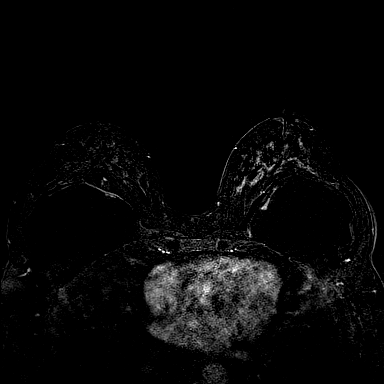
[im 108/144]
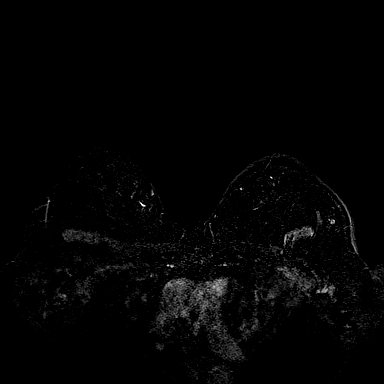
[im 144/144]
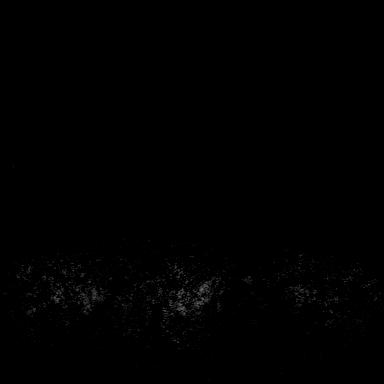

[Series 7: fl3d post-cm 20 · axial · 172.8mm · 0.94mm/px · 1 of 1 slices shown (3 of 3)]
[im 1/1]
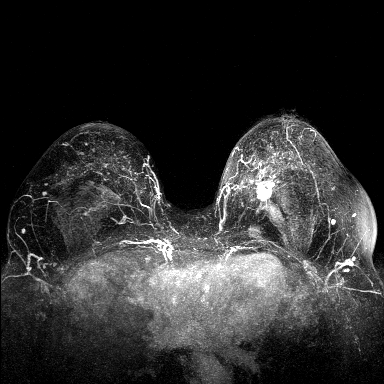

[Series 8: fl3d post-cm 3min · axial · 1.2mm · 0.94mm/px · z∈[-59,+113]mm · 6 of 144 slices shown]
[im 1/144]
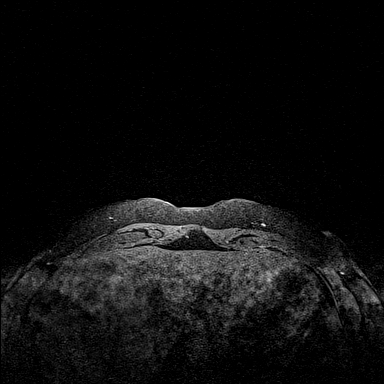
[im 29/144]
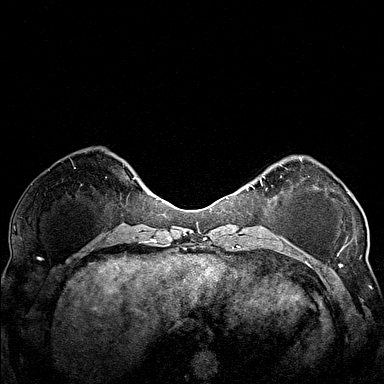
[im 58/144]
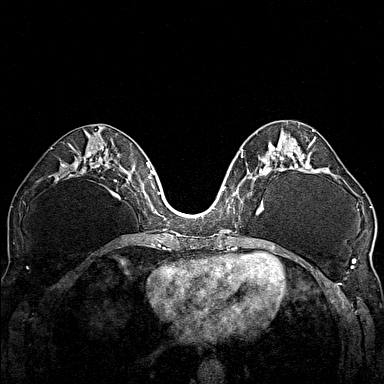
[im 86/144]
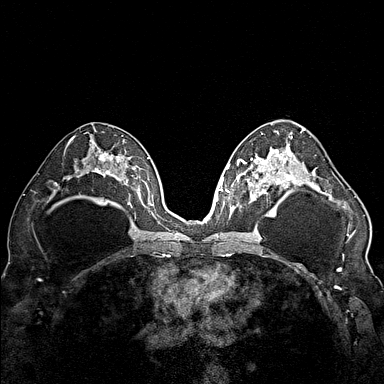
[im 115/144]
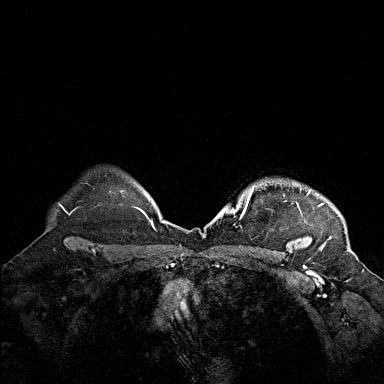
[im 144/144]
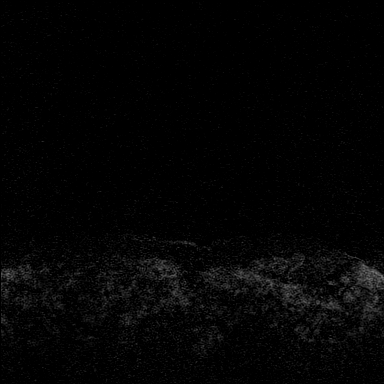

[Series 9: fl3d post-cm 3min_sub · axial · 1.2mm · 0.94mm/px · z∈[-59,+43]mm · 4 of 144 slices shown]
[im 1/144]
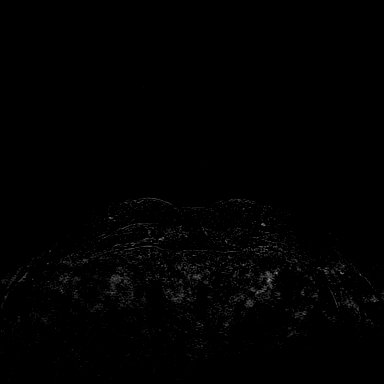
[im 29/144]
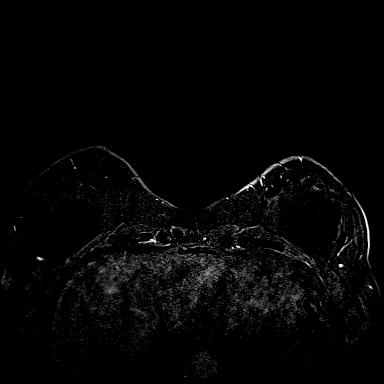
[im 58/144]
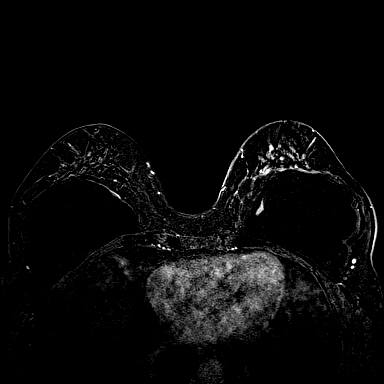
[im 86/144]
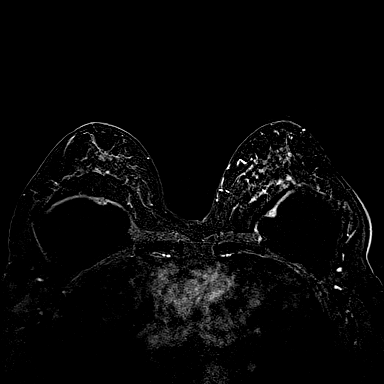

[32 of 48 positions shown; findings below may reference images not displayed]

Three-dimensional MR images were rendered by post-processing of the
original MR data on an independent workstation. The
three-dimensional MR images were interpreted, and findings are
reported in the following complete MRI report for this study. Three
dimensional images were evaluated at the independent interpreting
workstation using the DynaCAD thin client.
FINDINGS: Breast composition: c. Heterogeneous fibroglandular tissue.

Background parenchymal enhancement: Moderate.

Right breast: No mass or abnormal enhancement. Patient has a
retropectoral saline implant.

Left breast: Patient has a retropectoral saline implant. Within the
UPPER INNER QUADRANT of the LEFT breast, at middle depth, there is
an enhancing oval mass with irregular margins containing tissue
marker clip artifact. (Image 52 of series 12). Mass measures 2.9 x
1.9 x 2.0 centimeters. Enhancement shows washout type kinetics.
Despite the posterior appearance on the mammogram and ultrasound,
there is no evidence for involvement of the pectoralis, fibrous
capsule, or implant envelope.

Lymph nodes: No abnormal appearing lymph nodes. A HydroMARK tissue
marker clip is identified adjacent to normal appearing LEFT axillary
lymph nodes. (Image 12 of series 2).

Ancillary findings:  None.
IMPRESSION: 1. Known malignancy measuring 2.9 centimeters in the UPPER INNER
QUADRANT of the LEFT breast. There is no evidence for involvement of
the pectoralis muscle or implant.
2. No evidence for lymphadenopathy.
3. RIGHT breast is negative.
4. Bilateral retropectoral saline implants.

RECOMMENDATION:
Treatment plan for known LEFT breast malignancy.

BI-RADS CATEGORY  6: Known biopsy-proven malignancy.

## 2022-07-27 ENCOUNTER — Encounter (HOSPITAL_COMMUNITY): Payer: Self-pay

## 2022-07-27 ENCOUNTER — Telehealth (HOSPITAL_COMMUNITY): Payer: Self-pay | Admitting: *Deleted

## 2022-07-27 NOTE — Telephone Encounter (Signed)
Reaching out to patient to offer assistance regarding upcoming cardiac imaging study; pt verbalizes understanding of appt date/time, parking situation and where to check in, pre-test NPO status and medications ordered, and verified current allergies; name and call back number provided for further questions should they arise ? ?Ryelynn Guedea RN Navigator Cardiac Imaging ?[facility] ?[phone] office ?[phone] cell ? ?Patient to take 50mg metoprolol tartrate two hours prior to her cardiac CT scan. She is aware to arrive at 8:30am.  ?

## 2022-07-29 ENCOUNTER — Ambulatory Visit (HOSPITAL_COMMUNITY)
Admission: RE | Admit: 2022-07-29 | Discharge: 2022-07-29 | Disposition: A | Discharge status: Home / Self Care | Payer: Medicare Other | Source: Ambulatory Visit | Attending: Internal Medicine | Admitting: Internal Medicine

## 2022-07-29 DIAGNOSIS — R9431 Abnormal electrocardiogram [ECG] [EKG]: Secondary | ICD-10-CM | POA: Insufficient documentation

## 2022-07-29 DIAGNOSIS — R0609 Other forms of dyspnea: Secondary | ICD-10-CM | POA: Diagnosis present

## 2022-07-29 MED ORDER — NITROGLYCERIN 0.4 MG SL SUBL
SUBLINGUAL_TABLET | SUBLINGUAL | Status: AC
  Filled 2022-07-29: qty 2

## 2022-07-29 MED ORDER — IOHEXOL 350 MG/ML SOLN
100.0000 mL | Freq: Once | INTRAVENOUS | Status: AC | PRN
  Administered 2022-07-29: 100 mL via INTRAVENOUS

## 2022-07-29 MED ORDER — NITROGLYCERIN 0.4 MG SL SUBL
0.8000 mg | SUBLINGUAL_TABLET | Freq: Once | SUBLINGUAL | Status: AC
  Administered 2022-07-29: 0.8 mg via SUBLINGUAL

## 2022-07-31 ENCOUNTER — Encounter: Payer: Self-pay | Admitting: Internal Medicine

## 2022-07-31 ENCOUNTER — Other Ambulatory Visit: Payer: Self-pay

## 2022-07-31 MED ORDER — ATORVASTATIN CALCIUM 20 MG PO TABS: 20.0000 mg | ORAL_TABLET | Freq: Every day | ORAL | 3 refills | Status: DC

## 2022-08-01 ENCOUNTER — Encounter: Payer: Self-pay | Admitting: Hematology

## 2022-08-05 DIAGNOSIS — Z0289 Encounter for other administrative examinations: Secondary | ICD-10-CM

## 2022-08-10 ENCOUNTER — Telehealth: Payer: Self-pay

## 2022-08-10 NOTE — Telephone Encounter (Signed)
This nurse reached out to patient and made her aware that her CT Chest has been canceled.  Due to her recent Cardiac CT the provider states that her CT Chest is no longer needed.  Patient acknowledged understanding. No further questions or concerns at this time.

## 2022-08-12 ENCOUNTER — Encounter (INDEPENDENT_AMBULATORY_CARE_PROVIDER_SITE_OTHER): Payer: Self-pay | Admitting: Family Medicine

## 2022-08-12 ENCOUNTER — Ambulatory Visit (INDEPENDENT_AMBULATORY_CARE_PROVIDER_SITE_OTHER): Payer: Medicare Other | Admitting: Family Medicine

## 2022-08-12 VITALS — BP 156/80 | HR 70 | Temp 97.6°F | Ht 67.0 in | Wt 191.0 lb

## 2022-08-12 DIAGNOSIS — E162 Hypoglycemia, unspecified: Secondary | ICD-10-CM

## 2022-08-12 DIAGNOSIS — I2584 Coronary atherosclerosis due to calcified coronary lesion: Secondary | ICD-10-CM | POA: Diagnosis not present

## 2022-08-12 DIAGNOSIS — E669 Obesity, unspecified: Secondary | ICD-10-CM

## 2022-08-12 DIAGNOSIS — R5383 Other fatigue: Secondary | ICD-10-CM | POA: Diagnosis not present

## 2022-08-12 DIAGNOSIS — I10 Essential (primary) hypertension: Secondary | ICD-10-CM | POA: Diagnosis not present

## 2022-08-12 DIAGNOSIS — R0602 Shortness of breath: Secondary | ICD-10-CM

## 2022-08-12 DIAGNOSIS — E559 Vitamin D deficiency, unspecified: Secondary | ICD-10-CM

## 2022-08-12 DIAGNOSIS — I2510 Atherosclerotic heart disease of native coronary artery without angina pectoris: Secondary | ICD-10-CM

## 2022-08-12 DIAGNOSIS — Z6830 Body mass index (BMI) 30.0-30.9, adult: Secondary | ICD-10-CM

## 2022-08-12 DIAGNOSIS — Z1331 Encounter for screening for depression: Secondary | ICD-10-CM | POA: Diagnosis not present

## 2022-08-13 ENCOUNTER — Ambulatory Visit (HOSPITAL_COMMUNITY): Payer: Medicare Other

## 2022-08-13 LAB — LIPID PANEL WITH LDL/HDL RATIO
Cholesterol, Total: 209 mg/dL — ABNORMAL HIGH (ref 100–199)
HDL: 62 mg/dL (ref >39)
LDL Chol Calc (NIH): 132 mg/dL — ABNORMAL HIGH (ref 0–99)
LDL/HDL Ratio: 2.1 ratio (ref 0.0–3.2)
Triglycerides: 83 mg/dL (ref 0–149)
VLDL Cholesterol Cal: 15 mg/dL (ref 5–40)

## 2022-08-13 LAB — VITAMIN D 25 HYDROXY (VIT D DEFICIENCY, FRACTURES): Vit D, 25-Hydroxy: 40 ng/mL (ref 30.0–100.0)

## 2022-08-13 LAB — INSULIN, RANDOM: INSULIN: 13 u[IU]/mL (ref 2.6–24.9)

## 2022-08-13 LAB — T4, FREE: Free T4: 1.11 ng/dL (ref 0.82–1.77)

## 2022-08-13 LAB — HEMOGLOBIN A1C
Est. average glucose Bld gHb Est-mCnc: 105 mg/dL
Hgb A1c MFr Bld: 5.3 % (ref 4.8–5.6)

## 2022-08-13 LAB — T3: T3, Total: 156 ng/dL (ref 71–180)

## 2022-08-13 LAB — TSH: TSH: 1.8 u[IU]/mL (ref 0.450–4.500)

## 2022-08-14 ENCOUNTER — Ambulatory Visit (HOSPITAL_COMMUNITY): Payer: Medicare Other

## 2022-08-19 NOTE — Progress Notes (Signed)
Chief Complaint:   Aimee Mcclain (MR# [id number]) is a 66 y.o. female who presents for evaluation and treatment of Aimee and related comorbidities. Current BMI is Body mass index is 29.91 kg/m. Aimee Mcclain has been struggling with her weight for many years and has been unsuccessful in either losing weight, maintaining weight loss, or reaching her healthy weight goal.  Aimee Mcclain was told about clinic from Dr. Cletis Media. 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 Current hours 9-7. Eats out 5-7 a week. Am-2 cups of coffee with Stevia, 1/2 cup Triple zero yogurt with 1/2 cup of pineapples (satisfied), midmorning, 1 banana or graps. Lunch--Boz seat/[employer]--grilled chicken salad or wrap with fries or broccoli (feel full) with diet soda. Something sweet like grape. Dinner--chicken/2.5 oz with veggies/1 cup. After dinner Activia yogurt.  Aimee Mcclain is currently in the action stage of change and ready to dedicate time achieving and maintaining a healthier weight. Aimee Mcclain is interested in becoming our patient and working on intensive lifestyle modifications including (but not limited to) diet and exercise for weight loss.  Aimee Mcclain's habits were reviewed today and are as follows: Her family eats meals together, she thinks her family will eat healthier with her, her desired weight loss is 32 pounds, she started gaining weight when she quit smoking, her heaviest weight ever was 207 pounds, she snacks frequently in the evenings, she frequently makes poor food choices, she frequently eats larger portions than normal, and she struggles with emotional eating.  Depression Screen Aimee Mcclain's Food and Mood (modified PHQ-9) score was 11.     08/12/2022    7:20 AM  Depression screen PHQ 2/9  Decreased Interest 1  Down, Depressed, Hopeless 1  PHQ - 2 Score 2  Altered sleeping 3  Tired, decreased energy 2  Change in appetite 2  Feeling bad or failure about yourself  1  Trouble concentrating 1   Moving slowly or fidgety/restless 0  Suicidal thoughts 0  PHQ-9 Score 11  Difficult doing work/chores Not difficult at all   Subjective:   1. Other fatigue EKG done in July 2023 showing incomplete RBBB. Aimee Mcclain admits to daytime somnolence and admits to waking up still tired. Patient has a history of symptoms of daytime fatigue. Aimee Mcclain generally gets 4 hours of sleep per night, and states that she has nightime awakenings. Snoring is present. Apneic episodes are not present. Epworth Sleepiness Score is 6.    2. SOBOE (shortness of breath on exertion) Aimee Mcclain Claude notes increasing shortness of breath with exercising and seems to be worsening over time with weight gain. She notes getting out of breath sooner with activity than she used to. This has not gotten worse recently. Aimee Mcclain denies shortness of breath at rest or orthopnea.   3. Essential hypertension Aimee Mcclain's blood pressure labile over the last 2 years. Previously given Lisinopril but never took it.  4. Coronary artery disease due to calcified coronary lesion Leara is on Atorvastain. Last LDL 111, HDL 51, Trigly 87 in 2021.  5. Vitamin D deficiency Aimee Mcclain has a historical diagnosed. She is on Vit D over the counter. She notes fatigue.  6. Hypoglycemia Aimee Mcclain's fasting blood sugars <60 on both recent blood tests and neither was fasting.  Assessment/Plan:   1. Other fatigue We will obtain labs today. Aimee Mcclain does feel that her weight is causing her energy to be lower than it should be. Fatigue may be related to Aimee, depression or many other causes. Labs will be ordered, and in  the meanwhile, Aimee Mcclain will focus on self care including making healthy food choices, increasing physical activity and focusing on stress reduction.  - TSH - T4, free - T3  2. SOBOE (shortness of breath on exertion) Aimee Mcclain does feel that she gets out of breath more easily that she used to when she exercises. Aimee Mcclain's shortness of breath appears to be Aimee  related and exercise induced. She has agreed to work on weight loss and gradually increase exercise to treat her exercise induced shortness of breath. Will continue to monitor closely.   3. Essential hypertension We will obtain labs today.  - COMPLETE METABOLIC PANEL WITH GFR  4. Coronary artery disease due to calcified coronary lesion We will obtain labs today.  - Lipid Panel With LDL/HDL Ratio  5. Vitamin D deficiency We will obtain labs today.  - VITAMIN D 25 Hydroxy (Vit-D Deficiency, Fractures)  6. Hypoglycemia We will obtain labs today.  - Hemoglobin A1c - Insulin, random  7. Depression screening Danira had a positive depression screening. Depression is commonly associated with Aimee and often results in emotional eating behaviors. We will monitor this closely and work on CBT to help improve the non-hunger eating patterns. Referral to Psychology may be required if no improvement is seen as she continues in our clinic.   8. Class 1 Aimee with serious comorbidity and body mass index (BMI) of 30.0 to 30.9 in adult, unspecified Aimee type [name] is currently in the action stage of change and her goal is to continue with weight loss efforts. I recommend Aimee Mcclain begin the structured treatment plan as follows:  She has agreed to the Category 3 Plan.  Exercise goals: No exercise has been prescribed at this time.   Behavioral modification strategies: increasing lean protein intake, increasing vegetables, meal planning and cooking strategies, keeping healthy foods in the home, and planning for success.  She was informed of the importance of frequent follow-up visits to maximize her success with intensive lifestyle modifications for her multiple health conditions. She was informed we would discuss her lab results at her next visit unless there is a critical issue that needs to be addressed sooner. Aimee Mcclain agreed to keep her next visit at the agreed upon time to discuss these  results.  Objective:   Blood pressure (!) 156/80, pulse 70, temperature 97.6 F (36.4 C), height '5\' 7"'$  (1.702 m), weight 191 lb (86.6 kg), SpO2 99 %. Body mass index is 29.91 kg/m.  EKG: Normal sinus rhythm, rate 68 bpm.  Indirect Calorimeter completed today shows a VO2 of 278 ml and a REE of 1915.  Her calculated basal metabolic rate is 4098 thus her basal metabolic rate is better than expected.  General: Cooperative, alert, well developed, in no acute distress. HEENT: Conjunctivae and lids unremarkable. Cardiovascular: Regular rhythm.  Lungs: Normal work of breathing. Neurologic: No focal deficits.   Lab Results  Component Value Date   CREATININE 0.83 06/17/2022   BUN 14 06/17/2022   NA 143 06/17/2022   K 4.1 06/17/2022   CL 109 06/17/2022   CO2 29 06/17/2022   Lab Results  Component Value Date   ALT 20 06/17/2022   AST 24 06/17/2022   ALKPHOS 90 06/17/2022   BILITOT 0.5 06/17/2022   Lab Results  Component Value Date   HGBA1C 5.3 08/12/2022   HGBA1C 5.2 08/21/2020   Lab Results  Component Value Date   INSULIN 13.0 08/12/2022   Lab Results  Component Value Date   TSH 1.800 08/12/2022  Lab Results  Component Value Date   CHOL 209 (H) 08/12/2022   HDL 62 08/12/2022   LDLCALC 132 (H) 08/12/2022   TRIG 83 08/12/2022   CHOLHDL 4 02/12/2020   Lab Results  Component Value Date   WBC 4.8 06/17/2022   HGB 13.9 06/17/2022   HCT 40.4 06/17/2022   MCV 88.6 06/17/2022   PLT 205 06/17/2022   No results found for: "IRON", "TIBC", "FERRITIN"  Attestation Statements:   Reviewed by clinician on day of visit: allergies, medications, problem list, medical history, surgical history, family history, social history, and previous encounter notes.  Time spent on visit including pre-visit chart review and post-visit charting and care was 41 minutes.   I, Elnora Morrison, RMA am acting as transcriptionist for Coralie Common, MD. I have reviewed the above documentation  for accuracy and completeness, and I agree with the above. - Coralie Common, MD

## 2022-08-26 ENCOUNTER — Encounter (INDEPENDENT_AMBULATORY_CARE_PROVIDER_SITE_OTHER): Payer: Self-pay | Admitting: Family Medicine

## 2022-08-26 ENCOUNTER — Ambulatory Visit (INDEPENDENT_AMBULATORY_CARE_PROVIDER_SITE_OTHER): Payer: Medicare Other | Admitting: Family Medicine

## 2022-08-26 VITALS — BP 147/82 | HR 92 | Temp 98.3°F | Ht 67.0 in | Wt 186.0 lb

## 2022-08-26 DIAGNOSIS — E669 Obesity, unspecified: Secondary | ICD-10-CM

## 2022-08-26 DIAGNOSIS — E8881 Metabolic syndrome: Secondary | ICD-10-CM

## 2022-08-26 DIAGNOSIS — E7849 Other hyperlipidemia: Secondary | ICD-10-CM

## 2022-08-26 DIAGNOSIS — E559 Vitamin D deficiency, unspecified: Secondary | ICD-10-CM

## 2022-08-26 DIAGNOSIS — E88819 Insulin resistance, unspecified: Secondary | ICD-10-CM

## 2022-08-26 DIAGNOSIS — Z6829 Body mass index (BMI) 29.0-29.9, adult: Secondary | ICD-10-CM

## 2022-08-26 DIAGNOSIS — Z6830 Body mass index (BMI) 30.0-30.9, adult: Secondary | ICD-10-CM

## 2022-08-30 NOTE — Progress Notes (Unsigned)
Chief Complaint:   OBESITY Aimee Mcclain is here to discuss her progress with her obesity treatment plan along with follow-up of her obesity related diagnoses. Aimee Mcclain is on the Category 3 Plan and states she is following her eating plan approximately 100% of the time. Aimee Mcclain states she is walking 2,000 to 5,000 steps for 5 times per week.  Today's visit was #: 2 Starting weight: 191 lbs Starting date: 08/12/2022 Today's weight: 186 lbs Today's date: 08/26/2022 Total lbs lost to date: 5 Total lbs lost since last in-office visit: 5  Interim History: Aimee Mcclain had to do some navigating over the last few weeks to get enough protein in and to be calorie conscious. She is going to [facility] in a couple of weeks. Aimee Mcclain has been working on getting in all her food daily.  Subjective:   1. Other hyperlipidemia Aimee Mcclain's last LDL was 132, HDL was 62, and triglycerides were 83. She is on Lipitor '20mg'$  daily, but started at her last appointment.  2. Vitamin D deficiency Aimee Mcclain's last vitamin D level was 40.0. She is not on a prescription vitamin D, but is currently taking an OTC vitamin D and Calcium D combo each day.   3. Insulin resistance Aimee Mcclain's A1c was 5.3 and her Insulin was 13.0. She is not on medication.  Assessment/Plan:   1. Other hyperlipidemia We will follow up Monicia's FLP in 3 months.  2. Vitamin D deficiency Davy agrees to increase her OTC vitamin D to 5,000IU per day and to follow up as directed.  3. Insulin resistance Pathophysiology of insulin resistance, prediabetes, and diabetes was discussed. There are no changes in her plan. Aimee Mcclain agrees to follow the Category 3 or Journaling plan.  4. Obesity with current BMI of 29.3 Aimee Mcclain is currently in the action stage of change. As such, her goal is to continue with weight loss efforts. She has agreed to keeping a food journal and adhering to recommended goals of 1550 to 1700 calories and 115+ grams of protein.   Exercise  goals: No exercise has been prescribed at this time.  Behavioral modification strategies: increasing lean protein intake, meal planning and cooking strategies, keeping healthy foods in the home, and planning for success.  Aimee Mcclain has agreed to follow-up with our clinic in 3 weeks. She was informed of the importance of frequent follow-up visits to maximize her success with intensive lifestyle modifications for her multiple health conditions.   Objective:   Blood pressure (!) 147/82, pulse 92, temperature 98.3 F (36.8 C), height '5\' 7"'$  (1.702 m), weight 186 lb (84.4 kg), SpO2 97 %. Body mass index is 29.13 kg/m.  General: Cooperative, alert, well developed, in no acute distress. HEENT: Conjunctivae and lids unremarkable. Cardiovascular: Regular rhythm.  Lungs: Normal work of breathing. Neurologic: No focal deficits.   Lab Results  Component Value Date   CREATININE 0.83 06/17/2022   BUN 14 06/17/2022   NA 143 06/17/2022   K 4.1 06/17/2022   CL 109 06/17/2022   CO2 29 06/17/2022   Lab Results  Component Value Date   ALT 20 06/17/2022   AST 24 06/17/2022   ALKPHOS 90 06/17/2022   BILITOT 0.5 06/17/2022   Lab Results  Component Value Date   HGBA1C 5.3 08/12/2022   HGBA1C 5.2 08/21/2020   Lab Results  Component Value Date   INSULIN 13.0 08/12/2022   Lab Results  Component Value Date   TSH 1.800 08/12/2022   Lab Results  Component Value Date  CHOL 209 (H) 08/12/2022   HDL 62 08/12/2022   LDLCALC 132 (H) 08/12/2022   TRIG 83 08/12/2022   CHOLHDL 4 02/12/2020   Lab Results  Component Value Date   VD25OH 40.0 08/12/2022   Lab Results  Component Value Date   WBC 4.8 06/17/2022   HGB 13.9 06/17/2022   HCT 40.4 06/17/2022   MCV 88.6 06/17/2022   PLT 205 06/17/2022   No results found for: "IRON", "TIBC", "FERRITIN"  Obesity Behavioral Intervention:   Approximately 15 minutes were spent on the discussion below.  ASK: We discussed the diagnosis of obesity  with [name] Claude today and Magan agreed to give [place] permission to discuss obesity behavioral modification therapy today.  ASSESS: Aleina has the diagnosis of obesity and her BMI today is 29.3. Emma-Lee is in the action stage of change.   ADVISE: Khristy was educated on the multiple health risks of obesity as well as the benefit of weight loss to improve her health. She was advised of the need for long term treatment and the importance of lifestyle modifications to improve her current health and to decrease her risk of future health problems.  AGREE: Multiple dietary modification options and treatment options were discussed and Alyssandra agreed to follow the recommendations documented in the above note.  ARRANGE: Sissy was educated on the importance of frequent visits to treat obesity as outlined per CMS and USPSTF guidelines and agreed to schedule her next follow up appointment today.  Attestation Statements:   Reviewed by clinician on day of visit: allergies, medications, problem list, medical history, surgical history, family history, social history, and previous encounter notes.  Time spent on visit including pre-visit chart review and post-visit care and charting was 25 minutes.   IMarcille Blanco, CMA, am acting as transcriptionist for Coralie Common, MD  I have reviewed the above documentation for accuracy and completeness, and I agree with the above. - Coralie Common, MD

## 2022-09-08 ENCOUNTER — Encounter: Payer: Self-pay | Admitting: Hematology

## 2022-09-14 ENCOUNTER — Ambulatory Visit (INDEPENDENT_AMBULATORY_CARE_PROVIDER_SITE_OTHER): Payer: Medicare Other | Admitting: Internal Medicine

## 2022-09-14 ENCOUNTER — Inpatient Hospital Stay: Payer: Medicare Other | Admitting: Hematology

## 2022-09-21 ENCOUNTER — Ambulatory Visit: Payer: Medicare Other | Attending: General Surgery

## 2022-09-21 VITALS — Wt 187.5 lb

## 2022-09-21 DIAGNOSIS — Z483 Aftercare following surgery for neoplasm: Secondary | ICD-10-CM | POA: Insufficient documentation

## 2022-09-21 NOTE — Therapy (Signed)
OUTPATIENT PHYSICAL THERAPY SOZO SCREENING NOTE   Patient Name: Aimee Mcclain MRN: [id number] DOB:02/06/1956, 66 y.o., female Today's Date: 09/21/2022  PCP: [name], MD REFERRING PROVIDER: Stark Klein, MD   PT End of Session - 09/21/22 1003     Visit Number 3   # unchanged due to screen only   PT Start Time 1000    PT Stop Time 1005    PT Time Calculation (min) 5 min    Activity Tolerance Patient tolerated treatment well    Behavior During Therapy [facility] for tasks assessed/performed             Past Medical History:  Diagnosis Date   Arthritis    Breast cancer ([place])    Cataract    Colon polyps    Endometriosis    Family history of colon cancer    Family history of colonic polyps    Fuchs' corneal dystrophy    GERD (gastroesophageal reflux disease)    H/O bilateral breast implants    Heartburn    High blood pressure    History of colon polyps    HPV in female    HSV (herpes simplex virus) infection    Migraine    no longer having migraines   Osteoarthritis    Osteopenia    Port-A-Cath in place 03/18/2021   S/P TAH (total abdominal hysterectomy) 1991   Vitamin D deficiency    Past Surgical History:  Procedure Laterality Date   ABDOMINAL HYSTERECTOMY     BREAST BIOPSY     Benign.   BREAST ENHANCEMENT SURGERY     BREAST LUMPECTOMY Bilateral 1993 and 1995   BREAST LUMPECTOMY WITH RADIOACTIVE SEED AND SENTINEL LYMPH NODE BIOPSY Left 02/13/2021   Procedure: LEFT BREAST LUMPECTOMY WITH RADIOACTIVE SEED AND SENTINEL LYMPH NODE BIOPSY WITH SEED TARGETED NODE;  Surgeon: Stark Klein, MD;  Location: [place];  Service: General;  Laterality: Left;   CATARACT EXTRACTION, BILATERAL     CESAREAN SECTION     PARTIAL HYSTERECTOMY     PLACEMENT OF BREAST IMPLANTS  1993   PORT-A-CATH REMOVAL Right 11/04/2021   Procedure: REMOVAL PORT-A-CATH;  Surgeon: Stark Klein, MD;  Location: [place];  Service: General;  Laterality: Right;   PORTACATH  PLACEMENT Right 02/13/2021   Procedure: INSERTION PORT-A-CATH;  Surgeon: Stark Klein, MD;  Location: [place];  Service: General;  Laterality: Right;   TONSILLECTOMY     WISDOM TOOTH EXTRACTION     Patient Active Problem List   Diagnosis Date Noted   COVID-19 05/28/2022   Paronychia of great toe, right 01/16/2022   Pain of right great toe 01/16/2022   Loose toenail 01/16/2022   Leukopenia 12/17/2021   Exposure to COVID-19 virus 12/17/2021   HTN (hypertension) 06/22/2021   Herpes simplex 06/19/2021   Osteopenia 06/19/2021   Medicare welcome exam 05/21/2021   Malignant neoplasm of upper-inner quadrant of left breast in female, estrogen receptor positive ([place]) 01/16/2021   Chronic migraine 08/21/2020   Screening mammogram, encounter for 06/30/2020   Paresthesia 06/09/2020   Neuropathy 02/14/2020   Atypical chest pain 02/14/2020   Healthcare maintenance 02/14/2020   Migraine    Advance care planning 01/16/2019   Family history of colon cancer     REFERRING DIAG: left breast cancer at risk for lymphedema  THERAPY DIAG: Aftercare following surgery for neoplasm  PERTINENT HISTORY: Patient was diagnosed on 01/02/2021 with 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 Ki67 is  30%. Patient underwent a left lumpectomy and sentinel node biopsy (4 negative nodes) on 02/13/2021. Radiation complete 07/28/21   PRECAUTIONS: left UE Lymphedema risk, None  SUBJECTIVE: Pt returns for her 3 month L-Dex screen.   PAIN:  Are you having pain? No  SOZO SCREENING: Patient was assessed today using the SOZO machine to determine the lymphedema index score. This was compared to her baseline score. It was determined that she is within the recommended range when compared to her baseline and no further action is needed at this time. She will continue SOZO screenings. These are done every 3 months for 2 years post operatively followed by every 6 months for 2 years,  and then annually.   L-DEX FLOWSHEETS - 09/21/22 1000       L-DEX LYMPHEDEMA SCREENING   Measurement Type Unilateral    L-DEX MEASUREMENT EXTREMITY Upper Extremity    POSITION  Standing    DOMINANT SIDE Right    At Risk Side Left    BASELINE SCORE (UNILATERAL) 2.6    L-DEX SCORE (UNILATERAL) 0.5    VALUE CHANGE (UNILAT) -2.1              Otelia Limes, PTA 09/21/2022, 10:04 AM

## 2022-09-30 ENCOUNTER — Ambulatory Visit (INDEPENDENT_AMBULATORY_CARE_PROVIDER_SITE_OTHER): Payer: Medicare Other | Admitting: Family Medicine

## 2022-09-30 ENCOUNTER — Encounter (INDEPENDENT_AMBULATORY_CARE_PROVIDER_SITE_OTHER): Payer: Self-pay | Admitting: Family Medicine

## 2022-09-30 VITALS — BP 134/81 | HR 69 | Temp 98.0°F | Ht 67.0 in | Wt 180.0 lb

## 2022-09-30 DIAGNOSIS — Z6828 Body mass index (BMI) 28.0-28.9, adult: Secondary | ICD-10-CM | POA: Diagnosis not present

## 2022-09-30 DIAGNOSIS — E7849 Other hyperlipidemia: Secondary | ICD-10-CM | POA: Diagnosis not present

## 2022-09-30 DIAGNOSIS — E669 Obesity, unspecified: Secondary | ICD-10-CM | POA: Diagnosis not present

## 2022-09-30 DIAGNOSIS — E559 Vitamin D deficiency, unspecified: Secondary | ICD-10-CM | POA: Diagnosis not present

## 2022-10-06 ENCOUNTER — Other Ambulatory Visit: Payer: Self-pay

## 2022-10-06 ENCOUNTER — Encounter: Payer: Self-pay | Admitting: Hematology

## 2022-10-06 NOTE — Progress Notes (Signed)
Chief Complaint:   OBESITY Aimee Mcclain is here to discuss her progress with her obesity treatment plan along with follow-up of her obesity related diagnoses. Aimee Mcclain is on keeping a food journal and adhering to recommended goals of 1550-1700 calories and 115+ grams of protein and states she is following her eating plan approximately 100% of the time. Aimee Mcclain states she is walking 1/2 mile 6 times per week.  Today's visit was #: 3 Starting weight: 191 lbs Starting date: 08/12/2022 Today's weight: 180 lbs Today's date: 09/30/2022 Total lbs lost to date: 11 lbs Total lbs lost since last in-office visit: 6  Interim History: Aimee Mcclain had done well with weight loss; Not getting all calories in consistently--avg calories 1446 with protein 123 grams. Hunger is controlled.  Subjective:   1. Vitamin D deficiency Aimee Mcclain on Vit D 2k units daily. Denies any side effects. Last level of 40.  2. Other hyperlipidemia Aimee Mcclain is on Lipitor. Denies any side effects. LDL improved but not at goal. (HDL 62).  Assessment/Plan:   1. Vitamin D deficiency Continue Vit D and follow up periodically, at least 2-3 times a year to avoid over supplementation.  2. Other hyperlipidemia Continue healthy eating plan/exercise.  3. Obesity with current BMI of 28.2 Aimee Mcclain is currently in the action stage of change. As such, her goal is to continue with weight loss efforts. She has agreed to keeping a food journal and adhering to recommended goals of 1550-1700 calories and 115+ grams of protein.   Discussed needs to work on getting enough calories and continue eating plan and exercise.  Exercise goals: As is.  Behavioral modification strategies: increasing lean protein intake and meal planning and cooking strategies.  Aimee Mcclain has agreed to follow-up with our clinic in 3 weeks. She was informed of the importance of frequent follow-up visits to maximize her success with intensive lifestyle modifications for her multiple  health conditions.   Objective:   Blood pressure 134/81, pulse 69, temperature 98 F (36.7 C), height '5\' 7"'$  (1.702 m), weight 180 lb (81.6 kg), SpO2 99 %. Body mass index is 28.19 kg/m.  General: Cooperative, alert, well developed, in no acute distress. HEENT: Conjunctivae and lids unremarkable. Cardiovascular: Regular rhythm.  Lungs: Normal work of breathing. Neurologic: No focal deficits.   Lab Results  Component Value Date   CREATININE 0.83 06/17/2022   BUN 14 06/17/2022   NA 143 06/17/2022   K 4.1 06/17/2022   CL 109 06/17/2022   CO2 29 06/17/2022   Lab Results  Component Value Date   ALT 20 06/17/2022   AST 24 06/17/2022   ALKPHOS 90 06/17/2022   BILITOT 0.5 06/17/2022   Lab Results  Component Value Date   HGBA1C 5.3 08/12/2022   HGBA1C 5.2 08/21/2020   Lab Results  Component Value Date   INSULIN 13.0 08/12/2022   Lab Results  Component Value Date   TSH 1.800 08/12/2022   Lab Results  Component Value Date   CHOL 209 (H) 08/12/2022   HDL 62 08/12/2022   LDLCALC 132 (H) 08/12/2022   TRIG 83 08/12/2022   CHOLHDL 4 02/12/2020   Lab Results  Component Value Date   VD25OH 40.0 08/12/2022   Lab Results  Component Value Date   WBC 4.8 06/17/2022   HGB 13.9 06/17/2022   HCT 40.4 06/17/2022   MCV 88.6 06/17/2022   PLT 205 06/17/2022   No results found for: "IRON", "TIBC", "FERRITIN"  Attestation Statements:   Reviewed by clinician on day  of visit: allergies, medications, problem list, medical history, surgical history, family history, social history, and previous encounter notes.  I, Elnora Morrison, RMA am acting as transcriptionist for Coralie Common, MD.  I have reviewed the above documentation for accuracy and completeness, and I agree with the above. - Coralie Common, MD

## 2022-10-07 ENCOUNTER — Other Ambulatory Visit: Payer: Self-pay

## 2022-10-07 ENCOUNTER — Encounter: Payer: Self-pay | Admitting: Hematology

## 2022-10-07 ENCOUNTER — Inpatient Hospital Stay: Payer: Medicare Other | Attending: Hematology | Admitting: Hematology

## 2022-10-07 ENCOUNTER — Inpatient Hospital Stay: Payer: Medicare Other

## 2022-10-07 VITALS — BP 129/67 | HR 72 | Temp 98.2°F | Resp 18 | Ht 67.0 in | Wt 185.7 lb

## 2022-10-07 DIAGNOSIS — Z9221 Personal history of antineoplastic chemotherapy: Secondary | ICD-10-CM | POA: Diagnosis not present

## 2022-10-07 DIAGNOSIS — M8580 Other specified disorders of bone density and structure, unspecified site: Secondary | ICD-10-CM | POA: Diagnosis not present

## 2022-10-07 DIAGNOSIS — N951 Menopausal and female climacteric states: Secondary | ICD-10-CM | POA: Diagnosis not present

## 2022-10-07 DIAGNOSIS — R911 Solitary pulmonary nodule: Secondary | ICD-10-CM

## 2022-10-07 DIAGNOSIS — G629 Polyneuropathy, unspecified: Secondary | ICD-10-CM | POA: Insufficient documentation

## 2022-10-07 DIAGNOSIS — Z79810 Long term (current) use of selective estrogen receptor modulators (SERMs): Secondary | ICD-10-CM | POA: Diagnosis not present

## 2022-10-07 DIAGNOSIS — Z923 Personal history of irradiation: Secondary | ICD-10-CM | POA: Insufficient documentation

## 2022-10-07 DIAGNOSIS — C50212 Malignant neoplasm of upper-inner quadrant of left female breast: Secondary | ICD-10-CM

## 2022-10-07 DIAGNOSIS — Z170 Estrogen receptor positive status [ER+]: Secondary | ICD-10-CM | POA: Insufficient documentation

## 2022-10-07 LAB — CBC WITH DIFFERENTIAL (CANCER CENTER ONLY)
Abs Immature Granulocytes: 0.01 10*3/uL (ref 0.00–0.07)
Basophils Absolute: 0 10*3/uL (ref 0.0–0.1)
Basophils Relative: 1 %
Eosinophils Absolute: 0.1 10*3/uL (ref 0.0–0.5)
Eosinophils Relative: 2 %
HCT: 41.5 % (ref 36.0–46.0)
Hemoglobin: 14.1 g/dL (ref 12.0–15.0)
Immature Granulocytes: 0 %
Lymphocytes Relative: 33 %
Lymphs Abs: 1.6 10*3/uL (ref 0.7–4.0)
MCH: 30.5 pg (ref 26.0–34.0)
MCHC: 34 g/dL (ref 30.0–36.0)
MCV: 89.6 fL (ref 80.0–100.0)
Monocytes Absolute: 0.5 10*3/uL (ref 0.1–1.0)
Monocytes Relative: 10 %
Neutro Abs: 2.6 10*3/uL (ref 1.7–7.7)
Neutrophils Relative %: 54 %
Platelet Count: 156 10*3/uL (ref 150–400)
RBC: 4.63 MIL/uL (ref 3.87–5.11)
RDW: 12.4 % (ref 11.5–15.5)
WBC Count: 4.8 10*3/uL (ref 4.0–10.5)
nRBC: 0 % (ref 0.0–0.2)

## 2022-10-07 LAB — CMP (CANCER CENTER ONLY)
ALT: 25 U/L (ref 0–44)
AST: 24 U/L (ref 15–41)
Albumin: 4.3 g/dL (ref 3.5–5.0)
Alkaline Phosphatase: 81 U/L (ref 38–126)
Anion gap: 5 (ref 5–15)
BUN: 17 mg/dL (ref 8–23)
CO2: 28 mmol/L (ref 22–32)
Calcium: 9.6 mg/dL (ref 8.9–10.3)
Chloride: 109 mmol/L (ref 98–111)
Creatinine: 0.77 mg/dL (ref 0.44–1.00)
GFR, Estimated: >60 mL/min (ref >60)
Glucose, Bld: 71 mg/dL (ref 70–99)
Potassium: 4.5 mmol/L (ref 3.5–5.1)
Sodium: 142 mmol/L (ref 135–145)
Total Bilirubin: 0.4 mg/dL (ref 0.3–1.2)
Total Protein: 6.8 g/dL (ref 6.5–8.1)

## 2022-10-07 MED ORDER — TAMOXIFEN CITRATE 20 MG PO TABS: 20.0000 mg | ORAL_TABLET | Freq: Every day | ORAL | 5 refills | Status: DC

## 2022-10-07 NOTE — Progress Notes (Signed)
[place]   [phone] [phone]   Clinic Follow up Note   Patient Care Team: [name], MD as PCP - General (Family Medicine) Harl Bowie Royetta Crochet, MD as PCP - Cardiology (Cardiology) Stark Klein, MD as Consulting Physician (General Surgery) Truitt Merle, MD as Consulting Physician (Hematology) Eppie Gibson, MD as Attending Physician (Radiation Oncology) Delsa Bern, MD as Consulting Physician (Obstetrics and Gynecology) Alla Feeling, NP as Nurse Practitioner (Nurse Practitioner)  Date of Service:  10/07/2022  CHIEF COMPLAINT: f/u of left breast cancer  CURRENT THERAPY:  Antiestrogen therapy, starting 08/2021, currently tamoxifen since 02/2022  ASSESSMENT & PLAN:  Aimee Mcclain is a 66 y.o. post-hysterectomy female with   1. Malignant neoplasm of upper-inner quadrant of left breast, cT1cN1M0 stage Ib, triple positive, pT2N0 stage IA, grade 3 -Diagnosed 12/2020, s/p left lumpectomy and SLNB 02/13/21 by Dr. Barry Dienes -Staging work-up showed small indeterminate lung nodules and an indeterminate left parietal bone lesion which will monitor with future scans, otherwise negative for distant metastasis -She completed 10 of 12 doses of adjuvant Taxol/Abraxane and herceptin on 05/21/21, last 2 doses canceled due to her worsening neuropathy. -s/p radiation 06/30/21 - 07/25/21 under Dr. Isidore Moos. -most recent mammogram on 01/05/22 was benign. -she took exemestane 08/2021 - 01/2022, discontinued due to constipation and increased joint/bone pain. She switched to tamoxifen in 03/2022 and is tolerating better. -she reports some intermittent left breast pain; mammogram 07/27/22 was benign. She endorses massaging the area. She also report upper back pain lately. Labs reviewed, all WNL. Physical exam was unremarkable. There is no high clinical concern for recurrence.   2. Bone health -DEXA 06/24/21 showed osteopenia, with T-score of -1.3. -will follow every 2 years  while on AI. -she endorses taking calcium and vit D.   3. Peripheral neuropathy G1 -She has baseline neuropathy in her feet, etiology unknown. No functional deficits -She is not diabetic, B12 level was normal  -Due to neuropathy on her face, Abraxane discontinued after cycle 10. -she endorses taking vit B complex -she reports worsening neuropathy to her feet.   4. Hot flashes -previously on HRT s/p hysterectomy, stopped with cancer diagnosis in 12/2020 -Hot flashes are moderate but manageable at this time   5. Upper back pain -No injury. Started 3-4 weeks ago -no tenderness, recent CT chest (ordered to rule cardiac work up) was negative for any bone lesion or other definitive evidence of thoracic metastasis -We will follow clinically, if persists or gets worse, will get a thoracic MRI.   Plan: -continue tamoxifen -mammogram due 12/2022 -phone visit in 3 months to f/u her upper back pain  -lab and f/u in 6 months   No problem-specific Assessment & Plan notes found for this encounter.   SUMMARY OF ONCOLOGIC HISTORY: Oncology History Overview Note  Cancer Staging Malignant neoplasm of upper-inner quadrant of left breast in female, estrogen receptor positive ([name]) Staging form: Breast, AJCC 8th Edition - Clinical stage from 01/14/2020: Stage IB (cT1c, cN1, cM0, G3, ER+, PR+, HER2+) - Signed by Truitt Merle, MD on 01/21/2021 Stage prefix: Initial diagnosis - Pathologic stage from 02/13/2021: Stage IA (pT2, pN0, cM0, G3, ER+, PR+, HER2+) - Signed by Heilingoetter, Cassandra L, PA-C on 03/07/2021 Stage prefix: Initial diagnosis Nuclear grade: G3 Histologic grading system: 3 grade system    Malignant neoplasm of upper-inner quadrant of left breast in female, estrogen receptor positive ([name])  01/14/2020 Cancer Staging   Staging form: Breast, AJCC 8th Edition - Clinical stage from  01/14/2020: Stage IB (cT1c, cN1, cM0, G3, ER+, PR+, HER2+) - Signed by Truitt Merle, MD on 01/21/2021 Stage prefix:  Initial diagnosis   01/02/2021 Mammogram   Left breast with 1.9x1x1.8 cm complex cyst at 12:00 position, 4cmfn, versus saloid mass with calcifications, irregulr, increased in size. Biopsy recommended.    01/13/2021 Initial Biopsy   Diagnosis 1. Breast, left, needle core biopsy, 11 o'clock, 4cmfn - INVASIVE DUCTAL CARCINOMA - SEE COMMENT 2. Lymph node, needle/core biopsy, left axilla - FIBROVASCULAR AND ADIPOSE TISSUE - NO LYMPHOID TISSUE OR CARCINOMA IDENTIFIED Microscopic Comment 1. Based on the biopsy, the carcinoma appears Nottingham grade 3 of 3 and measures 1.2 cm in greatest linear extent. Prognostic markers (ER/PR/ki-67/HER2) are pending and will be reported in an addendum. Dr. Jeannie Done reviewed the case and agrees with the above diagnosis. These results were called to [facility] on January 14, 2021   01/13/2021 Receptors her2   1. PROGNOSTIC INDICATORS Results: IMMUNOHISTOCHEMICAL AND MORPHOMETRIC ANALYSIS PERFORMED MANUALLY The tumor cells are POSITIVE for Her2 (3+). Estrogen Receptor: 95%, POSITIVE, STRONG STAINING INTENSITY Progesterone Receptor: 85%, POSITIVE, STRONG STAINING INTENSITY Proliferation Marker Ki67: 30%   01/16/2021 Initial Diagnosis   Malignant neoplasm of upper-inner quadrant of left breast in female, estrogen receptor positive ([place])   01/23/2021 Imaging   MRI Breast  IMPRESSION: 1. Known malignancy measuring 2.9 centimeters in the UPPER INNER QUADRANT of the LEFT breast. There is no evidence for involvement of the pectoralis muscle or implant. 2. No evidence for lymphadenopathy. 3. RIGHT breast is negative. 4. Bilateral retropectoral saline implants.   02/09/2021 Imaging   MRI Brain IMPRESSION: No evidence of intracranial metastatic disease.   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     02/13/2021 Surgery   LEFT BREAST LUMPECTOMY WITH RADIOACTIVE SEED AND SENTINEL LYMPH NODE BIOPSY WITH SEED TARGETED NODE and PAC Placed by Dr Barry Dienes    02/13/2021 Pathology Results   FINAL MICROSCOPIC DIAGNOSIS:   A. BREAST, LEFT, LUMPECTOMY:  - Invasive ductal carcinoma, 2.6 cm, Nottingham grade 3 of 3.  - Ductal carcinoma in situ, high grade with central necrosis.  - Margins of resection:       - Invasive carcinoma broadly involves the anterior and inferior  margins.  Invasive carcinoma is focally < 1 mm from each the superior  and medial margins.       - DCIS is focally < 1 mm from the inferior margin.  DCIS is focally  1 mm from each the anterior and superior margins.  DCIS is focally 1-2  mm from the medial margin.  - Biopsy clip.  - See oncology table.   B. BREAST, LEFT, ANTERIOR TISSUE, EXCISION:  - Residual invasive and in-situ ductal carcinoma.  - Residual invasive ductal carcinoma broadly involves the posterior  margin.  - Residual DCIS is focally 1 mm from the posterior margin.   C. BREAST, LEFT, INFERIOR MARGIN, EXCISION:  - No carcinoma identified.   D. SENTINEL LYMPH NODE, LEFT AXILLARY #1, BIOPSY:  - No carcinoma identified in one lymph node (0/1).   E. SENTINEL LYMPH NODE, LEFT AXILLARY #2, BIOPSY:  - Biopsy site.  - No distinct nodal tissue identified.  - No carcinoma identified.   F. SENTINEL LYMPH NODE, LEFT AXILLARY #3, BIOPSY:  - No carcinoma identified in one lymph node (0/1).   G. SENTINEL LYMPH NODE, LEFT AXILLARY #4, BIOPSY:  - No carcinoma identified in one lymph node (0/1).    02/13/2021 Cancer Staging   Staging form: Breast, AJCC  8th Edition - Pathologic stage from 02/13/2021: Stage IA (pT2, pN0, cM0, G3, ER+, PR+, HER2+) - Signed by Heilingoetter, Cassandra L, PA-C on 03/07/2021 Stage prefix: Initial diagnosis Nuclear grade: G3 Histologic grading system: 3 grade system   03/18/2021 - 02/19/2022 Chemotherapy   Patient is on Treatment Plan : BREAST Paclitaxel + Trastuzumab q7d / Trastuzumab q21d     06/2021 - 07/2021 Radiation Therapy   Adjuvant radiation per Dr. Isidore Moos   08/20/2021 Imaging   CT  Chest w/o contrast  IMPRESSION: No definite evidence of pulmonary metastatic disease.   5 mm indeterminate right lower lobe pulmonary nodule. Comparison with prior examinations, if available, is recommended to determine chronicity. No follow-up needed if patient is low-risk. In absence of priors, Non-contrast chest CT can be considered in 12 months if patient is high-risk. This recommendation follows the consensus statement: Guidelines for Management of Incidental Pulmonary Nodules Detected on CT Images: From the Fleischner Society 2017; Radiology 2017; 284:228-243.   Mild coronary artery calcification.   08/20/2021 Imaging   CT Head w/o contrast  IMPRESSION: No acute abnormality. No evidence of intracranial metastatic disease.   08/2021 -  Anti-estrogen oral therapy   Began adjuvant exemestane; changed to tamoxifen 02/2022   11/13/2021 Survivorship   SCP delivered by [name], NP      INTERVAL HISTORY:  Adalei Novell Waterhouse is here for a follow up of breast cancer. She was last seen by me on 02/19/22 with survivorship in the interim. She presents to the clinic accompanied by her husband. She reports she is tolerating tamoxifen much better. She reports new back pain between her shoulder blades over the last month. She also reports some intermittent left breast pain, which prompted her to undergo mammogram. She also reports some worsening of the neuropathy in her feet.   All other systems were reviewed with the patient and are negative.  MEDICAL HISTORY:  Past Medical History:  Diagnosis Date   Arthritis    Breast cancer ([place])    Cataract    Colon polyps    Endometriosis    Family history of colon cancer    Family history of colonic polyps    Fuchs' corneal dystrophy    GERD (gastroesophageal reflux disease)    H/O bilateral breast implants    Heartburn    High blood pressure    History of colon polyps    HPV in female    HSV (herpes simplex virus) infection    Migraine     no longer having migraines   Osteoarthritis    Osteopenia    Port-A-Cath in place 03/18/2021   S/P TAH (total abdominal hysterectomy) 1991   Vitamin D deficiency     SURGICAL HISTORY: Past Surgical History:  Procedure Laterality Date   ABDOMINAL HYSTERECTOMY     BREAST BIOPSY     Benign.   BREAST ENHANCEMENT SURGERY     BREAST LUMPECTOMY Bilateral 1993 and 1995   BREAST LUMPECTOMY WITH RADIOACTIVE SEED AND SENTINEL LYMPH NODE BIOPSY Left 02/13/2021   Procedure: LEFT BREAST LUMPECTOMY WITH RADIOACTIVE SEED AND SENTINEL LYMPH NODE BIOPSY WITH SEED TARGETED NODE;  Surgeon: Stark Klein, MD;  Location: [facility];  Service: General;  Laterality: Left;   CATARACT EXTRACTION, BILATERAL     CESAREAN SECTION     PARTIAL HYSTERECTOMY     PLACEMENT OF BREAST IMPLANTS  1993   PORT-A-CATH REMOVAL Right 11/04/2021   Procedure: REMOVAL PORT-A-CATH;  Surgeon: Stark Klein, MD;  Location: [facility];  Service: General;  Laterality: Right;   PORTACATH PLACEMENT Right 02/13/2021   Procedure: INSERTION PORT-A-CATH;  Surgeon: Stark Klein, MD;  Location: [facility];  Service: General;  Laterality: Right;   TONSILLECTOMY     WISDOM TOOTH EXTRACTION      I have reviewed the social history and family history with the patient and they are unchanged from previous note.  ALLERGIES:  is allergic to ibuprofen, omnicef [cefdinir], proton pump inhibitors, roxicodone [oxycodone hcl], and vioxx [rofecoxib].  MEDICATIONS:  Current Outpatient Medications  Medication Sig Dispense Refill   acetaminophen (TYLENOL) 500 MG tablet Take 1,000 mg by mouth every 6 (six) hours as needed for moderate pain.     atorvastatin (LIPITOR) 20 MG tablet Take 1 tablet (20 mg total) by mouth daily. 90 tablet 3   Calcium Carbonate-Vitamin D (CALCIUM 600+D PO) Take 1 tablet by mouth daily.     Cholecalciferol (VITAMIN D3 GUMMIES PO) Take 1,000 Units by mouth daily.     Multiple Vitamin (MULTIVITAMIN)  tablet Take 1 tablet by mouth daily.     omeprazole (PRILOSEC) 20 MG capsule Take 20 mg by mouth daily.     tamoxifen (NOLVADEX) 20 MG tablet Take 1 tablet (20 mg total) by mouth daily. 30 tablet 5   valACYclovir (VALTREX) 500 MG tablet Take 1 tablet (500 mg total) by mouth 2 (two) times daily as needed.     Vitamin B Complex-C CAPS Take 1 capsule by mouth.     No current facility-administered medications for this visit.    PHYSICAL EXAMINATION: ECOG PERFORMANCE STATUS: 1 - Symptomatic but completely ambulatory  Vitals:   10/07/22 0907  BP: 129/67  Pulse: 72  Resp: 18  Temp: 98.2 F (36.8 C)  SpO2: 100%   Wt Readings from Last 3 Encounters:  10/07/22 185 lb 11.2 oz (84.2 kg)  09/30/22 180 lb (81.6 kg)  09/21/22 187 lb 8 oz (85 kg)     GENERAL:alert, no distress and comfortable SKIN: skin color, texture, turgor are normal, no rashes or significant lesions EYES: normal, Conjunctiva are pink and non-injected, sclera clear  NECK: supple, thyroid normal size, non-tender, without nodularity LYMPH:  no palpable lymphadenopathy in the cervical, axillary LUNGS: clear to auscultation and percussion with normal breathing effort HEART: regular rate & rhythm and no murmurs and no lower extremity edema ABDOMEN:abdomen soft, non-tender and normal bowel sounds Musculoskeletal:no cyanosis of digits and no clubbing  NEURO: alert & oriented x 3 with fluent speech, no focal motor/sensory deficits BREAST: (+) mild lymphedema. No palpable mass, nodules or adenopathy bilaterally. Breast exam benign.   LABORATORY DATA:  I have reviewed the data as listed    Latest Ref Rng & Units 10/07/2022    8:19 AM 06/17/2022    9:09 AM 02/19/2022    8:23 AM  CBC  WBC 4.0 - 10.5 K/uL 4.8  4.8  4.6   Hemoglobin 12.0 - 15.0 g/dL 14.1  13.9  14.6   Hematocrit 36.0 - 46.0 % 41.5  40.4  44.6   Platelets 150 - 400 K/uL 156  205  235         Latest Ref Rng & Units 10/07/2022    8:19 AM 06/17/2022    9:09 AM  02/19/2022    8:23 AM  CMP  Glucose 70 - 99 mg/dL 71  58  50   BUN 8 - 23 mg/dL _0 Creatinine 0.44 - 1.00 mg/dL 0.77  0.83  0.78  Sodium 135 - 145 mmol/L 142  143  142   Potassium 3.5 - 5.1 mmol/L 4.5  4.1  4.7   Chloride 98 - 111 mmol/L 109  109  106   CO2 22 - 32 mmol/L _0 Calcium 8.9 - 10.3 mg/dL 9.6  9.6  9.5   Total Protein 6.5 - 8.1 g/dL 6.8  6.9  7.4   Total Bilirubin 0.3 - 1.2 mg/dL 0.4  0.5  0.5   Alkaline Phos 38 - 126 U/L 81  90  100   AST 15 - 41 U/L 24  24  35   ALT 0 - 44 U/L 25  20  32       RADIOGRAPHIC STUDIES: I have personally reviewed the radiological images as listed and agreed with the findings in the report. No results found.    No orders of the defined types were placed in this encounter.  All questions were answered. The patient knows to call the clinic with any problems, questions or concerns. No barriers to learning was detected. The total time spent in the appointment was 30 minutes.     Truitt Merle, MD 10/07/2022   I, Wilburn Mylar, am acting as scribe for Truitt Merle, MD.   I have reviewed the above documentation for accuracy and completeness, and I agree with the above.

## 2022-10-07 NOTE — Progress Notes (Signed)
New Patient Referral for Radiation Oncology.  Pt will get Capecitabine which will be managed by Dr. Burr Medico here at [place] [facility].  I hope to start chemoRT within the next 3 to 4 wks with weekly labs to be drawn at [facility].

## 2022-10-07 NOTE — Progress Notes (Signed)
Verbal order for referral to Rad/Onc in [place], [place] to start w/in the next 3 to 4 wks with weekly labs (CBC w/Diff and CMP).  [facility] part of [place] [phone]) spoke with [place].  Colletta Maryland stated to fax the pt's records to her attention and she will get the provider review the records w/labs and get the pt scheduled.  Informed Colletta Maryland that the pt will get wkly labs because she will be taking PO chemo.  Colletta Maryland stated to send the lab orders and they will fax the lab results to Dr. Ernestina Penna office each week.  Faxed [place] the required orders and documents.  Fax confirmation received.

## 2022-10-07 NOTE — Progress Notes (Signed)
Canceled referral to [facility].  Dr. Burr Medico gave incorrect verbal order on the wrong patient.

## 2022-10-21 ENCOUNTER — Ambulatory Visit (INDEPENDENT_AMBULATORY_CARE_PROVIDER_SITE_OTHER): Payer: Medicare Other | Admitting: Family Medicine

## 2022-10-21 ENCOUNTER — Encounter (INDEPENDENT_AMBULATORY_CARE_PROVIDER_SITE_OTHER): Payer: Self-pay | Admitting: Family Medicine

## 2022-10-21 VITALS — BP 133/69 | HR 77 | Temp 98.7°F | Ht 67.0 in | Wt 180.0 lb

## 2022-10-21 DIAGNOSIS — E7849 Other hyperlipidemia: Secondary | ICD-10-CM | POA: Diagnosis not present

## 2022-10-21 DIAGNOSIS — Z6828 Body mass index (BMI) 28.0-28.9, adult: Secondary | ICD-10-CM

## 2022-10-21 DIAGNOSIS — E669 Obesity, unspecified: Secondary | ICD-10-CM | POA: Diagnosis not present

## 2022-10-21 DIAGNOSIS — Z6830 Body mass index (BMI) 30.0-30.9, adult: Secondary | ICD-10-CM

## 2022-10-21 DIAGNOSIS — E559 Vitamin D deficiency, unspecified: Secondary | ICD-10-CM | POA: Diagnosis not present

## 2022-10-28 NOTE — Progress Notes (Signed)
Chief Complaint:   OBESITY Aimee Mcclain is here to discuss her progress with her obesity treatment plan along with follow-up of her obesity related diagnoses. Aimee Mcclain is on keeping a food journal and adhering to recommended goals of 1550-1700 calories and 115+ grams of protein and states she is following her eating plan approximately 100% of the time. Aimee Mcclain states she is going to the gym 60 minutes 3 times per week.  Today's visit was #: 4 Starting weight: 191 lbs Starting date: 08/12/2022 Today's weight: 180 lbs Today's date: 10/21/2022 Total lbs lost to date: 11 lbs Total lbs lost since last in-office visit: 0  Interim History: Aimee Mcclain started at gym this week. She is logging consistently and getting over 1550 cal daily but not always getting 1700 cal. She is building back up in terms of exercise. She is feeling frustrated with lack of weight loss.  Subjective:   1. Other hyperlipidemia Aimee Mcclain's last LDL of 132, HDL of 62 and Trigly of 83. She is on Lipitor 20 mg.  2. Vitamin D deficiency Aimee Mcclain is on over the counter Vit D. She notes fatigue.  Assessment/Plan:   1. Other hyperlipidemia Follow up with labs in next few weeks.  2. Vitamin D deficiency Continue over the counter Vit D.  3. Obesity with current BMI of 28.2 Aimee Mcclain is currently in the action stage of change. As such, her goal is to continue with weight loss efforts. She has agreed to keeping a food journal and adhering to recommended goals of 1550-1700 calories and 115+ grams of protein daily.   Exercise goals: All adults should avoid inactivity. Some physical activity is better than none, and adults who participate in any amount of physical activity gain some health benefits.  Behavioral modification strategies: increasing lean protein intake, meal planning and cooking strategies, keeping healthy foods in the home, and planning for success.  Aimee Mcclain has agreed to follow-up with our clinic in 3 weeks. She was informed  of the importance of frequent follow-up visits to maximize her success with intensive lifestyle modifications for her multiple health conditions.   Objective:   Blood pressure 133/69, pulse 77, temperature 98.7 F (37.1 C), height '5\' 7"'$  (1.702 m), weight 180 lb (81.6 kg), SpO2 99 %. Body mass index is 28.19 kg/m.  General: Cooperative, alert, well developed, in no acute distress. HEENT: Conjunctivae and lids unremarkable. Cardiovascular: Regular rhythm.  Lungs: Normal work of breathing. Neurologic: No focal deficits.   Lab Results  Component Value Date   CREATININE 0.77 10/07/2022   BUN 17 10/07/2022   NA 142 10/07/2022   K 4.5 10/07/2022   CL 109 10/07/2022   CO2 28 10/07/2022   Lab Results  Component Value Date   ALT 25 10/07/2022   AST 24 10/07/2022   ALKPHOS 81 10/07/2022   BILITOT 0.4 10/07/2022   Lab Results  Component Value Date   HGBA1C 5.3 08/12/2022   HGBA1C 5.2 08/21/2020   Lab Results  Component Value Date   INSULIN 13.0 08/12/2022   Lab Results  Component Value Date   TSH 1.800 08/12/2022   Lab Results  Component Value Date   CHOL 209 (H) 08/12/2022   HDL 62 08/12/2022   LDLCALC 132 (H) 08/12/2022   TRIG 83 08/12/2022   CHOLHDL 4 02/12/2020   Lab Results  Component Value Date   VD25OH 40.0 08/12/2022   Lab Results  Component Value Date   WBC 4.8 10/07/2022   HGB 14.1 10/07/2022   HCT  41.5 10/07/2022   MCV 89.6 10/07/2022   PLT 156 10/07/2022   No results found for: "IRON", "TIBC", "FERRITIN"  Attestation Statements:   Reviewed by clinician on day of visit: allergies, medications, problem list, medical history, surgical history, family history, social history, and previous encounter notes.  I, Elnora Morrison, RMA am acting as transcriptionist for Coralie Common, MD.  I have reviewed the above documentation for accuracy and completeness, and I agree with the above. - Coralie Common, MD

## 2022-11-11 ENCOUNTER — Encounter (INDEPENDENT_AMBULATORY_CARE_PROVIDER_SITE_OTHER): Payer: Self-pay | Admitting: Family Medicine

## 2022-11-11 ENCOUNTER — Ambulatory Visit (INDEPENDENT_AMBULATORY_CARE_PROVIDER_SITE_OTHER): Payer: Medicare Other | Admitting: Family Medicine

## 2022-11-11 VITALS — BP 148/76 | HR 69 | Temp 98.3°F | Ht 67.0 in | Wt 177.0 lb

## 2022-11-11 DIAGNOSIS — E7849 Other hyperlipidemia: Secondary | ICD-10-CM

## 2022-11-11 DIAGNOSIS — E88819 Insulin resistance, unspecified: Secondary | ICD-10-CM

## 2022-11-11 DIAGNOSIS — Z6827 Body mass index (BMI) 27.0-27.9, adult: Secondary | ICD-10-CM

## 2022-11-11 DIAGNOSIS — E669 Obesity, unspecified: Secondary | ICD-10-CM | POA: Diagnosis not present

## 2022-11-25 NOTE — Progress Notes (Signed)
Chief Complaint:   OBESITY Aimee Mcclain is here to discuss her progress with her obesity treatment plan along with follow-up of her obesity related diagnoses. Aimee Mcclain is on keeping a food journal and adhering to recommended goals of 1550-1700 calories and 118+ grams of protein and states she is following her eating plan approximately 85% of the time. [name] states she is going to the gym 60 minutes 2-3 times per week.  Today's visit was #: 5 Starting weight: 191 lbs Starting date: 08/12/2022 Today's weight: 177 lbs Today's date: 11/11/2022 Total lbs lost to date: 14 lbs Total lbs lost since last in-office visit: 3  Interim History: Aimee Mcclain had a whole house full for Thanksgiving. She left some relief when everyone went home. Trying to make herself eat but realizes she is not getting enough protein in. Ending up protein at 115 grams a day at least. Doing 15 minutes of treadmill and then the rest is weights/resistance.  Subjective:   1. Other hyperlipidemia Satin is on Lipitor 20 mg with no myalgias or transaminitis.  2. Insulin resistance A1c at 5.3, insulin at 13.0. Not on medications.  Assessment/Plan:   1. Other hyperlipidemia Continue Lipitor; Labs at next appointment.  2. Insulin resistance Continue journaling. Labs at next appointment.  3. Obesity with current BMI of 27.8 Aimee Mcclain is currently in the action stage of change. As such, her goal is to continue with weight loss efforts. She has agreed to keeping a food journal and adhering to recommended goals of 1550-1700 calories and 115+ grams of protein daily.   Exercise goals: As is. Encouraged to increase frequent of resistance training.  Behavioral modification strategies: increasing lean protein intake, meal planning and cooking strategies, keeping healthy foods in the home, and planning for success.  Aimee Mcclain has agreed to follow-up with our clinic in 5 weeks. She was informed of the importance of frequent follow-up visits  to maximize her success with intensive lifestyle modifications for her multiple health conditions.   Objective:   Blood pressure (!) 148/76, pulse 69, temperature 98.3 F (36.8 C), height '5\' 7"'$  (1.702 m), weight 177 lb (80.3 kg), SpO2 99 %. Body mass index is 27.72 kg/m.  General: Cooperative, alert, well developed, in no acute distress. HEENT: Conjunctivae and lids unremarkable. Cardiovascular: Regular rhythm.  Lungs: Normal work of breathing. Neurologic: No focal deficits.   Lab Results  Component Value Date   CREATININE 0.77 10/07/2022   BUN 17 10/07/2022   NA 142 10/07/2022   K 4.5 10/07/2022   CL 109 10/07/2022   CO2 28 10/07/2022   Lab Results  Component Value Date   ALT 25 10/07/2022   AST 24 10/07/2022   ALKPHOS 81 10/07/2022   BILITOT 0.4 10/07/2022   Lab Results  Component Value Date   HGBA1C 5.3 08/12/2022   HGBA1C 5.2 08/21/2020   Lab Results  Component Value Date   INSULIN 13.0 08/12/2022   Lab Results  Component Value Date   TSH 1.800 08/12/2022   Lab Results  Component Value Date   CHOL 209 (H) 08/12/2022   HDL 62 08/12/2022   LDLCALC 132 (H) 08/12/2022   TRIG 83 08/12/2022   CHOLHDL 4 02/12/2020   Lab Results  Component Value Date   VD25OH 40.0 08/12/2022   Lab Results  Component Value Date   WBC 4.8 10/07/2022   HGB 14.1 10/07/2022   HCT 41.5 10/07/2022   MCV 89.6 10/07/2022   PLT 156 10/07/2022   No results found for: "  IRON", "TIBC", "FERRITIN"   Attestation Statements:   Reviewed by clinician on day of visit: allergies, medications, problem list, medical history, surgical history, family history, social history, and previous encounter notes.  I, Elnora Morrison, RMA am acting as transcriptionist for Coralie Common, MD. I have reviewed the above documentation for accuracy and completeness, and I agree with the above. - Coralie Common, MD

## 2022-12-16 ENCOUNTER — Encounter (INDEPENDENT_AMBULATORY_CARE_PROVIDER_SITE_OTHER): Payer: Self-pay | Admitting: Family Medicine

## 2022-12-17 ENCOUNTER — Ambulatory Visit (INDEPENDENT_AMBULATORY_CARE_PROVIDER_SITE_OTHER): Payer: Medicare Other | Admitting: Family Medicine

## 2022-12-17 ENCOUNTER — Encounter (INDEPENDENT_AMBULATORY_CARE_PROVIDER_SITE_OTHER): Payer: Self-pay | Admitting: Family Medicine

## 2022-12-17 VITALS — BP 147/77 | HR 69 | Temp 97.4°F | Ht 67.0 in | Wt 177.0 lb

## 2022-12-17 DIAGNOSIS — E7849 Other hyperlipidemia: Secondary | ICD-10-CM | POA: Diagnosis not present

## 2022-12-17 DIAGNOSIS — E162 Hypoglycemia, unspecified: Secondary | ICD-10-CM | POA: Diagnosis not present

## 2022-12-17 DIAGNOSIS — Z6827 Body mass index (BMI) 27.0-27.9, adult: Secondary | ICD-10-CM

## 2022-12-17 DIAGNOSIS — E559 Vitamin D deficiency, unspecified: Secondary | ICD-10-CM

## 2022-12-17 DIAGNOSIS — E669 Obesity, unspecified: Secondary | ICD-10-CM

## 2022-12-17 DIAGNOSIS — I10 Essential (primary) hypertension: Secondary | ICD-10-CM | POA: Diagnosis not present

## 2022-12-18 LAB — INSULIN, RANDOM: INSULIN: 7.9 u[IU]/mL (ref 2.6–24.9)

## 2022-12-18 LAB — HEMOGLOBIN A1C
Est. average glucose Bld gHb Est-mCnc: 108 mg/dL
Hgb A1c MFr Bld: 5.4 % (ref 4.8–5.6)

## 2022-12-18 LAB — COMPREHENSIVE METABOLIC PANEL
ALT: 25 IU/L (ref 0–32)
AST: 26 IU/L (ref 0–40)
Albumin/Globulin Ratio: 2.1 (ref 1.2–2.2)
Albumin: 4.6 g/dL (ref 3.9–4.9)
Alkaline Phosphatase: 81 IU/L (ref 44–121)
BUN/Creatinine Ratio: 23 (ref 12–28)
BUN: 16 mg/dL (ref 8–27)
Bilirubin Total: 0.4 mg/dL (ref 0.0–1.2)
CO2: 22 mmol/L (ref 20–29)
Calcium: 9.7 mg/dL (ref 8.7–10.3)
Chloride: 105 mmol/L (ref 96–106)
Creatinine, Ser: 0.71 mg/dL (ref 0.57–1.00)
Globulin, Total: 2.2 g/dL (ref 1.5–4.5)
Glucose: 87 mg/dL (ref 70–99)
Potassium: 4.8 mmol/L (ref 3.5–5.2)
Sodium: 144 mmol/L (ref 134–144)
Total Protein: 6.8 g/dL (ref 6.0–8.5)
eGFR: 94 mL/min/{1.73_m2} (ref >59)

## 2022-12-18 LAB — LIPID PANEL WITH LDL/HDL RATIO
Cholesterol, Total: 151 mg/dL (ref 100–199)
HDL: 64 mg/dL (ref >39)
LDL Chol Calc (NIH): 74 mg/dL (ref 0–99)
LDL/HDL Ratio: 1.2 ratio (ref 0.0–3.2)
Triglycerides: 66 mg/dL (ref 0–149)
VLDL Cholesterol Cal: 13 mg/dL (ref 5–40)

## 2022-12-18 LAB — VITAMIN D 25 HYDROXY (VIT D DEFICIENCY, FRACTURES): Vit D, 25-Hydroxy: 60.9 ng/mL (ref 30.0–100.0)

## 2022-12-21 ENCOUNTER — Ambulatory Visit: Payer: Medicare Other

## 2022-12-22 ENCOUNTER — Telehealth (INDEPENDENT_AMBULATORY_CARE_PROVIDER_SITE_OTHER): Payer: Medicare Other | Admitting: Primary Care

## 2022-12-22 ENCOUNTER — Encounter: Payer: Self-pay | Admitting: Primary Care

## 2022-12-22 VITALS — BP 134/84 | HR 106 | Temp 97.6°F

## 2022-12-22 DIAGNOSIS — U071 COVID-19: Secondary | ICD-10-CM | POA: Diagnosis not present

## 2022-12-22 MED ORDER — NIRMATRELVIR/RITONAVIR (PAXLOVID)TABLET: 3.0000 | ORAL_TABLET | Freq: Two times a day (BID) | ORAL | 0 refills | Status: AC

## 2022-12-22 NOTE — Progress Notes (Signed)
Patient ID: Aimee Mcclain, female    DOB: 14-Nov-1956, 67 y.o.   MRN: [id number]  Virtual visit completed through [place], a video enabled telemedicine application. Due to national recommendations of social distancing due to COVID-19, a virtual visit is felt to be most appropriate for this patient at this time. Reviewed limitations, risks, security and privacy concerns of performing a virtual visit and the availability of in person appointments. I also reviewed that there may be a patient responsible charge related to this service. The patient agreed to proceed.   Patient location: home Provider location: [place] at [facility], office Persons participating in this virtual visit: patient, provider   If any vitals were documented, they were collected by patient at home unless specified below.    BP 134/84 Comment: patient reported  Pulse (!) 106 Comment: patient reported  Temp 97.6 F (36.4 C) Comment: patient reported  SpO2 98% Comment: patient reported   CC: Positive Covid-19 infection Subjective:   HPI: Aimee Mcclain is a 67 y.o. female patient of Dr. Damita Dunnings with a history of hypertension, Covid-19 infection, migraines, breast cancer presenting on 12/22/2022 for Cough (Symptoms began Monday 12/21/22 Cough, Sore throat, headache /At home covid test this morning- positive )  Symptom onset yesterday with sore throat. She then developed cough, headache. She took an at home Covid-19 test this morning which was positive.   She has a history of Covid-19 infection, last infection was in June 2023. At the time she was treated with a course of Paxlovid.   She denies SOB, chest pain. She is monitoring her vitals and is seeing a pulse oxygenation level of 98% on room air. She has taken Nyquil and Dayquil for symptoms with some improvement. She did receive her Covid-19 booster and influenza vaccine in November 2023. She is interested in antiviral treatment as she tolerated well in June  2023.     Relevant past medical, surgical, family and social history reviewed and updated as indicated. Interim medical history since our last visit reviewed. Allergies and medications reviewed and updated. Outpatient Medications Prior to Visit  Medication Sig Dispense Refill   acetaminophen (TYLENOL) 500 MG tablet Take 1,000 mg by mouth every 6 (six) hours as needed for moderate pain.     atorvastatin (LIPITOR) 20 MG tablet Take 1 tablet (20 mg total) by mouth daily. 90 tablet 3   Calcium Carbonate-Vitamin D (CALCIUM 600+D PO) Take 1 tablet by mouth daily.     Cholecalciferol (VITAMIN D3 GUMMIES PO) Take 1,000 Units by mouth daily.     Multiple Vitamin (MULTIVITAMIN) tablet Take 1 tablet by mouth daily.     omeprazole (PRILOSEC) 20 MG capsule Take 20 mg by mouth daily.     tamoxifen (NOLVADEX) 20 MG tablet Take 1 tablet (20 mg total) by mouth daily. 30 tablet 5   valACYclovir (VALTREX) 500 MG tablet Take 1 tablet (500 mg total) by mouth 2 (two) times daily as needed.     Vitamin B Complex-C CAPS Take 1 capsule by mouth.     No facility-administered medications prior to visit.     Per HPI unless specifically indicated in ROS section below Review of Systems  Constitutional:  Negative for fever.  HENT:  Positive for sore throat.   Respiratory:  Positive for cough. Negative for shortness of breath.   Neurological:  Positive for headaches.   Objective:  BP 134/84 Comment: patient reported  Pulse (!) 106 Comment: patient reported  Temp 97.6 F (36.4  C) Comment: patient reported  SpO2 98% Comment: patient reported  Wt Readings from Last 3 Encounters:  12/17/22 177 lb (80.3 kg)  11/11/22 177 lb (80.3 kg)  10/21/22 180 lb (81.6 kg)       Physical exam: General: Alert and oriented x 3, no distress, does not appear sickly  Pulmonary: Speaks in complete sentences without increased work of breathing, no cough during visit.  Psychiatric: Normal mood, thought content, and behavior.      Results for orders placed or performed in visit on 12/17/22  Comprehensive metabolic panel  Result Value Ref Range   Glucose 87 70 - 99 mg/dL   BUN 16 8 - 27 mg/dL   Creatinine, Ser 0.71 0.57 - 1.00 mg/dL   eGFR 94 >59 mL/min/1.73   BUN/Creatinine Ratio 23 12 - 28   Sodium 144 134 - 144 mmol/L   Potassium 4.8 3.5 - 5.2 mmol/L   Chloride 105 96 - 106 mmol/L   CO2 22 20 - 29 mmol/L   Calcium 9.7 8.7 - 10.3 mg/dL   Total Protein 6.8 6.0 - 8.5 g/dL   Albumin 4.6 3.9 - 4.9 g/dL   Globulin, Total 2.2 1.5 - 4.5 g/dL   Albumin/Globulin Ratio 2.1 1.2 - 2.2   Bilirubin Total 0.4 0.0 - 1.2 mg/dL   Alkaline Phosphatase 81 44 - 121 IU/L   AST 26 0 - 40 IU/L   ALT 25 0 - 32 IU/L  Hemoglobin A1c  Result Value Ref Range   Hgb A1c MFr Bld 5.4 4.8 - 5.6 %   Est. average glucose Bld gHb Est-mCnc 108 mg/dL  Insulin, random  Result Value Ref Range   INSULIN 7.9 2.6 - 24.9 uIU/mL  Lipid Panel With LDL/HDL Ratio  Result Value Ref Range   Cholesterol, Total 151 100 - 199 mg/dL   Triglycerides 66 0 - 149 mg/dL   HDL 64 >39 mg/dL   VLDL Cholesterol Cal 13 5 - 40 mg/dL   LDL Chol Calc (NIH) 74 0 - 99 mg/dL   LDL/HDL Ratio 1.2 0.0 - 3.2 ratio  VITAMIN D 25 Hydroxy (Vit-D Deficiency, Fractures)  Result Value Ref Range   Vit D, 25-Hydroxy 60.9 30.0 - 100.0 ng/mL   Assessment & Plan:   Problem List Items Addressed This Visit       Other   COVID-19 - Primary    Patient qualifies for antiviral treatment.  Given her history of cancer, patient agreeable to treat with Paxlovid.   Discussed OTC meds for tylenol, dayquil or nyquil for symptom management.   Quarantine precautions provided. Ed precautions given.   Follow up with PCP if symptoms worsen or there is no improvement.   I evaluated patient, was consulted regarding treatment, and agree with assessment and plan per Tinnie Gens, RN, DNP student.   Allie Bossier, NP-C        Relevant Medications   nirmatrelvir/ritonavir (PAXLOVID) 20  x 150 MG & 10 x '100MG'$  TABS     Meds ordered this encounter  Medications   nirmatrelvir/ritonavir (PAXLOVID) 20 x 150 MG & 10 x '100MG'$  TABS    Sig: Take 3 tablets by mouth 2 (two) times daily for 5 days. (Take nirmatrelvir 150 mg two tablets twice daily for 5 days and ritonavir 100 mg one tablet twice daily for 5 days) Patient GFR is 94 on 12/17/22.    Dispense:  30 tablet    Refill:  0   No orders of the defined types were placed  in this encounter.   I discussed the assessment and treatment plan with the patient. The patient was provided an opportunity to ask questions and all were answered. The patient agreed with the plan and demonstrated an understanding of the instructions. The patient was advised to call back or seek an in-person evaluation if the symptoms worsen or if the condition fails to improve as anticipated.  Follow up plan:  Start Paxlovid antiviral treatment for Covid-19 infection.   You may continue Dayquil and Nyquil for cough.   It was a pleasure meeting you!   Pleas Koch, NP

## 2022-12-22 NOTE — Progress Notes (Signed)
Established Patient Office Visit  Subjective   Patient ID: Aimee Mcclain, female    DOB: [birth year]  Age: 67 y.o. MRN: [id number]  Chief Complaint  Patient presents with   Cough    Symptoms began Monday 12/21/22 Cough, Sore throat, headache  At home covid test this morning- positive     Cough Associated symptoms include myalgias. Pertinent negatives include no chest pain, chills, fever, shortness of breath or wheezing.       Patient ID: Aimee Mcclain, female    DOB: 05-Sep-1956, 67 y.o.   MRN: [id number]  Virtual visit completed through [place], a video enabled telemedicine application. Due to national recommendations of social distancing due to COVID-19, a virtual visit is felt to be most appropriate for this patient at this time. Reviewed limitations, risks, security and privacy concerns of performing a virtual visit and the availability of in person appointments. I also reviewed that there may be a patient responsible charge related to this service. The patient agreed to proceed.   Patient location: home Provider location: [place] [facility] at [facility], office Persons participating in this virtual visit: patient, provider Allie Bossier, NP, Tinnie Gens, Student NP.  If any vitals were documented, they were collected by patient at home unless specified below.    BP 134/84 Comment: patient reported  Pulse (!) 106 Comment: patient reported  Temp 97.6 F (36.4 C) Comment: patient reported  SpO2 98% Comment: patient reported   CC: Positive home covid test Subjective:   HPI: Aimee Mcclain is a 67 y.o. female presenting on 12/22/2022 for Cough (Symptoms began Monday 12/21/22 Cough, Sore throat, headache /At home covid test this morning- positive )  Past medical history of breast cancer, hypertension, covid-19. Reports symptoms began yesterday with cough, sore throat, headache. She competed an at-home covid test this morning, which was positive. She took Dayquil and Nyquil  yesterday with some relief. Denies difficulty breathing, chest pain or shortness of breath. Denies any fever. Some muscle aches and body aches.   Her vital signs that she took at home are Pulse 106 SpO2 98 Temp 97.6 BP139/84  Relevant past medical, surgical, family and social history reviewed and updated as indicated. Interim medical history since our last visit reviewed. Allergies and medications reviewed and updated. Outpatient Medications Prior to Visit  Medication Sig Dispense Refill   acetaminophen (TYLENOL) 500 MG tablet Take 1,000 mg by mouth every 6 (six) hours as needed for moderate pain.     atorvastatin (LIPITOR) 20 MG tablet Take 1 tablet (20 mg total) by mouth daily. 90 tablet 3   Calcium Carbonate-Vitamin D (CALCIUM 600+D PO) Take 1 tablet by mouth daily.     Cholecalciferol (VITAMIN D3 GUMMIES PO) Take 1,000 Units by mouth daily.     Multiple Vitamin (MULTIVITAMIN) tablet Take 1 tablet by mouth daily.     omeprazole (PRILOSEC) 20 MG capsule Take 20 mg by mouth daily.     tamoxifen (NOLVADEX) 20 MG tablet Take 1 tablet (20 mg total) by mouth daily. 30 tablet 5   valACYclovir (VALTREX) 500 MG tablet Take 1 tablet (500 mg total) by mouth 2 (two) times daily as needed.     Vitamin B Complex-C CAPS Take 1 capsule by mouth.     No facility-administered medications prior to visit.     Per HPI unless specifically indicated in ROS section below Review of Systems  Constitutional:  Negative for chills and fever.  Respiratory:  Positive for cough. Negative for shortness of  breath and wheezing.   Cardiovascular:  Negative for chest pain.  Musculoskeletal:  Positive for myalgias.   Objective:  BP 134/84 Comment: patient reported  Pulse (!) 106 Comment: patient reported  Temp 97.6 F (36.4 C) Comment: patient reported  SpO2 98% Comment: patient reported  Wt Readings from Last 3 Encounters:  12/17/22 177 lb (80.3 kg)  11/11/22 177 lb (80.3 kg)  10/21/22 180 lb (81.6 kg)        Physical exam: General: Alert and oriented x 3, no distress, does not appear sickly  Pulmonary: Speaks in complete sentences without increased work of breathing, no cough during visit.  Psychiatric: Normal mood, thought content, and behavior.     Results for orders placed or performed in visit on 12/17/22  Comprehensive metabolic panel  Result Value Ref Range   Glucose 87 70 - 99 mg/dL   BUN 16 8 - 27 mg/dL   Creatinine, Ser 0.71 0.57 - 1.00 mg/dL   eGFR 94 >59 mL/min/1.73   BUN/Creatinine Ratio 23 12 - 28   Sodium 144 134 - 144 mmol/L   Potassium 4.8 3.5 - 5.2 mmol/L   Chloride 105 96 - 106 mmol/L   CO2 22 20 - 29 mmol/L   Calcium 9.7 8.7 - 10.3 mg/dL   Total Protein 6.8 6.0 - 8.5 g/dL   Albumin 4.6 3.9 - 4.9 g/dL   Globulin, Total 2.2 1.5 - 4.5 g/dL   Albumin/Globulin Ratio 2.1 1.2 - 2.2   Bilirubin Total 0.4 0.0 - 1.2 mg/dL   Alkaline Phosphatase 81 44 - 121 IU/L   AST 26 0 - 40 IU/L   ALT 25 0 - 32 IU/L  Hemoglobin A1c  Result Value Ref Range   Hgb A1c MFr Bld 5.4 4.8 - 5.6 %   Est. average glucose Bld gHb Est-mCnc 108 mg/dL  Insulin, random  Result Value Ref Range   INSULIN 7.9 2.6 - 24.9 uIU/mL  Lipid Panel With LDL/HDL Ratio  Result Value Ref Range   Cholesterol, Total 151 100 - 199 mg/dL   Triglycerides 66 0 - 149 mg/dL   HDL 64 >39 mg/dL   VLDL Cholesterol Cal 13 5 - 40 mg/dL   LDL Chol Calc (NIH) 74 0 - 99 mg/dL   LDL/HDL Ratio 1.2 0.0 - 3.2 ratio  VITAMIN D 25 Hydroxy (Vit-D Deficiency, Fractures)  Result Value Ref Range   Vit D, 25-Hydroxy 60.9 30.0 - 100.0 ng/mL   Assessment & Plan:   Problem List Items Addressed This Visit       Other   COVID-19 - Primary    Given her history of cancer, patient agreeable to treat with Paxlovid. She had Covid in June and was treated with Paxlovid and tolerated it well.   Discussed OTC meds for tylenol, dayquil or nyquil for symptom management.   Quarantine precautions provided. Ed precautions given.    Follow up with PCP if symptoms worsen or there is no improvement.       Relevant Medications   nirmatrelvir/ritonavir (PAXLOVID) 20 x 150 MG & 10 x '100MG'$  TABS     Meds ordered this encounter  Medications   nirmatrelvir/ritonavir (PAXLOVID) 20 x 150 MG & 10 x '100MG'$  TABS    Sig: Take 3 tablets by mouth 2 (two) times daily for 5 days. (Take nirmatrelvir 150 mg two tablets twice daily for 5 days and ritonavir 100 mg one tablet twice daily for 5 days) Patient GFR is 94 on 12/17/22.    Dispense:  30 tablet    Refill:  0   No orders of the defined types were placed in this encounter.   I discussed the assessment and treatment plan with the patient. The patient was provided an opportunity to ask questions and all were answered. The patient agreed with the plan and demonstrated an understanding of the instructions. The patient was advised to call back or seek an in-person evaluation if the symptoms worsen or if the condition fails to improve as anticipated.  Follow up plan:  No follow-ups on file.  Tinnie Gens, BSN-RN, DNP STUDENT

## 2022-12-22 NOTE — Patient Instructions (Signed)
Paxlovid prescription has been sent. Take 3 tablets by mouth two times a day for five days.   It is ok to continue nyquil/dayquil for symptom management. Ok to take tylenol as needed for bodyaches.   Please Quarantine for five days from the first day of your symptom.   Please go to the ER if you develop shortness of breath, chest pain or difficulty breathing.   Follow up with Dr. Damita Dunnings if your symptoms worsen or have no improvement.

## 2022-12-22 NOTE — Assessment & Plan Note (Addendum)
Patient qualifies for antiviral treatment.  Given her history of cancer, patient agreeable to treat with Paxlovid.   Discussed OTC meds for tylenol, dayquil or nyquil for symptom management.   Quarantine precautions provided. Ed precautions given.   Follow up with PCP if symptoms worsen or there is no improvement.   I evaluated patient, was consulted regarding treatment, and agree with assessment and plan per Tinnie Gens, RN, DNP student.   Allie Bossier, NP-C

## 2022-12-28 ENCOUNTER — Encounter: Payer: Self-pay | Admitting: Hematology

## 2022-12-28 NOTE — Progress Notes (Signed)
Chief Complaint:   OBESITY Aimee Mcclain is here to discuss her progress with her obesity treatment plan along with follow-up of her obesity related diagnoses. Aimee Mcclain is on keeping a food journal and adhering to recommended goals of 1550-1700 calories and 115+ grams of protein and states she is following her eating plan approximately 60-70% of the time. Aimee Mcclain states she is walking 30-45 minutes 7 times per week.  Today's visit was #: 6 Starting weight: 191 lbs Starting date: 08/12/2022 Today's weight: 177 lbs Today's date: 12/17/2022 Total lbs lost to date: 14 lbs Total lbs lost since last in-office visit: 0  Interim History: Aimee Mcclain had a busy December with travel, cooking, and get togethers.  She recognizes that she journaled but did not have as much control with choice or preparation.  She was not as consistent with exercise either.  Has a few trips planned to mountains otherwise will be working.  Subjective:   1. Essential hypertension Blood pressure elevated again today.  2. Other hyperlipidemia Last LDL increased.  HDL within normal limits.  On Lipitor.  3. Vitamin D deficiency Not on prescription Vit D, but taking over the counter.  Notes fatigue.  4. Hypoglycemia A1c within normal limits.  Prior Glucose 50s.  Assessment/Plan:   1. Essential hypertension We will obtain labs today.  - Comprehensive metabolic panel  2. Other hyperlipidemia We will obtain labs today.  - Lipid Panel With LDL/HDL Ratio  3. Vitamin D deficiency We will obtain labs today.  - VITAMIN D 25 Hydroxy (Vit-D Deficiency, Fractures)  4. Hypoglycemia We will obtain labs today.  - Hemoglobin A1c - Insulin, random  5. Obesity with current BMI of 27.8 Aimee Mcclain is currently in the action stage of change. As such, her goal is to continue with weight loss efforts. She has agreed to keeping a food journal and adhering to recommended goals of 1550-1700 calories and 115+ grams of protein daily.    Exercise goals: Older adults should follow the adult guidelines. When older adults cannot meet the adult guidelines, they should be as physically active as their abilities and conditions will allow.   Behavioral modification strategies: increasing lean protein intake, meal planning and cooking strategies, keeping healthy foods in the home, and planning for success.  Aimee Mcclain has agreed to follow-up with our clinic in 4 weeks. She was informed of the importance of frequent follow-up visits to maximize her success with intensive lifestyle modifications for her multiple health conditions.   Aimee Mcclain was informed we would discuss her lab results at her next visit unless there is a critical issue that needs to be addressed sooner. Aimee Mcclain agreed to keep her next visit at the agreed upon time to discuss these results.  Objective:   Blood pressure (!) 147/77, pulse 69, temperature (!) 97.4 F (36.3 C), height '5\' 7"'$  (1.702 m), weight 177 lb (80.3 kg), SpO2 100 %. Body mass index is 27.72 kg/m.  General: Cooperative, alert, well developed, in no acute distress. HEENT: Conjunctivae and lids unremarkable. Cardiovascular: Regular rhythm.  Lungs: Normal work of breathing. Neurologic: No focal deficits.   Lab Results  Component Value Date   CREATININE 0.71 12/17/2022   BUN 16 12/17/2022   NA 144 12/17/2022   K 4.8 12/17/2022   CL 105 12/17/2022   CO2 22 12/17/2022   Lab Results  Component Value Date   ALT 25 12/17/2022   AST 26 12/17/2022   ALKPHOS 81 12/17/2022   BILITOT 0.4 12/17/2022   Lab Results  Component Value Date   HGBA1C 5.4 12/17/2022   HGBA1C 5.3 08/12/2022   HGBA1C 5.2 08/21/2020   Lab Results  Component Value Date   INSULIN 7.9 12/17/2022   INSULIN 13.0 08/12/2022   Lab Results  Component Value Date   TSH 1.800 08/12/2022   Lab Results  Component Value Date   CHOL 151 12/17/2022   HDL 64 12/17/2022   LDLCALC 74 12/17/2022   TRIG 66 12/17/2022   CHOLHDL 4  02/12/2020   Lab Results  Component Value Date   VD25OH 60.9 12/17/2022   VD25OH 40.0 08/12/2022   Lab Results  Component Value Date   WBC 4.8 10/07/2022   HGB 14.1 10/07/2022   HCT 41.5 10/07/2022   MCV 89.6 10/07/2022   PLT 156 10/07/2022   No results found for: "IRON", "TIBC", "FERRITIN"  Attestation Statements:   Reviewed by clinician on day of visit: allergies, medications, problem list, medical history, surgical history, family history, social history, and previous encounter notes.  I, Elnora Morrison, RMA am acting as transcriptionist for Coralie Common, MD.  I have reviewed the above documentation for accuracy and completeness, and I agree with the above. - Coralie Common, MD

## 2023-01-04 ENCOUNTER — Ambulatory Visit: Payer: Medicare Other | Attending: General Surgery

## 2023-01-04 VITALS — Wt 182.5 lb

## 2023-01-04 DIAGNOSIS — Z483 Aftercare following surgery for neoplasm: Secondary | ICD-10-CM | POA: Insufficient documentation

## 2023-01-04 NOTE — Therapy (Signed)
OUTPATIENT PHYSICAL THERAPY SOZO SCREENING NOTE   Patient Name: Aimee Mcclain MRN: [id number] DOB:25-Nov-1956, 67 y.o., female Today's Date: 01/04/2023  PCP: [name], MD REFERRING PROVIDER: Stark Klein, MD   PT End of Session - 01/04/23 1041     Visit Number 3   # unchanged due to screen only   PT Start Time 1039    PT Stop Time 1044    PT Time Calculation (min) 5 min    Activity Tolerance Patient tolerated treatment well    Behavior During Therapy [facility] for tasks assessed/performed             Past Medical History:  Diagnosis Date   Arthritis    Breast cancer ([place])    Cataract    Colon polyps    Endometriosis    Family history of colon cancer    Family history of colonic polyps    Fuchs' corneal dystrophy    GERD (gastroesophageal reflux disease)    H/O bilateral breast implants    Heartburn    High blood pressure    History of colon polyps    HPV in female    HSV (herpes simplex virus) infection    Migraine    no longer having migraines   Osteoarthritis    Osteopenia    Port-A-Cath in place 03/18/2021   S/P TAH (total abdominal hysterectomy) 1991   Vitamin D deficiency    Past Surgical History:  Procedure Laterality Date   ABDOMINAL HYSTERECTOMY     BREAST BIOPSY     Benign.   BREAST ENHANCEMENT SURGERY     BREAST LUMPECTOMY Bilateral 1993 and 1995   BREAST LUMPECTOMY WITH RADIOACTIVE SEED AND SENTINEL LYMPH NODE BIOPSY Left 02/13/2021   Procedure: LEFT BREAST LUMPECTOMY WITH RADIOACTIVE SEED AND SENTINEL LYMPH NODE BIOPSY WITH SEED TARGETED NODE;  Surgeon: Stark Klein, MD;  Location: [place];  Service: General;  Laterality: Left;   CATARACT EXTRACTION, BILATERAL     CESAREAN SECTION     PARTIAL HYSTERECTOMY     PLACEMENT OF BREAST IMPLANTS  1993   PORT-A-CATH REMOVAL Right 11/04/2021   Procedure: REMOVAL PORT-A-CATH;  Surgeon: Stark Klein, MD;  Location: [place];  Service: General;  Laterality: Right;   PORTACATH  PLACEMENT Right 02/13/2021   Procedure: INSERTION PORT-A-CATH;  Surgeon: Stark Klein, MD;  Location: [place];  Service: General;  Laterality: Right;   TONSILLECTOMY     WISDOM TOOTH EXTRACTION     Patient Active Problem List   Diagnosis Date Noted   COVID-19 05/28/2022   Paronychia of great toe, right 01/16/2022   Pain of right great toe 01/16/2022   Loose toenail 01/16/2022   Leukopenia 12/17/2021   Exposure to COVID-19 virus 12/17/2021   HTN (hypertension) 06/22/2021   Herpes simplex 06/19/2021   Osteopenia 06/19/2021   Medicare welcome exam 05/21/2021   Malignant neoplasm of upper-inner quadrant of left breast in female, estrogen receptor positive ([place]) 01/16/2021   Chronic migraine 08/21/2020   Screening mammogram, encounter for 06/30/2020   Paresthesia 06/09/2020   Neuropathy 02/14/2020   Atypical chest pain 02/14/2020   Healthcare maintenance 02/14/2020   Migraine    Advance care planning 01/16/2019   Family history of colon cancer     REFERRING DIAG: left breast cancer at risk for lymphedema  THERAPY DIAG: Aftercare following surgery for neoplasm  PERTINENT HISTORY: Patient was diagnosed on 01/02/2021 with 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 Ki67 is  30%. Patient underwent a left lumpectomy and sentinel node biopsy (4 negative nodes) on 02/13/2021. Radiation complete 07/28/21   PRECAUTIONS: left UE Lymphedema risk, None  SUBJECTIVE: Pt returns for her 3 month L-Dex screen.   PAIN:  Are you having pain? No  SOZO SCREENING: Patient was assessed today using the SOZO machine to determine the lymphedema index score. This was compared to her baseline score. It was determined that she is within the recommended range when compared to her baseline and no further action is needed at this time. She will continue SOZO screenings. These are done every 3 months for 2 years post operatively followed by every 6 months for 2 years,  and then annually.   L-DEX FLOWSHEETS - 01/04/23 1000       L-DEX LYMPHEDEMA SCREENING   Measurement Type Unilateral    L-DEX MEASUREMENT EXTREMITY Upper Extremity    POSITION  Standing    DOMINANT SIDE Right    At Risk Side Left    BASELINE SCORE (UNILATERAL) 2.6    L-DEX SCORE (UNILATERAL) -2.2    VALUE CHANGE (UNILAT) -4.8              Otelia Limes, PTA 01/04/2023, 10:42 AM

## 2023-01-07 ENCOUNTER — Inpatient Hospital Stay: Payer: Medicare Other | Admitting: Hematology

## 2023-01-07 ENCOUNTER — Telehealth: Payer: Self-pay

## 2023-01-07 ENCOUNTER — Inpatient Hospital Stay: Payer: Medicare Other | Attending: Adult Health | Admitting: Adult Health

## 2023-01-07 ENCOUNTER — Telehealth: Payer: Medicare Other | Admitting: Hematology

## 2023-01-07 ENCOUNTER — Ambulatory Visit (HOSPITAL_COMMUNITY)
Admission: RE | Admit: 2023-01-07 | Discharge: 2023-01-07 | Disposition: A | Discharge status: Home / Self Care | Payer: Medicare Other | Source: Ambulatory Visit | Attending: Adult Health | Admitting: Adult Health

## 2023-01-07 ENCOUNTER — Other Ambulatory Visit: Payer: Self-pay | Admitting: Adult Health

## 2023-01-07 ENCOUNTER — Encounter: Payer: Self-pay | Admitting: Adult Health

## 2023-01-07 VITALS — BP 142/80 | HR 69 | Temp 97.0°F | Resp 17 | Wt 185.2 lb

## 2023-01-07 DIAGNOSIS — M546 Pain in thoracic spine: Secondary | ICD-10-CM

## 2023-01-07 DIAGNOSIS — Z170 Estrogen receptor positive status [ER+]: Secondary | ICD-10-CM | POA: Insufficient documentation

## 2023-01-07 DIAGNOSIS — C50212 Malignant neoplasm of upper-inner quadrant of left female breast: Secondary | ICD-10-CM | POA: Insufficient documentation

## 2023-01-07 DIAGNOSIS — Z79810 Long term (current) use of selective estrogen receptor modulators (SERMs): Secondary | ICD-10-CM | POA: Diagnosis not present

## 2023-01-07 NOTE — Telephone Encounter (Signed)
-----  Message from Gardenia Phlegm, NP sent at 01/07/2023  3:31 PM EST ----- Xray does not pinpoint an etiology for the level of her pain, I recommend she undergo MRI  ----- Message ----- From: Interface, Rad Results In Sent: 01/07/2023   3:09 PM EST To: Gardenia Phlegm, NP

## 2023-01-07 NOTE — Progress Notes (Signed)
[facility] Follow up:    [name], MD [place] [address]   DIAGNOSIS:  Cancer Staging  Malignant neoplasm of upper-inner quadrant of left breast in female, estrogen receptor positive ([place]) Staging form: Breast, AJCC 8th Edition - Clinical stage from 01/14/2020: Stage IB (cT1c, cN1, cM0, G3, ER+, PR+, HER2+) - Signed by Truitt Merle, MD on 01/21/2021 Stage prefix: Initial diagnosis - Pathologic stage from 02/13/2021: Stage IA (pT2, pN0, cM0, G3, ER+, PR+, HER2+) - Signed by Heilingoetter, Cassandra L, PA-C on 03/07/2021 Stage prefix: Initial diagnosis Nuclear grade: G3 Histologic grading system: 3 grade system   SUMMARY OF ONCOLOGIC HISTORY: Oncology History Overview Note  Cancer Staging Malignant neoplasm of upper-inner quadrant of left breast in female, estrogen receptor positive ([place]) Staging form: Breast, AJCC 8th Edition - Clinical stage from 01/14/2020: Stage IB (cT1c, cN1, cM0, G3, ER+, PR+, HER2+) - Signed by Truitt Merle, MD on 01/21/2021 Stage prefix: Initial diagnosis - Pathologic stage from 02/13/2021: Stage IA (pT2, pN0, cM0, G3, ER+, PR+, HER2+) - Signed by Heilingoetter, Cassandra L, PA-C on 03/07/2021 Stage prefix: Initial diagnosis Nuclear grade: G3 Histologic grading system: 3 grade system    Malignant neoplasm of upper-inner quadrant of left breast in female, estrogen receptor positive ([place])  01/14/2020 Cancer Staging   Staging form: Breast, AJCC 8th Edition - Clinical stage from 01/14/2020: Stage IB (cT1c, cN1, cM0, G3, ER+, PR+, HER2+) - Signed by Truitt Merle, MD on 01/21/2021 Stage prefix: Initial diagnosis   01/02/2021 Mammogram   Left breast with 1.9x1x1.8 cm complex cyst at 12:00 position, 4cmfn, versus saloid mass with calcifications, irregulr, increased in size. Biopsy recommended.    01/13/2021 Initial Biopsy   Diagnosis 1. Breast, left, needle core biopsy, 11 o'clock, 4cmfn - INVASIVE DUCTAL CARCINOMA - SEE  COMMENT 2. Lymph node, needle/core biopsy, left axilla - FIBROVASCULAR AND ADIPOSE TISSUE - NO LYMPHOID TISSUE OR CARCINOMA IDENTIFIED Microscopic Comment 1. Based on the biopsy, the carcinoma appears Nottingham grade 3 of 3 and measures 1.2 cm in greatest linear extent. Prognostic markers (ER/PR/ki-67/HER2) are pending and will be reported in an addendum. Dr. Jeannie Done reviewed the case and agrees with the above diagnosis. These results were called to [facility] on January 14, 2021   01/13/2021 Receptors her2   1. PROGNOSTIC INDICATORS Results: IMMUNOHISTOCHEMICAL AND MORPHOMETRIC ANALYSIS PERFORMED MANUALLY The tumor cells are POSITIVE for Her2 (3+). Estrogen Receptor: 95%, POSITIVE, STRONG STAINING INTENSITY Progesterone Receptor: 85%, POSITIVE, STRONG STAINING INTENSITY Proliferation Marker Ki67: 30%   01/16/2021 Initial Diagnosis   Malignant neoplasm of upper-inner quadrant of left breast in female, estrogen receptor positive ([place])   01/23/2021 Imaging   MRI Breast  IMPRESSION: 1. Known malignancy measuring 2.9 centimeters in the UPPER INNER QUADRANT of the LEFT breast. There is no evidence for involvement of the pectoralis muscle or implant. 2. No evidence for lymphadenopathy. 3. RIGHT breast is negative. 4. Bilateral retropectoral saline implants.   02/09/2021 Imaging   MRI Brain IMPRESSION: No evidence of intracranial metastatic disease.   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     02/13/2021 Surgery   LEFT BREAST LUMPECTOMY WITH RADIOACTIVE SEED AND SENTINEL LYMPH NODE BIOPSY WITH SEED TARGETED NODE and PAC Placed by Dr Barry Dienes   02/13/2021 Pathology Results   FINAL MICROSCOPIC DIAGNOSIS:   A. BREAST, LEFT, LUMPECTOMY:  - Invasive ductal carcinoma, 2.6 cm, Nottingham grade 3 of 3.  - Ductal carcinoma in situ, high grade with central necrosis.  -  Margins of resection:       - Invasive carcinoma broadly involves the anterior and inferior  margins.  Invasive  carcinoma is focally < 1 mm from each the superior  and medial margins.       - DCIS is focally < 1 mm from the inferior margin.  DCIS is focally  1 mm from each the anterior and superior margins.  DCIS is focally 1-2  mm from the medial margin.  - Biopsy clip.  - See oncology table.   B. BREAST, LEFT, ANTERIOR TISSUE, EXCISION:  - Residual invasive and in-situ ductal carcinoma.  - Residual invasive ductal carcinoma broadly involves the posterior  margin.  - Residual DCIS is focally 1 mm from the posterior margin.   C. BREAST, LEFT, INFERIOR MARGIN, EXCISION:  - No carcinoma identified.   D. SENTINEL LYMPH NODE, LEFT AXILLARY #1, BIOPSY:  - No carcinoma identified in one lymph node (0/1).   E. SENTINEL LYMPH NODE, LEFT AXILLARY #2, BIOPSY:  - Biopsy site.  - No distinct nodal tissue identified.  - No carcinoma identified.   F. SENTINEL LYMPH NODE, LEFT AXILLARY #3, BIOPSY:  - No carcinoma identified in one lymph node (0/1).   G. SENTINEL LYMPH NODE, LEFT AXILLARY #4, BIOPSY:  - No carcinoma identified in one lymph node (0/1).    02/13/2021 Cancer Staging   Staging form: Breast, AJCC 8th Edition - Pathologic stage from 02/13/2021: Stage IA (pT2, pN0, cM0, G3, ER+, PR+, HER2+) - Signed by Heilingoetter, Cassandra L, PA-C on 03/07/2021 Stage prefix: Initial diagnosis Nuclear grade: G3 Histologic grading system: 3 grade system   03/18/2021 - 02/19/2022 Chemotherapy   Patient is on Treatment Plan : BREAST Paclitaxel + Trastuzumab q7d / Trastuzumab q21d     06/2021 - 07/2021 Radiation Therapy   Adjuvant radiation per Dr. Isidore Moos   08/20/2021 Imaging   CT Chest w/o contrast  IMPRESSION: No definite evidence of pulmonary metastatic disease.   5 mm indeterminate right lower lobe pulmonary nodule. Comparison with prior examinations, if available, is recommended to determine chronicity. No follow-up needed if patient is low-risk. In absence of priors, Non-contrast chest CT can be considered  in 12 months if patient is high-risk. This recommendation follows the consensus statement: Guidelines for Management of Incidental Pulmonary Nodules Detected on CT Images: From the Fleischner Society 2017; Radiology 2017; 284:228-243.   Mild coronary artery calcification.   08/20/2021 Imaging   CT Head w/o contrast  IMPRESSION: No acute abnormality. No evidence of intracranial metastatic disease.   08/2021 -  Anti-estrogen oral therapy   Began adjuvant exemestane; changed to tamoxifen 02/2022   11/13/2021 Survivorship   SCP delivered by [name], NP     CURRENT THERAPY: Tamoxifen  INTERVAL HISTORY: Shree Espey [place] 67 y.o. female returns for follow-up of her history of breast cancer.  She continues on tamoxifen with good tolerance.  Her most recent mammogram occurred on July 27, 2022 demonstrating no mammographic evidence of malignancy and breast density category C.  She has midline thoracic back pain that is constant and worsening.  She first noticed it before her October visit with [place].  She cannot pinpoint a date. Moving will make her pain worse such as cleaning.  It doesn't bother her at night or when she first wakes up in the morning.  She says sometimes she feels as if an arrow is sticking into her back.  She only takes tylenol for her pain and this decreases its severity, it does not  completely resolve the pain.    She also notes "knots" in her arms and the palm of her hand that have recently emerged.  These areas will be larger and then they will decrease in size.  They are soft and more difficult to feel today than they were previously.  Patient Active Problem List   Diagnosis Date Noted   Non-alcoholic fatty liver disease 06/24/2022   Endometriosis 06/24/2022   COVID-19 05/28/2022   Paronychia of great toe, right 01/16/2022   Pain of right great toe 01/16/2022   Loose toenail 01/16/2022   Leukopenia 12/17/2021   Exposure to COVID-19 virus 12/17/2021   HTN  (hypertension) 06/22/2021   Herpes simplex 06/19/2021   Osteopenia 06/19/2021   Medicare welcome exam 05/21/2021   Malignant neoplasm of upper-inner quadrant of left breast in female, estrogen receptor positive ([place]) 01/16/2021   Chronic migraine 08/21/2020   Screening mammogram, encounter for 06/30/2020   Paresthesia 06/09/2020   Neuropathy 02/14/2020   Atypical chest pain 02/14/2020   Healthcare maintenance 02/14/2020   Migraine    Advance care planning 01/16/2019   Family history of colon cancer     is allergic to ibuprofen, omnicef [cefdinir], proton pump inhibitors, roxicodone [oxycodone hcl], and vioxx [rofecoxib].  MEDICAL HISTORY: Past Medical History:  Diagnosis Date   Arthritis    Breast cancer ([place])    Cataract    Colon polyps    Endometriosis    Family history of colon cancer    Family history of colonic polyps    Fuchs' corneal dystrophy    GERD (gastroesophageal reflux disease)    H/O bilateral breast implants    Heartburn    High blood pressure    History of colon polyps    HPV in female    HSV (herpes simplex virus) infection    Migraine    no longer having migraines   Osteoarthritis    Osteopenia    Port-A-Cath in place 03/18/2021   S/P TAH (total abdominal hysterectomy) 1991   Vitamin D deficiency     SURGICAL HISTORY: Past Surgical History:  Procedure Laterality Date   ABDOMINAL HYSTERECTOMY     BREAST BIOPSY     Benign.   BREAST ENHANCEMENT SURGERY     BREAST LUMPECTOMY Bilateral 1993 and 1995   BREAST LUMPECTOMY WITH RADIOACTIVE SEED AND SENTINEL LYMPH NODE BIOPSY Left 02/13/2021   Procedure: LEFT BREAST LUMPECTOMY WITH RADIOACTIVE SEED AND SENTINEL LYMPH NODE BIOPSY WITH SEED TARGETED NODE;  Surgeon: Stark Klein, MD;  Location: [facility];  Service: General;  Laterality: Left;   CATARACT EXTRACTION, BILATERAL     CESAREAN SECTION     PARTIAL HYSTERECTOMY     PLACEMENT OF BREAST IMPLANTS  1993   PORT-A-CATH REMOVAL Right  11/04/2021   Procedure: REMOVAL PORT-A-CATH;  Surgeon: Stark Klein, MD;  Location: [facility];  Service: General;  Laterality: Right;   PORTACATH PLACEMENT Right 02/13/2021   Procedure: INSERTION PORT-A-CATH;  Surgeon: Stark Klein, MD;  Location: [facility];  Service: General;  Laterality: Right;   TONSILLECTOMY     WISDOM TOOTH EXTRACTION      SOCIAL HISTORY: Social History   Socioeconomic History   Marital status: Married    Spouse name: Not on file   Number of children: 2   Years of education: some college   Highest education level: Not on file  Occupational History   Occupation: Accountant  Tobacco Use   Smoking status: Former    Packs/day: 0.50  Years: 10.00    Total pack years: 5.00    Types: Cigarettes    Quit date: 12/14/1993    Years since quitting: 29.0   Smokeless tobacco: Never  Vaping Use   Vaping Use: Never used  Substance and Sexual Activity   Alcohol use: Yes    Comment: very rare   Drug use: No   Sexual activity: Yes    Comment: hyst  Other Topics Concern   Not on file  Social History Narrative   Lives at home with husband.   Married 1988.   Right-handed.   Social Determinants of Health   Financial Resource Strain: Low Risk  (05/21/2022)   Overall Financial Resource Strain (CARDIA)    Difficulty of Paying Living Expenses: Not hard at all  Food Insecurity: No Food Insecurity (05/21/2022)   Hunger Vital Sign    Worried About Running Out of Food in the Last Year: Never true    Ran Out of Food in the Last Year: Never true  Transportation Needs: No Transportation Needs (05/21/2022)   PRAPARE - Hydrologist (Medical): No    Lack of Transportation (Non-Medical): No  Physical Activity: Sufficiently Active (05/21/2022)   Exercise Vital Sign    Days of Exercise per Week: 3 days    Minutes of Exercise per Session: 60 min  Stress: No Stress Concern Present (05/21/2022)   [place]    Feeling of Stress : Not at all  Social Connections: Moderately Integrated (05/21/2022)   Social Connection and Isolation Panel [NHANES]    Frequency of Communication with Friends and Family: More than three times a week    Frequency of Social Gatherings with Friends and Family: Once a week    Attends Religious Services: Never    Marine scientist or Organizations: Yes    Attends Music therapist: More than 4 times per year    Marital Status: Married  Human resources officer Violence: Not At Risk (05/21/2022)   Humiliation, Afraid, Rape, and Kick questionnaire    Fear of Current or Ex-Partner: No    Emotionally Abused: No    Physically Abused: No    Sexually Abused: No    FAMILY HISTORY: Family History  Problem Relation Age of Onset   Dementia Maternal Grandmother    Heart disease Maternal Grandfather    Colon cancer Maternal Grandfather        dx over 41   Colon cancer Father        d. 40   Heart disease Mother    Colon cancer Mother        dx late 9s-70s   Rectal cancer Mother 60   Diabetes Brother    Colon polyps Brother    Diabetes Brother    Colon polyps Brother    Lymphoma Maternal Aunt    Heart disease Maternal Uncle    Crohn's disease Daughter    Esophageal cancer Neg Hx    Stomach cancer Neg Hx    Breast cancer Neg Hx     Review of Systems  Constitutional:  Negative for appetite change, chills, fatigue, fever and unexpected weight change.  HENT:   Negative for hearing loss, lump/mass and trouble swallowing.   Eyes:  Negative for eye problems and icterus.  Respiratory:  Negative for chest tightness, cough and shortness of breath.   Cardiovascular:  Negative for chest pain, leg swelling and palpitations.  Gastrointestinal:  Negative  for abdominal distention, abdominal pain, constipation, diarrhea, nausea and vomiting.  Endocrine: Negative for hot flashes.  Genitourinary:  Negative for difficulty urinating.    Musculoskeletal:  Negative for arthralgias.  Skin:  Negative for itching and rash.  Neurological:  Negative for dizziness, extremity weakness, headaches and numbness.  Hematological:  Negative for adenopathy. Does not bruise/bleed easily.  Psychiatric/Behavioral:  Negative for depression. The patient is not nervous/anxious.       PHYSICAL EXAMINATION  ECOG PERFORMANCE STATUS: 1 - Symptomatic but completely ambulatory  Vitals:   01/07/23 1348  BP: (!) 142/80  Pulse: 69  Resp: 17  Temp: (!) 97 F (36.1 C)  SpO2: 100%    Physical Exam Constitutional:      General: She is not in acute distress.    Appearance: Normal appearance. She is not toxic-appearing.  HENT:     Head: Normocephalic and atraumatic.  Eyes:     General: No scleral icterus. Cardiovascular:     Rate and Rhythm: Normal rate and regular rhythm.     Pulses: Normal pulses.     Heart sounds: Normal heart sounds.  Pulmonary:     Effort: Pulmonary effort is normal.     Breath sounds: Normal breath sounds.  Abdominal:     General: Abdomen is flat. Bowel sounds are normal. There is no distension.     Palpations: Abdomen is soft.     Tenderness: There is no abdominal tenderness.  Musculoskeletal:        General: No swelling or tenderness (No thoracic spine tenderness to palpation).     Cervical back: Neck supple.  Lymphadenopathy:     Cervical: No cervical adenopathy.  Skin:    General: Skin is warm and dry.     Findings: No rash.  Neurological:     General: No focal deficit present.     Mental Status: She is alert.  Psychiatric:        Mood and Affect: Mood normal.        Behavior: Behavior normal.     LABORATORY DATA: None for this visit   ASSESSMENT and THERAPY PLAN:   Malignant neoplasm of upper-inner quadrant of left breast in female, estrogen receptor positive ([place]) Aimee Mcclain is a 67 year old woman with history of stage Ia triple positive left-sided breast cancer diagnosed in January 2022 status  postlumpectomy, adjuvant chemotherapy, maintenance trastuzumab, adjuvant radiation and antiestrogen therapy which began as exemestane in September 2022 but changed to tamoxifen in March 2023.  Kateria has persistent thoracic back pain that we need to evaluate and get to the bottom of.  I ordered plain films of the spine since those can be completed today.  I recommended that she continue taking Tylenol for her pain.  Should we need to obtain a spine MRI we can certainly do so.  I also reassured her about the soft "knot" areas on her skin and how the fact that they are decreasing in size and are soft is a good sign.  I recommended she keep an eye on these and let us know if the enlarge at all in the future.  For her tiredness this could be a latent effects of her COVID-19 virus it also can be a side effect from her tamoxifen.  I recommended that she give it a few more weeks and if the tiredness still was not improved she can consider stopping the tamoxifen for a couple of weeks to determine if the fatigue improves.  Daune will think about this for  now.    We will follow-up with [place] based on the results of her x-ray with next steps.  Total encounter time:20 minutes*in face-to-face visit time, chart review, lab review, care coordination, order entry, and documentation of the encounter time.  Wilber Bihari, NP 01/07/23 2:56 PM Medical Oncology and Hematology [facility] [place], [place] [address] Tel. [phone]    Fax. [phone]  *Total Encounter Time as defined by the Centers for Medicare and Medicaid Services includes, in addition to the face-to-face time of a patient visit (documented in the note above) non-face-to-face time: obtaining and reviewing outside history, ordering and reviewing medications, tests or procedures, care coordination (communications with other health care professionals or caregivers) and documentation in the medical record.

## 2023-01-07 NOTE — Telephone Encounter (Signed)
Called Pt regarding MRI. Pt scheduled for 2/1 at 1430. Pt verbalized understanding.

## 2023-01-07 NOTE — Telephone Encounter (Signed)
Called Pt and given below message. Pt agreeable to MRI recommended by NP. Will schedule once ordered.

## 2023-01-07 NOTE — Progress Notes (Signed)
Xray of thoracic spine does not show any etiology for the level of pain Ellora is experiencing.  I ordered MRI w/wo contrast of the thoracic spine to evaluate in further detail.   Wilber Bihari, NP 01/07/23 3:52 PM Medical Oncology and Hematology [facility] [place], [place] [address] Tel. [phone]    Fax. [phone]

## 2023-01-07 NOTE — Assessment & Plan Note (Addendum)
Aimee Mcclain is a 67 year old woman with history of stage Ia triple positive left-sided breast cancer diagnosed in January 2022 status postlumpectomy, adjuvant chemotherapy, maintenance trastuzumab, adjuvant radiation and antiestrogen therapy which began as exemestane in September 2022 but changed to tamoxifen in March 2023.  Aimee Mcclain has persistent thoracic back pain that we need to evaluate and get to the bottom of.  I ordered plain films of the spine since those can be completed today.  I recommended that she continue taking Tylenol for her pain.  Should we need to obtain a spine MRI we can certainly do so.  I also reassured her about the soft "knot" areas on her skin and how the fact that they are decreasing in size and are soft is a good sign.  I recommended she keep an eye on these and let us know if the enlarge at all in the future.  For her tiredness this could be a latent effects of her COVID-19 virus it also can be a side effect from her tamoxifen.  I recommended that she give it a few more weeks and if the tiredness still was not improved she can consider stopping the tamoxifen for a couple of weeks to determine if the fatigue improves.  Aimee Mcclain will think about this for now.    We will follow-up with [place] based on the results of her x-ray with next steps.

## 2023-01-11 ENCOUNTER — Encounter: Payer: Self-pay | Admitting: Hematology

## 2023-01-12 ENCOUNTER — Other Ambulatory Visit: Payer: Self-pay | Admitting: Radiology

## 2023-01-13 ENCOUNTER — Encounter: Payer: Self-pay | Admitting: Internal Medicine

## 2023-01-13 ENCOUNTER — Ambulatory Visit (INDEPENDENT_AMBULATORY_CARE_PROVIDER_SITE_OTHER): Payer: Medicare Other | Admitting: Internal Medicine

## 2023-01-13 VITALS — BP 138/74 | HR 66 | Ht 67.0 in | Wt 185.0 lb

## 2023-01-13 DIAGNOSIS — M546 Pain in thoracic spine: Secondary | ICD-10-CM | POA: Diagnosis present

## 2023-01-13 DIAGNOSIS — Z170 Estrogen receptor positive status [ER+]: Secondary | ICD-10-CM | POA: Diagnosis present

## 2023-01-13 DIAGNOSIS — R9431 Abnormal electrocardiogram [ECG] [EKG]: Secondary | ICD-10-CM | POA: Diagnosis present

## 2023-01-13 DIAGNOSIS — C50212 Malignant neoplasm of upper-inner quadrant of left female breast: Secondary | ICD-10-CM | POA: Diagnosis not present

## 2023-01-13 DIAGNOSIS — R0609 Other forms of dyspnea: Secondary | ICD-10-CM

## 2023-01-13 NOTE — Patient Instructions (Signed)
Medication Instructions:  No changes *If you need a refill on your cardiac medications before your next appointment, please call your pharmacy*  Follow-Up: At [facility], you and your health needs are our priority.  As part of our continuing mission to provide you with exceptional heart care, we have created designated Provider Care Teams.  These Care Teams include your primary Cardiologist (physician) and Advanced Practice Providers (APPs -  Physician Assistants and Nurse Practitioners) who all work together to provide you with the care you need, when you need it.  We recommend signing up for the patient portal called "MyChart".  Sign up information is provided on this After Visit Summary.  MyChart is used to connect with patients for Virtual Visits (Telemedicine).  Patients are able to view lab/test results, encounter notes, upcoming appointments, etc.  Non-urgent messages can be sent to your provider as well.   To learn more about what you can do with MyChart, go to [email or web address].    Your next appointment:   1 year(s)  Provider:   Janina Mayo, MD

## 2023-01-13 NOTE — Progress Notes (Signed)
Cardiology Office Note:    Date:  01/13/2023   ID:  Aimee Mcclain, DOB Apr 29, 1956, MRN [id number]  PCP:  [name], MD   [facility] Cardiologist:  Janina Mayo, MD     Referring MD: [name], MD   Chief Complaint  Patient presents with   Follow-up    6 months.   Shortness of Breath   Chest Pain  Mild Dyspnea/CAC  History of Present Illness:    Aimee Mcclain is a 67 y.o. female with a hx of breast cancer details below, here with dyspnea  She notes that when she exerts herself in the gym she can feel like she could pass out and dizzy. With daily activity she feels on fine. She can carry groceries and go up stairs without significant DOE or chest pressure. She does have mild dyspnea.  She had some leg pain and she feels better exemestane and this was changed to tamoxifen.   She has no cardiac disease hx. Mother has afib. Her maternal grandfather and uncle had heart disease. Her father had MI at 54.  She smoked for 10 years  Her CT showed mild CAC.  The 10-year ASCVD risk score (Arnett DK, et al., 2019) is: 5.9%   Values used to calculate the score:     Age: 74 years     Sex: Female     Is Non-Hispanic African American: No     Diabetic: No     Tobacco smoker: No     Systolic Blood Pressure: 387 mmHg     Is BP treated: No     HDL Cholesterol: 64 mg/dL     Total Cholesterol: 151 mg/dL  Interim Hx 01/13/2023 She presents today doing well; however has recently had a biopsy of her R breast. The results are pending. Obtained a CAC on her prior visit. It showed CAC 65th percentile. LDL was > 100 mg/dL, started lipitor. Otherwise, she has not worked out in [employer] as much. She was sick with COVID for awhile   Breast Cancer Hx: 01/13/2021- Stage1B; ER/PR+; HER2+. Invasive ductal carcinoma. Upper quadrant of the L breast. 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 Strain -21. TTE 08/06/2021- normal fxn, no strain TTE 11/13/2021- GLS -19 TTE 03/20/2022- GLS - 22  Past Medical History:  Diagnosis Date   Arthritis    Breast cancer ([place])    Cataract    Colon polyps    Endometriosis    Family history of colon cancer    Family history of colonic polyps    Fuchs' corneal dystrophy    GERD (gastroesophageal reflux disease)    H/O bilateral breast implants    Heartburn    High blood pressure    History of colon polyps    HPV in female    HSV (herpes simplex virus) infection    Migraine    no longer having migraines   Osteoarthritis    Osteopenia    Port-A-Cath in place 03/18/2021   S/P TAH (total abdominal hysterectomy) 1991   Vitamin D deficiency     Past Surgical History:  Procedure Laterality Date   ABDOMINAL HYSTERECTOMY  BREAST BIOPSY     Benign.   BREAST ENHANCEMENT SURGERY     BREAST LUMPECTOMY Bilateral 1993 and 1995   BREAST LUMPECTOMY WITH RADIOACTIVE SEED AND SENTINEL LYMPH NODE BIOPSY Left 02/13/2021   Procedure: LEFT BREAST LUMPECTOMY WITH RADIOACTIVE SEED AND SENTINEL LYMPH NODE BIOPSY WITH SEED TARGETED NODE;  Surgeon: Stark Klein, MD;  Location: [facility];  Service: General;  Laterality: Left;   CATARACT EXTRACTION, BILATERAL     CESAREAN SECTION     PARTIAL HYSTERECTOMY     PLACEMENT OF BREAST IMPLANTS  1993   PORT-A-CATH REMOVAL Right 11/04/2021   Procedure: REMOVAL PORT-A-CATH;  Surgeon: Stark Klein, MD;  Location: [facility];  Service: General;  Laterality: Right;   PORTACATH PLACEMENT Right 02/13/2021   Procedure: INSERTION PORT-A-CATH;  Surgeon: Stark Klein, MD;  Location: [facility];  Service: General;  Laterality: Right;    TONSILLECTOMY     WISDOM TOOTH EXTRACTION      Current Medications: Current Meds  Medication Sig   acetaminophen (TYLENOL) 500 MG tablet Take 1,000 mg by mouth every 6 (six) hours as needed for moderate pain.   atorvastatin (LIPITOR) 20 MG tablet Take 1 tablet (20 mg total) by mouth daily.   Calcium Carbonate-Vitamin D (CALCIUM 600+D PO) Take 1 tablet by mouth daily.   Cholecalciferol 1.25 MG (50000 UT) capsule Take 50,000 Units by mouth once a week.   Multiple Vitamin (MULTIVITAMIN) tablet Take 1 tablet by mouth daily.   omeprazole (PRILOSEC) 20 MG capsule Take 20 mg by mouth daily.   tamoxifen (NOLVADEX) 20 MG tablet Take 1 tablet (20 mg total) by mouth daily.   valACYclovir (VALTREX) 500 MG tablet Take 1 tablet (500 mg total) by mouth 2 (two) times daily as needed.   Vitamin B Complex-C CAPS Take 1 capsule by mouth.     Allergies:   Ibuprofen, Omnicef [cefdinir], Proton pump inhibitors, Roxicodone [oxycodone hcl], and Vioxx [rofecoxib]   Social History   Socioeconomic History   Marital status: Married    Spouse name: Not on file   Number of children: 2   Years of education: some college   Highest education level: Not on file  Occupational History   Occupation: Accountant  Tobacco Use   Smoking status: Former    Packs/day: 0.50    Years: 10.00    Total pack years: 5.00    Types: Cigarettes    Quit date: 12/14/1993    Years since quitting: 29.1   Smokeless tobacco: Never  Vaping Use   Vaping Use: Never used  Substance and Sexual Activity   Alcohol use: Yes    Comment: very rare   Drug use: No   Sexual activity: Yes    Comment: hyst  Other Topics Concern   Not on file  Social History Narrative   Lives at home with husband.   Married 1988.   Right-handed.   Social Determinants of Health   Financial Resource Strain: Low Risk  (05/21/2022)   Overall Financial Resource Strain (CARDIA)    Difficulty of Paying Living Expenses: Not hard at all  Food Insecurity: No Food  Insecurity (05/21/2022)   Hunger Vital Sign    Worried About Running Out of Food in the Last Year: Never true    Ran Out of Food in the Last Year: Never true  Transportation Needs: No Transportation Needs (05/21/2022)   PRAPARE - Hydrologist (Medical): No    Lack of Transportation (Non-Medical):  No  Physical Activity: Sufficiently Active (05/21/2022)   Exercise Vital Sign    Days of Exercise per Week: 3 days    Minutes of Exercise per Session: 60 min  Stress: No Stress Concern Present (05/21/2022)   [place]    Feeling of Stress : Not at all  Social Connections: Moderately Integrated (05/21/2022)   Social Connection and Isolation Panel [NHANES]    Frequency of Communication with Friends and Family: More than three times a week    Frequency of Social Gatherings with Friends and Family: Once a week    Attends Religious Services: Never    Marine scientist or Organizations: Yes    Attends Music therapist: More than 4 times per year    Marital Status: Married     Family History: The patient's family history includes Colon cancer in her father, maternal grandfather, and mother; Colon polyps in her brother and brother; Crohn's disease in her daughter; Dementia in her maternal grandmother; Diabetes in her brother and brother; Heart disease in her maternal grandfather, maternal uncle, and mother; Lymphoma in her maternal aunt; Rectal cancer (age of onset: 21) in her mother. There is no history of Esophageal cancer, Stomach cancer, or Breast cancer.  ROS:   Please see the history of present illness.     All other systems reviewed and are negative.  EKGs/Labs/Other Studies Reviewed:    The following studies were reviewed today:   EKG:  EKG is  ordered today.  The ekg ordered today demonstrates   07/02/2021- NSR, IRRRB  01/13/2023- NSR  Recent Labs: 08/12/2022: TSH  1.800 10/07/2022: Hemoglobin 14.1; Platelet Count 156 12/17/2022: ALT 25; BUN 16; Creatinine, Ser 0.71; Potassium 4.8; Sodium 144   Recent Lipid Panel    Component Value Date/Time   CHOL 151 12/17/2022 1225   TRIG 66 12/17/2022 1225   HDL 64 12/17/2022 1225   CHOLHDL 4 02/12/2020 1704   VLDL 17.4 02/12/2020 1704   LDLCALC 74 12/17/2022 1225     Risk Assessment/Calculations:           Physical Exam:    VS:  Vitals:   01/13/23 0949  BP: 138/74  Pulse: 66     Wt Readings from Last 3 Encounters:  01/13/23 185 lb (83.9 kg)  01/07/23 185 lb 3 oz (84 kg)  01/04/23 182 lb 8 oz (82.8 kg)     GEN:  Well nourished, well developed in no acute distress HEENT: Normal NECK: No JVD; No carotid bruits LYMPHATICS: No lymphadenopathy CARDIAC: RRR, no murmurs, rubs, gallops RESPIRATORY:  Clear to auscultation without rales, wheezing or rhonchi  ABDOMEN: Soft, non-tender, non-distended MUSCULOSKELETAL:  No edema; No deformity  SKIN: Warm and dry NEUROLOGIC:  Alert and oriented x 3 PSYCHIATRIC:  Normal affect   ASSESSMENT:   Mild Dyspnea: thankfully all of her echoes and strain have been normal during her herceptin therapy. She has no CVD hx. Obtained a coronary CT, no obstructive CAD.  Mild CAC : < 75th percentile. LDL is now < 100 mg/dL, continue lipitor 20 mg daily  Breast Cancer Survivor: No increased CVD risk with tamoxifen. She's had seed radiation for LUQ breast CA; unknown mean heart dose but likely low. Pending R breast biopsy  PLAN:    In order of problems listed above:   Follow up 12 months Happy to see her sooner if planned for anthracycline therapy or her2  Medication Adjustments/Labs and Tests Ordered: Current medicines are reviewed at length with the patient today.  Concerns regarding medicines are outlined above.  Orders Placed This Encounter  Procedures   EKG 12-Lead   No orders of the defined types were placed in this  encounter.   Patient Instructions  Medication Instructions:  No changes *If you need a refill on your cardiac medications before your next appointment, please call your pharmacy*  Follow-Up: At [facility], you and your health needs are our priority.  As part of our continuing mission to provide you with exceptional heart care, we have created designated Provider Care Teams.  These Care Teams include your primary Cardiologist (physician) and Advanced Practice Providers (APPs -  Physician Assistants and Nurse Practitioners) who all work together to provide you with the care you need, when you need it.  We recommend signing up for the patient portal called "MyChart".  Sign up information is provided on this After Visit Summary.  MyChart is used to connect with patients for Virtual Visits (Telemedicine).  Patients are able to view lab/test results, encounter notes, upcoming appointments, etc.  Non-urgent messages can be sent to your provider as well.   To learn more about what you can do with MyChart, go to [email or web address].    Your next appointment:   1 year(s)  Provider:   Janina Mayo, MD        Signed, Janina Mayo, MD  01/13/2023 10:24 AM    [place]

## 2023-01-14 ENCOUNTER — Ambulatory Visit (HOSPITAL_COMMUNITY)
Admission: RE | Admit: 2023-01-14 | Discharge: 2023-01-14 | Disposition: A | Discharge status: Home / Self Care | Payer: Medicare Other | Source: Ambulatory Visit | Attending: Adult Health | Admitting: Adult Health

## 2023-01-14 ENCOUNTER — Ambulatory Visit (INDEPENDENT_AMBULATORY_CARE_PROVIDER_SITE_OTHER): Payer: Medicare Other | Admitting: Family Medicine

## 2023-01-14 ENCOUNTER — Encounter (INDEPENDENT_AMBULATORY_CARE_PROVIDER_SITE_OTHER): Payer: Self-pay | Admitting: Family Medicine

## 2023-01-14 VITALS — BP 133/76 | HR 68 | Temp 98.9°F | Ht 67.0 in | Wt 180.0 lb

## 2023-01-14 DIAGNOSIS — E559 Vitamin D deficiency, unspecified: Secondary | ICD-10-CM | POA: Diagnosis not present

## 2023-01-14 DIAGNOSIS — C50212 Malignant neoplasm of upper-inner quadrant of left female breast: Secondary | ICD-10-CM | POA: Diagnosis not present

## 2023-01-14 DIAGNOSIS — E669 Obesity, unspecified: Secondary | ICD-10-CM | POA: Diagnosis not present

## 2023-01-14 DIAGNOSIS — M546 Pain in thoracic spine: Secondary | ICD-10-CM

## 2023-01-14 DIAGNOSIS — I10 Essential (primary) hypertension: Secondary | ICD-10-CM

## 2023-01-14 DIAGNOSIS — R0609 Other forms of dyspnea: Secondary | ICD-10-CM | POA: Insufficient documentation

## 2023-01-14 DIAGNOSIS — Z6828 Body mass index (BMI) 28.0-28.9, adult: Secondary | ICD-10-CM | POA: Diagnosis not present

## 2023-01-14 DIAGNOSIS — Z170 Estrogen receptor positive status [ER+]: Secondary | ICD-10-CM | POA: Insufficient documentation

## 2023-01-14 DIAGNOSIS — R9431 Abnormal electrocardiogram [ECG] [EKG]: Secondary | ICD-10-CM | POA: Insufficient documentation

## 2023-01-14 DIAGNOSIS — Z87891 Personal history of nicotine dependence: Secondary | ICD-10-CM

## 2023-01-14 MED ORDER — VITAMIN D3 125 MCG (5000 UT) PO CAPS: 5000.0000 [IU] | ORAL_CAPSULE | Freq: Every day | ORAL | 0 refills | Status: AC

## 2023-01-14 MED ORDER — GADOBUTROL 1 MMOL/ML IV SOLN
7.0000 mL | Freq: Once | INTRAVENOUS | Status: AC | PRN
  Administered 2023-01-14: 7 mL via INTRAVENOUS

## 2023-01-14 NOTE — Progress Notes (Deleted)
Patient feels she "fell off the wagon" since he last appointment.  She feels frustrated with the fact that she ate more indulgently since last appointment.  She has had numerous medical issues including breast biopsy, COVID, and increase in back pain.  She has a MRI today.  Her breast biopsy was thankfully negative.  She recognizes she has been doing quite a bit of emotional eating.  Wants to get back on track which is getting back to journaling.  Her journaling app crashed in interim and she has struggled to log at all and she voices that the app she had to [facility] looks different now.

## 2023-01-18 ENCOUNTER — Inpatient Hospital Stay: Payer: Medicare Other | Attending: Adult Health | Admitting: Adult Health

## 2023-01-18 ENCOUNTER — Telehealth: Payer: Self-pay

## 2023-01-18 ENCOUNTER — Encounter: Payer: Self-pay | Admitting: Adult Health

## 2023-01-18 DIAGNOSIS — C50212 Malignant neoplasm of upper-inner quadrant of left female breast: Secondary | ICD-10-CM

## 2023-01-18 DIAGNOSIS — M546 Pain in thoracic spine: Secondary | ICD-10-CM

## 2023-01-18 DIAGNOSIS — Z170 Estrogen receptor positive status [ER+]: Secondary | ICD-10-CM | POA: Diagnosis not present

## 2023-01-18 NOTE — Telephone Encounter (Signed)
Spoke with patient, per Aimee Bihari NP she will need a bone scan done. She has a 3:45pm telephone visit today to further discuss this with Aimee Bihari NP. Patient verbalized understanding and agreed to do the telephone visit today.

## 2023-01-19 NOTE — Progress Notes (Signed)
[facility] Follow up:    Aimee Ghent, MD [place] [id number]   DIAGNOSIS:  Cancer Staging  Malignant neoplasm of upper-inner quadrant of left breast in female, estrogen receptor positive ([place]) Staging form: Breast, AJCC 8th Edition - Clinical stage from 01/14/2020: Stage IB (cT1c, cN1, cM0, G3, ER+, PR+, HER2+) - Signed by Truitt Merle, MD on 01/21/2021 Stage prefix: Initial diagnosis - Pathologic stage from 02/13/2021: Stage IA (pT2, pN0, cM0, G3, ER+, PR+, HER2+) - Signed by Heilingoetter, Cassandra L, PA-C on 03/07/2021 Stage prefix: Initial diagnosis Nuclear grade: G3 Histologic grading system: 3 grade system  I connected with Aimee Mcclain on 01/19/23 at  3:45 PM EST by telephone and verified that I am speaking with the correct person using two identifiers.  I discussed the limitations, risks, security and privacy concerns of performing an evaluation and management service by telephone and the availability of in person appointments.  I also discussed with the patient that there may be a patient responsible charge related to this service. The patient expressed understanding and agreed to proceed.  Patient location: home Provider location: [facility] office   SUMMARY OF ONCOLOGIC HISTORY: Oncology History Overview Note  Cancer Staging Malignant neoplasm of upper-inner quadrant of left breast in female, estrogen receptor positive ([name]) Staging form: Breast, AJCC 8th Edition - Clinical stage from 01/14/2020: Stage IB (cT1c, cN1, cM0, G3, ER+, PR+, HER2+) - Signed by Truitt Merle, MD on 01/21/2021 Stage prefix: Initial diagnosis - Pathologic stage from 02/13/2021: Stage IA (pT2, pN0, cM0, G3, ER+, PR+, HER2+) - Signed by Heilingoetter, Cassandra L, PA-C on 03/07/2021 Stage prefix: Initial diagnosis Nuclear grade: G3 Histologic grading system: 3 grade system    Malignant neoplasm of upper-inner quadrant of left breast in female, estrogen receptor  positive (Aimee Mcclain)  01/14/2020 Cancer Staging   Staging form: Breast, AJCC 8th Edition - Clinical stage from 01/14/2020: Stage IB (cT1c, cN1, cM0, G3, ER+, PR+, HER2+) - Signed by Truitt Merle, MD on 01/21/2021 Stage prefix: Initial diagnosis   01/02/2021 Mammogram   Left breast with 1.9x1x1.8 cm complex cyst at 12:00 position, 4cmfn, versus saloid mass with calcifications, irregulr, increased in size. Biopsy recommended.    01/13/2021 Initial Biopsy   Diagnosis 1. Breast, left, needle core biopsy, 11 o'clock, 4cmfn - INVASIVE DUCTAL CARCINOMA - SEE COMMENT 2. Lymph node, needle/core biopsy, left axilla - FIBROVASCULAR AND ADIPOSE TISSUE - NO LYMPHOID TISSUE OR CARCINOMA IDENTIFIED Microscopic Comment 1. Based on the biopsy, the carcinoma appears Nottingham grade 3 of 3 and measures 1.2 cm in greatest linear extent. Prognostic markers (ER/PR/ki-67/HER2) are pending and will be reported in an addendum. Dr. Jeannie Done reviewed the case and agrees with the above diagnosis. These results were called to [facility] on January 14, 2021   01/13/2021 Receptors her2   1. PROGNOSTIC INDICATORS Results: IMMUNOHISTOCHEMICAL AND MORPHOMETRIC ANALYSIS PERFORMED MANUALLY The tumor cells are POSITIVE for Her2 (3+). Estrogen Receptor: 95%, POSITIVE, STRONG STAINING INTENSITY Progesterone Receptor: 85%, POSITIVE, STRONG STAINING INTENSITY Proliferation Marker Ki67: 30%   01/16/2021 Initial Diagnosis   Malignant neoplasm of upper-inner quadrant of left breast in female, estrogen receptor positive ([name])   01/23/2021 Imaging   MRI Breast  IMPRESSION: 1. Known malignancy measuring 2.9 centimeters in the UPPER INNER QUADRANT of the LEFT breast. There is no evidence for involvement of the pectoralis muscle or implant. 2. No evidence for lymphadenopathy. 3. RIGHT breast is negative. 4. Bilateral retropectoral saline implants.   02/09/2021  Imaging   MRI Brain IMPRESSION: No evidence of intracranial metastatic  disease.   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     02/13/2021 Surgery   LEFT BREAST LUMPECTOMY WITH RADIOACTIVE SEED AND SENTINEL LYMPH NODE BIOPSY WITH SEED TARGETED NODE and PAC Placed by Dr Barry Dienes   02/13/2021 Pathology Results   FINAL MICROSCOPIC DIAGNOSIS:   A. BREAST, LEFT, LUMPECTOMY:  - Invasive ductal carcinoma, 2.6 cm, Nottingham grade 3 of 3.  - Ductal carcinoma in situ, high grade with central necrosis.  - Margins of resection:       - Invasive carcinoma broadly involves the anterior and inferior  margins.  Invasive carcinoma is focally < 1 mm from each the superior  and medial margins.       - DCIS is focally < 1 mm from the inferior margin.  DCIS is focally  1 mm from each the anterior and superior margins.  DCIS is focally 1-2  mm from the medial margin.  - Biopsy clip.  - See oncology table.   B. BREAST, LEFT, ANTERIOR TISSUE, EXCISION:  - Residual invasive and in-situ ductal carcinoma.  - Residual invasive ductal carcinoma broadly involves the posterior  margin.  - Residual DCIS is focally 1 mm from the posterior margin.   C. BREAST, LEFT, INFERIOR MARGIN, EXCISION:  - No carcinoma identified.   D. SENTINEL LYMPH NODE, LEFT AXILLARY #1, BIOPSY:  - No carcinoma identified in one lymph node (0/1).   E. SENTINEL LYMPH NODE, LEFT AXILLARY #2, BIOPSY:  - Biopsy site.  - No distinct nodal tissue identified.  - No carcinoma identified.   F. SENTINEL LYMPH NODE, LEFT AXILLARY #3, BIOPSY:  - No carcinoma identified in one lymph node (0/1).   G. SENTINEL LYMPH NODE, LEFT AXILLARY #4, BIOPSY:  - No carcinoma identified in one lymph node (0/1).    02/13/2021 Cancer Staging   Staging form: Breast, AJCC 8th Edition - Pathologic stage from 02/13/2021: Stage IA (pT2, pN0, cM0, G3, ER+, PR+, HER2+) - Signed by Heilingoetter, Cassandra L, PA-C on 03/07/2021 Stage prefix: Initial diagnosis Nuclear grade: G3 Histologic grading system: 3 grade system   03/18/2021 -  02/19/2022 Chemotherapy   Patient is on Treatment Plan : BREAST Paclitaxel + Trastuzumab q7d / Trastuzumab q21d     06/2021 - 07/2021 Radiation Therapy   Adjuvant radiation per Dr. Isidore Moos   08/20/2021 Imaging   CT Chest w/o contrast  IMPRESSION: No definite evidence of pulmonary metastatic disease.   5 mm indeterminate right lower lobe pulmonary nodule. Comparison with prior examinations, if available, is recommended to determine chronicity. No follow-up needed if patient is low-risk. In absence of priors, Non-contrast chest CT can be considered in 12 months if patient is high-risk. This recommendation follows the consensus statement: Guidelines for Management of Incidental Pulmonary Nodules Detected on CT Images: From the Fleischner Society 2017; Radiology 2017; 284:228-243.   Mild coronary artery calcification.   08/20/2021 Imaging   CT Head w/o contrast  IMPRESSION: No acute abnormality. No evidence of intracranial metastatic disease.   08/2021 -  Anti-estrogen oral therapy   Began adjuvant exemestane; changed to tamoxifen 02/2022   11/13/2021 Survivorship   SCP delivered by [name], NP     CURRENT THERAPY: Tamoxifen  INTERVAL HISTORY: Remington Skalsky [place] 67 y.o. female met with me today over telephone to discuss her recent MRI of the spine on after undergoing x-rays followed by additional imaging due to worsening upper back pain that she has been experiencing difficulty  with for several weeks.  Her MRI demonstrated no evidence of acute fracture.  Mild, 10% anterior wedging at T4 and T5 favored to be physiologic versus mild compression deformities.  A 1.6 cm heterogeneous lesion within the posterior aspect of the T12 vertebral body favored to represent an atypical hemangioma.  A bone scan was recommended to further evaluate due to her history of malignancy.  Additionally mild bilateral neural foraminal narrowing at T2-T3 and right-sided neuroforaminal narrowing at T3-T4 due to foraminal  disc bulging and endplate spurring.  Significant spinal canal stenosis in the thoracic spine was noted.  Adriahna still struggles with pain.  She really does not like to take medicine so she does take Tylenol which helps somewhat but the pain is still present.   Patient Active Problem List   Diagnosis Date Noted   Non-alcoholic fatty liver disease 06/24/2022   Endometriosis 06/24/2022   COVID-19 05/28/2022   Paronychia of great toe, right 01/16/2022   Pain of right great toe 01/16/2022   Loose toenail 01/16/2022   Leukopenia 12/17/2021   Exposure to COVID-19 virus 12/17/2021   HTN (hypertension) 06/22/2021   Herpes simplex 06/19/2021   Osteopenia 06/19/2021   Medicare welcome exam 05/21/2021   Malignant neoplasm of upper-inner quadrant of left breast in female, estrogen receptor positive ([place]) 01/16/2021   Chronic migraine 08/21/2020   Screening mammogram, encounter for 06/30/2020   Paresthesia 06/09/2020   Neuropathy 02/14/2020   Atypical chest pain 02/14/2020   Healthcare maintenance 02/14/2020   Migraine    Advance care planning 01/16/2019   Family history of colon cancer     is allergic to ibuprofen, omnicef [cefdinir], proton pump inhibitors, roxicodone [oxycodone hcl], and vioxx [rofecoxib].  MEDICAL HISTORY: Past Medical History:  Diagnosis Date   Arthritis    Breast cancer ([place])    Cataract    Colon polyps    Endometriosis    Family history of colon cancer    Family history of colonic polyps    Fuchs' corneal dystrophy    GERD (gastroesophageal reflux disease)    H/O bilateral breast implants    Heartburn    High blood pressure    History of colon polyps    HPV in female    HSV (herpes simplex virus) infection    Migraine    no longer having migraines   Osteoarthritis    Osteopenia    Port-A-Cath in place 03/18/2021   S/P TAH (total abdominal hysterectomy) 1991   Vitamin D deficiency     SURGICAL HISTORY: Past Surgical History:  Procedure Laterality  Date   ABDOMINAL HYSTERECTOMY     BREAST BIOPSY     Benign.   BREAST ENHANCEMENT SURGERY     BREAST LUMPECTOMY Bilateral 1993 and 1995   BREAST LUMPECTOMY WITH RADIOACTIVE SEED AND SENTINEL LYMPH NODE BIOPSY Left 02/13/2021   Procedure: LEFT BREAST LUMPECTOMY WITH RADIOACTIVE SEED AND SENTINEL LYMPH NODE BIOPSY WITH SEED TARGETED NODE;  Surgeon: Stark Klein, MD;  Location: [facility];  Service: General;  Laterality: Left;   CATARACT EXTRACTION, BILATERAL     CESAREAN SECTION     PARTIAL HYSTERECTOMY     PLACEMENT OF BREAST IMPLANTS  1993   PORT-A-CATH REMOVAL Right 11/04/2021   Procedure: REMOVAL PORT-A-CATH;  Surgeon: Stark Klein, MD;  Location: [facility];  Service: General;  Laterality: Right;   PORTACATH PLACEMENT Right 02/13/2021   Procedure: INSERTION PORT-A-CATH;  Surgeon: Stark Klein, MD;  Location: [facility];  Service: General;  Laterality: Right;   TONSILLECTOMY     WISDOM TOOTH EXTRACTION      SOCIAL HISTORY: Social History   Socioeconomic History   Marital status: Married    Spouse name: Not on file   Number of children: 2   Years of education: some college   Highest education level: Not on file  Occupational History   Occupation: Accountant  Tobacco Use   Smoking status: Former    Packs/day: 0.50    Years: 10.00    Total pack years: 5.00    Types: Cigarettes    Quit date: 12/14/1993    Years since quitting: 29.1   Smokeless tobacco: Never  Vaping Use   Vaping Use: Never used  Substance and Sexual Activity   Alcohol use: Yes    Comment: very rare   Drug use: No   Sexual activity: Yes    Comment: hyst  Other Topics Concern   Not on file  Social History Narrative   Lives at home with husband.   Married 1988.   Right-handed.   Social Determinants of Health   Financial Resource Strain: Low Risk  (05/21/2022)   Overall Financial Resource Strain (CARDIA)    Difficulty of Paying Living Expenses: Not hard at all  Food  Insecurity: No Food Insecurity (05/21/2022)   Hunger Vital Sign    Worried About Running Out of Food in the Last Year: Never true    Ran Out of Food in the Last Year: Never true  Transportation Needs: No Transportation Needs (05/21/2022)   PRAPARE - Hydrologist (Medical): No    Lack of Transportation (Non-Medical): No  Physical Activity: Sufficiently Active (05/21/2022)   Exercise Vital Sign    Days of Exercise per Week: 3 days    Minutes of Exercise per Session: 60 min  Stress: No Stress Concern Present (05/21/2022)   [place]    Feeling of Stress : Not at all  Social Connections: Moderately Integrated (05/21/2022)   Social Connection and Isolation Panel [NHANES]    Frequency of Communication with Friends and Family: More than three times a week    Frequency of Social Gatherings with Friends and Family: Once a week    Attends Religious Services: Never    Marine scientist or Organizations: Yes    Attends Music therapist: More than 4 times per year    Marital Status: Married  Human resources officer Violence: Not At Risk (05/21/2022)   Humiliation, Afraid, Rape, and Kick questionnaire    Fear of Current or Ex-Partner: No    Emotionally Abused: No    Physically Abused: No    Sexually Abused: No    FAMILY HISTORY: Family History  Problem Relation Age of Onset   Dementia Maternal Grandmother    Heart disease Maternal Grandfather    Colon cancer Maternal Grandfather        dx over 69   Colon cancer Father        d. 40   Heart disease Mother    Colon cancer Mother        dx late 58s-70s   Rectal cancer Mother 41   Diabetes Brother    Colon polyps Brother    Diabetes Brother    Colon polyps Brother    Lymphoma Maternal Aunt    Heart disease Maternal Uncle    Crohn's disease Daughter    Esophageal cancer Neg Hx  Stomach cancer Neg Hx    Breast cancer Neg Hx     Review of  Systems  Constitutional:  Negative for appetite change, chills, fatigue, fever and unexpected weight change.  HENT:   Negative for hearing loss, lump/mass and trouble swallowing.   Eyes:  Negative for eye problems and icterus.  Respiratory:  Negative for chest tightness, cough and shortness of breath.   Cardiovascular:  Negative for chest pain, leg swelling and palpitations.  Gastrointestinal:  Negative for abdominal distention, abdominal pain, constipation, diarrhea, nausea and vomiting.  Endocrine: Negative for hot flashes.  Genitourinary:  Negative for difficulty urinating.   Musculoskeletal:  Positive for back pain. Negative for arthralgias.  Skin:  Negative for itching and rash.  Neurological:  Negative for dizziness, extremity weakness, headaches and numbness.  Hematological:  Negative for adenopathy. Does not bruise/bleed easily.  Psychiatric/Behavioral:  Negative for depression. The patient is not nervous/anxious.       PHYSICAL EXAMINATION Patient sounds well she is in no apparent distress.  Mood and behavior are normal speech is normal and breathing is nonlabored.  LABORATORY DATA: None for this visit   ASSESSMENT and THERAPY PLAN:   Malignant neoplasm of upper-inner quadrant of left breast in female, estrogen receptor positive ([place]) Aimee Mcclain is a 67 year old woman with stage Ib ER positive HER2 positive left-sided breast cancer diagnosed in 2022 status postlumpectomy followed by adjuvant chemotherapy, maintenance trastuzumab, adjuvant radiation, and antiestrogen therapy with tamoxifen which began in September 2022.  Hortencia and I reviewed her MRI results in detail.  I shared with her that there is no sign of cancer on her MRI however there is 1 area of uncertainty therefore we will obtain a bone scan to further evaluate her back this will also evaluate all the bones in her body.  The area where she is having pain is not the area where the uncertain lesion is.  I reviewed with  her that she has some disc bulging in this area and may benefit from physical therapy or neurosurgery consult.  Over the next week she is going to take Aleve 1 tablet in the morning with food to see if this helps with some of her discomfort since she has not taken anything for inflammation.  Will follow-up after her bone scan, evaluate her pain and review those results and then discuss next steps.   All questions were answered. The patient knows to call the clinic with any problems, questions or concerns. We can certainly see the patient much sooner if necessary.  Follow up instructions:    -Return to cancer center after bone scan completion -Dr. Morey Hummingbird follow-up in April 2024   The patient was provided an opportunity to ask questions and all were answered. The patient agreed with the plan and demonstrated an understanding of the instructions.   The patient was advised to call back or seek an in-person evaluation if the symptoms worsen or if the condition fails to improve as anticipated.   I provided 8 minutes of non face-to-face telephone visit time during this encounter, and > 50% was spent counseling as documented under my assessment & plan.  Wilber Bihari, NP 01/19/23 8:22 AM Medical Oncology and Hematology [facility] [place], [place] [address] Tel. [phone]    Fax. [phone]

## 2023-01-19 NOTE — Assessment & Plan Note (Signed)
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  [name] and I reviewed her MRI results in detail.  I shared with her that there is no sign of cancer on her MRI however there is 1 area of uncertainty therefore we will obtain a bone scan to further evaluate her back this will also evaluate all the bones in her body.  The area where she is having pain is not the area where the uncertain lesion is.  I reviewed with her that she has some disc bulging in this area and may benefit from physical therapy or neurosurgery consult.  Over the next week she is going to take Aleve 1 tablet in the morning with food to see if this helps with some of her discomfort since she has not taken anything for inflammation.  Will follow-up after her bone scan, evaluate her pain and review those results and then discuss next steps.

## 2023-01-21 ENCOUNTER — Encounter (HOSPITAL_COMMUNITY)
Admission: RE | Admit: 2023-01-21 | Discharge: 2023-01-21 | Disposition: A | Discharge status: Home / Self Care | Payer: Medicare Other | Source: Ambulatory Visit | Attending: Adult Health | Admitting: Adult Health

## 2023-01-21 ENCOUNTER — Encounter: Payer: Self-pay | Admitting: Hematology

## 2023-01-21 ENCOUNTER — Encounter: Payer: Self-pay | Admitting: Adult Health

## 2023-01-21 DIAGNOSIS — M546 Pain in thoracic spine: Secondary | ICD-10-CM | POA: Insufficient documentation

## 2023-01-21 DIAGNOSIS — C50212 Malignant neoplasm of upper-inner quadrant of left female breast: Secondary | ICD-10-CM | POA: Diagnosis not present

## 2023-01-21 DIAGNOSIS — Z170 Estrogen receptor positive status [ER+]: Secondary | ICD-10-CM | POA: Insufficient documentation

## 2023-01-21 MED ORDER — TECHNETIUM TC 99M MEDRONATE IV KIT
20.9000 | PACK | Freq: Once | INTRAVENOUS | Status: AC
  Administered 2023-01-21: 20.9 via INTRAVENOUS

## 2023-01-26 NOTE — Progress Notes (Signed)
Chief Complaint:   OBESITY Aimee Mcclain is here to discuss her progress with her obesity treatment plan along with follow-up of her obesity related diagnoses. Aimee Mcclain is on keeping a food journal and adhering to recommended goals of 1550-1700 calories and 115+ grams of protein and states she is following her eating plan approximately 20% of the time. Aimee Mcclain states she is exercising 0 minutes 0 times per week.  Today's visit was #: 7 Starting weight: 191 lbs Starting date: 08/12/2020 Today's weight: 180 lbs Today's date: 01/14/2023 Total lbs lost to date: 11 lbs Total lbs lost since last in-office visit: 3  Interim History:  Patient feels she "fell off the wagon" since he last appointment.  She feels frustrated with the fact that she ate more indulgently since last appointment.  She has had numerous medical issues including breast biopsy, COVID, and increase in back pain.  She has a MRI today.  Her breast biopsy was thankfully negative.  She recognizes she has been doing quite a bit of emotional eating.  Wants to get back on track which is getting back to journaling.  Her journaling app crashed in interim and she has struggled to log at all and she voices that the app she had to [facility] looks different now.    Subjective:   1. Essential hypertension Blood pressure better today.  Aimee Mcclain brings blood pressure log today and home monitor synonymous with blood pressure cuff in office.  Home readings ranging from 126-144/67-86.  Just saw cardiology and blood pressure controlled.   2. Vitamin D deficiency Labs discussed during visit today.  Aimee Mcclain is currently taking prescription Vit D 50,000 IU once a week.  Last Vit d level at goal of 60.9.  Notes fatigue.  Assessment/Plan:   1. Essential hypertension Continue to monitor without medications at this time.  2. Vitamin D deficiency  Change to Vit D 5K IU daily (over the counter) for 1 month with 0 refills.  -Change to  Cholecalciferol  (VITAMIN D3) 125 MCG (5000 UT) CAPS; Take 1 capsule (5,000 Units total) by mouth daily.  Dispense: 30 capsule; Refill: 0  3. Obesity with current BMI of 28.3 Aimee Mcclain is currently in the action stage of change. As such, her goal is to continue with weight loss efforts. She has agreed to keeping a food journal and adhering to recommended goals of 1150-1700 calories and 115+ grams of protein daily.   Exercise goals: No exercise has been prescribed at this time.  Behavioral modification strategies: increasing lean protein intake, meal planning and cooking strategies, keeping healthy foods in the home, emotional eating strategies, planning for success, and keeping a strict food journal.  Aimee Mcclain has agreed to follow-up with our clinic in 4 weeks. She was informed of the importance of frequent follow-up visits to maximize her success with intensive lifestyle modifications for her multiple health conditions.   Objective:   Blood pressure 133/76, pulse 68, temperature 98.9 F (37.2 C), height '5\' 7"'$  (1.702 m), weight 180 lb (81.6 kg), SpO2 98 %. Body mass index is 28.19 kg/m.  General: Cooperative, alert, well developed, in no acute distress. HEENT: Conjunctivae and lids unremarkable. Cardiovascular: Regular rhythm.  Lungs: Normal work of breathing. Neurologic: No focal deficits.   Lab Results  Component Value Date   CREATININE 0.71 12/17/2022   BUN 16 12/17/2022   NA 144 12/17/2022   K 4.8 12/17/2022   CL 105 12/17/2022   CO2 22 12/17/2022   Lab Results  Component Value  Date   ALT 25 12/17/2022   AST 26 12/17/2022   ALKPHOS 81 12/17/2022   BILITOT 0.4 12/17/2022   Lab Results  Component Value Date   HGBA1C 5.4 12/17/2022   HGBA1C 5.3 08/12/2022   HGBA1C 5.2 08/21/2020   Lab Results  Component Value Date   INSULIN 7.9 12/17/2022   INSULIN 13.0 08/12/2022   Lab Results  Component Value Date   TSH 1.800 08/12/2022   Lab Results  Component Value Date   CHOL 151 12/17/2022    HDL 64 12/17/2022   LDLCALC 74 12/17/2022   TRIG 66 12/17/2022   CHOLHDL 4 02/12/2020   Lab Results  Component Value Date   VD25OH 60.9 12/17/2022   VD25OH 40.0 08/12/2022   Lab Results  Component Value Date   WBC 4.8 10/07/2022   HGB 14.1 10/07/2022   HCT 41.5 10/07/2022   MCV 89.6 10/07/2022   PLT 156 10/07/2022   No results found for: "IRON", "TIBC", "FERRITIN"  Attestation Statements:   Reviewed by clinician on day of visit: allergies, medications, problem list, medical history, surgical history, family history, social history, and previous encounter notes.  Time spent on visit including pre-visit chart review and post-visit care and charting was 25 minutes.   I, Elnora Morrison, RMA am acting as transcriptionist for Coralie Common, MD.  I have reviewed the above documentation for accuracy and completeness, and I agree with the above. - Coralie Common, MD

## 2023-02-08 ENCOUNTER — Encounter (INDEPENDENT_AMBULATORY_CARE_PROVIDER_SITE_OTHER): Payer: Self-pay | Admitting: Family Medicine

## 2023-02-08 ENCOUNTER — Ambulatory Visit (INDEPENDENT_AMBULATORY_CARE_PROVIDER_SITE_OTHER): Payer: Medicare Other | Admitting: Family Medicine

## 2023-02-08 VITALS — BP 148/87 | HR 86 | Temp 98.5°F | Ht 67.0 in | Wt 183.0 lb

## 2023-02-08 DIAGNOSIS — E7849 Other hyperlipidemia: Secondary | ICD-10-CM

## 2023-02-08 DIAGNOSIS — Z6828 Body mass index (BMI) 28.0-28.9, adult: Secondary | ICD-10-CM

## 2023-02-08 DIAGNOSIS — E669 Obesity, unspecified: Secondary | ICD-10-CM

## 2023-02-08 NOTE — Progress Notes (Deleted)
Patient feels like she is really struggling with food addiction.  Food intake and indulgences increased with the start of some of the medical events and procedures that came to fruition over the last few weeks.  Eating on plan breakfast and lunch. Tends to start craving sweet midday and then after dinner. She is still really struggling with emotional eating.  She is going to [facility] in August and staying in a cabin in [place].

## 2023-02-22 NOTE — Progress Notes (Unsigned)
Chief Complaint:   OBESITY Aimee Mcclain is here to discuss her progress with her obesity treatment plan along with follow-up of her obesity related diagnoses. Aimee Mcclain is on keeping a food journal and adhering to recommended goals of 1150-1700 calories and 115+ protein and states she is following her eating plan approximately 70% of the time. Aimee Mcclain states she is gym 60 minutes 3 times per week.  Today's visit was #: 8 Starting weight: 191 LBS Starting date: 08/12/2020 Today's weight: 183 LBS Today's date: 02/08/2023 Total lbs lost to date: 8 LBS Total lbs lost since last in-office visit: 3 LBS  Interim History: Patient feels like she is really struggling with food addiction. Food intake and indulgences increased with the start of some of the medical events and procedures that came to fruition over the last few weeks. Eating on plan breakfast and lunch. Tends to start craving sweet midday and then after dinner. She is still really struggling with emotional eating. She is going to [facility] in August and staying in a cabin in [place].   Subjective:   1. Other hyperlipidemia Patient's last LDL was 74, HDL 64,  triglycerides 66.  Assessment/Plan:   1. Other hyperlipidemia Continue current medications.  No change in medication dosing.  2. Obesity with current BMI of 28.7 Aimee Mcclain is currently in the action stage of change. As such, her goal is to continue with weight loss efforts. She has agreed to keeping a food journal and adhering to recommended goals of 1450-1600 calories and 95+ on all protein.   Exercise goals: All adults should avoid inactivity. Some physical activity is better than none, and adults who participate in any amount of physical activity gain some health benefits.  Behavioral modification strategies: increasing lean protein intake, meal planning and cooking strategies, keeping healthy foods in the home, planning for success, and keeping a strict food journal.  Aimee Mcclain has  agreed to follow-up with our clinic in 3 weeks. She was informed of the importance of frequent follow-up visits to maximize her success with intensive lifestyle modifications for her multiple health conditions.   Objective:   Blood pressure (!) 148/87, pulse 86, temperature 98.5 F (36.9 C), height '5\' 7"'$  (1.702 m), weight 183 lb (83 kg), SpO2 100 %. Body mass index is 28.66 kg/m.  General: Cooperative, alert, well developed, in no acute distress. HEENT: Conjunctivae and lids unremarkable. Cardiovascular: Regular rhythm.  Lungs: Normal work of breathing. Neurologic: No focal deficits.   Lab Results  Component Value Date   CREATININE 0.71 12/17/2022   BUN 16 12/17/2022   NA 144 12/17/2022   K 4.8 12/17/2022   CL 105 12/17/2022   CO2 22 12/17/2022   Lab Results  Component Value Date   ALT 25 12/17/2022   AST 26 12/17/2022   ALKPHOS 81 12/17/2022   BILITOT 0.4 12/17/2022   Lab Results  Component Value Date   HGBA1C 5.4 12/17/2022   HGBA1C 5.3 08/12/2022   HGBA1C 5.2 08/21/2020   Lab Results  Component Value Date   INSULIN 7.9 12/17/2022   INSULIN 13.0 08/12/2022   Lab Results  Component Value Date   TSH 1.800 08/12/2022   Lab Results  Component Value Date   CHOL 151 12/17/2022   HDL 64 12/17/2022   LDLCALC 74 12/17/2022   TRIG 66 12/17/2022   CHOLHDL 4 02/12/2020   Lab Results  Component Value Date   VD25OH 60.9 12/17/2022   VD25OH 40.0 08/12/2022   Lab Results  Component Value  Date   WBC 4.8 10/07/2022   HGB 14.1 10/07/2022   HCT 41.5 10/07/2022   MCV 89.6 10/07/2022   PLT 156 10/07/2022   No results found for: "IRON", "TIBC", "FERRITIN"  Attestation Statements:   Reviewed by clinician on day of visit: allergies, medications, problem list, medical history, surgical history, family history, social history, and previous encounter notes.  I, Davy Pique, RMA, am acting as transcriptionist for Coralie Common, MD.  I have reviewed the above  documentation for accuracy and completeness, and I agree with the above. - Coralie Common, MD

## 2023-03-01 ENCOUNTER — Encounter (INDEPENDENT_AMBULATORY_CARE_PROVIDER_SITE_OTHER): Payer: Self-pay | Admitting: Family Medicine

## 2023-03-01 ENCOUNTER — Ambulatory Visit (INDEPENDENT_AMBULATORY_CARE_PROVIDER_SITE_OTHER): Payer: Medicare Other | Admitting: Family Medicine

## 2023-03-01 VITALS — BP 128/71 | HR 67 | Temp 98.6°F | Ht 67.0 in | Wt 180.0 lb

## 2023-03-01 DIAGNOSIS — Z6828 Body mass index (BMI) 28.0-28.9, adult: Secondary | ICD-10-CM

## 2023-03-01 DIAGNOSIS — E669 Obesity, unspecified: Secondary | ICD-10-CM | POA: Diagnosis not present

## 2023-03-01 DIAGNOSIS — E7849 Other hyperlipidemia: Secondary | ICD-10-CM | POA: Diagnosis not present

## 2023-03-01 NOTE — Progress Notes (Signed)
Chief Complaint:   OBESITY Aimee Mcclain is here to discuss her progress with her obesity treatment plan along with follow-up of her obesity related diagnoses. Aimee Mcclain is on keeping a food journal and adhering to recommended goals of 1450-1650 calories and 95+ protein and states she is following her eating plan approximately 100% of the time. Aimee Mcclain states she is at the gym for 60 minutes 3 times per week.  Today's visit was #: 9 Starting weight: 191 LBS Starting date: 08/12/2022 Today's weight: 180 LBS Today's date: 03/01/2023 Total lbs lost to date: 11 LBS Total lbs lost since last in-office visit: 3 LBS  Interim History: Patient has been journaling more consistently since last appointment.  She feels like she does better with consistent logging and checking in to ensure she is hitting goals consistently. She feels her protein intake is slightly decreased since last appointment (averaging probably around 90g of protein).  She is pushing herself to ensure she is ready to hike when she gets to [facility].  Feels like she can be consistent with her logging.  She is doing 3x a week in the gym and is focusing on resistance training.   Subjective:   1. Other hyperlipidemia Patient is on Lipitor with no side effects noted.  Last LDL was 74, triglycerides 66, HDL 64.  Assessment/Plan:   1. Other hyperlipidemia Continue current medications and dose.  2. Obesity with current BMI of 28.3 Aimee Mcclain is currently in the action stage of change. As such, her goal is to continue with weight loss efforts. She has agreed to keeping a food journal and adhering to recommended goals of 1450-1600 calories and 95+ protein.   Exercise goals:  As is.  Patient is going to focus on number of reps of weight lifted to increase after 1 month of consistency.  Behavioral modification strategies: increasing lean protein intake, meal planning and cooking strategies, keeping healthy foods in the home, planning for success,  and keeping a strict food journal.  Aimee Mcclain has agreed to follow-up with our clinic in 4 weeks. She was informed of the importance of frequent follow-up visits to maximize her success with intensive lifestyle modifications for her multiple health conditions.   Objective:   Blood pressure 128/71, pulse 67, temperature 98.6 F (37 C), height 5\' 7"  (1.702 m), weight 180 lb (81.6 kg), SpO2 98 %. Body mass index is 28.19 kg/m.  General: Cooperative, alert, well developed, in no acute distress. HEENT: Conjunctivae and lids unremarkable. Cardiovascular: Regular rhythm.  Lungs: Normal work of breathing. Neurologic: No focal deficits.   Lab Results  Component Value Date   CREATININE 0.71 12/17/2022   BUN 16 12/17/2022   NA 144 12/17/2022   K 4.8 12/17/2022   CL 105 12/17/2022   CO2 22 12/17/2022   Lab Results  Component Value Date   ALT 25 12/17/2022   AST 26 12/17/2022   ALKPHOS 81 12/17/2022   BILITOT 0.4 12/17/2022   Lab Results  Component Value Date   HGBA1C 5.4 12/17/2022   HGBA1C 5.3 08/12/2022   HGBA1C 5.2 08/21/2020   Lab Results  Component Value Date   INSULIN 7.9 12/17/2022   INSULIN 13.0 08/12/2022   Lab Results  Component Value Date   TSH 1.800 08/12/2022   Lab Results  Component Value Date   CHOL 151 12/17/2022   HDL 64 12/17/2022   LDLCALC 74 12/17/2022   TRIG 66 12/17/2022   CHOLHDL 4 02/12/2020   Lab Results  Component Value Date  VD25OH 60.9 12/17/2022   VD25OH 40.0 08/12/2022   Lab Results  Component Value Date   WBC 4.8 10/07/2022   HGB 14.1 10/07/2022   HCT 41.5 10/07/2022   MCV 89.6 10/07/2022   PLT 156 10/07/2022   No results found for: "IRON", "TIBC", "FERRITIN"  Attestation Statements:   Reviewed by clinician on day of visit: allergies, medications, problem list, medical history, surgical history, family history, social history, and previous encounter notes.  I, Davy Pique, RMA, am acting as transcriptionist for Coralie Common, MD.  I have reviewed the above documentation for accuracy and completeness, and I agree with the above. - Coralie Common, MD

## 2023-03-03 ENCOUNTER — Other Ambulatory Visit: Payer: Self-pay | Admitting: Hematology

## 2023-03-03 DIAGNOSIS — C50212 Malignant neoplasm of upper-inner quadrant of left female breast: Secondary | ICD-10-CM

## 2023-03-31 ENCOUNTER — Encounter (INDEPENDENT_AMBULATORY_CARE_PROVIDER_SITE_OTHER): Payer: Self-pay | Admitting: Family Medicine

## 2023-03-31 ENCOUNTER — Ambulatory Visit (INDEPENDENT_AMBULATORY_CARE_PROVIDER_SITE_OTHER): Payer: Medicare Other | Admitting: Family Medicine

## 2023-03-31 VITALS — BP 155/81 | HR 75 | Temp 98.4°F | Ht 67.0 in | Wt 180.0 lb

## 2023-03-31 DIAGNOSIS — I10 Essential (primary) hypertension: Secondary | ICD-10-CM

## 2023-03-31 DIAGNOSIS — E669 Obesity, unspecified: Secondary | ICD-10-CM | POA: Diagnosis not present

## 2023-03-31 DIAGNOSIS — Z6828 Body mass index (BMI) 28.0-28.9, adult: Secondary | ICD-10-CM

## 2023-03-31 DIAGNOSIS — Z6830 Body mass index (BMI) 30.0-30.9, adult: Secondary | ICD-10-CM

## 2023-03-31 DIAGNOSIS — E7849 Other hyperlipidemia: Secondary | ICD-10-CM | POA: Diagnosis not present

## 2023-03-31 NOTE — Progress Notes (Signed)
Chief Complaint:   OBESITY Aimee Mcclain is here to discuss her progress with her obesity treatment plan along with follow-up of her obesity related diagnoses. Aimee Mcclain is on keeping a food journal and adhering to recommended goals of 1450-1600 calories and 95+ protein and states she is following her eating plan approximately 60% of the time. Aimee Mcclain states she is doing desk exercises 10 minutes 5 times per week.  Today's visit was #: 10 Starting weight: 191 LBS Starting date: 08/12/2022 Today's weight: 180 LBS Today's date: 03/31/2023 Total lbs lost to date: 11 LBS Total lbs lost since last in-office visit: 0  Interim History: Patient went to [facility] for a [employer] since the last appointment.  She did not log while away because she was eating out frequently.  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  She is feeling better in the last few days than she has in a long time.  In the next few weeks she does not have any anticipated trips or events.    Subjective:   1. Essential hypertension Patient blood pressure elevated today.  Patient denies chest pain, chest pressure, headache.  Patient is not on any medications.  2. Other hyperlipidemia Patient is on Lipitor daily.  Patient denies myalgias or transaminitis.  Assessment/Plan:   1. Essential hypertension Follow-up blood pressure next appointment.  Patient is a test/check blood pressure 2-3 times a week between now and next appointment.  2. Other hyperlipidemia Continue Lipitor no change in dose or medications.  3. Obesity with current BMI of 28.3 Aimee Mcclain is currently in the action stage of change. As such, her goal is to continue with weight loss efforts. She has agreed to keeping a food journal and adhering to recommended goals of 1450-1600 calories and 95+ protein daily.   Exercise goals: All adults should avoid inactivity. Some physical activity is better than none, and adults who  participate in any amount of physical activity gain some health benefits.  Behavioral modification strategies: increasing lean protein intake, meal planning and cooking strategies, keeping healthy foods in the home, and planning for success.  Aimee Mcclain has agreed to follow-up with our clinic in 3-4 weeks. She was informed of the importance of frequent follow-up visits to maximize her success with intensive lifestyle modifications for her multiple health conditions.   Objective:   Blood pressure (!) 155/81, pulse 75, temperature 98.4 F (36.9 C), height 5\' 7"  (1.702 m), weight 180 lb (81.6 kg), SpO2 98 %. Body mass index is 28.19 kg/m.  General: Cooperative, alert, well developed, in no acute distress. HEENT: Conjunctivae and lids unremarkable. Cardiovascular: Regular rhythm.  Lungs: Normal work of breathing. Neurologic: No focal deficits.   Lab Results  Component Value Date   CREATININE 0.71 12/17/2022   BUN 16 12/17/2022   NA 144 12/17/2022   K 4.8 12/17/2022   CL 105 12/17/2022   CO2 22 12/17/2022   Lab Results  Component Value Date   ALT 25 12/17/2022   AST 26 12/17/2022   ALKPHOS 81 12/17/2022   BILITOT 0.4 12/17/2022   Lab Results  Component Value Date   HGBA1C 5.4 12/17/2022   HGBA1C 5.3 08/12/2022   HGBA1C 5.2 08/21/2020   Lab Results  Component Value Date   INSULIN 7.9 12/17/2022   INSULIN 13.0 08/12/2022   Lab Results  Component Value Date   TSH 1.800 08/12/2022   Lab Results  Component Value Date  CHOL 151 12/17/2022   HDL 64 12/17/2022   LDLCALC 74 12/17/2022   TRIG 66 12/17/2022   CHOLHDL 4 02/12/2020   Lab Results  Component Value Date   VD25OH 60.9 12/17/2022   VD25OH 40.0 08/12/2022   Lab Results  Component Value Date   WBC 4.8 10/07/2022   HGB 14.1 10/07/2022   HCT 41.5 10/07/2022   MCV 89.6 10/07/2022   PLT 156 10/07/2022   No results found for: "IRON", "TIBC", "FERRITIN"  Attestation Statements:   Reviewed by clinician on day  of visit: allergies, medications, problem list, medical history, surgical history, family history, social history, and previous encounter notes.  I, Malcolm Metro, RMA, am acting as transcriptionist for Reuben Likes, MD.  I have reviewed the above documentation for accuracy and completeness, and I agree with the above. - Reuben Likes, MD

## 2023-04-07 ENCOUNTER — Other Ambulatory Visit: Payer: Self-pay

## 2023-04-07 DIAGNOSIS — Z170 Estrogen receptor positive status [ER+]: Secondary | ICD-10-CM

## 2023-04-07 DIAGNOSIS — C50912 Malignant neoplasm of unspecified site of left female breast: Secondary | ICD-10-CM

## 2023-04-08 ENCOUNTER — Inpatient Hospital Stay: Payer: Medicare Other | Attending: Adult Health

## 2023-04-08 ENCOUNTER — Inpatient Hospital Stay (HOSPITAL_BASED_OUTPATIENT_CLINIC_OR_DEPARTMENT_OTHER): Payer: Medicare Other | Admitting: Hematology

## 2023-04-08 ENCOUNTER — Encounter: Payer: Self-pay | Admitting: Hematology

## 2023-04-08 ENCOUNTER — Other Ambulatory Visit: Payer: Self-pay

## 2023-04-08 VITALS — BP 129/60 | HR 61 | Temp 98.6°F | Resp 18 | Ht 67.0 in | Wt 186.3 lb

## 2023-04-08 DIAGNOSIS — G629 Polyneuropathy, unspecified: Secondary | ICD-10-CM | POA: Insufficient documentation

## 2023-04-08 DIAGNOSIS — M8580 Other specified disorders of bone density and structure, unspecified site: Secondary | ICD-10-CM | POA: Insufficient documentation

## 2023-04-08 DIAGNOSIS — C50912 Malignant neoplasm of unspecified site of left female breast: Secondary | ICD-10-CM

## 2023-04-08 DIAGNOSIS — Z923 Personal history of irradiation: Secondary | ICD-10-CM | POA: Diagnosis not present

## 2023-04-08 DIAGNOSIS — C50212 Malignant neoplasm of upper-inner quadrant of left female breast: Secondary | ICD-10-CM

## 2023-04-08 DIAGNOSIS — Z79899 Other long term (current) drug therapy: Secondary | ICD-10-CM | POA: Insufficient documentation

## 2023-04-08 DIAGNOSIS — Z9221 Personal history of antineoplastic chemotherapy: Secondary | ICD-10-CM | POA: Insufficient documentation

## 2023-04-08 DIAGNOSIS — Z170 Estrogen receptor positive status [ER+]: Secondary | ICD-10-CM | POA: Diagnosis not present

## 2023-04-08 DIAGNOSIS — Z800 Family history of malignant neoplasm of digestive organs: Secondary | ICD-10-CM | POA: Diagnosis not present

## 2023-04-08 DIAGNOSIS — Z79810 Long term (current) use of selective estrogen receptor modulators (SERMs): Secondary | ICD-10-CM | POA: Diagnosis not present

## 2023-04-08 LAB — CMP (CANCER CENTER ONLY)
ALT: 22 U/L (ref 0–44)
AST: 23 U/L (ref 15–41)
Albumin: 4.4 g/dL (ref 3.5–5.0)
Alkaline Phosphatase: 63 U/L (ref 38–126)
Anion gap: 4 — ABNORMAL LOW (ref 5–15)
BUN: 21 mg/dL (ref 8–23)
CO2: 30 mmol/L (ref 22–32)
Calcium: 9.8 mg/dL (ref 8.9–10.3)
Chloride: 108 mmol/L (ref 98–111)
Creatinine: 0.78 mg/dL (ref 0.44–1.00)
GFR, Estimated: >60 mL/min (ref >60)
Glucose, Bld: 90 mg/dL (ref 70–99)
Potassium: 4.4 mmol/L (ref 3.5–5.1)
Sodium: 142 mmol/L (ref 135–145)
Total Bilirubin: 0.5 mg/dL (ref 0.3–1.2)
Total Protein: 6.9 g/dL (ref 6.5–8.1)

## 2023-04-08 LAB — CBC WITH DIFFERENTIAL (CANCER CENTER ONLY)
Abs Immature Granulocytes: 0.01 10*3/uL (ref 0.00–0.07)
Basophils Absolute: 0 10*3/uL (ref 0.0–0.1)
Basophils Relative: 1 %
Eosinophils Absolute: 0.1 10*3/uL (ref 0.0–0.5)
Eosinophils Relative: 2 %
HCT: 40.3 % (ref 36.0–46.0)
Hemoglobin: 13.8 g/dL (ref 12.0–15.0)
Immature Granulocytes: 0 %
Lymphocytes Relative: 38 %
Lymphs Abs: 1.8 10*3/uL (ref 0.7–4.0)
MCH: 31.1 pg (ref 26.0–34.0)
MCHC: 34.2 g/dL (ref 30.0–36.0)
MCV: 90.8 fL (ref 80.0–100.0)
Monocytes Absolute: 0.5 10*3/uL (ref 0.1–1.0)
Monocytes Relative: 10 %
Neutro Abs: 2.3 10*3/uL (ref 1.7–7.7)
Neutrophils Relative %: 49 %
Platelet Count: 184 10*3/uL (ref 150–400)
RBC: 4.44 MIL/uL (ref 3.87–5.11)
RDW: 12.4 % (ref 11.5–15.5)
WBC Count: 4.7 10*3/uL (ref 4.0–10.5)
nRBC: 0 % (ref 0.0–0.2)

## 2023-04-08 MED ORDER — TAMOXIFEN CITRATE 20 MG PO TABS: 20.0000 mg | ORAL_TABLET | Freq: Every day | ORAL | 3 refills | Status: DC

## 2023-04-08 NOTE — Progress Notes (Signed)
[facility]   Telephone:([phone]) [phone] Fax:([phone]) [phone]   Clinic Follow up Note   Patient Care Team: Joaquim Nam, MD as PCP - General (Family Medicine) Wyline Mood Alben Spittle, MD as PCP - Cardiology (Cardiology) Almond Lint, MD as Consulting Physician (General Surgery) Malachy Mood, MD as Consulting Physician (Hematology) Lonie Peak, MD as Attending Physician (Radiation Oncology) Silverio Lay, MD as Consulting Physician (Obstetrics and Gynecology) Pollyann Samples, NP as Nurse Practitioner (Nurse Practitioner)  Date of Service:  04/08/2023  CHIEF COMPLAINT: f/u of left breast cancer  CURRENT THERAPY:  Tamoxifen 02/2022  ASSESSMENT: Aimee Mcclain is a 67 y.o. female with   Malignant neoplasm of upper-inner quadrant of left breast in female, estrogen receptor positive (HCC)  cT1cN1M0 stage Ib, triple positive, pT2N0 stage IA, grade 3 -Diagnosed 12/2020, s/p left lumpectomy and SLNB 02/13/21 by Dr. Donell Beers -Staging work-up showed small indeterminate lung nodules and an indeterminate left parietal bone lesion which will monitor with future scans, otherwise negative for distant metastasis -She completed 10 of 12 doses of adjuvant Taxol/Abraxane and herceptin on 05/21/21, last 2 doses canceled due to her worsening neuropathy. -s/p radiation 06/30/21 - 07/25/21 under Dr. Basilio Cairo. -most recent mammogram on 01/05/22 was benign. -she took exemestane 08/2021 - 01/2022, discontinued due to constipation and increased joint/bone pain. She switched to tamoxifen in 03/2022 and is tolerating better. -Due to her back pain, a bone scan was obtained in February 2024, which was negative for definitive bone metastasis.  There is mild uptake in the right periorbital region, unclear etiology, I will obtain a CT head for further evaluation.  She is asymptomatic.  Osteopenia -DEXA 06/24/21 showed osteopenia, with T-score of -1.3. -will follow every 2 years while on AI. -she endorses taking calcium and  vit D.    Neuropathy She has baseline neuropathy in her feet, etiology unknown. No functional deficits -She is not diabetic, B12 level was normal  -Due to neuropathy on her face, Abraxane discontinued after cycle 10. -she endorses taking vit B complex -she reports worsening neuropathy to her feet.    PLAN: -lab reviewed -order CT head  scan in 2-3 weeks  -I refill Tamoxifen -Recommends hydration increase and magnesium supplement for joint pain and cramps. - lab and f/u in 6 months NP Mardella Layman   SUMMARY OF ONCOLOGIC HISTORY: Oncology History Overview Note  Cancer Staging Malignant neoplasm of upper-inner quadrant of left breast in female, estrogen receptor positive (HCC) Staging form: Breast, AJCC 8th Edition - Clinical stage from 01/14/2020: Stage IB (cT1c, cN1, cM0, G3, ER+, PR+, HER2+) - Signed by Malachy Mood, MD on 01/21/2021 Stage prefix: Initial diagnosis - Pathologic stage from 02/13/2021: Stage IA (pT2, pN0, cM0, G3, ER+, PR+, HER2+) - Signed by Heilingoetter, Cassandra L, PA-C on 03/07/2021 Stage prefix: Initial diagnosis Nuclear grade: G3 Histologic grading system: 3 grade system    Malignant neoplasm of upper-inner quadrant of left breast in female, estrogen receptor positive  01/14/2020 Cancer Staging   Staging form: Breast, AJCC 8th Edition - Clinical stage from 01/14/2020: Stage IB (cT1c, cN1, cM0, G3, ER+, PR+, HER2+) - Signed by Malachy Mood, MD on 01/21/2021 Stage prefix: Initial diagnosis   01/02/2021 Mammogram   Left breast with 1.9x1x1.8 cm complex cyst at 12:00 position, 4cmfn, versus saloid mass with calcifications, irregulr, increased in size. Biopsy recommended.    01/13/2021 Initial Biopsy   Diagnosis 1. Breast, left, needle core biopsy, 11 o'clock, 4cmfn - INVASIVE DUCTAL CARCINOMA - SEE COMMENT 2. Lymph node, needle/core  biopsy, left axilla - FIBROVASCULAR AND ADIPOSE TISSUE - NO LYMPHOID TISSUE OR CARCINOMA IDENTIFIED Microscopic Comment 1. Based on the  biopsy, the carcinoma appears Nottingham grade 3 of 3 and measures 1.2 cm in greatest linear extent. Prognostic markers (ER/PR/ki-67/HER2) are pending and will be reported in an addendum. Dr. Rayetta Pigg reviewed the case and agrees with the above diagnosis. These results were called to [facility] on January 14, 2021   01/13/2021 Receptors her2   1. PROGNOSTIC INDICATORS Results: IMMUNOHISTOCHEMICAL AND MORPHOMETRIC ANALYSIS PERFORMED MANUALLY The tumor cells are POSITIVE for Her2 (3+). Estrogen Receptor: 95%, POSITIVE, STRONG STAINING INTENSITY Progesterone Receptor: 85%, POSITIVE, STRONG STAINING INTENSITY Proliferation Marker Ki67: 30%   01/16/2021 Initial Diagnosis   Malignant neoplasm of upper-inner quadrant of left breast in female, estrogen receptor positive (HCC)   01/23/2021 Imaging   MRI Breast  IMPRESSION: 1. Known malignancy measuring 2.9 centimeters in the UPPER INNER QUADRANT of the LEFT breast. There is no evidence for involvement of the pectoralis muscle or implant. 2. No evidence for lymphadenopathy. 3. RIGHT breast is negative. 4. Bilateral retropectoral saline implants.   02/09/2021 Imaging   MRI Brain IMPRESSION: No evidence of intracranial metastatic disease.   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     02/13/2021 Surgery   LEFT BREAST LUMPECTOMY WITH RADIOACTIVE SEED AND SENTINEL LYMPH NODE BIOPSY WITH SEED TARGETED NODE and PAC Placed by Dr Donell Beers   02/13/2021 Pathology Results   FINAL MICROSCOPIC DIAGNOSIS:   A. BREAST, LEFT, LUMPECTOMY:  - Invasive ductal carcinoma, 2.6 cm, Nottingham grade 3 of 3.  - Ductal carcinoma in situ, high grade with central necrosis.  - Margins of resection:       - Invasive carcinoma broadly involves the anterior and inferior  margins.  Invasive carcinoma is focally < 1 mm from each the superior  and medial margins.       - DCIS is focally < 1 mm from the inferior margin.  DCIS is focally  1 mm from each the anterior  and superior margins.  DCIS is focally 1-2  mm from the medial margin.  - Biopsy clip.  - See oncology table.   B. BREAST, LEFT, ANTERIOR TISSUE, EXCISION:  - Residual invasive and in-situ ductal carcinoma.  - Residual invasive ductal carcinoma broadly involves the posterior  margin.  - Residual DCIS is focally 1 mm from the posterior margin.   C. BREAST, LEFT, INFERIOR MARGIN, EXCISION:  - No carcinoma identified.   D. SENTINEL LYMPH NODE, LEFT AXILLARY #1, BIOPSY:  - No carcinoma identified in one lymph node (0/1).   E. SENTINEL LYMPH NODE, LEFT AXILLARY #2, BIOPSY:  - Biopsy site.  - No distinct nodal tissue identified.  - No carcinoma identified.   F. SENTINEL LYMPH NODE, LEFT AXILLARY #3, BIOPSY:  - No carcinoma identified in one lymph node (0/1).   G. SENTINEL LYMPH NODE, LEFT AXILLARY #4, BIOPSY:  - No carcinoma identified in one lymph node (0/1).    02/13/2021 Cancer Staging   Staging form: Breast, AJCC 8th Edition - Pathologic stage from 02/13/2021: Stage IA (pT2, pN0, cM0, G3, ER+, PR+, HER2+) - Signed by Heilingoetter, Cassandra L, PA-C on 03/07/2021 Stage prefix: Initial diagnosis Nuclear grade: G3 Histologic grading system: 3 grade system   03/18/2021 - 02/19/2022 Chemotherapy   Patient is on Treatment Plan : BREAST Paclitaxel + Trastuzumab q7d / Trastuzumab q21d     06/2021 - 07/2021 Radiation Therapy   Adjuvant radiation per Dr. Basilio Cairo   08/20/2021  Imaging   CT Chest w/o contrast  IMPRESSION: No definite evidence of pulmonary metastatic disease.   5 mm indeterminate right lower lobe pulmonary nodule. Comparison with prior examinations, if available, is recommended to determine chronicity. No follow-up needed if patient is low-risk. In absence of priors, Non-contrast chest CT can be considered in 12 months if patient is high-risk. This recommendation follows the consensus statement: Guidelines for Management of Incidental Pulmonary Nodules Detected on CT Images: From  the Fleischner Society 2017; Radiology 2017; 284:228-243.   Mild coronary artery calcification.   08/20/2021 Imaging   CT Head w/o contrast  IMPRESSION: No acute abnormality. No evidence of intracranial metastatic disease.   08/2021 -  Anti-estrogen oral therapy   Began adjuvant exemestane; changed to tamoxifen 02/2022   11/13/2021 Survivorship   SCP delivered by Santiago Glad, NP      INTERVAL HISTORY:  Aimee Mcclain is here for a follow up of left breast cancer She was last seen by NP Mardella Layman on 01/18/2023. She presents to the clinic accompanied by husband. Pt state that her feet cramp and her heels on both feet hurt. Pt state that she has hot flashes but its tolerable.    All other systems were reviewed with the patient and are negative.  MEDICAL HISTORY:  Past Medical History:  Diagnosis Date   Arthritis    Breast cancer    Cataract    Colon polyps    Endometriosis    Family history of colon cancer    Family history of colonic polyps    Fuchs' corneal dystrophy    GERD (gastroesophageal reflux disease)    H/O bilateral breast implants    Heartburn    High blood pressure    History of colon polyps    HPV in female    HSV (herpes simplex virus) infection    Migraine    no longer having migraines   Osteoarthritis    Osteopenia    Port-A-Cath in place 03/18/2021   S/P TAH (total abdominal hysterectomy) 1991   Vitamin D deficiency     SURGICAL HISTORY: Past Surgical History:  Procedure Laterality Date   ABDOMINAL HYSTERECTOMY     BREAST BIOPSY     Benign.   BREAST ENHANCEMENT SURGERY     BREAST LUMPECTOMY Bilateral 1993 and 1995   BREAST LUMPECTOMY WITH RADIOACTIVE SEED AND SENTINEL LYMPH NODE BIOPSY Left 02/13/2021   Procedure: LEFT BREAST LUMPECTOMY WITH RADIOACTIVE SEED AND SENTINEL LYMPH NODE BIOPSY WITH SEED TARGETED NODE;  Surgeon: Almond Lint, MD;  Location: [facility];  Service: General;  Laterality: Left;   CATARACT EXTRACTION,  BILATERAL     CESAREAN SECTION     PARTIAL HYSTERECTOMY     PLACEMENT OF BREAST IMPLANTS  1993   PORT-A-CATH REMOVAL Right 11/04/2021   Procedure: REMOVAL PORT-A-CATH;  Surgeon: Almond Lint, MD;  Location: [facility];  Service: General;  Laterality: Right;   PORTACATH PLACEMENT Right 02/13/2021   Procedure: INSERTION PORT-A-CATH;  Surgeon: Almond Lint, MD;  Location: [facility];  Service: General;  Laterality: Right;   TONSILLECTOMY     WISDOM TOOTH EXTRACTION      I have reviewed the social history and family history with the patient and they are unchanged from previous note.  ALLERGIES:  is allergic to ibuprofen, omnicef [cefdinir], proton pump inhibitors, roxicodone [oxycodone hcl], and vioxx [rofecoxib].  MEDICATIONS:  Current Outpatient Medications  Medication Sig Dispense Refill   acetaminophen (TYLENOL) 500 MG tablet Take 1,000  mg by mouth every 6 (six) hours as needed for moderate pain.     atorvastatin (LIPITOR) 20 MG tablet Take 1 tablet (20 mg total) by mouth daily. 90 tablet 3   Calcium Carbonate-Vitamin D (CALCIUM 600+D PO) Take 1 tablet by mouth daily.     Cholecalciferol (VITAMIN D3) 125 MCG (5000 UT) CAPS Take 1 capsule (5,000 Units total) by mouth daily. 30 capsule 0   Multiple Vitamin (MULTIVITAMIN) tablet Take 1 tablet by mouth daily.     omeprazole (PRILOSEC) 20 MG capsule Take 20 mg by mouth daily.     tamoxifen (NOLVADEX) 20 MG tablet Take 1 tablet (20 mg total) by mouth daily. 90 tablet 3   valACYclovir (VALTREX) 500 MG tablet Take 1 tablet (500 mg total) by mouth 2 (two) times daily as needed.     Vitamin B Complex-C CAPS Take 1 capsule by mouth.     No current facility-administered medications for this visit.    PHYSICAL EXAMINATION: ECOG PERFORMANCE STATUS: 0 - Asymptomatic  Vitals:   04/08/23 1126  BP: 129/60  Pulse: 61  Resp: 18  Temp: 98.6 F (37 C)  SpO2: 100%   Wt Readings from Last 3 Encounters:  04/08/23 186 lb 4.8 oz (84.5  kg)  03/31/23 180 lb (81.6 kg)  03/01/23 180 lb (81.6 kg)     GENERAL:alert, no distress and comfortable SKIN: skin color, texture, turgor are normal, no rashes or significant lesions EYES:(-)  normal, Conjunctiva are pink and non-injected, sclera clear NECK:(-) supple, thyroid normal size, non-tender, without nodularity LYMPH:  (-) no palpable lymphadenopathy in the cervical, axillary  speech, no focal motor/sensory deficits  BREAST: RT breast no palpable mass, LT breast  lumpectomy minimum scar tissue , firm and skin a little dark in color   . LABORATORY DATA:  I have reviewed the data as listed    Latest Ref Rng & Units 04/08/2023   10:41 AM 10/07/2022    8:19 AM 06/17/2022    9:09 AM  CBC  WBC 4.0 - 10.5 K/uL 4.7  4.8  4.8   Hemoglobin 12.0 - 15.0 g/dL 16.1  09.6  04.5   Hematocrit 36.0 - 46.0 % 40.3  41.5  40.4   Platelets 150 - 400 K/uL 184  156  205         Latest Ref Rng & Units 04/08/2023   10:41 AM 12/17/2022   12:25 PM 10/07/2022    8:19 AM  CMP  Glucose 70 - 99 mg/dL 90  87  71   BUN 8 - 23 mg/dL 21  16  17    Creatinine 0.44 - 1.00 mg/dL 4.09  8.11  9.14   Sodium 135 - 145 mmol/L 142  144  142   Potassium 3.5 - 5.1 mmol/L 4.4  4.8  4.5   Chloride 98 - 111 mmol/L 108  105  109   CO2 22 - 32 mmol/L 30  22  28    Calcium 8.9 - 10.3 mg/dL 9.8  9.7  9.6   Total Protein 6.5 - 8.1 g/dL 6.9  6.8  6.8   Total Bilirubin 0.3 - 1.2 mg/dL 0.5  0.4  0.4   Alkaline Phos 38 - 126 U/L 63  81  81   AST 15 - 41 U/L 23  26  24    ALT 0 - 44 U/L 22  25  25        RADIOGRAPHIC STUDIES: I have personally reviewed the radiological images as listed and  agreed with the findings in the report. No results found.    Orders Placed This Encounter  Procedures   CT HEAD WO CONTRAST ( )    Standing Status:   Future    Standing Expiration Date:   04/07/2024    Order Specific Question:   Preferred imaging location?    Answer:   [facility]   All questions were answered. The  patient knows to call the clinic with any problems, questions or concerns. No barriers to learning was detected. The total time spent in the appointment was 30 minutes.     Malachy Mood, MD 04/08/2023   Carolin Coy, CMA, am acting as scribe for Malachy Mood, MD.   I have reviewed the above documentation for accuracy and completeness, and I agree with the above.

## 2023-04-08 NOTE — Assessment & Plan Note (Signed)
cT1cN1M0 stage Ib, triple positive, pT2N0 stage IA, grade 3 -Diagnosed 12/2020, s/p left lumpectomy and SLNB 02/13/21 by Dr. Donell Beers -Staging work-up showed small indeterminate lung nodules and an indeterminate left parietal bone lesion which will monitor with future scans, otherwise negative for distant metastasis -She completed 10 of 12 doses of adjuvant Taxol/Abraxane and herceptin on 05/21/21, last 2 doses canceled due to her worsening neuropathy. -s/p radiation 06/30/21 - 07/25/21 under Dr. Basilio Cairo. -most recent mammogram on 01/05/22 was benign. -she took exemestane 08/2021 - 01/2022, discontinued due to constipation and increased joint/bone pain. She switched to tamoxifen in 03/2022 and is tolerating better.

## 2023-04-08 NOTE — Assessment & Plan Note (Signed)
She has baseline neuropathy in her feet, etiology unknown. No functional deficits -She is not diabetic, B12 level was normal  -Due to neuropathy on her face, Abraxane discontinued after cycle 10. -she endorses taking vit B complex -she reports worsening neuropathy to her feet.

## 2023-04-08 NOTE — Assessment & Plan Note (Signed)
-  DEXA 06/24/21 showed osteopenia, with T-score of -1.3. -will follow every 2 years while on AI. -she endorses taking calcium and vit D.

## 2023-04-23 ENCOUNTER — Ambulatory Visit (HOSPITAL_COMMUNITY)
Admission: RE | Admit: 2023-04-23 | Discharge: 2023-04-23 | Disposition: A | Discharge status: Home / Self Care | Payer: Medicare Other | Source: Ambulatory Visit | Attending: Hematology | Admitting: Hematology

## 2023-04-23 DIAGNOSIS — Z170 Estrogen receptor positive status [ER+]: Secondary | ICD-10-CM | POA: Diagnosis present

## 2023-04-23 DIAGNOSIS — C50212 Malignant neoplasm of upper-inner quadrant of left female breast: Secondary | ICD-10-CM | POA: Insufficient documentation

## 2023-04-27 ENCOUNTER — Telehealth: Payer: Self-pay

## 2023-04-27 NOTE — Telephone Encounter (Addendum)
Called patient and relayed message below as per Dr. Mosetta Putt, patient voiced full understanding.  ----- Message from Malachy Mood, MD sent at 04/27/2023  2:55 PM EDT ----- Please call pt and let her know the CT scan showed stable benign appearing left parietal bone lesion, no concerns, no need additional f/u. Thanks    Malachy Mood

## 2023-04-28 ENCOUNTER — Encounter (INDEPENDENT_AMBULATORY_CARE_PROVIDER_SITE_OTHER): Payer: Self-pay | Admitting: Family Medicine

## 2023-04-28 ENCOUNTER — Ambulatory Visit (INDEPENDENT_AMBULATORY_CARE_PROVIDER_SITE_OTHER): Payer: Medicare Other | Admitting: Family Medicine

## 2023-04-28 VITALS — BP 131/78 | HR 64 | Temp 98.3°F | Ht 67.0 in | Wt 181.0 lb

## 2023-04-28 DIAGNOSIS — E669 Obesity, unspecified: Secondary | ICD-10-CM

## 2023-04-28 DIAGNOSIS — I2584 Coronary atherosclerosis due to calcified coronary lesion: Secondary | ICD-10-CM

## 2023-04-28 DIAGNOSIS — I10 Essential (primary) hypertension: Secondary | ICD-10-CM | POA: Diagnosis not present

## 2023-04-28 DIAGNOSIS — I2510 Atherosclerotic heart disease of native coronary artery without angina pectoris: Secondary | ICD-10-CM | POA: Diagnosis not present

## 2023-04-28 DIAGNOSIS — Z6828 Body mass index (BMI) 28.0-28.9, adult: Secondary | ICD-10-CM

## 2023-04-28 MED ORDER — WEGOVY 0.25 MG/0.5ML ~~LOC~~ SOAJ
0.2500 mg | SUBCUTANEOUS | 0 refills | Status: DC
Start: 2023-04-28 — End: 2023-05-31

## 2023-04-28 NOTE — Progress Notes (Signed)
Chief Complaint:   OBESITY Aimee Mcclain is here to discuss her progress with her obesity treatment plan along with follow-up of her obesity related diagnoses. Aimee Mcclain is on keeping a food journal and adhering to recommended goals of 1450-1600 calories and 95+ grams of protein and states she is following her eating plan approximately 75% of the time. Aimee Mcclain states she is doing 0 minutes 0 times per week.  Today's visit was #: 11 Starting weight: 191 lbs Starting date: 08/12/2022 Today's weight: 181 lbs Today's date: 04/28/2023 Total lbs lost to date: 10 Total lbs lost since last in-office visit: 0  Interim History: Patient voices the the last month she did not feel she was as on plan as she would like.  She missed quite a few days of journaling and often went over calories.  She has been trying to stay realistic and honest with her total intake.  She has also recorded her BP since last appointment.  She has developed plantar fasciitis in her foot.   Subjective:   1. Essential hypertension Aimee Mcclain brought her log in today, and systolic blood pressure ranges between 117-135.  She is not on medications.  2. Coronary artery disease due to calcified coronary lesion In 2023, Aimee Mcclain's cardiac CT was showing 65% of calcium buildup; statin initiated.  Assessment/Plan:   1. Essential hypertension We will continue to monitor, and in no medications at this time.  2. Coronary artery disease due to calcified coronary lesion Aimee Mcclain agreed to start Wegovy at 0.25 mg subq. weekly with no refills.  - Semaglutide-Weight Management (WEGOVY) 0.25 MG/0.5ML SOAJ; Inject 0.25 mg into the skin once a week.  Dispense: 2 mL; Refill: 0  3. Obesity with current BMI of 28.4 Aimee Mcclain is currently in the action stage of change. As such, her goal is to continue with weight loss efforts. She has agreed to keeping a food journal and adhering to recommended goals of 1450-1600 calories and 95+ grams of protein daily.    Exercise goals: All adults should avoid inactivity. Some physical activity is better than none, and adults who participate in any amount of physical activity gain some health benefits.  Patient is to start yoga at home 3 times per week.  Behavioral modification strategies: increasing lean protein intake, meal planning and cooking strategies, keeping healthy foods in the home, and planning for success.  Aimee Mcclain has agreed to follow-up with our clinic in 3 to 4 weeks. She was informed of the importance of frequent follow-up visits to maximize her success with intensive lifestyle modifications for her multiple health conditions.   Objective:   Blood pressure 131/78, pulse 64, temperature 98.3 F (36.8 C), height 5\' 7"  (1.702 m), weight 181 lb (82.1 kg), SpO2 95 %. Body mass index is 28.35 kg/m.  General: Cooperative, alert, well developed, in no acute distress. HEENT: Conjunctivae and lids unremarkable. Cardiovascular: Regular rhythm.  Lungs: Normal work of breathing. Neurologic: No focal deficits.   Lab Results  Component Value Date   CREATININE 0.78 04/08/2023   BUN 21 04/08/2023   NA 142 04/08/2023   K 4.4 04/08/2023   CL 108 04/08/2023   CO2 30 04/08/2023   Lab Results  Component Value Date   ALT 22 04/08/2023   AST 23 04/08/2023   ALKPHOS 63 04/08/2023   BILITOT 0.5 04/08/2023   Lab Results  Component Value Date   HGBA1C 5.4 12/17/2022   HGBA1C 5.3 08/12/2022   HGBA1C 5.2 08/21/2020   Lab Results  Component  Value Date   INSULIN 7.9 12/17/2022   INSULIN 13.0 08/12/2022   Lab Results  Component Value Date   TSH 1.800 08/12/2022   Lab Results  Component Value Date   CHOL 151 12/17/2022   HDL 64 12/17/2022   LDLCALC 74 12/17/2022   TRIG 66 12/17/2022   CHOLHDL 4 02/12/2020   Lab Results  Component Value Date   VD25OH 60.9 12/17/2022   VD25OH 40.0 08/12/2022   Lab Results  Component Value Date   WBC 4.7 04/08/2023   HGB 13.8 04/08/2023   HCT 40.3  04/08/2023   MCV 90.8 04/08/2023   PLT 184 04/08/2023   No results found for: "IRON", "TIBC", "FERRITIN"  Attestation Statements:   Reviewed by clinician on day of visit: allergies, medications, problem list, medical history, surgical history, family history, social history, and previous encounter notes.   I, Burt Knack, am acting as transcriptionist for Reuben Likes, MD.  I have reviewed the above documentation for accuracy and completeness, and I agree with the above. - Reuben Likes, MD

## 2023-04-29 ENCOUNTER — Telehealth: Payer: Self-pay

## 2023-04-29 NOTE — Telephone Encounter (Signed)
error 

## 2023-04-29 NOTE — Telephone Encounter (Signed)
PA submitted through Cover My Meds for [facility] [facility]. Awaiting insurance determination. Key: [id number]

## 2023-05-03 ENCOUNTER — Encounter (INDEPENDENT_AMBULATORY_CARE_PROVIDER_SITE_OTHER): Payer: Self-pay

## 2023-05-25 ENCOUNTER — Ambulatory Visit (INDEPENDENT_AMBULATORY_CARE_PROVIDER_SITE_OTHER): Payer: Medicare Other

## 2023-05-25 VITALS — Ht 67.0 in | Wt 181.0 lb

## 2023-05-25 DIAGNOSIS — Z0000 Encounter for general adult medical examination without abnormal findings: Secondary | ICD-10-CM

## 2023-05-25 DIAGNOSIS — Z1211 Encounter for screening for malignant neoplasm of colon: Secondary | ICD-10-CM

## 2023-05-25 NOTE — Progress Notes (Signed)
I connected with  Carri Blazevich Piccione on 05/25/23 by a audio enabled telemedicine application and verified that I am speaking with the correct person using two identifiers.  Patient Location: Home  Provider Location: Home Office  I discussed the limitations of evaluation and management by telemedicine. The patient expressed understanding and agreed to proceed.  Subjective:   Aimee Mcclain is a 67 y.o. female who presents for Medicare Annual (Subsequent) preventive examination.  Review of Systems      Cardiac Risk Factors include: advanced age (>86men, >49 women);hypertension;sedentary lifestyle     Objective:    Today's Vitals   05/25/23 0812 05/25/23 0813  Weight: 181 lb (82.1 kg)   Height: 5\' 7"  (1.702 m)   PainSc:  5    Body mass index is 28.35 kg/m.     05/25/2023    8:28 AM 05/21/2022    8:57 AM 11/04/2021    7:17 AM 09/12/2021    8:05 AM 09/10/2021    9:36 AM 06/20/2021    1:26 PM 06/18/2021    8:50 AM  Advanced Directives  Does Patient Have a Medical Advance Directive? Yes No No Yes No No No  Type of Estate agent of [place];Living will   Healthcare Power of [place];Living will     Does patient want to make changes to medical advance directive?    No - Patient declined     Copy of Healthcare Power of Attorney in Chart? No - copy requested   No - copy requested     Would patient like information on creating a medical advance directive?  No - Patient declined No - Patient declined No - Patient declined No - Patient declined No - Patient declined No - Patient declined    Current Medications (verified) Outpatient Encounter Medications as of 05/25/2023  Medication Sig   acetaminophen (TYLENOL) 500 MG tablet Take 1,000 mg by mouth every 6 (six) hours as needed for moderate pain.   atorvastatin (LIPITOR) 20 MG tablet Take 1 tablet (20 mg total) by mouth daily.   Calcium Carbonate-Vitamin D (CALCIUM 600+D PO) Take 1 tablet by mouth daily.    Cholecalciferol (VITAMIN D3) 125 MCG (5000 UT) CAPS Take 1 capsule (5,000 Units total) by mouth daily.   Magnesium 400 MG TABS Take by mouth.   Multiple Vitamin (MULTIVITAMIN) tablet Take 1 tablet by mouth daily.   omeprazole (PRILOSEC) 20 MG capsule Take 20 mg by mouth daily.   tamoxifen (NOLVADEX) 20 MG tablet Take 1 tablet (20 mg total) by mouth daily.   valACYclovir (VALTREX) 500 MG tablet Take 1 tablet (500 mg total) by mouth 2 (two) times daily as needed.   Vitamin B Complex-C CAPS Take 1 capsule by mouth.   Semaglutide-Weight Management (WEGOVY) 0.25 MG/0.5ML SOAJ Inject 0.25 mg into the skin once a week.   No facility-administered encounter medications on file as of 05/25/2023.    Allergies (verified) Ibuprofen, Omnicef [cefdinir], Proton pump inhibitors, Roxicodone [oxycodone hcl], and Vioxx [rofecoxib]   History: Past Medical History:  Diagnosis Date   Arthritis    Breast cancer (HCC)    Cataract    Colon polyps    Endometriosis    Family history of colon cancer    Family history of colonic polyps    Fuchs' corneal dystrophy    GERD (gastroesophageal reflux disease)    H/O bilateral breast implants    Heartburn    High blood pressure    History of colon polyps  HPV in female    HSV (herpes simplex virus) infection    Migraine    no longer having migraines   Osteoarthritis    Osteopenia    Port-A-Cath in place 03/18/2021   S/P TAH (total abdominal hysterectomy) 1991   Vitamin D deficiency    Past Surgical History:  Procedure Laterality Date   ABDOMINAL HYSTERECTOMY     BREAST BIOPSY     Benign.   BREAST ENHANCEMENT SURGERY     BREAST LUMPECTOMY Bilateral 1993 and 1995   BREAST LUMPECTOMY WITH RADIOACTIVE SEED AND SENTINEL LYMPH NODE BIOPSY Left 02/13/2021   Procedure: LEFT BREAST LUMPECTOMY WITH RADIOACTIVE SEED AND SENTINEL LYMPH NODE BIOPSY WITH SEED TARGETED NODE;  Surgeon: Almond Lint, MD;  Location: [facility];  Service: General;   Laterality: Left;   CATARACT EXTRACTION, BILATERAL     CESAREAN SECTION     PARTIAL HYSTERECTOMY     PLACEMENT OF BREAST IMPLANTS  1993   PORT-A-CATH REMOVAL Right 11/04/2021   Procedure: REMOVAL PORT-A-CATH;  Surgeon: Almond Lint, MD;  Location: [facility];  Service: General;  Laterality: Right;   PORTACATH PLACEMENT Right 02/13/2021   Procedure: INSERTION PORT-A-CATH;  Surgeon: Almond Lint, MD;  Location: [facility];  Service: General;  Laterality: Right;   TONSILLECTOMY     WISDOM TOOTH EXTRACTION     Family History  Problem Relation Age of Onset   Dementia Maternal Grandmother    Heart disease Maternal Grandfather    Colon cancer Maternal Grandfather        dx over 41   Colon cancer Father        d. 36   Heart disease Mother    Colon cancer Mother        dx late 8s-70s   Rectal cancer Mother 65   Diabetes Brother    Colon polyps Brother    Diabetes Brother    Colon polyps Brother    Lymphoma Maternal Aunt    Heart disease Maternal Uncle    Crohn's disease Daughter    Esophageal cancer Neg Hx    Stomach cancer Neg Hx    Breast cancer Neg Hx    Social History   Socioeconomic History   Marital status: Married    Spouse name: Not on file   Number of children: 2   Years of education: some college   Highest education level: Not on file  Occupational History   Occupation: Airline pilot  Tobacco Use   Smoking status: Former    Packs/day: 0.50    Years: 10.00    Additional pack years: 0.00    Total pack years: 5.00    Types: Cigarettes    Quit date: 12/14/1993    Years since quitting: 29.4   Smokeless tobacco: Never  Vaping Use   Vaping Use: Never used  Substance and Sexual Activity   Alcohol use: Yes    Comment: very rare   Drug use: No   Sexual activity: Yes    Comment: hyst  Other Topics Concern   Not on file  Social History Narrative   Lives at home with husband.   Married 1988.   Right-handed.   Social Determinants of Health    Financial Resource Strain: Low Risk  (05/25/2023)   Overall Financial Resource Strain (CARDIA)    Difficulty of Paying Living Expenses: Not hard at all  Food Insecurity: No Food Insecurity (05/25/2023)   Hunger Vital Sign    Worried About Running Out of  Food in the Last Year: Never true    Ran Out of Food in the Last Year: Never true  Transportation Needs: No Transportation Needs (05/25/2023)   PRAPARE - Administrator, Civil Service (Medical): No    Lack of Transportation (Non-Medical): No  Physical Activity: Inactive (05/25/2023)   Exercise Vital Sign    Days of Exercise per Week: 0 days    Minutes of Exercise per Session: 0 min  Stress: No Stress Concern Present (05/25/2023)   [employer] of Occupational Health - Occupational Stress Questionnaire    Feeling of Stress : Not at all  Social Connections: Moderately Integrated (05/25/2023)   Social Connection and Isolation Panel [NHANES]    Frequency of Communication with Friends and Family: More than three times a week    Frequency of Social Gatherings with Friends and Family: More than three times a week    Attends Religious Services: Never    Database administrator or Organizations: Yes    Attends Engineer, structural: More than 4 times per year    Marital Status: Married    Tobacco Counseling Counseling given: Not Answered   Clinical Intake:  Pre-visit preparation completed: Yes  Pain : 0-10 Pain Score: 5  Pain Type: Acute pain Pain Location: Foot Pain Orientation: Right Pain Descriptors / Indicators: Aching Pain Onset: 1 to 4 weeks ago     Nutritional Risks: None Diabetes: No  How often do you need to have someone help you when you read instructions, pamphlets, or other written materials from your doctor or pharmacy?: 1 - Never  Diabetic? NO  Interpreter Needed?: No  Information entered by :: C.Linsey Arteaga LPN   Activities of Daily Living    05/25/2023    8:29 AM 05/21/2023    8:51 AM   In your present state of health, do you have any difficulty performing the following activities:  Hearing? 0 0  Vision? 0 0  Difficulty concentrating or making decisions? 0 0  Walking or climbing stairs? 0 0  Dressing or bathing? 0 0  Doing errands, shopping? 0 0  Preparing Food and eating ? N N  Using the Toilet? N Y  In the past six months, have you accidently leaked urine? Y Y  Comment occasionally when sneezes   Do you have problems with loss of bowel control? N N  Managing your Medications? N N  Managing your Finances? N N  Housekeeping or managing your Housekeeping? N N    Patient Care Team: Joaquim Nam, MD as PCP - General (Family Medicine) Wyline Mood Alben Spittle, MD as PCP - Cardiology (Cardiology) Almond Lint, MD as Consulting Physician (General Surgery) Malachy Mood, MD as Consulting Physician (Hematology) Lonie Peak, MD as Attending Physician (Radiation Oncology) Silverio Lay, MD as Consulting Physician (Obstetrics and Gynecology) Pollyann Samples, NP as Nurse Practitioner (Nurse Practitioner)  Indicate any recent Medical Services you may have received from other than [facility] providers in the past year (date may be approximate).     Assessment:   This is a routine wellness examination for Addasyn.  Hearing/Vision screen Hearing Screening - Comments:: No hearing issues Vision Screening - Comments:: Readers - [facility]  Dietary issues and exercise activities discussed: Current Exercise Habits: The patient does not participate in regular exercise at present, Exercise limited by: None identified   Goals Addressed             This Visit's Progress    Patient Stated  Lose weight.        Depression Screen    05/25/2023    8:27 AM 08/12/2022    7:20 AM 05/21/2022    8:53 AM 05/19/2021    4:08 PM  PHQ 2/9 Scores  PHQ - 2 Score 0 2 0 0  PHQ- 9 Score  11  0    Fall Risk    05/25/2023    8:29 AM 05/21/2023    8:51 AM 05/21/2022    8:56 AM  05/20/2022    7:27 PM  Fall Risk   Falls in the past year? 0 0 0 0  Number falls in past yr: 0 0 0 0  Injury with Fall? 0 0 0 0  Risk for fall due to : No Fall Risks  No Fall Risks   Follow up Falls prevention discussed;Falls evaluation completed       FALL RISK PREVENTION PERTAINING TO THE HOME:  Any stairs in or around the home? Yes  If so, are there any without handrails? No  Home free of loose throw rugs in walkways, pet beds, electrical cords, etc? Yes  Adequate lighting in your home to reduce risk of falls? Yes   ASSISTIVE DEVICES UTILIZED TO PREVENT FALLS:  Life alert? No  Use of a cane, walker or w/c? No  Grab bars in the bathroom? No  Shower chair or bench in shower? No  Elevated toilet seat or a handicapped toilet? No     Cognitive Function:        05/25/2023    8:31 AM 05/21/2022    8:58 AM  6CIT Screen  What Year? 0 points 0 points  What month? 0 points 0 points  What time? 0 points 0 points  Count back from 20 0 points 0 points  Months in reverse 0 points 0 points  Repeat phrase 0 points 0 points  Total Score 0 points 0 points    Immunizations Immunization History  Administered Date(s) Administered   COVID-19, mRNA, vaccine(Comirnaty)12 years and older 11/06/2022   Fluad Quad(high Dose 65+) 11/06/2022   Influenza, High Dose Seasonal PF 09/17/2021   Influenza, Quadrivalent, Recombinant, Inj, Pf 09/20/2019   Influenza,inj,Quad PF,6+ Mos 09/21/2020   PFIZER(Purple Top)SARS-COV-2 Vaccination 03/02/2020, 03/23/2020, 11/06/2020, 02/08/2022    TDAP status: Due, Education has been provided regarding the importance of this vaccine. Advised may receive this vaccine at local pharmacy or Health Dept. Aware to provide a copy of the vaccination record if obtained from local pharmacy or Health Dept. Verbalized acceptance and understanding.  Flu Vaccine status: Up to date  Pneumococcal vaccine status: Due, Education has been provided regarding the importance of this  vaccine. Advised may receive this vaccine at local pharmacy or Health Dept. Aware to provide a copy of the vaccination record if obtained from local pharmacy or Health Dept. Verbalized acceptance and understanding.  Covid-19 vaccine status: Completed vaccines  Qualifies for Shingles Vaccine? Yes   Zostavax completed No   Shingrix Completed?: No.    Education has been provided regarding the importance of this vaccine. Patient has been advised to call insurance company to determine out of pocket expense if they have not yet received this vaccine. Advised may also receive vaccine at local pharmacy or Health Dept. Verbalized acceptance and understanding.  Screening Tests Health Maintenance  Topic Date Due   DTaP/Tdap/Td (1 - Tdap) Never done   Zoster Vaccines- Shingrix (1 of 2) Never done   Pneumonia Vaccine 25+ Years old (1 of 1 -  PCV) Never done   COVID-19 Vaccine (6 - 2023-24 season) 01/01/2023   INFLUENZA VACCINE  07/15/2023   MAMMOGRAM  07/28/2023   Colonoscopy  10/11/2023   Medicare Annual Wellness (AWV)  05/24/2024   DEXA SCAN  Completed   Hepatitis C Screening  Completed   HPV VACCINES  Aged Out    Health Maintenance  Health Maintenance Due  Topic Date Due   DTaP/Tdap/Td (1 - Tdap) Never done   Zoster Vaccines- Shingrix (1 of 2) Never done   Pneumonia Vaccine 74+ Years old (1 of 1 - PCV) Never done   COVID-19 Vaccine (6 - 2023-24 season) 01/01/2023    Colorectal cancer screening: Type of screening: Colonoscopy. Completed 10/10/18. Repeat every 5 years  Mammogram status: Completed 12/2022 per pt. Repeat every year.  Bone Density status: Completed 06/24/21. Results reflect: Bone density results: OSTEOPENIA. Repeat every 2 years. OB/GYN follows  Lung Cancer Screening: (Low Dose CT Chest recommended if Age 63-80 years, 30 pack-year currently smoking OR have quit w/in 15years.) does not qualify.   Lung Cancer Screening Referral: no  Additional Screening:  Hepatitis C  Screening: does qualify; Completed 12/22/16  Vision Screening: Recommended annual ophthalmology exams for early detection of glaucoma and other disorders of the eye. Is the patient up to date with their annual eye exam?  Yes  Who is the provider or what is the name of the office in which the patient attends annual eye exams? [facility] If pt is not established with a provider, would they like to be referred to a provider to establish care? Yes .   Dental Screening: Recommended annual dental exams for proper oral hygiene  Community Resource Referral / Chronic Care Management: CRR required this visit?  No   CCM required this visit?  No      Plan:     I have personally reviewed and noted the following in the patient's chart:   Medical and social history Use of alcohol, tobacco or illicit drugs  Current medications and supplements including opioid prescriptions. Patient is not currently taking opioid prescriptions. Functional ability and status Nutritional status Physical activity Advanced directives List of other physicians Hospitalizations, surgeries, and ER visits in previous 12 months Vitals Screenings to include cognitive, depression, and falls Referrals and appointments  In addition, I have reviewed and discussed with patient certain preventive protocols, quality metrics, and best practice recommendations. A written personalized care plan for preventive services as well as general preventive health recommendations were provided to patient.     Maryan Puls, LPN   1/61/0960   Nurse Notes: order placed for colonoscopy.

## 2023-05-25 NOTE — Patient Instructions (Addendum)
Aimee Mcclain , Thank you for taking time to come for your Medicare Wellness Visit. I appreciate your ongoing commitment to your health goals. Please review the following plan we discussed and let me know if I can assist you in the future.   These are the goals we discussed:  Goals       Patient stated (pt-stated)      Stay active as possiable      Patient Stated      Lose weight.         This is a list of the screening recommended for you and due dates:  Health Maintenance  Topic Date Due   DTaP/Tdap/Td vaccine (1 - Tdap) Never done   Zoster (Shingles) Vaccine (1 of 2) Never done   Pneumonia Vaccine (1 of 1 - PCV) Never done   COVID-19 Vaccine (6 - 2023-24 season) 01/01/2023   Flu Shot  07/15/2023   Mammogram  07/28/2023   Colon Cancer Screening  10/11/2023   Medicare Annual Wellness Visit  05/24/2024   DEXA scan (bone density measurement)  Completed   Hepatitis C Screening  Completed   HPV Vaccine  Aged Out    Advanced directives: Please bring a copy of your health care power of attorney and living will to the office to be added to your chart at your convenience.   Conditions/risks identified: Aim for 30 minutes of exercise or brisk walking, 6-8 glasses of water, and 5 servings of fruits and vegetables each day.   Next appointment: Follow up in one year for your annual wellness visit 05/25/24 @ 8:15 televisit   Preventive Care 65 Years and Older, Female Preventive care refers to lifestyle choices and visits with your health care provider that can promote health and wellness. What does preventive care include? A yearly physical exam. This is also called an annual well check. Dental exams once or twice a year. Routine eye exams. Ask your health care provider how often you should have your eyes checked. Personal lifestyle choices, including: Daily care of your teeth and gums. Regular physical activity. Eating a healthy diet. Avoiding tobacco and drug use. Limiting  alcohol use. Practicing safe sex. Taking low-dose aspirin every day. Taking vitamin and mineral supplements as recommended by your health care provider. What happens during an annual well check? The services and screenings done by your health care provider during your annual well check will depend on your age, overall health, lifestyle risk factors, and family history of disease. Counseling  Your health care provider may ask you questions about your: Alcohol use. Tobacco use. Drug use. Emotional well-being. Home and relationship well-being. Sexual activity. Eating habits. History of falls. Memory and ability to understand (cognition). Work and work Astronomer. Reproductive health. Screening  You may have the following tests or measurements: Height, weight, and BMI. Blood pressure. Lipid and cholesterol levels. These may be checked every 5 years, or more frequently if you are over 48 years old. Skin check. Lung cancer screening. You may have this screening every year starting at age 84 if you have a 30-pack-year history of smoking and currently smoke or have quit within the past 15 years. Fecal occult blood test (FOBT) of the stool. You may have this test every year starting at age 53. Flexible sigmoidoscopy or colonoscopy. You may have a sigmoidoscopy every 5 years or a colonoscopy every 10 years starting at age 20. Hepatitis C blood test. Hepatitis B blood test. Sexually transmitted disease (STD) testing. Diabetes screening. This  is done by checking your blood sugar (glucose) after you have not eaten for a while (fasting). You may have this done every 1-3 years. Bone density scan. This is done to screen for osteoporosis. You may have this done starting at age 82. Mammogram. This may be done every 1-2 years. Talk to your health care provider about how often you should have regular mammograms. Talk with your health care provider about your test results, treatment options, and if  necessary, the need for more tests. Vaccines  Your health care provider may recommend certain vaccines, such as: Influenza vaccine. This is recommended every year. Tetanus, diphtheria, and acellular pertussis (Tdap, Td) vaccine. You may need a Td booster every 10 years. Zoster vaccine. You may need this after age 59. Pneumococcal 13-valent conjugate (PCV13) vaccine. One dose is recommended after age 82. Pneumococcal polysaccharide (PPSV23) vaccine. One dose is recommended after age 35. Talk to your health care provider about which screenings and vaccines you need and how often you need them. This information is not intended to replace advice given to you by your health care provider. Make sure you discuss any questions you have with your health care provider. Document Released: 12/27/2015 Document Revised: 08/19/2016 Document Reviewed: 10/01/2015 Elsevier Interactive Patient Education  2017 [place] Prevention in the Home Falls can cause injuries. They can happen to people of all ages. There are many things you can do to make your home safe and to help prevent falls. What can I do on the outside of my home? Regularly fix the edges of walkways and driveways and fix any cracks. Remove anything that might make you trip as you walk through a door, such as a raised step or threshold. Trim any bushes or trees on the path to your home. Use bright outdoor lighting. Clear any walking paths of anything that might make someone trip, such as rocks or tools. Regularly check to see if handrails are loose or broken. Make sure that both sides of any steps have handrails. Any raised decks and porches should have guardrails on the edges. Have any leaves, snow, or ice cleared regularly. Use sand or salt on walking paths during winter. Clean up any spills in your garage right away. This includes oil or grease spills. What can I do in the bathroom? Use night lights. Install grab bars by the toilet  and in the tub and shower. Do not use towel bars as grab bars. Use non-skid mats or decals in the tub or shower. If you need to sit down in the shower, use a plastic, non-slip stool. Keep the floor dry. Clean up any water that spills on the floor as soon as it happens. Remove soap buildup in the tub or shower regularly. Attach bath mats securely with double-sided non-slip rug tape. Do not have throw rugs and other things on the floor that can make you trip. What can I do in the bedroom? Use night lights. Make sure that you have a light by your bed that is easy to reach. Do not use any sheets or blankets that are too big for your bed. They should not hang down onto the floor. Have a firm chair that has side arms. You can use this for support while you get dressed. Do not have throw rugs and other things on the floor that can make you trip. What can I do in the kitchen? Clean up any spills right away. Avoid walking on wet floors. Keep items that  you use a lot in easy-to-reach places. If you need to reach something above you, use a strong step stool that has a grab bar. Keep electrical cords out of the way. Do not use floor polish or wax that makes floors slippery. If you must use wax, use non-skid floor wax. Do not have throw rugs and other things on the floor that can make you trip. What can I do with my stairs? Do not leave any items on the stairs. Make sure that there are handrails on both sides of the stairs and use them. Fix handrails that are broken or loose. Make sure that handrails are as long as the stairways. Check any carpeting to make sure that it is firmly attached to the stairs. Fix any carpet that is loose or worn. Avoid having throw rugs at the top or bottom of the stairs. If you do have throw rugs, attach them to the floor with carpet tape. Make sure that you have a light switch at the top of the stairs and the bottom of the stairs. If you do not have them, ask someone to add  them for you. What else can I do to help prevent falls? Wear shoes that: Do not have high heels. Have rubber bottoms. Are comfortable and fit you well. Are closed at the toe. Do not wear sandals. If you use a stepladder: Make sure that it is fully opened. Do not climb a closed stepladder. Make sure that both sides of the stepladder are locked into place. Ask someone to hold it for you, if possible. Clearly mark and make sure that you can see: Any grab bars or handrails. First and last steps. Where the edge of each step is. Use tools that help you move around (mobility aids) if they are needed. These include: Canes. Walkers. Scooters. Crutches. Turn on the lights when you go into a dark area. Replace any light bulbs as soon as they burn out. Set up your furniture so you have a clear path. Avoid moving your furniture around. If any of your floors are uneven, fix them. If there are any pets around you, be aware of where they are. Review your medicines with your doctor. Some medicines can make you feel dizzy. This can increase your chance of falling. Ask your doctor what other things that you can do to help prevent falls. This information is not intended to replace advice given to you by your health care provider. Make sure you discuss any questions you have with your health care provider. Document Released: 09/26/2009 Document Revised: 05/07/2016 Document Reviewed: 01/04/2015 Elsevier Interactive Patient Education  2017 [employer].

## 2023-05-27 ENCOUNTER — Ambulatory Visit (INDEPENDENT_AMBULATORY_CARE_PROVIDER_SITE_OTHER): Payer: Medicare Other | Admitting: Family Medicine

## 2023-05-27 ENCOUNTER — Encounter (INDEPENDENT_AMBULATORY_CARE_PROVIDER_SITE_OTHER): Payer: Self-pay | Admitting: Family Medicine

## 2023-05-27 VITALS — BP 133/80 | HR 68 | Temp 98.4°F | Ht 67.0 in | Wt 181.0 lb

## 2023-05-27 DIAGNOSIS — R632 Polyphagia: Secondary | ICD-10-CM | POA: Diagnosis not present

## 2023-05-27 DIAGNOSIS — E669 Obesity, unspecified: Secondary | ICD-10-CM | POA: Diagnosis not present

## 2023-05-27 DIAGNOSIS — Z6828 Body mass index (BMI) 28.0-28.9, adult: Secondary | ICD-10-CM | POA: Diagnosis not present

## 2023-05-27 NOTE — Progress Notes (Unsigned)
Chief Complaint:   OBESITY Aimee Mcclain is here to discuss her progress with her obesity treatment plan along with follow-up of her obesity related diagnoses. Aimee Mcclain is on keeping a food journal and adhering to recommended goals of 1450-1600 calories and 95+ grams of protein and states she is following her eating plan approximately 90% of the time. Aimee Mcclain states she is doing 0 minutes 0 times per week.  Today's visit was #: 12 Starting weight: 191 lbs Starting date: 08/12/2022 Today's weight: 181 lbs Today's date: 05/27/2023 Total lbs lost to date: 10 Total lbs lost since last in-office visit: 0  Interim History: Patient voices Medicare did not cover Wegovy.  Her snacking quantity is reduced but patient realizes that she snacks sometimes just to snack.  She also voices getting tired of eating salad and chicken with no other toppings.  She got tired of the breakfast options and swapped out kodiak waffles and eggwiches.  Biggest time to snack is after lunch.  Some days she is going over calories and her protein is around 80g per day.  She hasn't been able to do as much exercise as due to her plantar fasciitis.  She would like to get to the 160lb goal she set for herself.   Subjective:   1. Polyphagia Patient is feeling significant drive to snack especially in afternoon. Reginal Lutes is not covered. We discussed side effects and dosing of Qsymia.   Assessment/Plan:   1. Polyphagia We will revisit Qsymia at her next appointment; patient wants to research this at home.   2. BMI 28.0-28.9,adult  3. Obesity with starting BMI of 30.0 Aimee Mcclain is currently in the action stage of change. As such, her goal is to continue with weight loss efforts. She has agreed to keeping a food journal and adhering to recommended goals of 1450-1600 calories and 95+ grams of protein daily.   Exercise goals: No exercise has been prescribed at this time.  Behavioral modification strategies: increasing lean protein intake,  meal planning and cooking strategies, keeping healthy foods in the home, and planning for success.  Aimee Mcclain has agreed to follow-up with our clinic in 5 weeks. She was informed of the importance of frequent follow-up visits to maximize her success with intensive lifestyle modifications for her multiple health conditions.   Objective:   Blood pressure 133/80, pulse 68, temperature 98.4 F (36.9 C), height 5\' 7"  (1.702 m), weight 181 lb (82.1 kg), SpO2 100 %. Body mass index is 28.35 kg/m.  General: Cooperative, alert, well developed, in no acute distress. HEENT: Conjunctivae and lids unremarkable. Cardiovascular: Regular rhythm.  Lungs: Normal work of breathing. Neurologic: No focal deficits.   Lab Results  Component Value Date   CREATININE 0.78 04/08/2023   BUN 21 04/08/2023   NA 142 04/08/2023   K 4.4 04/08/2023   CL 108 04/08/2023   CO2 30 04/08/2023   Lab Results  Component Value Date   ALT 22 04/08/2023   AST 23 04/08/2023   ALKPHOS 63 04/08/2023   BILITOT 0.5 04/08/2023   Lab Results  Component Value Date   HGBA1C 5.4 12/17/2022   HGBA1C 5.3 08/12/2022   HGBA1C 5.2 08/21/2020   Lab Results  Component Value Date   INSULIN 7.9 12/17/2022   INSULIN 13.0 08/12/2022   Lab Results  Component Value Date   TSH 1.800 08/12/2022   Lab Results  Component Value Date   CHOL 151 12/17/2022   HDL 64 12/17/2022   LDLCALC 74 12/17/2022  TRIG 66 12/17/2022   CHOLHDL 4 02/12/2020   Lab Results  Component Value Date   VD25OH 60.9 12/17/2022   VD25OH 40.0 08/12/2022   Lab Results  Component Value Date   WBC 4.7 04/08/2023   HGB 13.8 04/08/2023   HCT 40.3 04/08/2023   MCV 90.8 04/08/2023   PLT 184 04/08/2023   No results found for: "IRON", "TIBC", "FERRITIN"  Attestation Statements:   Reviewed by clinician on day of visit: allergies, medications, problem list, medical history, surgical history, family history, social history, and previous encounter  notes.  Time spent on visit including pre-visit chart review and post-visit care and charting was 24 minutes.   I, Burt Knack, am acting as transcriptionist for Reuben Likes, MD.  I have reviewed the above documentation for accuracy and completeness, and I agree with the above. - Reuben Likes, MD

## 2023-05-31 ENCOUNTER — Ambulatory Visit (INDEPENDENT_AMBULATORY_CARE_PROVIDER_SITE_OTHER): Payer: Medicare Other | Admitting: Family Medicine

## 2023-05-31 VITALS — BP 130/70 | HR 74 | Temp 98.3°F | Ht 67.0 in | Wt 189.0 lb

## 2023-05-31 DIAGNOSIS — M7061 Trochanteric bursitis, right hip: Secondary | ICD-10-CM | POA: Diagnosis not present

## 2023-05-31 DIAGNOSIS — M79672 Pain in left foot: Secondary | ICD-10-CM | POA: Diagnosis not present

## 2023-05-31 DIAGNOSIS — M722 Plantar fascial fibromatosis: Secondary | ICD-10-CM

## 2023-05-31 MED ORDER — TRIAMCINOLONE ACETONIDE 40 MG/ML IJ SUSP
20.0000 mg | Freq: Once | INTRAMUSCULAR | Status: AC
Start: 2023-05-31 — End: 2023-05-31
  Administered 2023-05-31: 20 mg via INTRA_ARTICULAR

## 2023-05-31 NOTE — Progress Notes (Unsigned)
    Jackelin Correia T. Elih Mooney, MD, CAQ Sports Medicine [facility] at [facility] [address] [place], [address]  Phone: [phone]  FAX: [phone]  Aimee Mcclain - 67 y.o. female  MRN [id number]  Date of Birth: 18-Apr-1956  Date: 05/31/2023  PCP: Joaquim Nam, MD  Referral: Joaquim Nam, MD  Chief Complaint  Patient presents with   Foot Pain    Bilateral Heel Pain-Right is worse   Hip Pain    Right   Subjective:   Aimee Mcclain is a 67 y.o. very pleasant female patient with Body mass index is 29.6 kg/m. who presents with the following:  Patient presents with several MSK complaints.   R PF - ongoing for about several weeks, maybe 6 weeks  L heel   R lateral hip pain Feeling better some today  Alleve one bid sometimes  Arch binder Rehab  Plantar fascia injection, R   Review of Systems is noted in the HPI, as appropriate  Objective:   BP 130/70 (BP Location: Right Arm, Patient Position: Sitting, Cuff Size: Large)   Pulse 74   Temp 98.3 F (36.8 C) (Temporal)   Ht 5\' 7"  (1.702 m)   Wt 189 lb (85.7 kg)   SpO2 98%   BMI 29.60 kg/m   GEN: No acute distress; alert,appropriate. PULM: Breathing comfortably in no respiratory distress PSYCH: Normally interactive.   Laboratory and Imaging Data:  Assessment and Plan:   ***

## 2023-05-31 NOTE — Patient Instructions (Signed)

## 2023-06-01 ENCOUNTER — Encounter: Payer: Self-pay | Admitting: Family Medicine

## 2023-06-24 ENCOUNTER — Other Ambulatory Visit: Payer: Self-pay | Admitting: Obstetrics and Gynecology

## 2023-06-24 DIAGNOSIS — M8580 Other specified disorders of bone density and structure, unspecified site: Secondary | ICD-10-CM

## 2023-07-01 ENCOUNTER — Encounter (INDEPENDENT_AMBULATORY_CARE_PROVIDER_SITE_OTHER): Payer: Self-pay | Admitting: Family Medicine

## 2023-07-01 ENCOUNTER — Ambulatory Visit (INDEPENDENT_AMBULATORY_CARE_PROVIDER_SITE_OTHER): Payer: Medicare Other | Admitting: Family Medicine

## 2023-07-01 VITALS — BP 129/78 | HR 67 | Temp 97.8°F | Ht 67.0 in | Wt 182.0 lb

## 2023-07-01 DIAGNOSIS — E7849 Other hyperlipidemia: Secondary | ICD-10-CM

## 2023-07-01 DIAGNOSIS — E669 Obesity, unspecified: Secondary | ICD-10-CM

## 2023-07-01 DIAGNOSIS — Z6828 Body mass index (BMI) 28.0-28.9, adult: Secondary | ICD-10-CM | POA: Diagnosis not present

## 2023-07-01 DIAGNOSIS — F5089 Other specified eating disorder: Secondary | ICD-10-CM

## 2023-07-01 NOTE — Progress Notes (Unsigned)
Chief Complaint:   OBESITY Aimee Mcclain is here to discuss her progress with her obesity treatment plan along with follow-up of her obesity related diagnoses. Aimee Mcclain is on keeping a food journal and adhering to recommended goals of 1450-1600 calories and 95+ grams of protein and states she is following her eating plan approximately 50% of the time. Aimee Mcclain states she is being more active.    Today's visit was #: 13 Starting weight: 191 lbs Starting date: 08/12/2022 Today's weight: 182 lbs Today's date: 07/01/2023 Total lbs lost to date: 9 Total lbs lost since last in-office visit: 0  Interim History: Patient has been taking care of her mother who is sick and taking care of her mothers plants and house and dealing with her plantar fasciitis.  She did get an injection in her heel for this and this helped alleviate her pain. She has been logging everyday with exception for a few days over July 4th weekend when she had her grandkids.  More often than not she is going over her calorie allotment and she thinks this is partially due to logging at the end of the day and partially due to going out to lunch with her husband. She feels frustrated with herself about going over calories.  Leaving for [facility] August 10th and she is prepping to ensure everyone is on the same track.  She is doing this on top of her normal 10hr at her desk for work.   Subjective:   1. Other hyperlipidemia Patient is on Lipitor daily, with no side effects noted.  Her last LDL was 74.  2. Other disorder of eating We discussed phentermine until.  Me at patient's last appointment.  She has an upcoming vacation, and she does not want to start a medication before then.  Assessment/Plan:   1. Other hyperlipidemia Patient will continue Lipitor daily, with no change in medications or dose.  2. Other disorder of eating We will follow-up after patient's vacation to discuss medication options.  3. BMI 28.0-28.9,adult  4.  Obesity with starting BMI of 30.0 Aimee Mcclain is currently in the action stage of change. As such, her goal is to continue with weight loss efforts. She has agreed to keeping a food journal and adhering to recommended goals of 1450-1600 calories and 95+ grams of protein daily.   Exercise goals: All adults should avoid inactivity. Some physical activity is better than none, and adults who participate in any amount of physical activity gain some health benefits.  Behavioral modification strategies: increasing lean protein intake, meal planning and cooking strategies, keeping healthy foods in the home, planning for success, and keeping a strict food journal.  Aimee Mcclain has agreed to follow-up with our clinic in 5 weeks. She was informed of the importance of frequent follow-up visits to maximize her success with intensive lifestyle modifications for her multiple health conditions.   Objective:   Blood pressure 129/78, pulse 67, temperature 97.8 F (36.6 C), height 5\' 7"  (1.702 m), weight 182 lb (82.6 kg), SpO2 99%. Body mass index is 28.51 kg/m.  General: Cooperative, alert, well developed, in no acute distress. HEENT: Conjunctivae and lids unremarkable. Cardiovascular: Regular rhythm.  Lungs: Normal work of breathing. Neurologic: No focal deficits.   Lab Results  Component Value Date   CREATININE 0.78 04/08/2023   BUN 21 04/08/2023   NA 142 04/08/2023   K 4.4 04/08/2023   CL 108 04/08/2023   CO2 30 04/08/2023   Lab Results  Component Value Date  ALT 22 04/08/2023   AST 23 04/08/2023   ALKPHOS 63 04/08/2023   BILITOT 0.5 04/08/2023   Lab Results  Component Value Date   HGBA1C 5.4 12/17/2022   HGBA1C 5.3 08/12/2022   HGBA1C 5.2 08/21/2020   Lab Results  Component Value Date   INSULIN 7.9 12/17/2022   INSULIN 13.0 08/12/2022   Lab Results  Component Value Date   TSH 1.800 08/12/2022   Lab Results  Component Value Date   CHOL 151 12/17/2022   HDL 64 12/17/2022   LDLCALC 74  12/17/2022   TRIG 66 12/17/2022   CHOLHDL 4 02/12/2020   Lab Results  Component Value Date   VD25OH 60.9 12/17/2022   VD25OH 40.0 08/12/2022   Lab Results  Component Value Date   WBC 4.7 04/08/2023   HGB 13.8 04/08/2023   HCT 40.3 04/08/2023   MCV 90.8 04/08/2023   PLT 184 04/08/2023   No results found for: "IRON", "TIBC", "FERRITIN"  Attestation Statements:   Reviewed by clinician on day of visit: allergies, medications, problem list, medical history, surgical history, family history, social history, and previous encounter notes.   I, Burt Knack, am acting as transcriptionist for Reuben Likes, MD.  I have reviewed the above documentation for accuracy and completeness, and I agree with the above. - Reuben Likes, MD

## 2023-07-05 ENCOUNTER — Ambulatory Visit: Payer: Medicare Other | Attending: General Surgery | Admitting: Rehabilitation

## 2023-07-05 DIAGNOSIS — C50212 Malignant neoplasm of upper-inner quadrant of left female breast: Secondary | ICD-10-CM | POA: Insufficient documentation

## 2023-07-05 DIAGNOSIS — Z483 Aftercare following surgery for neoplasm: Secondary | ICD-10-CM | POA: Insufficient documentation

## 2023-07-05 DIAGNOSIS — Z170 Estrogen receptor positive status [ER+]: Secondary | ICD-10-CM | POA: Insufficient documentation

## 2023-07-05 NOTE — Therapy (Signed)
OUTPATIENT PHYSICAL THERAPY SOZO SCREENING NOTE   Patient Name: Aimee Mcclain MRN: [id number] DOB:Jun 12, 1956, 67 y.o., female Today's Date: 07/05/2023  PCP: Joaquim Nam, MD REFERRING PROVIDER: Almond Lint, MD   PT End of Session - 07/05/23 1025     Visit Number 3   screen   PT Start Time 1020    PT Stop Time 1025    PT Time Calculation (min) 5 min    Activity Tolerance Patient tolerated treatment well    Behavior During Therapy [facility] for tasks assessed/performed             Past Medical History:  Diagnosis Date   Arthritis    Breast cancer (HCC)    Cataract    Colon polyps    Endometriosis    Family history of colon cancer    Family history of colonic polyps    Fuchs' corneal dystrophy    GERD (gastroesophageal reflux disease)    H/O bilateral breast implants    Heartburn    High blood pressure    History of colon polyps    HPV in female    HSV (herpes simplex virus) infection    Migraine    no longer having migraines   Osteoarthritis    Osteopenia    Port-A-Cath in place 03/18/2021   S/P TAH (total abdominal hysterectomy) 1991   Vitamin D deficiency    Past Surgical History:  Procedure Laterality Date   ABDOMINAL HYSTERECTOMY     BREAST BIOPSY     Benign.   BREAST ENHANCEMENT SURGERY     BREAST LUMPECTOMY Bilateral 1993 and 1995   BREAST LUMPECTOMY WITH RADIOACTIVE SEED AND SENTINEL LYMPH NODE BIOPSY Left 02/13/2021   Procedure: LEFT BREAST LUMPECTOMY WITH RADIOACTIVE SEED AND SENTINEL LYMPH NODE BIOPSY WITH SEED TARGETED NODE;  Surgeon: Almond Lint, MD;  Location: [facility];  Service: General;  Laterality: Left;   CATARACT EXTRACTION, BILATERAL     CESAREAN SECTION     PARTIAL HYSTERECTOMY     PLACEMENT OF BREAST IMPLANTS  1993   PORT-A-CATH REMOVAL Right 11/04/2021   Procedure: REMOVAL PORT-A-CATH;  Surgeon: Almond Lint, MD;  Location: [facility];  Service: General;  Laterality: Right;   PORTACATH PLACEMENT Right 02/13/2021    Procedure: INSERTION PORT-A-CATH;  Surgeon: Almond Lint, MD;  Location: [facility];  Service: General;  Laterality: Right;   TONSILLECTOMY     WISDOM TOOTH EXTRACTION     Patient Active Problem List   Diagnosis Date Noted   Non-alcoholic fatty liver disease 06/24/2022   Endometriosis 06/24/2022   COVID-19 05/28/2022   Paronychia of great toe, right 01/16/2022   Pain of right great toe 01/16/2022   Loose toenail 01/16/2022   Leukopenia 12/17/2021   Exposure to COVID-19 virus 12/17/2021   HTN (hypertension) 06/22/2021   Herpes simplex 06/19/2021   Osteopenia 06/19/2021   Medicare welcome exam 05/21/2021   Malignant neoplasm of upper-inner quadrant of left breast in female, 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   Chronic migraine 08/21/2020   Screening mammogram, encounter for 06/30/2020   Paresthesia 06/09/2020   Neuropathy 02/14/2020   Atypical chest pain 02/14/2020   Healthcare maintenance 02/14/2020   Migraine    Advance care planning 01/16/2019   Family history of colon cancer     REFERRING DIAG: left breast cancer at risk for lymphedema  THERAPY DIAG: Aftercare following surgery for neoplasm  Malignant neoplasm of upper-inner quadrant of left breast in female, estrogen receptor positive (HCC)  PERTINENT HISTORY: Patient was diagnosed on 01/02/2021 with 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 Ki67 is 30%. Patient underwent a left lumpectomy and sentinel node biopsy (4 negative nodes) on 02/13/2021. Radiation complete 07/28/21   PRECAUTIONS: left UE Lymphedema risk, None  SUBJECTIVE: Pt returns for her 3 month L-Dex screen.   PAIN:  Are you having pain? No  SOZO SCREENING: Patient was assessed today using the SOZO machine to determine the lymphedema index score. This was compared to her baseline score. It was determined that she is within the recommended range when compared to her baseline and no further action is  needed at this time. She will continue SOZO screenings. These are done every 3 months for 2 years post operatively followed by every 6 months for 2 years, and then annually.   L-DEX FLOWSHEETS - 07/05/23 1000       L-DEX LYMPHEDEMA SCREENING   Measurement Type Unilateral    L-DEX MEASUREMENT EXTREMITY Upper Extremity    POSITION  Standing    DOMINANT SIDE Right    At Risk Side Left    BASELINE SCORE (UNILATERAL) 2.6    L-DEX SCORE (UNILATERAL) -2.5    VALUE CHANGE (UNILAT) -5.1              Skyleigh Windle R, PT 07/05/2023, 10:25 AM

## 2023-07-29 ENCOUNTER — Other Ambulatory Visit: Payer: Self-pay | Admitting: Internal Medicine

## 2023-08-02 ENCOUNTER — Ambulatory Visit (INDEPENDENT_AMBULATORY_CARE_PROVIDER_SITE_OTHER): Payer: Medicare Other | Admitting: Family Medicine

## 2023-08-02 ENCOUNTER — Encounter (INDEPENDENT_AMBULATORY_CARE_PROVIDER_SITE_OTHER): Payer: Self-pay | Admitting: Family Medicine

## 2023-08-02 ENCOUNTER — Encounter: Payer: Self-pay | Admitting: Family Medicine

## 2023-08-02 ENCOUNTER — Encounter: Payer: Self-pay | Admitting: Internal Medicine

## 2023-08-02 VITALS — BP 121/71 | HR 82 | Temp 98.4°F | Ht 67.0 in | Wt 186.0 lb

## 2023-08-02 DIAGNOSIS — E7849 Other hyperlipidemia: Secondary | ICD-10-CM | POA: Diagnosis not present

## 2023-08-02 DIAGNOSIS — Z6829 Body mass index (BMI) 29.0-29.9, adult: Secondary | ICD-10-CM

## 2023-08-02 DIAGNOSIS — E559 Vitamin D deficiency, unspecified: Secondary | ICD-10-CM

## 2023-08-02 DIAGNOSIS — E669 Obesity, unspecified: Secondary | ICD-10-CM

## 2023-08-02 DIAGNOSIS — F5089 Other specified eating disorder: Secondary | ICD-10-CM | POA: Diagnosis not present

## 2023-08-02 MED ORDER — LOMAIRA 8 MG PO TABS
ORAL_TABLET | ORAL | 0 refills | Status: DC
Start: 2023-08-02 — End: 2023-10-11

## 2023-08-02 MED ORDER — TOPIRAMATE 25 MG PO TABS
ORAL_TABLET | ORAL | 0 refills | Status: DC
Start: 2023-08-02 — End: 2023-10-11

## 2023-08-02 NOTE — Progress Notes (Signed)
Chief Complaint:   OBESITY Aimee Mcclain is here to discuss her progress with her obesity treatment plan along with follow-up of her obesity related diagnoses. Aimee Mcclain is on keeping a food journal and adhering to recommended goals of 1450-1600 calories and 95+ grams of protein and states she is following her eating plan approximately 0% of the time. Aimee Mcclain states she was hiking, climbing, walking, and horse riding for 2-3 hours 7 times for 1 week.    Today's visit was #: 14 Starting weight: 191 lbs Starting date: 08/12/2022 Today's weight: 186 lbs Today's date: 08/02/2023 Total lbs lost to date: 5 Total lbs lost since last in-office visit: 0  Interim History: Patient went on a trip to [facility] with her family since last appointment. She has not logged any food since last appointment. She mentions that the trip was amazing and everyone had a great time.  On the way back she got stuck in [place] overnight. She wants to start logging again and wants to get back to the [facility] to get more swimming in now that kids are going to be back in school.  Biggest obstacle in the next few weeks is herself.   Subjective:   1. Vitamin D deficiency Patient is on vitamin D OTC.  She denies nausea, vomiting, or muscle weakness but notes fatigue.  2. Other hyperlipidemia Patient is on Lipitor daily.  She denies transaminitis or myalgias.  3. Other disorder of eating Controlled substance contract was signed by the patient today, and PDMP was checked with no concerns.  Patient's starting weight is of 186 pounds, and she needs to lose 5% (9 pounds by early December).  Risks and benefits of both medications discussed.   Assessment/Plan:   1. Vitamin D deficiency Patient will continue OTC vitamin D, and we will repeat labs in November.  2. Other hyperlipidemia Patient will continue Lipitor with no change in medication or dose.  3. Other disorder of eating Patient agreed to start Lomaira at 4 mg and start  topiramate at 25 mg with no refills.  - Phentermine HCl (LOMAIRA) 8 MG TABS; Take 0.5 tablets (4 mg total) by mouth daily for 14 days, THEN 1 tablet (8 mg total) daily for 14 days.  Dispense: 23 tablet; Refill: 0 - topiramate (TOPAMAX) 25 MG tablet; Take 1 tablet (25 mg total) by mouth daily for 14 days, THEN 2 tablets (50 mg total) daily for 14 days.  Dispense: 45 tablet; Refill: 0  4. BMI 29.0-29.9,adult  5. Obesity with starting BMI of 30.0 Aimee Mcclain is currently in the action stage of change. As such, her goal is to continue with weight loss efforts. She has agreed to keeping a food journal and adhering to recommended goals of 1450-1600 calories and 95+ grams of protein daily.   Exercise goals: All adults should avoid inactivity. Some physical activity is better than none, and adults who participate in any amount of physical activity gain some health benefits.  Behavioral modification strategies: increasing lean protein intake, meal planning and cooking strategies, keeping healthy foods in the home, and planning for success.  Aimee Mcclain has agreed to follow-up with our clinic in 4 weeks. She was informed of the importance of frequent follow-up visits to maximize her success with intensive lifestyle modifications for her multiple health conditions.   Objective:   Blood pressure 121/71, pulse 82, temperature 98.4 F (36.9 C), height 5\' 7"  (1.702 m), weight 186 lb (84.4 kg), SpO2 98%. Body mass index is 29.13 kg/m.  General: Cooperative, alert, well developed, in no acute distress. HEENT: Conjunctivae and lids unremarkable. Cardiovascular: Regular rhythm.  Lungs: Normal work of breathing. Neurologic: No focal deficits.   Lab Results  Component Value Date   CREATININE 0.78 04/08/2023   BUN 21 04/08/2023   NA 142 04/08/2023   K 4.4 04/08/2023   CL 108 04/08/2023   CO2 30 04/08/2023   Lab Results  Component Value Date   ALT 22 04/08/2023   AST 23 04/08/2023   ALKPHOS 63 04/08/2023    BILITOT 0.5 04/08/2023   Lab Results  Component Value Date   HGBA1C 5.4 12/17/2022   HGBA1C 5.3 08/12/2022   HGBA1C 5.2 08/21/2020   Lab Results  Component Value Date   INSULIN 7.9 12/17/2022   INSULIN 13.0 08/12/2022   Lab Results  Component Value Date   TSH 1.800 08/12/2022   Lab Results  Component Value Date   CHOL 151 12/17/2022   HDL 64 12/17/2022   LDLCALC 74 12/17/2022   TRIG 66 12/17/2022   CHOLHDL 4 02/12/2020   Lab Results  Component Value Date   VD25OH 60.9 12/17/2022   VD25OH 40.0 08/12/2022   Lab Results  Component Value Date   WBC 4.7 04/08/2023   HGB 13.8 04/08/2023   HCT 40.3 04/08/2023   MCV 90.8 04/08/2023   PLT 184 04/08/2023   No results found for: "IRON", "TIBC", "FERRITIN"  Attestation Statements:   Reviewed by clinician on day of visit: allergies, medications, problem list, medical history, surgical history, family history, social history, and previous encounter notes.   I, Burt Knack, am acting as transcriptionist for Reuben Likes, MD.  I have reviewed the above documentation for accuracy and completeness, and I agree with the above. - Reuben Likes, MD

## 2023-08-05 ENCOUNTER — Telehealth (INDEPENDENT_AMBULATORY_CARE_PROVIDER_SITE_OTHER): Payer: Medicare Other | Admitting: Internal Medicine

## 2023-08-05 ENCOUNTER — Encounter: Payer: Self-pay | Admitting: Internal Medicine

## 2023-08-05 DIAGNOSIS — U071 COVID-19: Secondary | ICD-10-CM | POA: Diagnosis not present

## 2023-08-05 MED ORDER — NIRMATRELVIR/RITONAVIR (PAXLOVID)TABLET: 3.0000 | ORAL_TABLET | Freq: Two times a day (BID) | ORAL | 0 refills | Status: AC

## 2023-08-05 NOTE — Progress Notes (Signed)
Subjective:    Patient ID: Aimee Mcclain, female    DOB: [birth year], 67 y.o.   MRN: [id number]  HPI Video virtual visit for COVID infection Identification done Reviewed limitations and billing and she gave consent Participants--patient in her home office and I am in my office  2 days ago--slight sore throat Felt okay yesterday--then in the evening she felt congested Now more congestion and some cough No sinus problems No fever Some chills this AM. Slight aching in back of neck No headache No ear pain No SOB  Tried dayquil and nyquil yesterday---may have helped some (was able to sleep fine)  Lives with husband and daughter works with them in their house Recent flight back from [place] 4 days ago  Current Outpatient Medications on File Prior to Visit  Medication Sig Dispense Refill   acetaminophen (TYLENOL) 500 MG tablet Take 1,000 mg by mouth every 6 (six) hours as needed for moderate pain.     atorvastatin (LIPITOR) 20 MG tablet TAKE 1 TABLET BY MOUTH EVERY DAY 90 tablet 1   Calcium Carbonate-Vitamin D (CALCIUM 600+D PO) Take 1 tablet by mouth daily.     Cholecalciferol (VITAMIN D3) 125 MCG (5000 UT) CAPS Take 1 capsule (5,000 Units total) by mouth daily. 30 capsule 0   Multiple Vitamin (MULTIVITAMIN) tablet Take 1 tablet by mouth daily.     omeprazole (PRILOSEC) 20 MG capsule Take 20 mg by mouth daily.     tamoxifen (NOLVADEX) 20 MG tablet Take 1 tablet (20 mg total) by mouth daily. 90 tablet 3   valACYclovir (VALTREX) 500 MG tablet Take 1 tablet (500 mg total) by mouth 2 (two) times daily as needed.     Vitamin B Complex-C CAPS Take 1 capsule by mouth.     Magnesium 400 MG TABS Take by mouth. (Patient not taking: Reported on 08/05/2023)     Phentermine HCl (LOMAIRA) 8 MG TABS Take 0.5 tablets (4 mg total) by mouth daily for 14 days, THEN 1 tablet (8 mg total) daily for 14 days. (Patient not taking: Reported on 08/05/2023) 23 tablet 0   topiramate (TOPAMAX) 25 MG tablet  Take 1 tablet (25 mg total) by mouth daily for 14 days, THEN 2 tablets (50 mg total) daily for 14 days. (Patient not taking: Reported on 08/05/2023) 45 tablet 0   No current facility-administered medications on file prior to visit.    Allergies  Allergen Reactions   Ibuprofen Swelling    She has tolerated aleve   Omnicef [Cefdinir] Itching   Proton Pump Inhibitors     Headache, but Can tolerate prilosec   Roxicodone [Oxycodone Hcl] Rash    Developed rash on face and chest   Vioxx [Rofecoxib] Swelling and Rash    Past Medical History:  Diagnosis Date   Arthritis    Breast cancer (HCC)    Cataract    Colon polyps    Endometriosis    Family history of colon cancer    Family history of colonic polyps    Fuchs' corneal dystrophy    GERD (gastroesophageal reflux disease)    H/O bilateral breast implants    Heartburn    High blood pressure    History of colon polyps    HPV in female    HSV (herpes simplex virus) infection    Migraine    no longer having migraines   Osteoarthritis    Osteopenia    Port-A-Cath in place 03/18/2021   S/P TAH (total abdominal hysterectomy) 1991  Vitamin D deficiency     Past Surgical History:  Procedure Laterality Date   ABDOMINAL HYSTERECTOMY     BREAST BIOPSY     Benign.   BREAST ENHANCEMENT SURGERY     BREAST LUMPECTOMY Bilateral 1993 and 1995   BREAST LUMPECTOMY WITH RADIOACTIVE SEED AND SENTINEL LYMPH NODE BIOPSY Left 02/13/2021   Procedure: LEFT BREAST LUMPECTOMY WITH RADIOACTIVE SEED AND SENTINEL LYMPH NODE BIOPSY WITH SEED TARGETED NODE;  Surgeon: Almond Lint, MD;  Location: [facility];  Service: General;  Laterality: Left;   CATARACT EXTRACTION, BILATERAL     CESAREAN SECTION     PARTIAL HYSTERECTOMY     PLACEMENT OF BREAST IMPLANTS  1993   PORT-A-CATH REMOVAL Right 11/04/2021   Procedure: REMOVAL PORT-A-CATH;  Surgeon: Almond Lint, MD;  Location: [facility] OR;  Service: General;  Laterality: Right;   PORTACATH  PLACEMENT Right 02/13/2021   Procedure: INSERTION PORT-A-CATH;  Surgeon: Almond Lint, MD;  Location:  SURGERY CENTER;  Service: General;  Laterality: Right;   TONSILLECTOMY     WISDOM TOOTH EXTRACTION      Family History  Problem Relation Age of Onset   Dementia Maternal Grandmother    Heart disease Maternal Grandfather    Colon cancer Maternal Grandfather        dx over 21   Colon cancer Father        d. 59   Heart disease Mother    Colon cancer Mother        dx late 19s-70s   Rectal cancer Mother 31   Diabetes Brother    Colon polyps Brother    Diabetes Brother    Colon polyps Brother    Lymphoma Maternal Aunt    Heart disease Maternal Uncle    Crohn's disease Daughter    Esophageal cancer Neg Hx    Stomach cancer Neg Hx    Breast cancer Neg Hx     Social History   Socioeconomic History   Marital status: Married    Spouse name: Not on file   Number of children: 2   Years of education: some college   Highest education level: Some college, no degree  Occupational History   Occupation: Airline pilot  Tobacco Use   Smoking status: Former    Current packs/day: 0.00    Average packs/day: 0.5 packs/day for 10.0 years (5.0 ttl pk-yrs)    Types: Cigarettes    Start date: 12/15/1983    Quit date: 12/14/1993    Years since quitting: 29.6   Smokeless tobacco: Never  Vaping Use   Vaping status: Never Used  Substance and Sexual Activity   Alcohol use: Yes    Comment: very rare   Drug use: No   Sexual activity: Yes    Comment: hyst  Other Topics Concern   Not on file  Social History Narrative   Lives at home with husband.   Married 1988.   Right-handed.   Social Determinants of Health   Financial Resource Strain: Low Risk  (05/31/2023)   Overall Financial Resource Strain (CARDIA)    Difficulty of Paying Living Expenses: Not hard at all  Food Insecurity: No Food Insecurity (05/31/2023)   Hunger Vital Sign    Worried About Running Out of Food in the Last  Year: Never true    Ran Out of Food in the Last Year: Never true  Transportation Needs: No Transportation Needs (05/31/2023)   PRAPARE - Administrator, Civil Service (Medical): No  Lack of Transportation (Non-Medical): No  Physical Activity: Insufficiently Active (05/31/2023)   Exercise Vital Sign    Days of Exercise per Week: 1 day    Minutes of Exercise per Session: 10 min  Stress: No Stress Concern Present (05/31/2023)   [employer] of Occupational Health - Occupational Stress Questionnaire    Feeling of Stress : Only a little  Social Connections: Moderately Integrated (05/31/2023)   Social Connection and Isolation Panel [NHANES]    Frequency of Communication with Friends and Family: More than three times a week    Frequency of Social Gatherings with Friends and Family: Twice a week    Attends Religious Services: Never    Database administrator or Organizations: Yes    Attends Engineer, structural: More than 4 times per year    Marital Status: Married  Catering manager Violence: Not At Risk (05/25/2023)   Humiliation, Afraid, Rape, and Kick questionnaire    Fear of Current or Ex-Partner: No    Emotionally Abused: No    Physically Abused: No    Sexually Abused: No   Review of Systems No loss of smell or taste No N/V Eating okay    Objective:   Physical Exam Constitutional:      Appearance: Normal appearance.  Pulmonary:     Effort: Pulmonary effort is normal. No respiratory distress.  Neurological:     Mental Status: She is alert.            Assessment & Plan:

## 2023-08-05 NOTE — Assessment & Plan Note (Signed)
Mild infection but she is interested in antiviral due to her age, etc Will prescribe paxlovid---hold atorvastatin while on Can use analgesics and OTC cough meds prn To ER if sig worsening or SOB Works from home----isolate till next week, then mask when out for a week

## 2023-08-30 ENCOUNTER — Encounter: Payer: Self-pay | Admitting: Gastroenterology

## 2023-09-06 ENCOUNTER — Ambulatory Visit (INDEPENDENT_AMBULATORY_CARE_PROVIDER_SITE_OTHER): Payer: Medicare Other | Admitting: Family Medicine

## 2023-09-06 ENCOUNTER — Encounter (INDEPENDENT_AMBULATORY_CARE_PROVIDER_SITE_OTHER): Payer: Self-pay | Admitting: Family Medicine

## 2023-09-06 VITALS — BP 128/75 | HR 65 | Temp 98.3°F | Ht 67.0 in | Wt 189.0 lb

## 2023-09-06 DIAGNOSIS — E7849 Other hyperlipidemia: Secondary | ICD-10-CM

## 2023-09-06 DIAGNOSIS — F5089 Other specified eating disorder: Secondary | ICD-10-CM

## 2023-09-06 DIAGNOSIS — E669 Obesity, unspecified: Secondary | ICD-10-CM

## 2023-09-06 DIAGNOSIS — Z6829 Body mass index (BMI) 29.0-29.9, adult: Secondary | ICD-10-CM

## 2023-09-06 NOTE — Progress Notes (Unsigned)
Chief Complaint:   OBESITY Aimee Mcclain is here to discuss her progress with her obesity treatment plan along with follow-up of her obesity related diagnoses. Aimee Mcclain is on keeping a food journal and adhering to recommended goals of 1450-1600 calories and 95+ grams of protein and states she is following her eating plan approximately 25% of the time. Aimee Mcclain states she is walking a lot.    Today's visit was #: 15 Starting weight: 191 lbs Starting date: 08/12/2022 Today's weight: 189 lbs Today's date: 09/06/2023 Total lbs lost to date: 2 Total lbs lost since last in-office visit: 0  Interim History: Patient has had two weekends of indulgent eating and drinking and recognizes that the frequency of those has sabotaged her.  She has a trip to [place] in April that she is hoping to motivate her.  She has a colonoscopy coming up on October 7th.  Hasn't started her medication yet but she will after her colonoscopy. Between the next few weeks she realizes she needs to go out less frequently for food.  She is going to be home the next few weeks.  Subjective:   1. Other hyperlipidemia Patient is on Lipitor daily, and she denies myalgias or transaminitis.  2. Other disorder of eating Patient has not started phentermine/topiramate.  Assessment/Plan:   1. Other hyperlipidemia Patient will continue Lipitor with no change in dose.  2. Other disorder of eating Patient is to start her medications by October 8th, and follow-up in 3 weeks after starting medications.  3. BMI 29.0-29.9,adult  4. Obesity with starting BMI of 30.0 Aimee Mcclain is currently in the action stage of change. As such, her goal is to continue with weight loss efforts. She has agreed to keeping a food journal and adhering to recommended goals of 1450-1600 calories and 95+ grams of protein daily.   Exercise goals: All adults should avoid inactivity. Some physical activity is better than none, and adults who participate in any amount of  physical activity gain some health benefits.  Behavioral modification strategies: increasing lean protein intake, meal planning and cooking strategies, keeping healthy foods in the home, and planning for success.  Aimee Mcclain has agreed to follow-up with our clinic in 6 weeks. She was informed of the importance of frequent follow-up visits to maximize her success with intensive lifestyle modifications for her multiple health conditions.   Objective:   Blood pressure 128/75, pulse 65, temperature 98.3 F (36.8 C), height 5\' 7"  (1.702 m), weight 189 lb (85.7 kg), SpO2 99%. Body mass index is 29.6 kg/m.  General: Cooperative, alert, well developed, in no acute distress. HEENT: Conjunctivae and lids unremarkable. Cardiovascular: Regular rhythm.  Lungs: Normal work of breathing. Neurologic: No focal deficits.   Lab Results  Component Value Date   CREATININE 0.78 04/08/2023   BUN 21 04/08/2023   NA 142 04/08/2023   K 4.4 04/08/2023   CL 108 04/08/2023   CO2 30 04/08/2023   Lab Results  Component Value Date   ALT 22 04/08/2023   AST 23 04/08/2023   ALKPHOS 63 04/08/2023   BILITOT 0.5 04/08/2023   Lab Results  Component Value Date   HGBA1C 5.4 12/17/2022   HGBA1C 5.3 08/12/2022   HGBA1C 5.2 08/21/2020   Lab Results  Component Value Date   INSULIN 7.9 12/17/2022   INSULIN 13.0 08/12/2022   Lab Results  Component Value Date   TSH 1.800 08/12/2022   Lab Results  Component Value Date   CHOL 151 12/17/2022   HDL  64 12/17/2022   LDLCALC 74 12/17/2022   TRIG 66 12/17/2022   CHOLHDL 4 02/12/2020   Lab Results  Component Value Date   VD25OH 60.9 12/17/2022   VD25OH 40.0 08/12/2022   Lab Results  Component Value Date   WBC 4.7 04/08/2023   HGB 13.8 04/08/2023   HCT 40.3 04/08/2023   MCV 90.8 04/08/2023   PLT 184 04/08/2023   No results found for: "IRON", "TIBC", "FERRITIN"  Attestation Statements:   Reviewed by clinician on day of visit: allergies, medications,  problem list, medical history, surgical history, family history, social history, and previous encounter notes.   I, Burt Knack, am acting as transcriptionist for Reuben Likes, MD.  I have reviewed the above documentation for accuracy and completeness, and I agree with the above. -  ***

## 2023-09-10 ENCOUNTER — Ambulatory Visit (AMBULATORY_SURGERY_CENTER): Payer: Medicare Other | Admitting: *Deleted

## 2023-09-10 VITALS — Ht 67.0 in | Wt 191.0 lb

## 2023-09-10 DIAGNOSIS — Z860100 Personal history of colon polyps, unspecified: Secondary | ICD-10-CM

## 2023-09-10 DIAGNOSIS — Z800 Family history of malignant neoplasm of digestive organs: Secondary | ICD-10-CM

## 2023-09-10 MED ORDER — NA SULFATE-K SULFATE-MG SULF 17.5-3.13-1.6 GM/177ML PO SOLN: 1.0000 | Freq: Once | ORAL | 0 refills | Status: AC

## 2023-09-10 NOTE — Progress Notes (Signed)
Pt's name and DOB verified at the beginning of the pre-visit.  Pt denies any difficulty with ambulating,sitting, laying down or rolling side to side Gave both [facility] main # and MD on call # prior to instructions.  No egg or soy allergy known to patient  No issues known to pt with past sedation with any surgeries or procedures Pt denies having issues being intubated Pt has no issues moving head neck or swallowing No FH of Malignant Hyperthermia Pt is not on diet pills Pt is not on home 02  Pt is not on blood thinners  Pt has frequent issues with constipation RN instructed pt to use Miralax per bottles instructions a week before prep days. Pt states they will Pt is not on dialysis Pt denise any abnormal heart rhythms  Pt denies any upcoming cardiac testing Pt encouraged to use to use Singlecare or Goodrx to reduce cost  Patient's chart reviewed by Cathlyn Parsons CNRA prior to pre-visit and patient appropriate for the [facility].  Pre-visit completed and red dot placed by patient's name on their procedure day (on provider's schedule).  . Visit by phone Pt states weight is 191 lb Instructed pt why it is important to and  to call if they have any changes in health or new medications. Directed them to the # given and on instructions.   Pt states they will.  Instructions reviewed with pt and pt states understanding. Instructed to review again prior to procedure. Pt states they will.  Instructions sent by mail with coupon and by my chart

## 2023-09-20 ENCOUNTER — Ambulatory Visit (AMBULATORY_SURGERY_CENTER): Payer: Medicare Other | Admitting: Gastroenterology

## 2023-09-20 ENCOUNTER — Encounter: Payer: Self-pay | Admitting: Gastroenterology

## 2023-09-20 VITALS — BP 151/66 | HR 61 | Temp 98.0°F | Resp 13 | Ht 67.0 in | Wt 191.0 lb

## 2023-09-20 DIAGNOSIS — D123 Benign neoplasm of transverse colon: Secondary | ICD-10-CM

## 2023-09-20 DIAGNOSIS — Z09 Encounter for follow-up examination after completed treatment for conditions other than malignant neoplasm: Secondary | ICD-10-CM

## 2023-09-20 DIAGNOSIS — D125 Benign neoplasm of sigmoid colon: Secondary | ICD-10-CM

## 2023-09-20 DIAGNOSIS — Z860100 Personal history of colon polyps, unspecified: Secondary | ICD-10-CM | POA: Diagnosis not present

## 2023-09-20 DIAGNOSIS — Z800 Family history of malignant neoplasm of digestive organs: Secondary | ICD-10-CM

## 2023-09-20 DIAGNOSIS — K642 Third degree hemorrhoids: Secondary | ICD-10-CM

## 2023-09-20 MED ORDER — SODIUM CHLORIDE 0.9 % IV SOLN
500.0000 mL | INTRAVENOUS | Status: DC
Start: 2023-09-20 — End: 2023-09-20

## 2023-09-20 NOTE — Progress Notes (Signed)
To pacu, VSS. Report to [email or web address] 

## 2023-09-20 NOTE — Patient Instructions (Addendum)
-   Resume previous diet. - Continue present medications. - Await pathology results. - Repeat colonoscopy in 5 years for surveillance, or potentially sooner based on pathology results. - Return to GI office PRN.  YOU HAD AN ENDOSCOPIC PROCEDURE TODAY AT THE [facility]:   Refer to the procedure report that was given to you for any specific questions about what was found during the examination.  If the procedure report does not answer your questions, please call your gastroenterologist to clarify.  If you requested that your care partner not be given the details of your procedure findings, then the procedure report has been included in a sealed envelope for you to review at your convenience later.  YOU SHOULD EXPECT: Some feelings of bloating in the abdomen. Passage of more gas than usual.  Walking can help get rid of the air that was put into your GI tract during the procedure and reduce the bloating. If you had a lower endoscopy (such as a colonoscopy or flexible sigmoidoscopy) you may notice spotting of blood in your stool or on the toilet paper. If you underwent a bowel prep for your procedure, you may not have a normal bowel movement for a few days.  Please Note:  You might notice some irritation and congestion in your nose or some drainage.  This is from the oxygen used during your procedure.  There is no need for concern and it should clear up in a day or so.  SYMPTOMS TO REPORT IMMEDIATELY:  Following lower endoscopy (colonoscopy or flexible sigmoidoscopy):  Excessive amounts of blood in the stool  Significant tenderness or worsening of abdominal pains  Swelling of the abdomen that is new, acute  Fever of 100F or higher  For urgent or emergent issues, a gastroenterologist can be reached at any hour by calling [phone]. Do not use MyChart messaging for urgent concerns.    DIET:  We do recommend a small meal at first, but then you may proceed to your regular diet.   Drink plenty of fluids but you should avoid alcoholic beverages for 24 hours.  ACTIVITY:  You should plan to take it easy for the rest of today and you should NOT DRIVE or use heavy machinery until tomorrow (because of the sedation medicines used during the test).    FOLLOW UP: Our staff will call the number listed on your records the next business day following your procedure.  We will call around 7:15- 8:00 am to check on you and address any questions or concerns that you may have regarding the information given to you following your procedure. If we do not reach you, we will leave a message.     If any biopsies were taken you will be contacted by phone or by letter within the next 1-3 weeks.  Please call us at [phone] if you have not heard about the biopsies in 3 weeks.    SIGNATURES/CONFIDENTIALITY: You and/or your care partner have signed paperwork which will be entered into your electronic medical record.  These signatures attest to the fact that that the information above on your After Visit Summary has been reviewed and is understood.  Full responsibility of the confidentiality of this discharge information lies with you and/or your care-partner.

## 2023-09-20 NOTE — Op Note (Signed)
[facility] Patient Name: Aimee Mcclain Procedure Date: 09/20/2023 9:36 AM MRN: [id number] Endoscopist: Doristine Locks , MD, [id number] Age: 67 Referring MD:  Date of Birth: 09/21/1956 Gender: Female Account #: [id number] Procedure:                Colonoscopy Indications:              Screening in patient at increased risk: Colorectal                            cancer in mother 57 or older, Screening in patient                            at increased risk: Colorectal cancer in father 15                            or older, High risk colon cancer surveillance:                            Personal history of colonic polyps                           Family history notable for mother, father, and                            grandparent with colon cancer and brother w/ colon                            polyps and daughter with Crohn's Disease.                           Last colonsocopy was 2019 and notable for small                            rectal HP, several small scattered erosions (right                            colon>left colon). Path showed mild active                            inflammation Medicines:                Monitored Anesthesia Care Procedure:                Pre-Anesthesia Assessment:                           - Prior to the procedure, a History and Physical                            was performed, and patient medications and                            allergies were reviewed. The patient's tolerance of  previous anesthesia was also reviewed. The risks                            and benefits of the procedure and the sedation                            options and risks were discussed with the patient.                            All questions were answered, and informed consent                            was obtained. Prior Anticoagulants: The patient has                            taken no anticoagulant or antiplatelet agents. ASA                             Grade Assessment: II - A patient with mild systemic                            disease. After reviewing the risks and benefits,                            the patient was deemed in satisfactory condition to                            undergo the procedure.                           After obtaining informed consent, the colonoscope                            was passed under direct vision. Throughout the                            procedure, the patient's blood pressure, pulse, and                            oxygen saturations were monitored continuously. The                            CF [id number] #[id number] was introduced through the anus                            and advanced to the the terminal ileum. The                            colonoscopy was performed without difficulty. The                            patient tolerated the procedure well. The quality  of the bowel preparation was good. The terminal                            ileum, ileocecal valve, appendiceal orifice, and                            rectum were photographed. Scope In: 9:46:24 AM Scope Out: 10:08:30 AM Scope Withdrawal Time: 0 hours 14 minutes 59 seconds  Total Procedure Duration: 0 hours 22 minutes 6 seconds  Findings:                 Hemorrhoids were found on perianal exam.                           Two sessile polyps were found in the sigmoid colon                            and proximal transverse colon. The polyps were 3 to                            4 mm in size. These polyps were removed with a cold                            snare. Resection and retrieval were complete.                            Estimated blood loss was minimal.                           Non-bleeding internal hemorrhoids were found during                            retroflexion. The hemorrhoids were medium-sized and                            Grade III (internal hemorrhoids that prolapse but                             require manual reduction).                           The terminal ileum appeared normal. Complications:            No immediate complications. Estimated Blood Loss:     Estimated blood loss was minimal. Impression:               - Hemorrhoids found on perianal exam.                           - Two 3 to 4 mm polyps in the sigmoid colon and in                            the proximal transverse colon, removed with a cold  snare. Resected and retrieved.                           - Non-bleeding internal hemorrhoids.                           - The examined portion of the ileum was normal. Recommendation:           - Patient has a contact number available for                            emergencies. The signs and symptoms of potential                            delayed complications were discussed with the                            patient. Return to normal activities tomorrow.                            Written discharge instructions were provided to the                            patient.                           - Resume previous diet.                           - Continue present medications.                           - Await pathology results.                           - Repeat colonoscopy in 5 years for surveillance,                            or potentially sooner based on pathology results.                           - Return to GI office PRN. Doristine Locks, MD 09/20/2023 10:15:14 AM

## 2023-09-20 NOTE — Progress Notes (Signed)
GASTROENTEROLOGY PROCEDURE H&P NOTE   Primary Care Physician: Joaquim Nam, MD    Reason for Procedure:  Colon Cancer screening, family history of colon cancer  Plan:    Colonoscopy  Patient is appropriate for endoscopic procedure(s) in the ambulatory ([facility]) setting.  The nature of the procedure, as well as the risks, benefits, and alternatives were carefully and thoroughly reviewed with the patient. Ample time for discussion and questions allowed. The patient understood, was satisfied, and agreed to proceed.     HPI: Aimee Mcclain is a 67 y.o. female who presents for colonoscopy for ongoing Colon Cancer screening.  No active GI symptoms.    Family history notable for mother, father, and grandparent with colon cancer and brother w/ colon polyps and daughter with Crohn's Disease.  Endoscopic History: - Colonoscopy 2014 Dr. Madilyn Fireman patchy area of mildly erythematous and granular mucosa found in the ascending colon.  Pathology from the colon showed "chronic, mildly active colitis, consistent with inflammatory bowel disease"   - Colonoscopy 2019 Dr. Christella Hartigan found small rectal HP, several small scattered erosions (right colon>left colon). Path showed mild active inflammation; ddx per pathologist includes infection, NSAIDs.   Recommended repeat in 5 years due to family history.  Past Medical History:  Diagnosis Date   Arthritis    Breast cancer (HCC)    Cataract    Colon polyps    Endometriosis    Family history of colon cancer    Family history of colonic polyps    Fuchs' corneal dystrophy    GERD (gastroesophageal reflux disease)    H/O bilateral breast implants    Heartburn    High blood pressure    History of colon polyps    HPV in female    HSV (herpes simplex virus) infection    Hyperlipidemia    Migraine    no longer having migraines   Osteoarthritis    Osteopenia    Osteopenia    Port-A-Cath in place 03/18/2021   S/P TAH (total abdominal hysterectomy) 1991    Vitamin D deficiency     Past Surgical History:  Procedure Laterality Date   ABDOMINAL HYSTERECTOMY     BREAST BIOPSY     Benign.   BREAST ENHANCEMENT SURGERY     BREAST LUMPECTOMY Bilateral 1993 and 1995   BREAST LUMPECTOMY WITH RADIOACTIVE SEED AND SENTINEL LYMPH NODE BIOPSY Left 02/13/2021   Procedure: LEFT BREAST LUMPECTOMY WITH RADIOACTIVE SEED AND SENTINEL LYMPH NODE BIOPSY WITH SEED TARGETED NODE;  Surgeon: Almond Lint, MD;  Location: [facility];  Service: General;  Laterality: Left;   CATARACT EXTRACTION, BILATERAL     CESAREAN SECTION     PARTIAL HYSTERECTOMY     PLACEMENT OF BREAST IMPLANTS  1993   PORT-A-CATH REMOVAL Right 11/04/2021   Procedure: REMOVAL PORT-A-CATH;  Surgeon: Almond Lint, MD;  Location: [facility];  Service: General;  Laterality: Right;   PORTACATH PLACEMENT Right 02/13/2021   Procedure: INSERTION PORT-A-CATH;  Surgeon: Almond Lint, MD;  Location: [facility];  Service: General;  Laterality: Right;   TONSILLECTOMY     WISDOM TOOTH EXTRACTION      Prior to Admission medications   Medication Sig Start Date End Date Taking? Authorizing Provider  acetaminophen (TYLENOL) 500 MG tablet Take 1,000 mg by mouth every 6 (six) hours as needed for moderate pain. Patient not taking: Reported on 09/10/2023    [provider]  atorvastatin (LIPITOR) 20 MG tablet TAKE 1 TABLET BY MOUTH EVERY  DAY 07/29/23   Maisie Fus, MD  Calcium Carbonate-Vitamin D (CALCIUM 600+D PO) Take 1 tablet by mouth daily.    [provider]  Cholecalciferol (VITAMIN D3) 125 MCG (5000 UT) CAPS Take 1 capsule (5,000 Units total) by mouth daily. 01/14/23   Langston Reusing, MD  Multiple Vitamin (MULTIVITAMIN) tablet Take 1 tablet by mouth daily.    [provider]  omeprazole (PRILOSEC) 20 MG capsule Take 20 mg by mouth daily.    [provider]  Phentermine HCl (LOMAIRA) 8 MG TABS Take 0.5 tablets (4 mg total) by mouth daily  for 14 days, THEN 1 tablet (8 mg total) daily for 14 days. Patient not taking: Reported on 08/05/2023 08/02/23 08/30/23  Langston Reusing, MD  tamoxifen (NOLVADEX) 20 MG tablet Take 1 tablet (20 mg total) by mouth daily. 04/08/23   Malachy Mood, MD  topiramate (TOPAMAX) 25 MG tablet Take 1 tablet (25 mg total) by mouth daily for 14 days, THEN 2 tablets (50 mg total) daily for 14 days. Patient not taking: Reported on 08/05/2023 08/02/23 08/30/23  Langston Reusing, MD  valACYclovir (VALTREX) 500 MG tablet Take 1 tablet (500 mg total) by mouth 2 (two) times daily as needed. 02/12/20   Joaquim Nam, MD  Vitamin B Complex-C CAPS Take 1 capsule by mouth.    [provider]    Current Outpatient Medications  Medication Sig Dispense Refill   acetaminophen (TYLENOL) 500 MG tablet Take 1,000 mg by mouth every 6 (six) hours as needed for moderate pain. (Patient not taking: Reported on 09/10/2023)     atorvastatin (LIPITOR) 20 MG tablet TAKE 1 TABLET BY MOUTH EVERY DAY 90 tablet 1   Calcium Carbonate-Vitamin D (CALCIUM 600+D PO) Take 1 tablet by mouth daily.     Cholecalciferol (VITAMIN D3) 125 MCG (5000 UT) CAPS Take 1 capsule (5,000 Units total) by mouth daily. 30 capsule 0   Multiple Vitamin (MULTIVITAMIN) tablet Take 1 tablet by mouth daily.     omeprazole (PRILOSEC) 20 MG capsule Take 20 mg by mouth daily.     Phentermine HCl (LOMAIRA) 8 MG TABS Take 0.5 tablets (4 mg total) by mouth daily for 14 days, THEN 1 tablet (8 mg total) daily for 14 days. (Patient not taking: Reported on 08/05/2023) 23 tablet 0   tamoxifen (NOLVADEX) 20 MG tablet Take 1 tablet (20 mg total) by mouth daily. 90 tablet 3   topiramate (TOPAMAX) 25 MG tablet Take 1 tablet (25 mg total) by mouth daily for 14 days, THEN 2 tablets (50 mg total) daily for 14 days. (Patient not taking: Reported on 08/05/2023) 45 tablet 0   valACYclovir (VALTREX) 500 MG tablet Take 1 tablet (500 mg total) by mouth 2 (two) times daily as needed.      Vitamin B Complex-C CAPS Take 1 capsule by mouth.     No current facility-administered medications for this visit.    Allergies as of 09/20/2023 - Review Complete 09/10/2023  Allergen Reaction Noted   Ibuprofen Swelling 05/23/2012   Omnicef [cefdinir] Itching 02/12/2020   Proton pump inhibitors  02/16/2020   Roxicodone [oxycodone hcl] Rash 04/09/2021   Vioxx [rofecoxib] Swelling and Rash 05/23/2012    Family History  Problem Relation Age of Onset   Dementia Maternal Grandmother    Heart disease Maternal Grandfather    Colon cancer Maternal Grandfather        dx over 47   Colon cancer Father  d. 68   Heart disease Mother    Colon cancer Mother        dx late 8s-70s   Rectal cancer Mother 43   Diabetes Brother    Colon polyps Brother    Diabetes Brother    Colon polyps Brother    Lymphoma Maternal Aunt    Heart disease Maternal Uncle    Crohn's disease Daughter    Esophageal cancer Neg Hx    Stomach cancer Neg Hx    Breast cancer Neg Hx     Social History   Socioeconomic History   Marital status: Married    Spouse name: Not on file   Number of children: 2   Years of education: some college   Highest education level: Some college, no degree  Occupational History   Occupation: Airline pilot  Tobacco Use   Smoking status: Former    Current packs/day: 0.00    Average packs/day: 0.5 packs/day for 10.0 years (5.0 ttl pk-yrs)    Types: Cigarettes    Start date: 12/15/1983    Quit date: 12/14/1993    Years since quitting: 29.7   Smokeless tobacco: Never  Vaping Use   Vaping status: Never Used  Substance and Sexual Activity   Alcohol use: Yes    Comment: very rare   Drug use: No   Sexual activity: Yes    Comment: hyst  Other Topics Concern   Not on file  Social History Narrative   Lives at home with husband.   Married 1988.   Right-handed.   Social Determinants of Health   Financial Resource Strain: Low Risk  (05/31/2023)   Overall Financial Resource  Strain (CARDIA)    Difficulty of Paying Living Expenses: Not hard at all  Food Insecurity: No Food Insecurity (05/31/2023)   Hunger Vital Sign    Worried About Running Out of Food in the Last Year: Never true    Ran Out of Food in the Last Year: Never true  Transportation Needs: No Transportation Needs (05/31/2023)   PRAPARE - Administrator, Civil Service (Medical): No    Lack of Transportation (Non-Medical): No  Physical Activity: Insufficiently Active (05/31/2023)   Exercise Vital Sign    Days of Exercise per Week: 1 day    Minutes of Exercise per Session: 10 min  Stress: No Stress Concern Present (05/31/2023)   [employer] of Occupational Health - Occupational Stress Questionnaire    Feeling of Stress : Only a little  Social Connections: Moderately Integrated (05/31/2023)   Social Connection and Isolation Panel [NHANES]    Frequency of Communication with Friends and Family: More than three times a week    Frequency of Social Gatherings with Friends and Family: Twice a week    Attends Religious Services: Never    Database administrator or Organizations: Yes    Attends Engineer, structural: More than 4 times per year    Marital Status: Married  Catering manager Violence: Not At Risk (05/25/2023)   Humiliation, Afraid, Rape, and Kick questionnaire    Fear of Current or Ex-Partner: No    Emotionally Abused: No    Physically Abused: No    Sexually Abused: No    Physical Exam: Vital signs in last 24 hours: @There  were no vitals taken for this visit. GEN: NAD EYE: Sclerae anicteric ENT: MMM CV: Non-tachycardic Pulm: CTA b/l GI: Soft, NT/ND NEURO:  Alert & Oriented x 3   Doristine Locks, DO [facility]  09/20/2023 9:02 AM

## 2023-09-20 NOTE — Addendum Note (Signed)
Addended by: Karl Bales B on: 09/20/2023 02:53 PM   Modules accepted: Orders

## 2023-09-20 NOTE — Progress Notes (Signed)
Called to room to assist during endoscopic procedure.  Patient ID and intended procedure confirmed with present staff. Received instructions for my participation in the procedure from the performing physician.  

## 2023-09-21 ENCOUNTER — Telehealth: Payer: Self-pay | Admitting: *Deleted

## 2023-09-21 NOTE — Telephone Encounter (Signed)
  Follow up Call-     09/20/2023    9:05 AM  Call back number  Post procedure Call Back phone  # [phone]  Permission to leave phone message Yes     Patient questions:  Do you have a fever, pain , or abdominal swelling? No. Pain Score  0 *  Have you tolerated food without any problems? Yes.    Have you been able to return to your normal activities? Yes.    Do you have any questions about your discharge instructions: Diet   No. Medications  No. Follow up visit  No.  Do you have questions or concerns about your Care? No.  Actions: * If pain score is 4 or above: No action needed, pain <4.

## 2023-09-22 LAB — SURGICAL PATHOLOGY

## 2023-09-28 NOTE — Telephone Encounter (Signed)
TC

## 2023-10-08 ENCOUNTER — Other Ambulatory Visit: Payer: Self-pay

## 2023-10-08 ENCOUNTER — Inpatient Hospital Stay: Payer: Medicare Other | Attending: Hematology

## 2023-10-08 ENCOUNTER — Ambulatory Visit (HOSPITAL_COMMUNITY)
Admission: RE | Admit: 2023-10-08 | Discharge: 2023-10-08 | Disposition: A | Discharge status: Home / Self Care | Payer: Medicare Other | Source: Ambulatory Visit | Attending: Adult Health | Admitting: Adult Health

## 2023-10-08 ENCOUNTER — Inpatient Hospital Stay (HOSPITAL_BASED_OUTPATIENT_CLINIC_OR_DEPARTMENT_OTHER): Payer: Medicare Other | Admitting: Adult Health

## 2023-10-08 VITALS — BP 142/84 | HR 84 | Temp 97.8°F | Resp 18 | Ht 67.0 in | Wt 188.6 lb

## 2023-10-08 DIAGNOSIS — Z170 Estrogen receptor positive status [ER+]: Secondary | ICD-10-CM

## 2023-10-08 DIAGNOSIS — R053 Chronic cough: Secondary | ICD-10-CM | POA: Diagnosis not present

## 2023-10-08 DIAGNOSIS — C50212 Malignant neoplasm of upper-inner quadrant of left female breast: Secondary | ICD-10-CM | POA: Insufficient documentation

## 2023-10-08 DIAGNOSIS — R052 Subacute cough: Secondary | ICD-10-CM | POA: Insufficient documentation

## 2023-10-08 DIAGNOSIS — Z9221 Personal history of antineoplastic chemotherapy: Secondary | ICD-10-CM | POA: Diagnosis not present

## 2023-10-08 DIAGNOSIS — K219 Gastro-esophageal reflux disease without esophagitis: Secondary | ICD-10-CM | POA: Diagnosis not present

## 2023-10-08 DIAGNOSIS — R232 Flushing: Secondary | ICD-10-CM | POA: Diagnosis not present

## 2023-10-08 DIAGNOSIS — Z87891 Personal history of nicotine dependence: Secondary | ICD-10-CM | POA: Insufficient documentation

## 2023-10-08 DIAGNOSIS — C50912 Malignant neoplasm of unspecified site of left female breast: Secondary | ICD-10-CM

## 2023-10-08 DIAGNOSIS — Z79810 Long term (current) use of selective estrogen receptor modulators (SERMs): Secondary | ICD-10-CM | POA: Insufficient documentation

## 2023-10-08 DIAGNOSIS — Z923 Personal history of irradiation: Secondary | ICD-10-CM | POA: Insufficient documentation

## 2023-10-08 LAB — CMP (CANCER CENTER ONLY)
ALT: 29 U/L (ref 0–44)
AST: 25 U/L (ref 15–41)
Albumin: 4.5 g/dL (ref 3.5–5.0)
Alkaline Phosphatase: 54 U/L (ref 38–126)
Anion gap: 6 (ref 5–15)
BUN: 25 mg/dL — ABNORMAL HIGH (ref 8–23)
CO2: 25 mmol/L (ref 22–32)
Calcium: 9.5 mg/dL (ref 8.9–10.3)
Chloride: 108 mmol/L (ref 98–111)
Creatinine: 0.91 mg/dL (ref 0.44–1.00)
GFR, Estimated: >60 mL/min (ref >60)
Glucose, Bld: 97 mg/dL (ref 70–99)
Potassium: 4.3 mmol/L (ref 3.5–5.1)
Sodium: 139 mmol/L (ref 135–145)
Total Bilirubin: 0.5 mg/dL (ref 0.3–1.2)
Total Protein: 7.1 g/dL (ref 6.5–8.1)

## 2023-10-08 LAB — CBC WITH DIFFERENTIAL (CANCER CENTER ONLY)
Abs Immature Granulocytes: 0.01 10*3/uL (ref 0.00–0.07)
Basophils Absolute: 0 10*3/uL (ref 0.0–0.1)
Basophils Relative: 1 %
Eosinophils Absolute: 0.1 10*3/uL (ref 0.0–0.5)
Eosinophils Relative: 1 %
HCT: 41.2 % (ref 36.0–46.0)
Hemoglobin: 14.1 g/dL (ref 12.0–15.0)
Immature Granulocytes: 0 %
Lymphocytes Relative: 30 %
Lymphs Abs: 1.4 10*3/uL (ref 0.7–4.0)
MCH: 30.6 pg (ref 26.0–34.0)
MCHC: 34.2 g/dL (ref 30.0–36.0)
MCV: 89.4 fL (ref 80.0–100.0)
Monocytes Absolute: 0.4 10*3/uL (ref 0.1–1.0)
Monocytes Relative: 9 %
Neutro Abs: 2.8 10*3/uL (ref 1.7–7.7)
Neutrophils Relative %: 59 %
Platelet Count: 170 10*3/uL (ref 150–400)
RBC: 4.61 MIL/uL (ref 3.87–5.11)
RDW: 12.4 % (ref 11.5–15.5)
WBC Count: 4.8 10*3/uL (ref 4.0–10.5)
nRBC: 0 % (ref 0.0–0.2)

## 2023-10-08 NOTE — Assessment & Plan Note (Signed)
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   Breast Cancer Recent negative biopsy and stable benign bone lesion on imaging. No clinical or radiographic signs of breast cancer recurrence.  Continue on tamoxifen.  Continue annual mammograms next due in January.  Chronic Cough New onset, non-productive cough, different from previous reflux cough. No recent illness or exacerbating factors identified. History of tobacco use and breast cancer. -Order chest X-ray to evaluate for potential lung pathology.  Gastroesophageal Reflux Disease (GERD) Previously caused cough, now well-controlled on daily PPI. -Continue PPI as prescribed.  Hot Flashes Frequent hot flashes likely secondary to Tamoxifen use. No specific triggers identified. -Consider changing time of Tamoxifen administration to potentially reduce hot flash intensity. -Continue non-pharmacologic management strategies (dressing in layers, using portable fans, avoiding potential triggers).  Hair Thinning Likely secondary to Tamoxifen use. No signs of breakage, but hair loss from the root noted. -Continue high-quality shampoo, satin or silk pillowcase, and avoidance of heat and potential hair breakage triggers.  Bone Density Scheduled for DEXA scan in March. Tamoxifen has protective effect on bones. -Continue with scheduled DEXA scan in March.  Return to clinic in 6 months for labs and follow-up with Dr. Mosetta Putt or sooner as needed based on xray results.

## 2023-10-08 NOTE — Progress Notes (Signed)
[facility] Cancer Follow up:    Joaquim Nam, MD [address] [place] [address]   DIAGNOSIS:  Cancer Staging  Malignant neoplasm of upper-inner quadrant of left breast in female, estrogen receptor positive (HCC) Staging form: Breast, AJCC 8th Edition - Clinical stage from 01/14/2020: Stage IB (cT1c, cN1, cM0, G3, ER+, PR+, HER2+) - Signed by Malachy Mood, MD on 01/21/2021 Stage prefix: Initial diagnosis - Pathologic stage from 02/13/2021: Stage IA (pT2, pN0, cM0, G3, ER+, PR+, HER2+) - Signed by Heilingoetter, Cassandra L, PA-C on 03/07/2021 Stage prefix: Initial diagnosis Nuclear grade: G3 Histologic grading system: 3 grade system   SUMMARY OF ONCOLOGIC HISTORY: Oncology History Overview Note  Cancer Staging Malignant neoplasm of upper-inner quadrant of left breast in female, estrogen receptor positive (HCC) Staging form: Breast, AJCC 8th Edition - Clinical stage from 01/14/2020: Stage IB (cT1c, cN1, cM0, G3, ER+, PR+, HER2+) - Signed by Malachy Mood, MD on 01/21/2021 Stage prefix: Initial diagnosis - Pathologic stage from 02/13/2021: Stage IA (pT2, pN0, cM0, G3, ER+, PR+, HER2+) - Signed by Heilingoetter, Cassandra L, PA-C on 03/07/2021 Stage prefix: Initial diagnosis Nuclear grade: G3 Histologic grading system: 3 grade system    Malignant neoplasm of upper-inner quadrant of left breast in female, estrogen receptor positive (HCC)  01/14/2020 Cancer Staging   Staging form: Breast, AJCC 8th Edition - Clinical stage from 01/14/2020: Stage IB (cT1c, cN1, cM0, G3, ER+, PR+, HER2+) - Signed by Malachy Mood, MD on 01/21/2021 Stage prefix: Initial diagnosis   01/02/2021 Mammogram   Left breast with 1.9x1x1.8 cm complex cyst at 12:00 position, 4cmfn, versus saloid mass with calcifications, irregulr, increased in size. Biopsy recommended.    01/13/2021 Initial Biopsy   Diagnosis 1. Breast, left, needle core biopsy, 11 o'clock, 4cmfn - INVASIVE DUCTAL CARCINOMA - SEE  COMMENT 2. Lymph node, needle/core biopsy, left axilla - FIBROVASCULAR AND ADIPOSE TISSUE - NO LYMPHOID TISSUE OR CARCINOMA IDENTIFIED Microscopic Comment 1. Based on the biopsy, the carcinoma appears Nottingham grade 3 of 3 and measures 1.2 cm in greatest linear extent. Prognostic markers (ER/PR/ki-67/HER2) are pending and will be reported in an addendum. Dr. Rayetta Pigg reviewed the case and agrees with the above diagnosis. These results were called to [facility] on January 14, 2021   01/13/2021 Receptors her2   1. PROGNOSTIC INDICATORS Results: IMMUNOHISTOCHEMICAL AND MORPHOMETRIC ANALYSIS PERFORMED MANUALLY The tumor cells are POSITIVE for Her2 (3+). Estrogen Receptor: 95%, POSITIVE, STRONG STAINING INTENSITY Progesterone Receptor: 85%, POSITIVE, STRONG STAINING INTENSITY Proliferation Marker Ki67: 30%   01/16/2021 Initial Diagnosis   Malignant neoplasm of upper-inner quadrant of left breast in female, estrogen receptor positive (HCC)   01/23/2021 Imaging   MRI Breast  IMPRESSION: 1. Known malignancy measuring 2.9 centimeters in the UPPER INNER QUADRANT of the LEFT breast. There is no evidence for involvement of the pectoralis muscle or implant. 2. No evidence for lymphadenopathy. 3. RIGHT breast is negative. 4. Bilateral retropectoral saline implants.   02/09/2021 Imaging   MRI Brain IMPRESSION: No evidence of intracranial metastatic disease.   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     02/13/2021 Surgery   LEFT BREAST LUMPECTOMY WITH RADIOACTIVE SEED AND SENTINEL LYMPH NODE BIOPSY WITH SEED TARGETED NODE and PAC Placed by Dr Donell Beers   02/13/2021 Pathology Results   FINAL MICROSCOPIC DIAGNOSIS:   A. BREAST, LEFT, LUMPECTOMY:  - Invasive ductal carcinoma, 2.6 cm, Nottingham grade 3 of 3.  - Ductal carcinoma in situ, high grade with central necrosis.  -  Margins of resection:       - Invasive carcinoma broadly involves the anterior and inferior  margins.  Invasive  carcinoma is focally < 1 mm from each the superior  and medial margins.       - DCIS is focally < 1 mm from the inferior margin.  DCIS is focally  1 mm from each the anterior and superior margins.  DCIS is focally 1-2  mm from the medial margin.  - Biopsy clip.  - See oncology table.   B. BREAST, LEFT, ANTERIOR TISSUE, EXCISION:  - Residual invasive and in-situ ductal carcinoma.  - Residual invasive ductal carcinoma broadly involves the posterior  margin.  - Residual DCIS is focally 1 mm from the posterior margin.   C. BREAST, LEFT, INFERIOR MARGIN, EXCISION:  - No carcinoma identified.   D. SENTINEL LYMPH NODE, LEFT AXILLARY #1, BIOPSY:  - No carcinoma identified in one lymph node (0/1).   E. SENTINEL LYMPH NODE, LEFT AXILLARY #2, BIOPSY:  - Biopsy site.  - No distinct nodal tissue identified.  - No carcinoma identified.   F. SENTINEL LYMPH NODE, LEFT AXILLARY #3, BIOPSY:  - No carcinoma identified in one lymph node (0/1).   G. SENTINEL LYMPH NODE, LEFT AXILLARY #4, BIOPSY:  - No carcinoma identified in one lymph node (0/1).    02/13/2021 Cancer Staging   Staging form: Breast, AJCC 8th Edition - Pathologic stage from 02/13/2021: Stage IA (pT2, pN0, cM0, G3, ER+, PR+, HER2+) - Signed by Heilingoetter, Cassandra L, PA-C on 03/07/2021 Stage prefix: Initial diagnosis Nuclear grade: G3 Histologic grading system: 3 grade system   03/18/2021 - 02/19/2022 Chemotherapy   Patient is on Treatment Plan : BREAST Paclitaxel + Trastuzumab q7d / Trastuzumab q21d     06/2021 - 07/2021 Radiation Therapy   Adjuvant radiation per Dr. Basilio Cairo   08/20/2021 Imaging   CT Chest w/o contrast  IMPRESSION: No definite evidence of pulmonary metastatic disease.   5 mm indeterminate right lower lobe pulmonary nodule. Comparison with prior examinations, if available, is recommended to determine chronicity. No follow-up needed if patient is low-risk. In absence of priors, Non-contrast chest CT can be considered  in 12 months if patient is high-risk. This recommendation follows the consensus statement: Guidelines for Management of Incidental Pulmonary Nodules Detected on CT Images: From the Fleischner Society 2017; Radiology 2017; 284:228-243.   Mild coronary artery calcification.   08/20/2021 Imaging   CT Head w/o contrast  IMPRESSION: No acute abnormality. No evidence of intracranial metastatic disease.   08/2021 -  Anti-estrogen oral therapy   Began adjuvant exemestane; changed to tamoxifen 02/2022   11/13/2021 Survivorship   SCP delivered by Santiago Glad, NP     CURRENT THERAPY: Tamoxifen  INTERVAL HISTORY: Syreeta Brautigan Rost 67 y.o. female returns for follow-up of her history of breast cancer currently on tamoxifen therapy.  Her most recent mammogram occurred on January 06, 2023.  She underwent bilateral breast diagnostic mammogram that showed no mammographic abnormality and breast density category C.  She had a concern of right lower inner breast pain at this mammogram and ultrasound was recommended.  An ultrasound identified a 0.7 cm mass and biopsy was recommended.  Biopsy occurred on January 12, 2023 at [facility] mammography demonstrating pseudo angiomatous stromal hyperplasia, no atypia or malignancy.  She underwent bone scan that showed concern for possible parietal lesion.  CT head was completed on Apr 23, 2023 demonstrating a stable benign-appearing bone lesion in the left parietal bone.  Aimee Mcclain  is experiencing a persistent cough that has been ongoing for a significant period. The cough is described as a minor, non-congestive cough that occurs in the middle of a breath, typically one or two coughs at a time. The patient also reports occasional episodes of breathlessness, which have been ongoing for a while. She denies recent illness and has a remote history of tobacco use, having quit smoking thirty years ago. The patient is currently on medication for reflux, which has largely resolved her  symptoms. She distinguishes her current cough from the cough she previously experienced due to reflux, stating that the current cough is different.  The patient also reports experiencing hot flashes throughout the day, which she says she suffers through. She is currently on tamoxifen, which she takes at 10 in the morning. She has tried changing the time of day she takes the medication, but has not noticed a significant difference in the frequency or intensity of the hot flashes.  In addition to these symptoms, the patient reports hair thinning, which she believes is related to her tamoxifen use. She has noticed that her hair is falling out by the root, and has been using high-quality shampoo and avoiding heat to reduce breakage.  The patient has a scheduled bone density scan in March, which has been delayed due to her OBG doctor changing offices. She has no new breast changes since her last mammogram, ultrasound, and biopsy, which were negative.    Patient Active Problem List   Diagnosis Date Noted   Non-alcoholic fatty liver disease 06/24/2022   Endometriosis 06/24/2022   COVID-19 virus infection 05/28/2022   Paronychia of great toe, right 01/16/2022   Pain of right great toe 01/16/2022   Loose toenail 01/16/2022   Leukopenia 12/17/2021   Exposure to COVID-19 virus 12/17/2021   HTN (hypertension) 06/22/2021   Herpes simplex 06/19/2021   Osteopenia 06/19/2021   Medicare welcome exam 05/21/2021   Malignant neoplasm of upper-inner quadrant of left breast in female, 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   Chronic migraine 08/21/2020   Screening mammogram, encounter for 06/30/2020   Paresthesia 06/09/2020   Neuropathy 02/14/2020   Atypical chest pain 02/14/2020   Healthcare maintenance 02/14/2020   Migraine    Advance care planning 01/16/2019   Family history of colon cancer     is allergic to ibuprofen, omnicef [cefdinir], proton pump inhibitors, roxicodone [oxycodone hcl], and  vioxx [rofecoxib].  MEDICAL HISTORY: Past Medical History:  Diagnosis Date   Arthritis    Breast cancer (HCC)    Cataract    Colon polyps    Endometriosis    Family history of colon cancer    Family history of colonic polyps    Fuchs' corneal dystrophy    GERD (gastroesophageal reflux disease)    H/O bilateral breast implants    Heartburn    High blood pressure    History of colon polyps    HPV in female    HSV (herpes simplex virus) infection    Hyperlipidemia    Migraine    no longer having migraines   Osteoarthritis    Osteopenia    Osteopenia    Port-A-Cath in place 03/18/2021   S/P TAH (total abdominal hysterectomy) 1991   Vitamin D deficiency     SURGICAL HISTORY: Past Surgical History:  Procedure Laterality Date   ABDOMINAL HYSTERECTOMY     BREAST BIOPSY     Benign.   BREAST ENHANCEMENT SURGERY     BREAST LUMPECTOMY Bilateral 1993 and 1995  BREAST LUMPECTOMY WITH RADIOACTIVE SEED AND SENTINEL LYMPH NODE BIOPSY Left 02/13/2021   Procedure: LEFT BREAST LUMPECTOMY WITH RADIOACTIVE SEED AND SENTINEL LYMPH NODE BIOPSY WITH SEED TARGETED NODE;  Surgeon: Almond Lint, MD;  Location: [facility];  Service: General;  Laterality: Left;   CATARACT EXTRACTION, BILATERAL     CESAREAN SECTION     PARTIAL HYSTERECTOMY     PLACEMENT OF BREAST IMPLANTS  1993   PORT-A-CATH REMOVAL Right 11/04/2021   Procedure: REMOVAL PORT-A-CATH;  Surgeon: Almond Lint, MD;  Location: [facility];  Service: General;  Laterality: Right;   PORTACATH PLACEMENT Right 02/13/2021   Procedure: INSERTION PORT-A-CATH;  Surgeon: Almond Lint, MD;  Location: [facility];  Service: General;  Laterality: Right;   TONSILLECTOMY     WISDOM TOOTH EXTRACTION      SOCIAL HISTORY: Social History   Socioeconomic History   Marital status: Married    Spouse name: Not on file   Number of children: 2   Years of education: some college   Highest education level: Some college, no  degree  Occupational History   Occupation: Airline pilot  Tobacco Use   Smoking status: Former    Current packs/day: 0.00    Average packs/day: 0.5 packs/day for 10.0 years (5.0 ttl pk-yrs)    Types: Cigarettes    Start date: 12/15/1983    Quit date: 12/14/1993    Years since quitting: 29.8   Smokeless tobacco: Never  Vaping Use   Vaping status: Never Used  Substance and Sexual Activity   Alcohol use: Yes    Comment: very rare   Drug use: No   Sexual activity: Yes    Comment: hyst  Other Topics Concern   Not on file  Social History Narrative   Lives at home with husband.   Married 1988.   Right-handed.   Social Determinants of Health   Financial Resource Strain: Low Risk  (05/31/2023)   Overall Financial Resource Strain (CARDIA)    Difficulty of Paying Living Expenses: Not hard at all  Food Insecurity: No Food Insecurity (05/31/2023)   Hunger Vital Sign    Worried About Running Out of Food in the Last Year: Never true    Ran Out of Food in the Last Year: Never true  Transportation Needs: No Transportation Needs (05/31/2023)   PRAPARE - Administrator, Civil Service (Medical): No    Lack of Transportation (Non-Medical): No  Physical Activity: Insufficiently Active (05/31/2023)   Exercise Vital Sign    Days of Exercise per Week: 1 day    Minutes of Exercise per Session: 10 min  Stress: No Stress Concern Present (05/31/2023)   [employer] of Occupational Health - Occupational Stress Questionnaire    Feeling of Stress : Only a little  Social Connections: Moderately Integrated (05/31/2023)   Social Connection and Isolation Panel [NHANES]    Frequency of Communication with Friends and Family: More than three times a week    Frequency of Social Gatherings with Friends and Family: Twice a week    Attends Religious Services: Never    Database administrator or Organizations: Yes    Attends Engineer, structural: More than 4 times per year    Marital Status:  Married  Catering manager Violence: Not At Risk (05/25/2023)   Humiliation, Afraid, Rape, and Kick questionnaire    Fear of Current or Ex-Partner: No    Emotionally Abused: No    Physically Abused: No  Sexually Abused: No    FAMILY HISTORY: Family History  Problem Relation Age of Onset   Dementia Maternal Grandmother    Heart disease Maternal Grandfather    Colon cancer Maternal Grandfather        dx over 33   Colon cancer Father        d. 24   Heart disease Mother    Colon cancer Mother        dx late 39s-70s   Rectal cancer Mother 21   Diabetes Brother    Colon polyps Brother    Diabetes Brother    Colon polyps Brother    Lymphoma Maternal Aunt    Heart disease Maternal Uncle    Crohn's disease Daughter    Esophageal cancer Neg Hx    Stomach cancer Neg Hx    Breast cancer Neg Hx     Review of Systems  Constitutional:  Negative for appetite change, chills, fatigue, fever and unexpected weight change.  HENT:   Negative for hearing loss, lump/mass and trouble swallowing.   Eyes:  Negative for eye problems and icterus.  Respiratory:  Positive for cough. Negative for chest tightness, hemoptysis, shortness of breath and wheezing.   Cardiovascular:  Negative for chest pain, leg swelling and palpitations.  Gastrointestinal:  Negative for abdominal distention, abdominal pain, constipation, diarrhea, nausea and vomiting.  Endocrine: Negative for hot flashes.  Genitourinary:  Negative for difficulty urinating.   Musculoskeletal:  Negative for arthralgias.  Skin:  Negative for itching and rash.  Neurological:  Negative for dizziness, extremity weakness, headaches and numbness.  Hematological:  Negative for adenopathy. Does not bruise/bleed easily.  Psychiatric/Behavioral:  Negative for depression. The patient is not nervous/anxious.       PHYSICAL EXAMINATION    Vitals:   10/08/23 1054  BP: (!) 142/84  Pulse: 84  Resp: 18  Temp: 97.8 F (36.6 C)  SpO2: 100%     Physical Exam Constitutional:      General: She is not in acute distress.    Appearance: Normal appearance. She is not toxic-appearing.  HENT:     Head: Normocephalic and atraumatic.     Mouth/Throat:     Mouth: Mucous membranes are moist.     Pharynx: Oropharynx is clear. No oropharyngeal exudate or posterior oropharyngeal erythema.  Eyes:     General: No scleral icterus. Cardiovascular:     Rate and Rhythm: Normal rate and regular rhythm.     Pulses: Normal pulses.     Heart sounds: Normal heart sounds.  Pulmonary:     Effort: Pulmonary effort is normal.     Breath sounds: Normal breath sounds.  Abdominal:     General: Abdomen is flat. Bowel sounds are normal. There is no distension.     Palpations: Abdomen is soft.     Tenderness: There is no abdominal tenderness.  Musculoskeletal:        General: No swelling.     Cervical back: Neck supple.  Lymphadenopathy:     Cervical: No cervical adenopathy.  Skin:    General: Skin is warm and dry.     Findings: No rash.  Neurological:     General: No focal deficit present.     Mental Status: She is alert.  Psychiatric:        Mood and Affect: Mood normal.        Behavior: Behavior normal.     LABORATORY DATA:  CBC    Component Value Date/Time   WBC 4.8  10/08/2023 1039   WBC 5.9 02/12/2020 1704   RBC 4.61 10/08/2023 1039   HGB 14.1 10/08/2023 1039   HCT 41.2 10/08/2023 1039   PLT 170 10/08/2023 1039   MCV 89.4 10/08/2023 1039   MCH 30.6 10/08/2023 1039   MCHC 34.2 10/08/2023 1039   RDW 12.4 10/08/2023 1039   LYMPHSABS 1.4 10/08/2023 1039   MONOABS 0.4 10/08/2023 1039   EOSABS 0.1 10/08/2023 1039   BASOSABS 0.0 10/08/2023 1039    CMP     Component Value Date/Time   NA 139 10/08/2023 1039   NA 144 12/17/2022 1225   K 4.3 10/08/2023 1039   CL 108 10/08/2023 1039   CO2 25 10/08/2023 1039   GLUCOSE 97 10/08/2023 1039   BUN 25 (H) 10/08/2023 1039   BUN 16 12/17/2022 1225   CREATININE 0.91 10/08/2023 1039    CALCIUM 9.5 10/08/2023 1039   PROT 7.1 10/08/2023 1039   PROT 6.8 12/17/2022 1225   ALBUMIN 4.5 10/08/2023 1039   ALBUMIN 4.6 12/17/2022 1225   AST 25 10/08/2023 1039   ALT 29 10/08/2023 1039   ALKPHOS 54 10/08/2023 1039   BILITOT 0.5 10/08/2023 1039   GFRNONAA >60 10/08/2023 1039     ASSESSMENT and THERAPY PLAN:   Malignant neoplasm of upper-inner quadrant of left breast in female, estrogen receptor positive (HCC) [facility] is a 67 year old woman with stage Ib ER positive HER2 positive left-sided breast cancer diagnosed in 2022 status postlumpectomy followed by adjuvant chemotherapy, maintenance trastuzumab, adjuvant radiation, and antiestrogen therapy with tamoxifen which began in September 2022.   Breast Cancer Recent negative biopsy and stable benign bone lesion on imaging. No clinical or radiographic signs of breast cancer recurrence.  Continue on tamoxifen.  Continue annual mammograms next due in January.  Chronic Cough New onset, non-productive cough, different from previous reflux cough. No recent illness or exacerbating factors identified. History of tobacco use and breast cancer. -Order chest X-ray to evaluate for potential lung pathology.  Gastroesophageal Reflux Disease (GERD) Previously caused cough, now well-controlled on daily PPI. -Continue PPI as prescribed.  Hot Flashes Frequent hot flashes likely secondary to Tamoxifen use. No specific triggers identified. -Consider changing time of Tamoxifen administration to potentially reduce hot flash intensity. -Continue non-pharmacologic management strategies (dressing in layers, using portable fans, avoiding potential triggers).  Hair Thinning Likely secondary to Tamoxifen use. No signs of breakage, but hair loss from the root noted. -Continue high-quality shampoo, satin or silk pillowcase, and avoidance of heat and potential hair breakage triggers.  Bone Density Scheduled for DEXA scan in March. Tamoxifen has  protective effect on bones. -Continue with scheduled DEXA scan in March.  Return to clinic in 6 months for labs and follow-up with Dr. Mosetta Putt or sooner as needed based on xray results.   All questions were answered. The patient knows to call the clinic with any problems, questions or concerns. We can certainly see the patient much sooner if necessary.  Total encounter time:30 minutes*in face-to-face visit time, chart review, lab review, care coordination, order entry, and documentation of the encounter time.   Lillard Anes, NP 10/08/23 12:05 PM Medical Oncology and Hematology [facility] [address] [place], [place] [address] Tel. [phone]    Fax. [phone]  *Total Encounter Time as defined by the Centers for Medicare and Medicaid Services includes, in addition to the face-to-face time of a patient visit (documented in the note above) non-face-to-face time: obtaining and reviewing outside history, ordering and reviewing medications, tests or  procedures, care coordination (communications with other health care professionals or caregivers) and documentation in the medical record.

## 2023-10-11 ENCOUNTER — Ambulatory Visit (INDEPENDENT_AMBULATORY_CARE_PROVIDER_SITE_OTHER): Payer: Medicare Other | Admitting: Family Medicine

## 2023-10-11 ENCOUNTER — Encounter (INDEPENDENT_AMBULATORY_CARE_PROVIDER_SITE_OTHER): Payer: Self-pay | Admitting: Family Medicine

## 2023-10-11 ENCOUNTER — Telehealth: Payer: Self-pay | Admitting: Hematology

## 2023-10-11 VITALS — BP 124/70 | HR 72 | Temp 97.7°F | Ht 67.0 in | Wt 184.0 lb

## 2023-10-11 DIAGNOSIS — Z6830 Body mass index (BMI) 30.0-30.9, adult: Secondary | ICD-10-CM

## 2023-10-11 DIAGNOSIS — E7849 Other hyperlipidemia: Secondary | ICD-10-CM | POA: Diagnosis not present

## 2023-10-11 DIAGNOSIS — F5089 Other specified eating disorder: Secondary | ICD-10-CM

## 2023-10-11 DIAGNOSIS — Z6828 Body mass index (BMI) 28.0-28.9, adult: Secondary | ICD-10-CM | POA: Diagnosis not present

## 2023-10-11 DIAGNOSIS — E669 Obesity, unspecified: Secondary | ICD-10-CM

## 2023-10-11 MED ORDER — TOPIRAMATE 25 MG PO TABS
50.0000 mg | ORAL_TABLET | Freq: Every day | ORAL | 0 refills | Status: DC
Start: 2023-10-11 — End: 2023-11-09

## 2023-10-11 MED ORDER — LOMAIRA 8 MG PO TABS
8.0000 mg | ORAL_TABLET | Freq: Every day | ORAL | 0 refills | Status: DC
Start: 2023-10-11 — End: 2023-11-09

## 2023-10-11 NOTE — Telephone Encounter (Signed)
 Left patient a vm regarding upcoming appointment

## 2023-10-11 NOTE — Progress Notes (Unsigned)
Chief Complaint:   OBESITY Aimee Mcclain is here to discuss her progress with her obesity treatment plan along with follow-up of her obesity related diagnoses. Aimee Mcclain is on keeping a food journal and adhering to recommended goals of 1450-1600 calories and 95+ grams of protein and states she is following her eating plan approximately 100% of the time. Aimee Mcclain states she is doing 0 minutes 0 times per week.  Today's visit was #: 16 Starting weight: 191 lbs Starting date: 08/12/2022 Today's weight: 184 lbs Today's date: 10/11/2023 Total lbs lost to date: 7 Total lbs lost since last in-office visit: 5  Interim History: Since last appointment she has been working steadily.  She had her checkup with her oncologist and had her colonoscopy. She started the combination of phentermine and topiramate.  She feels a bit thirst, may have had a few palpitations.    Subjective:   1. Other hyperlipidemia Patient's last LDL was 9 months ago and was at 74.  She is on Lipitor daily.  2. Other disorder of eating Patient is on combination phentermine and topiramate.  She notes some palpitations and some feelings of increased thirst.  Assessment/Plan:   1. Other hyperlipidemia Patient is to come in fasting for labs at her next appointment.  2. Other disorder of eating We will refill phentermine 8 mg and topiramate 50 mg for 1 month.  Patient is to break up her dose to 4 mg/25 mg in the AM and 4 mg/25 mg in the afternoon.  She is to notify me in 3 days if this changes her symptoms, and if it does not we will stop the medication.  - Phentermine HCl (LOMAIRA) 8 MG TABS; Take 1 tablet (8 mg total) by mouth daily.  Dispense: 30 tablet; Refill: 0 - topiramate (TOPAMAX) 25 MG tablet; Take 2 tablets (50 mg total) by mouth daily.  Dispense: 60 tablet; Refill: 0  3. BMI 28.0-28.9,adult  4. Obesity with starting BMI of 30.0 Aimee Mcclain is currently in the action stage of change. As such, her goal is to continue with  weight loss efforts. She has agreed to keeping a food journal and adhering to recommended goals of 1450-1600 calories and 95+ grams of protein daily.   Exercise goals: All adults should avoid inactivity. Some physical activity is better than none, and adults who participate in any amount of physical activity gain some health benefits.  Behavioral modification strategies: increasing lean protein intake, meal planning and cooking strategies, keeping healthy foods in the home, planning for success, and keeping a strict food journal.  Aimee Mcclain has agreed to follow-up with our clinic in 4 weeks. She was informed of the importance of frequent follow-up visits to maximize her success with intensive lifestyle modifications for her multiple health conditions.   Objective:   Blood pressure 124/70, pulse 72, temperature 97.7 F (36.5 C), height 5\' 7"  (1.702 m), weight 184 lb (83.5 kg), SpO2 100%. Body mass index is 28.82 kg/m.  General: Cooperative, alert, well developed, in no acute distress. HEENT: Conjunctivae and lids unremarkable. Cardiovascular: Regular rhythm.  Lungs: Normal work of breathing. Neurologic: No focal deficits.   Lab Results  Component Value Date   CREATININE 0.91 10/08/2023   BUN 25 (H) 10/08/2023   NA 139 10/08/2023   K 4.3 10/08/2023   CL 108 10/08/2023   CO2 25 10/08/2023   Lab Results  Component Value Date   ALT 29 10/08/2023   AST 25 10/08/2023   ALKPHOS 54 10/08/2023  BILITOT 0.5 10/08/2023   Lab Results  Component Value Date   HGBA1C 5.4 12/17/2022   HGBA1C 5.3 08/12/2022   HGBA1C 5.2 08/21/2020   Lab Results  Component Value Date   INSULIN 7.9 12/17/2022   INSULIN 13.0 08/12/2022   Lab Results  Component Value Date   TSH 1.800 08/12/2022   Lab Results  Component Value Date   CHOL 151 12/17/2022   HDL 64 12/17/2022   LDLCALC 74 12/17/2022   TRIG 66 12/17/2022   CHOLHDL 4 02/12/2020   Lab Results  Component Value Date   VD25OH 60.9  12/17/2022   VD25OH 40.0 08/12/2022   Lab Results  Component Value Date   WBC 4.8 10/08/2023   HGB 14.1 10/08/2023   HCT 41.2 10/08/2023   MCV 89.4 10/08/2023   PLT 170 10/08/2023   No results found for: "IRON", "TIBC", "FERRITIN"  Attestation Statements:   Reviewed by clinician on day of visit: allergies, medications, problem list, medical history, surgical history, family history, social history, and previous encounter notes.   I, Burt Knack, am acting as transcriptionist for Reuben Likes, MD.  I have reviewed the above documentation for accuracy and completeness, and I agree with the above. - Reuben Likes, MD

## 2023-10-14 ENCOUNTER — Encounter (INDEPENDENT_AMBULATORY_CARE_PROVIDER_SITE_OTHER): Payer: Self-pay | Admitting: Family Medicine

## 2023-10-14 NOTE — Telephone Encounter (Signed)
Please advise 

## 2023-10-18 ENCOUNTER — Encounter: Payer: Self-pay | Admitting: Adult Health

## 2023-10-18 ENCOUNTER — Telehealth: Payer: Self-pay

## 2023-10-18 NOTE — Telephone Encounter (Signed)
-----   Message from Noreene Filbert sent at 10/18/2023  1:06 PM EST ----- Please call patient and let her know xray results are normal.  If cough, would consider ordering CT chest due to breat cancer and tobacco history. ----- Message ----- From: Interface, Rad Results In Sent: 10/18/2023  11:24 AM EST To: Loa Socks, NP

## 2023-10-18 NOTE — Telephone Encounter (Signed)
Called and given below message. She verbalized understanding. She has a cough at times. She declines having a CT of the chest at this time. She will call the office back for questions/concerns. Or if she would like CT of the chest.

## 2023-11-08 ENCOUNTER — Encounter (INDEPENDENT_AMBULATORY_CARE_PROVIDER_SITE_OTHER): Payer: Self-pay | Admitting: Family Medicine

## 2023-11-09 ENCOUNTER — Ambulatory Visit: Payer: Medicare Other | Admitting: Internal Medicine

## 2023-11-09 ENCOUNTER — Telehealth: Payer: Self-pay | Admitting: Family Medicine

## 2023-11-09 VITALS — BP 146/86 | HR 72 | Temp 98.5°F | Ht 67.0 in | Wt 190.0 lb

## 2023-11-09 DIAGNOSIS — R030 Elevated blood-pressure reading, without diagnosis of hypertension: Secondary | ICD-10-CM

## 2023-11-09 NOTE — Telephone Encounter (Signed)
Okay---I will assess her at the OV

## 2023-11-09 NOTE — Progress Notes (Signed)
Subjective:    Patient ID: Aimee Mcclain, female    DOB: [birth year], 67 y.o.   MRN: [id number]  HPI Here due to elevated blood pressure With daughter Silvestre Mesi  Awoke this morning about 5:30 AM Mild headache Then got tingly feeling on right side of face--then the other side also Still with tingling all over face and head feels heavy BP 165/95 (had correlated cuff at weight loss doctor)  Noted twitchy eyes earlier Had been on phentermine and topiramate--but off for 4 weeks  Recent blood work okay  Goes to the gym 3 days a week--treadmill and weights Has had to rest some more recently Chest heaviness---better with some deep breaths (but had taken time off and just getting back) Gets winded going up hills/stairs also  Does check BP sporadically 120/85--140/80's Current Outpatient Medications on File Prior to Visit  Medication Sig Dispense Refill   acetaminophen (TYLENOL) 500 MG tablet Take 1,000 mg by mouth every 6 (six) hours as needed for moderate pain (pain score 4-6).     atorvastatin (LIPITOR) 20 MG tablet TAKE 1 TABLET BY MOUTH EVERY DAY 90 tablet 1   Calcium Carbonate-Vitamin D (CALCIUM 600+D PO) Take 1 tablet by mouth daily.     Cholecalciferol (VITAMIN D3) 125 MCG (5000 UT) CAPS Take 1 capsule (5,000 Units total) by mouth daily. 30 capsule 0   Multiple Vitamin (MULTIVITAMIN) tablet Take 1 tablet by mouth daily.     omeprazole (PRILOSEC) 20 MG capsule Take 20 mg by mouth daily.     tamoxifen (NOLVADEX) 20 MG tablet Take 1 tablet (20 mg total) by mouth daily. 90 tablet 3   valACYclovir (VALTREX) 500 MG tablet Take 1 tablet (500 mg total) by mouth 2 (two) times daily as needed.     Vitamin B Complex-C CAPS Take 1 capsule by mouth.     No current facility-administered medications on file prior to visit.    Allergies  Allergen Reactions   Ibuprofen Swelling    She has tolerated aleve   Omnicef [Cefdinir] Itching   Proton Pump Inhibitors     Headache, but Can  tolerate prilosec   Roxicodone [Oxycodone Hcl] Rash    Developed rash on face and chest   Vioxx [Rofecoxib] Swelling and Rash    Past Medical History:  Diagnosis Date   Arthritis    Breast cancer (HCC)    Cataract    Colon polyps    Endometriosis    Family history of colon cancer    Family history of colonic polyps    Fuchs' corneal dystrophy    GERD (gastroesophageal reflux disease)    H/O bilateral breast implants    Heartburn    High blood pressure    History of colon polyps    HPV in female    HSV (herpes simplex virus) infection    Hyperlipidemia    Migraine    no longer having migraines   Osteoarthritis    Osteopenia    Osteopenia    Port-A-Cath in place 03/18/2021   S/P TAH (total abdominal hysterectomy) 1991   Vitamin D deficiency     Past Surgical History:  Procedure Laterality Date   ABDOMINAL HYSTERECTOMY     BREAST BIOPSY     Benign.   BREAST ENHANCEMENT SURGERY     BREAST LUMPECTOMY Bilateral 1993 and 1995   BREAST LUMPECTOMY WITH RADIOACTIVE SEED AND SENTINEL LYMPH NODE BIOPSY Left 02/13/2021   Procedure: LEFT BREAST LUMPECTOMY WITH RADIOACTIVE SEED AND SENTINEL LYMPH NODE  BIOPSY WITH SEED TARGETED NODE;  Surgeon: Almond Lint, MD;  Location: [facility];  Service: General;  Laterality: Left;   CATARACT EXTRACTION, BILATERAL     CESAREAN SECTION     PARTIAL HYSTERECTOMY     PLACEMENT OF BREAST IMPLANTS  1993   PORT-A-CATH REMOVAL Right 11/04/2021   Procedure: REMOVAL PORT-A-CATH;  Surgeon: Almond Lint, MD;  Location: [facility];  Service: General;  Laterality: Right;   PORTACATH PLACEMENT Right 02/13/2021   Procedure: INSERTION PORT-A-CATH;  Surgeon: Almond Lint, MD;  Location: [facility];  Service: General;  Laterality: Right;   TONSILLECTOMY     WISDOM TOOTH EXTRACTION      Family History  Problem Relation Age of Onset   Dementia Maternal Grandmother    Heart disease Maternal Grandfather    Colon cancer Maternal  Grandfather        dx over 72   Colon cancer Father        d. 42   Heart disease Mother    Colon cancer Mother        dx late 64s-70s   Rectal cancer Mother 60   Diabetes Brother    Colon polyps Brother    Diabetes Brother    Colon polyps Brother    Lymphoma Maternal Aunt    Heart disease Maternal Uncle    Crohn's disease Daughter    Esophageal cancer Neg Hx    Stomach cancer Neg Hx    Breast cancer Neg Hx     Social History   Socioeconomic History   Marital status: Married    Spouse name: Not on file   Number of children: 2   Years of education: some college   Highest education level: Associate degree: occupational, Scientist, product/process development, or vocational program  Occupational History   Occupation: Airline pilot  Tobacco Use   Smoking status: Former    Current packs/day: 0.00    Average packs/day: 0.5 packs/day for 10.0 years (5.0 ttl pk-yrs)    Types: Cigarettes    Start date: 12/15/1983    Quit date: 12/14/1993    Years since quitting: 29.9   Smokeless tobacco: Never  Vaping Use   Vaping status: Never Used  Substance and Sexual Activity   Alcohol use: Yes    Comment: very rare   Drug use: No   Sexual activity: Yes    Comment: hyst  Other Topics Concern   Not on file  Social History Narrative   Lives at home with husband.   Married 1988.   Right-handed.   Social Determinants of Health   Financial Resource Strain: Low Risk  (11/09/2023)   Overall Financial Resource Strain (CARDIA)    Difficulty of Paying Living Expenses: Not hard at all  Food Insecurity: No Food Insecurity (11/09/2023)   Hunger Vital Sign    Worried About Running Out of Food in the Last Year: Never true    Ran Out of Food in the Last Year: Never true  Transportation Needs: No Transportation Needs (11/09/2023)   PRAPARE - Administrator, Civil Service (Medical): No    Lack of Transportation (Non-Medical): No  Physical Activity: Sufficiently Active (11/09/2023)   Exercise Vital Sign    Days of  Exercise per Week: 3 days    Minutes of Exercise per Session: 60 min  Stress: No Stress Concern Present (11/09/2023)   [employer] of Occupational Health - Occupational Stress Questionnaire    Feeling of Stress : Not at all  Social  Connections: Socially Integrated (11/09/2023)   Social Connection and Isolation Panel [NHANES]    Frequency of Communication with Friends and Family: More than three times a week    Frequency of Social Gatherings with Friends and Family: Once a week    Attends Religious Services: 1 to 4 times per year    Active Member of [employer] or Organizations: Yes    Attends Engineer, structural: More than 4 times per year    Marital Status: Married  Catering manager Violence: Not At Risk (05/25/2023)   Humiliation, Afraid, Rape, and Kick questionnaire    Fear of Current or Ex-Partner: No    Emotionally Abused: No    Physically Abused: No    Sexually Abused: No   Review of Systems Not sleeping well--nothing new Does have some daytime somnolence--but not this morning Trying to improve diet     Objective:   Physical Exam Constitutional:      Appearance: Normal appearance.  Cardiovascular:     Rate and Rhythm: Normal rate and regular rhythm.     Heart sounds: No murmur heard.    No gallop.  Pulmonary:     Effort: Pulmonary effort is normal.     Breath sounds: Normal breath sounds. No wheezing or rales.  Musculoskeletal:     Cervical back: Neck supple.     Right lower leg: No edema.     Left lower leg: No edema.  Lymphadenopathy:     Cervical: No cervical adenopathy.  Neurological:     Mental Status: She is alert and oriented to person, place, and time.     Cranial Nerves: Cranial nerves 2-12 are intact.     Motor: No weakness or tremor.     Gait: Gait normal.            Assessment & Plan:

## 2023-11-09 NOTE — Assessment & Plan Note (Signed)
BP Readings from Last 3 Encounters:  11/09/23 (!) 146/86  10/11/23 124/70  10/08/23 (!) 142/84   Was 165/95 Has facial tingling---but not consistent with TIA (bilateral and normal neuro exam) DOE has been evaluated by Dr Wyline Mood---- non obstructive coronary calcium Discussed Rx---she isn't in a rush to start something Will have her monitor at home ---daily or every other day. If persistently over 140/90---should consider trying somehting

## 2023-11-09 NOTE — Telephone Encounter (Signed)
Per appt notes pt already has appt with Dr Alphonsus Sias 11/26/24n at 11:15. Sending note to Dr Alphonsus Sias.

## 2023-11-09 NOTE — Telephone Encounter (Signed)
FYI: This call has been transferred to Access Nurse. Once the result note has been entered staff can address the message at that time.  Patient called in with the following symptoms:  Red Word:elevated blood pressure   Please advise at Mobile [phone] (mobile)  Message is routed to Provider Pool and [facility] Triage   Pt called in stating her BP was 163/95 & her cheeks, chin, forehead & lips has a tingle feeling. Pt also mentioned she has a headache as well. While we were on the phone, pt took her BP again, it was 151/94, pulse was 88. Scheduled pt with Dr. Alphonsus Sias for today, 11/26 @ 11:15am. Transferred pt to access nurse.

## 2023-11-14 ENCOUNTER — Encounter (INDEPENDENT_AMBULATORY_CARE_PROVIDER_SITE_OTHER): Payer: Self-pay | Admitting: Family Medicine

## 2023-11-18 ENCOUNTER — Ambulatory Visit (INDEPENDENT_AMBULATORY_CARE_PROVIDER_SITE_OTHER): Payer: Medicare Other | Admitting: Family Medicine

## 2023-12-27 ENCOUNTER — Ambulatory Visit: Payer: Medicare Other | Attending: General Surgery

## 2023-12-27 VITALS — Wt 197.1 lb

## 2023-12-27 DIAGNOSIS — Z483 Aftercare following surgery for neoplasm: Secondary | ICD-10-CM | POA: Insufficient documentation

## 2023-12-27 NOTE — Therapy (Signed)
 OUTPATIENT PHYSICAL THERAPY SOZO SCREENING NOTE   Patient Name: Aimee Mcclain MRN: [id number] DOB:1956-08-23, 68 y.o., female Today's Date: 12/27/2023  PCP: Cleatus Arlyss RAMAN, MD REFERRING PROVIDER: Aron Shoulders, MD   PT End of Session - 12/27/23 1042     Visit Number 3   # unchanged due to screen only   PT Start Time 1040    PT Stop Time 1044    PT Time Calculation (min) 4 min    Activity Tolerance Patient tolerated treatment well    Behavior During Therapy [facility] for tasks assessed/performed             Past Medical History:  Diagnosis Date   Arthritis    Breast cancer (HCC)    Cataract    Colon polyps    Endometriosis    Family history of colon cancer    Family history of colonic polyps    Fuchs' corneal dystrophy    GERD (gastroesophageal reflux disease)    H/O bilateral breast implants    Heartburn    High blood pressure    History of colon polyps    HPV in female    HSV (herpes simplex virus) infection    Hyperlipidemia    Migraine    no longer having migraines   Osteoarthritis    Osteopenia    Osteopenia    Port-A-Cath in place 03/18/2021   S/P TAH (total abdominal hysterectomy) 1991   Vitamin D  deficiency    Past Surgical History:  Procedure Laterality Date   ABDOMINAL HYSTERECTOMY     BREAST BIOPSY     Benign.   BREAST ENHANCEMENT SURGERY     BREAST LUMPECTOMY Bilateral 1993 and 1995   BREAST LUMPECTOMY WITH RADIOACTIVE SEED AND SENTINEL LYMPH NODE BIOPSY Left 02/13/2021   Procedure: LEFT BREAST LUMPECTOMY WITH RADIOACTIVE SEED AND SENTINEL LYMPH NODE BIOPSY WITH SEED TARGETED NODE;  Surgeon: Aron Shoulders, MD;  Location: [facility];  Service: General;  Laterality: Left;   CATARACT EXTRACTION, BILATERAL     CESAREAN SECTION     PARTIAL HYSTERECTOMY     PLACEMENT OF BREAST IMPLANTS  1993   PORT-A-CATH REMOVAL Right 11/04/2021   Procedure: REMOVAL PORT-A-CATH;  Surgeon: Aron Shoulders, MD;  Location: [facility];  Service: General;   Laterality: Right;   PORTACATH PLACEMENT Right 02/13/2021   Procedure: INSERTION PORT-A-CATH;  Surgeon: Aron Shoulders, MD;  Location: [facility];  Service: General;  Laterality: Right;   TONSILLECTOMY     WISDOM TOOTH EXTRACTION     Patient Active Problem List   Diagnosis Date Noted   Elevated blood pressure reading without diagnosis of hypertension 11/09/2023   Non-alcoholic fatty liver disease 06/24/2022   Endometriosis 06/24/2022   COVID-19 virus infection 05/28/2022   Paronychia of great toe, right 01/16/2022   Pain of right great toe 01/16/2022   Loose toenail 01/16/2022   Leukopenia 12/17/2021   Exposure to COVID-19 virus 12/17/2021   HTN (hypertension) 06/22/2021   Herpes simplex 06/19/2021   Osteopenia 06/19/2021   Medicare welcome exam 05/21/2021   Malignant neoplasm of upper-inner quadrant of left breast in female, 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   Chronic migraine 08/21/2020   Screening mammogram, encounter for 06/30/2020   Paresthesia 06/09/2020   Neuropathy 02/14/2020   Atypical chest pain 02/14/2020   Healthcare maintenance 02/14/2020   Migraine    Advance care planning 01/16/2019   Family history of colon cancer     REFERRING DIAG: left breast cancer at  risk for lymphedema  THERAPY DIAG: Aftercare following surgery for neoplasm  PERTINENT HISTORY: Patient was diagnosed on 01/02/2021 with 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 Ki67 is 30%. Patient underwent a left lumpectomy and sentinel node biopsy (4 negative nodes) on 02/13/2021. Radiation complete 07/28/21   PRECAUTIONS: left UE Lymphedema risk, None  SUBJECTIVE: Pt returns for her 6 month L-Dex screen.   PAIN:  Are you having pain? No  SOZO SCREENING: Patient was assessed today using the SOZO machine to determine the lymphedema index score. This was compared to her baseline score. It was determined that she is within the recommended range when  compared to her baseline and no further action is needed at this time. She will continue SOZO screenings. These are done every 3 months for 2 years post operatively followed by every 6 months for 2 years, and then annually.   L-DEX FLOWSHEETS - 12/27/23 1000       L-DEX LYMPHEDEMA SCREENING   Measurement Type Unilateral    L-DEX MEASUREMENT EXTREMITY Upper Extremity    POSITION  Standing    DOMINANT SIDE Right    At Risk Side Left    BASELINE SCORE (UNILATERAL) 2.6    L-DEX SCORE (UNILATERAL) -3.4    VALUE CHANGE (UNILAT) -6              Aden Berwyn Caldron, PTA 12/27/2023, 10:43 AM

## 2024-01-06 ENCOUNTER — Encounter: Payer: Self-pay | Admitting: Internal Medicine

## 2024-01-19 ENCOUNTER — Encounter: Payer: Self-pay | Admitting: Hematology

## 2024-01-21 ENCOUNTER — Encounter: Payer: Self-pay | Admitting: Hematology

## 2024-01-24 ENCOUNTER — Ambulatory Visit: Payer: Medicare Other | Attending: Internal Medicine | Admitting: Internal Medicine

## 2024-01-24 ENCOUNTER — Ambulatory Visit (INDEPENDENT_AMBULATORY_CARE_PROVIDER_SITE_OTHER): Payer: Medicare Other

## 2024-01-24 VITALS — BP 132/84 | HR 68 | Ht 67.0 in | Wt 198.6 lb

## 2024-01-24 DIAGNOSIS — R002 Palpitations: Secondary | ICD-10-CM | POA: Insufficient documentation

## 2024-01-24 DIAGNOSIS — G43909 Migraine, unspecified, not intractable, without status migrainosus: Secondary | ICD-10-CM | POA: Insufficient documentation

## 2024-01-24 DIAGNOSIS — R0609 Other forms of dyspnea: Secondary | ICD-10-CM | POA: Diagnosis not present

## 2024-01-24 DIAGNOSIS — I10 Essential (primary) hypertension: Secondary | ICD-10-CM | POA: Diagnosis present

## 2024-01-24 NOTE — Progress Notes (Signed)
Cardiology Office Note:    Date:  01/24/2024   ID:  Aimee Mcclain, DOB 03-17-1956, MRN [id number]  PCP:  Joaquim Nam, MD   [facility] Providers Cardiologist:  Aimee Fus, MD     Referring MD: Joaquim Nam, MD   No chief complaint on file. Mild Dyspnea/CAC  History of Present Illness:    Aimee Mcclain is a 68 y.o. female with a hx of breast cancer details below, here with dyspnea  She notes that when she exerts herself in the gym she can feel like she could pass out and dizzy. With daily activity she feels on fine. She can carry groceries and go up stairs without significant DOE or chest pressure. She does have mild dyspnea.  She had some leg pain and she feels better exemestane and this was changed to tamoxifen.   She has no cardiac disease hx. Mother has afib. Her maternal grandfather and uncle had heart disease. Her father had MI at 28.  She smoked for 10 years  Her CT showed mild CAC.  The 10-year ASCVD risk score (Arnett DK, et al., 2019) is: 5.6%   Values used to calculate the score:     Age: 41 years     Sex: Female     Is Non-Hispanic African American: No     Diabetic: No     Tobacco smoker: No     Systolic Blood Pressure: 126 mmHg     Is BP treated: No     HDL Cholesterol: 64 mg/dL     Total Cholesterol: 151 mg/dL  Interim Hx 7/61/6073 She presents today doing well; however has recently had a biopsy of her R breast. The results are pending. Obtained a CAC on her prior visit. It showed CAC 65th percentile. LDL was > 100 mg/dL, started lipitor. Otherwise, she has not worked out in [employer] as much. She was sick with COVID for awhile   Interim hx 01/24/2024 She notes palpitations. She has not felt well. Low energy. She gets SOB. She has been going to the gym. Her chest feels heavy , but improves with exercise.   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 Invasive ductal carcinoma. Upper quadrant of the L breast. 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 Strain -21. TTE 08/06/2021- normal fxn, no strain TTE 11/13/2021- GLS -19 TTE 03/20/2022- GLS - 22  Past Medical History:  Diagnosis Date   Arthritis    Breast cancer (HCC)    Cataract    Colon polyps    Endometriosis    Family history of colon cancer    Family history of colonic polyps    Fuchs' corneal dystrophy    GERD (gastroesophageal reflux disease)    H/O bilateral breast implants    Heartburn    High blood pressure    History of colon polyps    HPV in female    HSV (herpes simplex virus) infection    Hyperlipidemia    Migraine    no longer having migraines   Osteoarthritis    Osteopenia    Osteopenia    Port-A-Cath in place 03/18/2021   S/P TAH (total abdominal hysterectomy) 1991  Vitamin D deficiency     Past Surgical History:  Procedure Laterality Date   ABDOMINAL HYSTERECTOMY     BREAST BIOPSY     Benign.   BREAST ENHANCEMENT SURGERY     BREAST LUMPECTOMY Bilateral 1993 and 1995   BREAST LUMPECTOMY WITH RADIOACTIVE SEED AND SENTINEL LYMPH NODE BIOPSY Left 02/13/2021   Procedure: LEFT BREAST LUMPECTOMY WITH RADIOACTIVE SEED AND SENTINEL LYMPH NODE BIOPSY WITH SEED TARGETED NODE;  Surgeon: Almond Lint, MD;  Location: [facility];  Service: General;  Laterality: Left;   CATARACT EXTRACTION, BILATERAL     CESAREAN SECTION     PARTIAL HYSTERECTOMY     PLACEMENT OF BREAST IMPLANTS  1993   PORT-A-CATH REMOVAL Right 11/04/2021   Procedure: REMOVAL PORT-A-CATH;  Surgeon: Almond Lint, MD;  Location: [facility];  Service: General;  Laterality: Right;   PORTACATH PLACEMENT Right 02/13/2021    Procedure: INSERTION PORT-A-CATH;  Surgeon: Almond Lint, MD;  Location: [facility];  Service: General;  Laterality: Right;   TONSILLECTOMY     WISDOM TOOTH EXTRACTION      Current Medications: Current Outpatient Medications on File Prior to Visit  Medication Sig Dispense Refill   acetaminophen (TYLENOL) 500 MG tablet Take 1,000 mg by mouth every 6 (six) hours as needed for moderate pain (pain score 4-6).     atorvastatin (LIPITOR) 20 MG tablet TAKE 1 TABLET BY MOUTH EVERY DAY 90 tablet 1   Calcium Carbonate-Vitamin D (CALCIUM 600+D PO) Take 1 tablet by mouth daily.     Cholecalciferol (VITAMIN D3) 125 MCG (5000 UT) CAPS Take 1 capsule (5,000 Units total) by mouth daily. 30 capsule 0   omeprazole (PRILOSEC) 20 MG capsule Take 20 mg by mouth daily.     tamoxifen (NOLVADEX) 20 MG tablet Take 1 tablet (20 mg total) by mouth daily. 90 tablet 3   valACYclovir (VALTREX) 500 MG tablet Take 1 tablet (500 mg total) by mouth 2 (two) times daily as needed.     Vitamin B Complex-C CAPS Take 1 capsule by mouth.     Multiple Vitamin (MULTIVITAMIN) tablet Take 1 tablet by mouth daily.     No current facility-administered medications on file prior to visit.     Allergies:   Ibuprofen, Omnicef [cefdinir], Proton pump inhibitors, Roxicodone [oxycodone hcl], and Vioxx [rofecoxib]   Social History   Socioeconomic History   Marital status: Married    Spouse name: Not on file   Number of children: 2   Years of education: some college   Highest education level: Associate degree: occupational, Scientist, product/process development, or vocational program  Occupational History   Occupation: Airline pilot  Tobacco Use   Smoking status: Former    Current packs/day: 0.00    Average packs/day: 0.5 packs/day for 10.0 years (5.0 ttl pk-yrs)    Types: Cigarettes    Start date: 12/15/1983    Quit date: 12/14/1993    Years since quitting: 30.1   Smokeless tobacco: Never  Vaping Use   Vaping status: Never Used  Substance and  Sexual Activity   Alcohol use: Yes    Comment: very rare   Drug use: No   Sexual activity: Yes    Comment: hyst  Other Topics Concern   Not on file  Social History Narrative   Lives at home with husband.   Married 1988.   Right-handed.   Social Drivers of Health   Financial Resource Strain: Low Risk  (11/09/2023)   Overall Financial Resource Strain (CARDIA)  Difficulty of Paying Living Expenses: Not hard at all  Food Insecurity: No Food Insecurity (11/09/2023)   Hunger Vital Sign    Worried About Running Out of Food in the Last Year: Never true    Ran Out of Food in the Last Year: Never true  Transportation Needs: No Transportation Needs (11/09/2023)   PRAPARE - Administrator, Civil Service (Medical): No    Lack of Transportation (Non-Medical): No  Physical Activity: Sufficiently Active (11/09/2023)   Exercise Vital Sign    Days of Exercise per Week: 3 days    Minutes of Exercise per Session: 60 min  Stress: No Stress Concern Present (11/09/2023)   [employer] of Occupational Health - Occupational Stress Questionnaire    Feeling of Stress : Not at all  Social Connections: Socially Integrated (11/09/2023)   Social Connection and Isolation Panel [NHANES]    Frequency of Communication with Friends and Family: More than three times a week    Frequency of Social Gatherings with Friends and Family: Once a week    Attends Religious Services: 1 to 4 times per year    Active Member of [employer] or Organizations: Yes    Attends Engineer, structural: More than 4 times per year    Marital Status: Married     Family History: The patient's family history includes Colon cancer in her father, maternal grandfather, and mother; Colon polyps in her brother and brother; Crohn's disease in her daughter; Dementia in her maternal grandmother; Diabetes in her brother and brother; Heart disease in her maternal grandfather, maternal uncle, and mother; Lymphoma in her  maternal aunt; Rectal cancer (age of onset: 79) in her mother. There is no history of Esophageal cancer, Stomach cancer, or Breast cancer.  ROS:   Please see the history of present illness.     All other systems reviewed and are negative.  EKGs/Labs/Other Studies Reviewed:    The following studies were reviewed today:   EKG:  EKG is  ordered today.  The ekg ordered today demonstrates   07/02/2021- NSR, IRRRB  01/13/2023- NSR  Recent Labs: 10/08/2023: ALT 29; BUN 25; Creatinine 0.91; Hemoglobin 14.1; Platelet Count 170; Potassium 4.3; Sodium 139   Recent Lipid Panel    Component Value Date/Time   CHOL 151 12/17/2022 1225   TRIG 66 12/17/2022 1225   HDL 64 12/17/2022 1225   CHOLHDL 4 02/12/2020 1704   VLDL 17.4 02/12/2020 1704   LDLCALC 74 12/17/2022 1225     Risk Assessment/Calculations:       Physical Exam:    VS:  Vitals:   01/24/24 1558  BP: 132/84  Pulse: 68  SpO2: 99%     Wt Readings from Last 3 Encounters:  12/27/23 197 lb 2 oz (89.4 kg)  11/09/23 190 lb (86.2 kg)  10/11/23 184 lb (83.5 kg)     GEN:  Well nourished, well developed in no acute distress HEENT: Normal NECK: No JVD CARDIAC: RRR, no murmurs, rubs, gallops RESPIRATORY:  Clear to auscultation without rales, wheezing or rhonchi  ABDOMEN: Soft, non-tender, non-distended MUSCULOSKELETAL:  No edema; No deformity  SKIN: Warm and dry NEUROLOGIC:  Alert and oriented x 3 PSYCHIATRIC:  Normal affect   ASSESSMENT:   Palpitations -7 day ziopatch  Mild Dyspnea: Deconditioning? thankfully all of her previous echoes and strain have been normal during her herceptin therapy in 2022. She has no CVD hx. Obtained a coronary CT, no obstructive CAD.  Mild CAC : < 75th percentile.  LDL is now < 100 mg/dL, continue lipitor 20 mg daily  Breast Cancer Survivor: No increased CVD risk with tamoxifen. She's had seed radiation for LUQ breast CA; unknown mean heart dose but likely low.   PLAN:    In order of  problems listed above:   7 day ziopatch Follow up in 6 months with an APP       Medication Adjustments/Labs and Tests Ordered: Current medicines are reviewed at length with the patient today.  Concerns regarding medicines are outlined above.  Orders Placed This Encounter  Procedures   EKG 12-Lead   No orders of the defined types were placed in this encounter.   There are no Patient Instructions on file for this visit.   Signed, Aimee Fus, MD  01/24/2024 4:00 PM    [facility]

## 2024-01-24 NOTE — Progress Notes (Unsigned)
 Enrolled for [employer] to mail a ZIO XT long term holter monitor to the patients address on file.

## 2024-01-24 NOTE — Patient Instructions (Addendum)
 Medication Instructions:  Your physician recommends that you continue on your current medications as directed. Please refer to the Current Medication list given to you today.  *If you need a refill on your cardiac medications before your next appointment, please call your pharmacy*   Testing/Procedures: ZIO XT- Long Term Monitor Instructions  Your physician has requested you wear a ZIO patch monitor for 7 days.  This is a single patch monitor. [employer] supplies one patch monitor per enrollment. Additional stickers are not available. Please do not apply patch if you will be having a Nuclear Stress Test,  Echocardiogram, Cardiac CT, MRI, or Chest Xray during the period you would be wearing the  monitor. The patch cannot be worn during these tests. You cannot remove and re-apply the  ZIO XT patch monitor.  Your ZIO patch monitor will be mailed 3 day USPS to your address on file. It may take 3-5 days  to receive your monitor after you have been enrolled.  Once you have received your monitor, please review the enclosed instructions. Your monitor  has already been registered assigning a specific monitor serial # to you.  Billing and Patient Assistance Program Information  We have supplied [employer] with any of your insurance information on file for billing purposes. [employer] offers a sliding scale Patient Assistance Program for patients that do not have  insurance, or whose insurance does not completely cover the cost of the ZIO monitor.  You must apply for the Patient Assistance Program to qualify for this discounted rate.  To apply, please call [employer] at [phone], select option 4, select option 2, ask to apply for  Patient Assistance Program. Sanna Crystal will ask your household income, and how many people  are in your household. They will quote your out-of-pocket cost based on that information.  [employer] will also be able to set up a 37-month, interest-free payment plan if needed.  Applying the  monitor   Shave hair from upper left chest.  Hold abrader disc by orange tab. Rub abrader in 40 strokes over the upper left chest as  indicated in your monitor instructions.  Clean area with 4 enclosed alcohol pads. Let dry.  Apply patch as indicated in monitor instructions. Patch will be placed under collarbone on left  side of chest with arrow pointing upward.  Rub patch adhesive wings for 2 minutes. Remove white label marked "1". Remove the white  label marked "2". Rub patch adhesive wings for 2 additional minutes.  While looking in a mirror, press and release button in center of patch. A small green light will  flash 3-4 times. This will be your only indicator that the monitor has been turned on.  Do not shower for the first 24 hours. You may shower after the first 24 hours.  Press the button if you feel a symptom. You will hear a small click. Record Date, Time and  Symptom in the Patient Logbook.  When you are ready to remove the patch, follow instructions on the last 2 pages of Patient  Logbook. Stick patch monitor onto the last page of Patient Logbook.  Place Patient Logbook in the blue and white box. Use locking tab on box and tape box closed  securely. The blue and white box has prepaid postage on it. Please place it in the mailbox as  soon as possible. Your physician should have your test results approximately 7 days after the  monitor has been mailed back to [facility].  Call [facility]  at [phone] if you have questions regarding  your ZIO XT patch monitor. Call them immediately if you see an orange light blinking on your  monitor.  If your monitor falls off in less than 4 days, contact our Monitor department at [phone].  If your monitor becomes loose or falls off after 4 days call [employer] at [phone] for  suggestions on securing your monitor    Follow-Up: At [facility], you and your health needs are our priority.  As part of  our continuing mission to provide you with exceptional heart care, we have created designated Provider Care Teams.  These Care Teams include your primary Cardiologist (physician) and Advanced Practice Providers (APPs -  Physician Assistants and Nurse Practitioners) who all work together to provide you with the care you need, when you need it.  Your next appointment:   6 month(s)  Provider:   APP

## 2024-01-28 DIAGNOSIS — R002 Palpitations: Secondary | ICD-10-CM

## 2024-01-29 ENCOUNTER — Other Ambulatory Visit: Payer: Self-pay | Admitting: Internal Medicine

## 2024-02-10 ENCOUNTER — Encounter: Payer: Self-pay | Admitting: Internal Medicine

## 2024-02-25 ENCOUNTER — Ambulatory Visit
Admission: RE | Admit: 2024-02-25 | Discharge: 2024-02-25 | Disposition: A | Discharge status: Home / Self Care | Payer: Medicare Other | Source: Ambulatory Visit | Attending: Obstetrics and Gynecology | Admitting: Obstetrics and Gynecology

## 2024-02-25 DIAGNOSIS — M8580 Other specified disorders of bone density and structure, unspecified site: Secondary | ICD-10-CM

## 2024-04-09 NOTE — Progress Notes (Unsigned)
 [facility]     [phone] Fax:([phone]) [phone]    Patient Care Team: Donnie Galea, MD as PCP - General (Family Medicine) Amanda Jungling Tomas Fountain, MD as PCP - Cardiology (Cardiology) Lockie Rima, MD as Consulting Physician (General Surgery) Sonja Grayson, MD as Consulting Physician (Hematology) Colie Dawes, MD as Attending Physician (Radiation Oncology) Ona Bidding, MD as Consulting Physician (Obstetrics and Gynecology) Dierks Wach K, NP as Nurse Practitioner (Nurse Practitioner)   CHIEF COMPLAINT: Follow-up left breast cancer  Oncology History Overview Note  Cancer Staging Malignant neoplasm of upper-inner quadrant of left breast in female, estrogen receptor positive (HCC) Staging form: Breast, AJCC 8th Edition - Clinical stage from 01/14/2020: Stage IB (cT1c, cN1, cM0, G3, ER+, PR+, HER2+) - Signed by Sonja [place], MD on 01/21/2021 Stage prefix: Initial diagnosis - Pathologic stage from 02/13/2021: Stage IA (pT2, pN0, cM0, G3, ER+, PR+, HER2+) - Signed by Heilingoetter, Cassandra L, PA-C on 03/07/2021 Stage prefix: Initial diagnosis Nuclear grade: G3 Histologic grading system: 3 grade system    Malignant neoplasm of upper-inner quadrant of left breast in female, estrogen receptor positive (HCC)  01/14/2020 Cancer Staging   Staging form: Breast, AJCC 8th Edition - Clinical stage from 01/14/2020: Stage IB (cT1c, cN1, cM0, G3, ER+, PR+, HER2+) - Signed by Sonja [place], MD on 01/21/2021 Stage prefix: Initial diagnosis   01/02/2021 Mammogram   Left breast with 1.9x1x1.8 cm complex cyst at 12:00 position, 4cmfn, versus saloid mass with calcifications, irregulr, increased in size. Biopsy recommended.    01/13/2021 Initial Biopsy   Diagnosis 1. Breast, left, needle core biopsy, 11 o'clock, 4cmfn - INVASIVE DUCTAL CARCINOMA - SEE COMMENT 2. Lymph node, needle/core biopsy, left axilla - FIBROVASCULAR AND ADIPOSE TISSUE - NO LYMPHOID TISSUE OR CARCINOMA  IDENTIFIED Microscopic Comment 1. Based on the biopsy, the carcinoma appears Nottingham grade 3 of 3 and measures 1.2 cm in greatest linear extent. Prognostic markers (ER/PR/ki-67/HER2) are pending and will be reported in an addendum. Dr. Judd Northern reviewed the case and agrees with the above diagnosis. These results were called to [facility] on January 14, 2021   01/13/2021 Receptors her2   1. PROGNOSTIC INDICATORS Results: IMMUNOHISTOCHEMICAL AND MORPHOMETRIC ANALYSIS PERFORMED MANUALLY The tumor cells are POSITIVE for Her2 (3+). Estrogen Receptor: 95%, POSITIVE, STRONG STAINING INTENSITY Progesterone  Receptor: 85%, POSITIVE, STRONG STAINING INTENSITY Proliferation Marker Ki67: 30%   01/16/2021 Initial Diagnosis   Malignant neoplasm of upper-inner quadrant of left breast in female, estrogen receptor positive (HCC)   01/23/2021 Imaging   MRI Breast  IMPRESSION: 1. Known malignancy measuring 2.9 centimeters in the UPPER INNER QUADRANT of the LEFT breast. There is no evidence for involvement of the pectoralis muscle or implant. 2. No evidence for lymphadenopathy. 3. RIGHT breast is negative. 4. Bilateral retropectoral saline implants.   02/09/2021 Imaging   MRI Brain IMPRESSION: No evidence of intracranial metastatic disease.   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     02/13/2021 Surgery   LEFT BREAST LUMPECTOMY WITH RADIOACTIVE SEED AND SENTINEL LYMPH NODE BIOPSY WITH SEED TARGETED NODE and PAC Placed by Dr Cherlynn Cornfield   02/13/2021 Pathology Results   FINAL MICROSCOPIC DIAGNOSIS:   A. BREAST, LEFT, LUMPECTOMY:  - Invasive ductal carcinoma, 2.6 cm, Nottingham grade 3 of 3.  - Ductal carcinoma in situ, high grade with central necrosis.  - Margins of resection:       - Invasive carcinoma broadly involves the anterior and inferior  margins.  Invasive carcinoma is  focally < 1 mm from each the superior  and medial margins.       - DCIS is focally < 1 mm from the inferior margin.   DCIS is focally  1 mm from each the anterior and superior margins.  DCIS is focally 1-2  mm from the medial margin.  - Biopsy clip.  - See oncology table.   B. BREAST, LEFT, ANTERIOR TISSUE, EXCISION:  - Residual invasive and in-situ ductal carcinoma.  - Residual invasive ductal carcinoma broadly involves the posterior  margin.  - Residual DCIS is focally 1 mm from the posterior margin.   C. BREAST, LEFT, INFERIOR MARGIN, EXCISION:  - No carcinoma identified.   D. SENTINEL LYMPH NODE, LEFT AXILLARY #1, BIOPSY:  - No carcinoma identified in one lymph node (0/1).   E. SENTINEL LYMPH NODE, LEFT AXILLARY #2, BIOPSY:  - Biopsy site.  - No distinct nodal tissue identified.  - No carcinoma identified.   F. SENTINEL LYMPH NODE, LEFT AXILLARY #3, BIOPSY:  - No carcinoma identified in one lymph node (0/1).   G. SENTINEL LYMPH NODE, LEFT AXILLARY #4, BIOPSY:  - No carcinoma identified in one lymph node (0/1).    02/13/2021 Cancer Staging   Staging form: Breast, AJCC 8th Edition - Pathologic stage from 02/13/2021: Stage IA (pT2, pN0, cM0, G3, ER+, PR+, HER2+) - Signed by Heilingoetter, Cassandra L, PA-C on 03/07/2021 Stage prefix: Initial diagnosis Nuclear grade: G3 Histologic grading system: 3 grade system   03/18/2021 - 02/19/2022 Chemotherapy   Patient is on Treatment Plan : BREAST Paclitaxel  + Trastuzumab  q7d / Trastuzumab  q21d     06/2021 - 07/2021 Radiation Therapy   Adjuvant radiation per Dr. Lurena Sally   08/20/2021 Imaging   CT Chest w/o contrast  IMPRESSION: No definite evidence of pulmonary metastatic disease.   5 mm indeterminate right lower lobe pulmonary nodule. Comparison with prior examinations, if available, is recommended to determine chronicity. No follow-up needed if patient is low-risk. In absence of priors, Non-contrast chest CT can be considered in 12 months if patient is high-risk. This recommendation follows the consensus statement: Guidelines for Management of Incidental  Pulmonary Nodules Detected on CT Images: From the Fleischner Society 2017; Radiology 2017; 284:228-243.   Mild coronary artery calcification.   08/20/2021 Imaging   CT Head w/o contrast  IMPRESSION: No acute abnormality. No evidence of intracranial metastatic disease.   08/2021 -  Anti-estrogen oral therapy   Began adjuvant exemestane ; changed to tamoxifen  02/2022   11/13/2021 Survivorship   SCP delivered by Elisabetta Mishra, NP      CURRENT THERAPY: Anti-estrogen starting 08/2021, currently on Tamoxifen  since 02/2022  INTERVAL HISTORY Aimee Mcclain returns for follow-up as scheduled, last seen by Alwin Baars, NP 10/08/23.  Mammogram 01/17/2024 was benign in the left, indeterminate in the right with asymmetry. Recall right mammo/ultrasound 01/20/2024 was benign. DEXA 02/25/24 showed mild osteopenia, low FRAX score.  On 4/13 she was doing self breast exam and palpated 2 new lumps in the right breast near areola, she would like to know what this is.  Denies skin change or nipple discharge.  She continues intentional weight loss from "[facility] diet" and exercise, occasionally feels weak or like she may pass out but does not.  Has found that eating workouts helps. Wore a cardiac monitor which was unremarkable per patient.  She was able to complete the workout and continues ADLs without difficulty.  Has some joint aches in the shins but nothing like bone pain with AI.  Hot flashes have improved over time, side effects are tolerable.  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    ROS  All other systems reviewed and negative  Past Medical History:  Diagnosis Date   Arthritis    Breast cancer (HCC)    Cataract    Colon polyps    Endometriosis    Family history of colon cancer    Family history of colonic polyps    Fuchs' corneal dystrophy    GERD (gastroesophageal reflux disease)    H/O bilateral breast implants    Heartburn    High blood pressure     History of colon polyps    HPV in female    HSV (herpes simplex virus) infection    Hyperlipidemia    Migraine    no longer having migraines   Osteoarthritis    Osteopenia    Osteopenia    Port-A-Cath in place 03/18/2021   S/P TAH (total abdominal hysterectomy) 1991   Vitamin D  deficiency      Past Surgical History:  Procedure Laterality Date   ABDOMINAL HYSTERECTOMY     BREAST BIOPSY     Benign.   BREAST ENHANCEMENT SURGERY     BREAST LUMPECTOMY Bilateral 1993 and 1995   BREAST LUMPECTOMY WITH RADIOACTIVE SEED AND SENTINEL LYMPH NODE BIOPSY Left 02/13/2021   Procedure: LEFT BREAST LUMPECTOMY WITH RADIOACTIVE SEED AND SENTINEL LYMPH NODE BIOPSY WITH SEED TARGETED NODE;  Surgeon: Lockie Rima, MD;  Location: [facility];  Service: General;  Laterality: Left;   CATARACT EXTRACTION, BILATERAL     CESAREAN SECTION     PARTIAL HYSTERECTOMY     PLACEMENT OF BREAST IMPLANTS  1993   PORT-A-CATH REMOVAL Right 11/04/2021   Procedure: REMOVAL PORT-A-CATH;  Surgeon: Lockie Rima, MD;  Location: [facility];  Service: General;  Laterality: Right;   PORTACATH PLACEMENT Right 02/13/2021   Procedure: INSERTION PORT-A-CATH;  Surgeon: Lockie Rima, MD;  Location: [facility];  Service: General;  Laterality: Right;   TONSILLECTOMY     WISDOM TOOTH EXTRACTION       Outpatient Encounter Medications as of 04/10/2024  Medication Sig   acetaminophen  (TYLENOL ) 500 MG tablet Take 1,000 mg by mouth every 6 (six) hours as needed for moderate pain (pain score 4-6).   atorvastatin  (LIPITOR) 20 MG tablet TAKE 1 TABLET BY MOUTH EVERY DAY   Calcium  Carbonate-Vitamin D  (CALCIUM  600+D PO) Take 1 tablet by mouth daily.   Cholecalciferol (VITAMIN D3) 125 MCG (5000 UT) CAPS Take 1 capsule (5,000 Units total) by mouth daily.   Multiple Vitamin (MULTIVITAMIN) tablet Take 1 tablet by mouth daily.   omeprazole  (PRILOSEC) 20 MG capsule Take 20 mg by mouth daily.   tamoxifen  (NOLVADEX ) 20 MG  tablet Take 1 tablet (20 mg total) by mouth daily.   UNABLE TO FIND Take by mouth daily. Med Name: WEEM gummies   two PO daily   valACYclovir  (VALTREX ) 500 MG tablet Take 1 tablet (500 mg total) by mouth 2 (two) times daily as needed.   Vitamin B Complex-C CAPS Take 1 capsule by mouth.   No facility-administered encounter medications on file as of 04/10/2024.     Today's Vitals   04/10/24 1116 04/10/24 1123  BP: (!) 142/81   Pulse: 92   Resp: 18   Temp: 97.8 F (36.6 C)   TempSrc: Temporal   SpO2: 100%   Weight: 174 lb 1 oz (79 kg)   PainSc:  0-No pain   Body mass index is 27.26 kg/m.   ECOG PERFORMANCE STATUS: 0 - Asymptomatic  PHYSICAL EXAM GENERAL:alert, no distress and comfortable SKIN: no rash  EYES: sclera clear NECK: without mass LYMPH:  no palpable cervical or supraclavicular lymphadenopathy  LUNGS: normal breathing effort HEART:  no lower extremity edema ABDOMEN: abdomen soft, non-tender and normal bowel sounds NEURO: alert & oriented x 3 with fluent speech, no focal motor/sensory deficits Breast exam: S/p left lumpectomy and bilateral implant reconstruction, incisions completely healed.  2 small densities noted at the right breast areola 3-4:00 and just medially.  No discrete mass in either breast or axilla that I could appreciate   CBC    Latest Ref Rng & Units 04/10/2024   10:34 AM 10/08/2023   10:39 AM 04/08/2023   10:41 AM  CBC  WBC 4.0 - 10.5 K/uL 5.5  4.8  4.7   Hemoglobin 12.0 - 15.0 g/dL 16.1  09.6  04.5   Hematocrit 36.0 - 46.0 % 40.1  41.2  40.3   Platelets 150 - 400 K/uL 152  170  184       CMP     Latest Ref Rng & Units 04/10/2024   10:34 AM 10/08/2023   10:39 AM 04/08/2023   10:41 AM  CMP  Glucose 70 - 99 mg/dL 98  97  90   BUN 8 - 23 mg/dL 21  25  21    Creatinine 0.44 - 1.00 mg/dL 4.09  8.11  9.14   Sodium 135 - 145 mmol/L 141  139  142   Potassium 3.5 - 5.1 mmol/L 4.5  4.3  4.4   Chloride 98 - 111 mmol/L 109  108  108   CO2 22 - 32  mmol/L 27  25  30    Calcium  8.9 - 10.3 mg/dL 9.6  9.5  9.8   Total Protein 6.5 - 8.1 g/dL 7.0  7.1  6.9   Total Bilirubin 0.0 - 1.2 mg/dL 0.6  0.5  0.5   Alkaline Phos 38 - 126 U/L 53  54  63   AST 15 - 41 U/L 31  25  23    ALT 0 - 44 U/L 37  29  22       ASSESSMENT & PLAN: Aimee Mcclain is a 68 y.o. female with    Malignant neoplasm of upper-inner quadrant of left breast in female, estrogen receptor positive; cT1cN1M0 stage Ib, triple positive, pT2N0 stage IA, grade 3 -Diagnosed 12/2020, s/p left lumpectomy and SLNB 02/13/21 by Dr. Cherlynn Cornfield -Staging work-up showed small indeterminate lung nodules and an indeterminate left parietal bone lesion which will monitor with future scans, otherwise negative for distant metastasis -She completed 10 of 12 doses of adjuvant Taxol /Abraxane  and herceptin  on 05/21/21, last 2 doses canceled due to her worsening neuropathy. -s/p radiation 06/30/21 - 07/25/21 under Dr. Lurena Sally. -she took exemestane  08/2021 - 01/2022, discontinued due to constipation and increased joint/bone pain. She switched to tamoxifen  in 03/2022 and is tolerating better. -Due to her back pain, a bone scan was obtained in February 2024, which was negative for definitive bone metastasis.  There is mild uptake in the right periorbital region, unclear etiology, head CT revealed a stable benign appearing left parietal bone lesion, no f/up recommended  - Mr. Laverdure is clinically doing well, tolerating Tamoxifen .  Exam shows 2 small densities in the right breast, likely fibroglandular tissue, but due to her concern and request she would like to proceed with workup. My  suspicion for malignancy is very low.  Left breast is benign, otherwise no clinical concern for recurrence - Labs reviewed -Continue breast cancer surveillance and tamoxifen .  We discussed obtaining breast cancer index after 5 years to determine her benefit of prolonged antiestrogen therapy for 7-10 years; she is open to this - If workup is  negative, follow-up in 6 months   Osteopenia -DEXA 06/24/21 showed osteopenia, with T-score of -1.3, slightly worsened to lowest T score -1.8 on 02/25/24, low FRAX score -Taking calcium  and vit D and weight bearing exercise.       PLAN: -Recent Mammo, DEXA, and today's labs reviewed -R breast Mammo/US  to evaluate densities, I will call with results  -If work up is negative, continue breast cancer surveillance and Tamoxifen  -F/up in 6 months, or sooner if needed  Orders Placed This Encounter  Procedures   US  LIMITED ULTRASOUND INCLUDING AXILLA RIGHT BREAST    Standing Status:   Future    Expected Date:   04/17/2024    Expiration Date:   04/10/2025    Scheduling Instructions:     [facility]    Reason for Exam (SYMPTOM  OR DIAGNOSIS REQUIRED):   h/o left breast cancer, new 2 breast densities @ areola 3-4 oclock and medially    Preferred Imaging Location?:   External   MM DIAG BREAST W/IMPLANT UNI RIGHT    Standing Status:   Future    Expected Date:   04/17/2024    Expiration Date:   04/10/2025    Scheduling Instructions:     [facility]    Reason for Exam (SYMPTOM  OR DIAGNOSIS REQUIRED):   h/o left breast cancer, new 2 breast densities @ areola 3-4 oclock and medially    Preferred imaging location?:   External      All questions were answered. The patient knows to call the clinic with any problems, questions or concerns. No barriers to learning were detected. I spent 20 minutes counseling the patient face to face. The total time spent in the appointment was 30 minutes and more than 50% was on counseling, review of test results, and coordination of care.   Aimee Donaghy K Lakaisha Danish, NP 04/10/2024

## 2024-04-10 ENCOUNTER — Inpatient Hospital Stay (HOSPITAL_BASED_OUTPATIENT_CLINIC_OR_DEPARTMENT_OTHER): Payer: Medicare Other | Admitting: Nurse Practitioner

## 2024-04-10 ENCOUNTER — Other Ambulatory Visit: Payer: Self-pay | Admitting: Nurse Practitioner

## 2024-04-10 ENCOUNTER — Encounter: Payer: Self-pay | Admitting: Nurse Practitioner

## 2024-04-10 ENCOUNTER — Other Ambulatory Visit: Payer: Self-pay

## 2024-04-10 ENCOUNTER — Inpatient Hospital Stay: Payer: Medicare Other | Attending: Nurse Practitioner

## 2024-04-10 ENCOUNTER — Telehealth: Payer: Self-pay

## 2024-04-10 VITALS — BP 142/81 | HR 92 | Temp 97.8°F | Resp 18 | Wt 174.1 lb

## 2024-04-10 DIAGNOSIS — Z79810 Long term (current) use of selective estrogen receptor modulators (SERMs): Secondary | ICD-10-CM | POA: Insufficient documentation

## 2024-04-10 DIAGNOSIS — Z170 Estrogen receptor positive status [ER+]: Secondary | ICD-10-CM

## 2024-04-10 DIAGNOSIS — Z1721 Progesterone receptor positive status: Secondary | ICD-10-CM | POA: Insufficient documentation

## 2024-04-10 DIAGNOSIS — Z9221 Personal history of antineoplastic chemotherapy: Secondary | ICD-10-CM | POA: Diagnosis not present

## 2024-04-10 DIAGNOSIS — Z923 Personal history of irradiation: Secondary | ICD-10-CM | POA: Insufficient documentation

## 2024-04-10 DIAGNOSIS — Z9889 Other specified postprocedural states: Secondary | ICD-10-CM | POA: Diagnosis not present

## 2024-04-10 DIAGNOSIS — C50212 Malignant neoplasm of upper-inner quadrant of left female breast: Secondary | ICD-10-CM

## 2024-04-10 DIAGNOSIS — M8580 Other specified disorders of bone density and structure, unspecified site: Secondary | ICD-10-CM | POA: Insufficient documentation

## 2024-04-10 DIAGNOSIS — Z1731 Human epidermal growth factor receptor 2 positive status: Secondary | ICD-10-CM | POA: Diagnosis not present

## 2024-04-10 LAB — CMP (CANCER CENTER ONLY)
ALT: 37 U/L (ref 0–44)
AST: 31 U/L (ref 15–41)
Albumin: 4.8 g/dL (ref 3.5–5.0)
Alkaline Phosphatase: 53 U/L (ref 38–126)
Anion gap: 5 (ref 5–15)
BUN: 21 mg/dL (ref 8–23)
CO2: 27 mmol/L (ref 22–32)
Calcium: 9.6 mg/dL (ref 8.9–10.3)
Chloride: 109 mmol/L (ref 98–111)
Creatinine: 0.85 mg/dL (ref 0.44–1.00)
GFR, Estimated: >60 mL/min (ref >60)
Glucose, Bld: 98 mg/dL (ref 70–99)
Potassium: 4.5 mmol/L (ref 3.5–5.1)
Sodium: 141 mmol/L (ref 135–145)
Total Bilirubin: 0.6 mg/dL (ref 0.0–1.2)
Total Protein: 7 g/dL (ref 6.5–8.1)

## 2024-04-10 LAB — CBC WITH DIFFERENTIAL (CANCER CENTER ONLY)
Abs Immature Granulocytes: 0.01 10*3/uL (ref 0.00–0.07)
Basophils Absolute: 0 10*3/uL (ref 0.0–0.1)
Basophils Relative: 1 %
Eosinophils Absolute: 0 10*3/uL (ref 0.0–0.5)
Eosinophils Relative: 1 %
HCT: 40.1 % (ref 36.0–46.0)
Hemoglobin: 13.8 g/dL (ref 12.0–15.0)
Immature Granulocytes: 0 %
Lymphocytes Relative: 29 %
Lymphs Abs: 1.6 10*3/uL (ref 0.7–4.0)
MCH: 30.4 pg (ref 26.0–34.0)
MCHC: 34.4 g/dL (ref 30.0–36.0)
MCV: 88.3 fL (ref 80.0–100.0)
Monocytes Absolute: 0.5 10*3/uL (ref 0.1–1.0)
Monocytes Relative: 9 %
Neutro Abs: 3.3 10*3/uL (ref 1.7–7.7)
Neutrophils Relative %: 60 %
Platelet Count: 152 10*3/uL (ref 150–400)
RBC: 4.54 MIL/uL (ref 3.87–5.11)
RDW: 13.2 % (ref 11.5–15.5)
WBC Count: 5.5 10*3/uL (ref 4.0–10.5)
nRBC: 0 % (ref 0.0–0.2)

## 2024-04-10 MED ORDER — TAMOXIFEN CITRATE 20 MG PO TABS: 20.0000 mg | ORAL_TABLET | Freq: Every day | ORAL | 3 refills | Status: AC

## 2024-04-10 NOTE — Telephone Encounter (Signed)
 Order for mammogram and US  faxed to [facility].  Confirmation received.

## 2024-04-20 ENCOUNTER — Encounter: Payer: Self-pay | Admitting: Nurse Practitioner

## 2024-04-21 ENCOUNTER — Telehealth: Payer: Self-pay

## 2024-04-21 ENCOUNTER — Other Ambulatory Visit: Payer: Self-pay

## 2024-04-21 NOTE — Telephone Encounter (Signed)
 Reached out to [facility], per [name] Burton, NP to confirm mammogram and US  orders were received and patient was scheduled. Was advised that the orders has not been received. Faxed orders/demo/insurance/visit notes to [phone] requesting STAT appointment. Confirmation received.

## 2024-04-25 ENCOUNTER — Telehealth: Payer: Self-pay

## 2024-04-25 ENCOUNTER — Other Ambulatory Visit: Payer: Self-pay

## 2024-04-25 NOTE — Telephone Encounter (Signed)
 Spoke with [facility]. Patient is scheduled for a guided biopsy, May 05, 2024. [facility] confirmed that they received the order for the guided biopsy today.

## 2024-04-26 ENCOUNTER — Encounter: Payer: Self-pay | Admitting: Obstetrics and Gynecology

## 2024-04-26 ENCOUNTER — Ambulatory Visit: Payer: Medicare Other | Admitting: Obstetrics and Gynecology

## 2024-04-26 VITALS — BP 114/74 | HR 62 | Ht 66.8 in | Wt 172.0 lb

## 2024-04-26 DIAGNOSIS — N393 Stress incontinence (female) (male): Secondary | ICD-10-CM | POA: Insufficient documentation

## 2024-04-26 DIAGNOSIS — K5904 Chronic idiopathic constipation: Secondary | ICD-10-CM | POA: Diagnosis not present

## 2024-04-26 DIAGNOSIS — R350 Frequency of micturition: Secondary | ICD-10-CM | POA: Diagnosis not present

## 2024-04-26 LAB — POCT URINALYSIS DIPSTICK
Bilirubin, UA: NEGATIVE
Blood, UA: NEGATIVE
Glucose, UA: NEGATIVE
Ketones, UA: NEGATIVE
Leukocytes, UA: NEGATIVE
Nitrite, UA: NEGATIVE
Protein, UA: NEGATIVE
Spec Grav, UA: <=1.005 — AB (ref 1.010–1.025)
Urobilinogen, UA: 0.2 U/dL
pH, UA: 5.5 (ref 5.0–8.0)

## 2024-04-26 NOTE — Assessment & Plan Note (Signed)
-   For treatment of stress urinary incontinence,  non-surgical options include expectant management, weight loss, physical therapy, as well as a pessary.  Surgical options include a midurethral sling, and transurethral injection of a bulking agent. - She has an upcoming breast biopsy and the results may impact what she chooses for treatment. She is considering surgical intervention so was provided with handouts about the sling and urethral bulking.

## 2024-04-26 NOTE — Assessment & Plan Note (Signed)
-   We discussed the symptoms of overactive bladder (OAB), which include urinary urgency, urinary frequency, nocturia, with or without urge incontinence.  While we do not know the exact etiology of OAB, several treatment options exist. We discussed management including behavioral therapy (decreasing bladder irritants, urge suppression strategies, timed voids, bladder retraining), physical therapy, medication.  - Recommended decreasing intake of bladder irritants including coffee and soda

## 2024-04-26 NOTE — Progress Notes (Signed)
 New Patient Evaluation and Consultation  Referring Provider: Ona Bidding, MD PCP: Donnie Galea, MD Date of Service: 04/26/2024  SUBJECTIVE Chief Complaint: New Patient (Initial Visit) Aimee Mcclain is a 67 y.o. female is here for urinary incontinence.)  History of Present Illness: Aimee Mcclain is a 68 y.o. White or Caucasian female seen in consultation at the request of Dr Duke Gibbons for evaluation of incontinence.     Urinary Symptoms: Leaks urine with cough/ sneeze, laughing, exercise, lifting, with a full bladder, with movement to the bathroom, and without sensation. Sometimes urine will just leak out if bladder is full when she lifting. Usually with movement or activity.  Has been having symptoms about a year or more. She has recently lost 25 lbs with diet and has noticed some improvement in her leakage.  Leaks 0-5 time(s) per day.  Pad use: 1 pads per day.   Patient is bothered by UI symptoms.   Day time voids 7-10.  Nocturia: 0 times per night to void. Voiding dysfunction:  does not empty bladder well.  Patient does not use a catheter to empty bladder.  When urinating, patient feels a weak stream, dribbling after finishing, and to push on her belly or vagina to empty bladder Drinks: 4 cups coffee in AM, 2-3 bottles water, 2-3 bottles diet soda per day  UTIs: 0 UTI's in the last year.   Denies history of blood in urine and kidney or bladder stones   Pelvic Organ Prolapse Symptoms:                  Patient Denies a feeling of a bulge the vaginal area.   Bowel Symptom: Bowel movements: 2-3 time(s) per week, sometimes daily, sometimes will go a few weeks without BM Stool consistency: soft  Straining: yes.  Splinting: no.  Incomplete evacuation: no.  Patient Denies accidental bowel leakage / fecal incontinence Bowel regimen: stool softener and miralax- as needed  HM Colonoscopy          Upcoming     Colonoscopy (Every 5 Years) Next due on 09/19/2028     09/20/2023  COLONOSCOPY   Only the first 1 history entries have been loaded, but more history exists.                Sexual Function Sexually active: yes.  Sexual orientation: Straight Pain with sex: Yes, at the vaginal opening, deep in the pelvis, has discomfort due to dryness  Pelvic Pain Denies pelvic pain  Past Medical History:  Past Medical History:  Diagnosis Date   Arthritis    Breast cancer (HCC)    Cataract    Colon polyps    Endometriosis    Family history of colon cancer    Family history of colonic polyps    Fuchs' corneal dystrophy    GERD (gastroesophageal reflux disease)    H/O bilateral breast implants    Heartburn    High blood pressure    History of colon polyps    HPV in female    HSV (herpes simplex virus) infection    Hyperlipidemia    Migraine    no longer having migraines   Osteoarthritis    Osteopenia    Osteopenia    Port-A-Cath in place 03/18/2021   S/P TAH (total abdominal hysterectomy) 1991   Vitamin D  deficiency      Past Surgical History:   Past Surgical History:  Procedure Laterality Date   ABDOMINAL HYSTERECTOMY     BREAST  BIOPSY     Benign.   BREAST ENHANCEMENT SURGERY     BREAST LUMPECTOMY Bilateral 1993 and 1995   BREAST LUMPECTOMY WITH RADIOACTIVE SEED AND SENTINEL LYMPH NODE BIOPSY Left 02/13/2021   Procedure: LEFT BREAST LUMPECTOMY WITH RADIOACTIVE SEED AND SENTINEL LYMPH NODE BIOPSY WITH SEED TARGETED NODE;  Surgeon: Lockie Rima, MD;  Location: [facility];  Service: General;  Laterality: Left;   CATARACT EXTRACTION, BILATERAL     CESAREAN SECTION     PARTIAL HYSTERECTOMY     PLACEMENT OF BREAST IMPLANTS  1993   PORT-A-CATH REMOVAL Right 11/04/2021   Procedure: REMOVAL PORT-A-CATH;  Surgeon: Lockie Rima, MD;  Location: [facility];  Service: General;  Laterality: Right;   PORTACATH PLACEMENT Right 02/13/2021   Procedure: INSERTION PORT-A-CATH;  Surgeon: Lockie Rima, MD;  Location: [facility];  Service: General;  Laterality: Right;   TONSILLECTOMY     WISDOM TOOTH EXTRACTION       Past OB/GYN History: OB History  Gravida Para Term Preterm AB Living  2 2 2   2   SAB IAB Ectopic Multiple Live Births          # Outcome Date GA Lbr Len/2nd Weight Sex Type Anes PTL Lv  2 Term      CS-Unspec     1 Term      Vag-Spont       S/p hysterectomy  Medications: Patient has a current medication list which includes the following prescription(s): acetaminophen , atorvastatin , calcium  carbonate-vitamin d , vitamin d3, multivitamin, omeprazole , tamoxifen , UNABLE TO FIND, valacyclovir , and vitamin b complex-c.   Allergies: Patient is allergic to ibuprofen, omnicef [cefdinir], proton pump inhibitors, roxicodone  [oxycodone  hcl], and vioxx [rofecoxib].   Social History:  Social History   Tobacco Use   Smoking status: Former    Current packs/day: 0.00    Average packs/day: 0.5 packs/day for 10.0 years (5.0 ttl pk-yrs)    Types: Cigarettes    Start date: 12/15/1983    Quit date: 12/14/1993    Years since quitting: 30.3   Smokeless tobacco: Never  Vaping Use   Vaping status: Never Used  Substance Use Topics   Alcohol use: Yes    Comment: very rare   Drug use: No    Relationship status: married Patient lives with her husband.   Patient is employed as a CPA. Regular exercise: Yes: gym 3x per week History of abuse: No  Family History:   Family History  Problem Relation Age of Onset   Dementia Maternal Grandmother    Heart disease Maternal Grandfather    Colon cancer Maternal Grandfather        dx over 64   Colon cancer Father        d. 50   Heart disease Mother    Colon cancer Mother        dx late 25s-70s   Rectal cancer Mother 72   Diabetes Brother    Colon polyps Brother    Diabetes Brother    Colon polyps Brother    Lymphoma Maternal Aunt    Heart disease Maternal Uncle    Crohn's disease Daughter    Esophageal cancer Neg Hx    Stomach cancer Neg Hx     Breast cancer Neg Hx      Review of Systems: Review of Systems  Constitutional:  Positive for malaise/fatigue. Negative for fever and weight loss.  Respiratory:  Positive for shortness of breath. Negative for cough and wheezing.  Cardiovascular:  Negative for chest pain, palpitations and leg swelling.  Gastrointestinal:  Negative for abdominal pain and blood in stool.  Genitourinary:  Negative for dysuria.  Musculoskeletal:  Negative for myalgias.  Skin:  Negative for rash.  Neurological:  Negative for dizziness and headaches.  Endo/Heme/Allergies:  Does not bruise/bleed easily.       + hot flashes  Psychiatric/Behavioral:  Negative for depression. The patient is not nervous/anxious.      OBJECTIVE Physical Exam: Vitals:   04/26/24 1025  BP: 114/74  Pulse: 62  Weight: 172 lb (78 kg)  Height: 5' 6.8" (1.697 m)    Physical Exam Vitals reviewed. Exam conducted with a chaperone present.  Constitutional:      General: She is not in acute distress. Pulmonary:     Effort: Pulmonary effort is normal.  Abdominal:     General: There is no distension.     Palpations: Abdomen is soft.     Tenderness: There is no abdominal tenderness. There is no rebound.  Musculoskeletal:        General: No swelling. Normal range of motion.  Skin:    General: Skin is warm and dry.     Findings: No rash.  Neurological:     Mental Status: She is alert and oriented to person, place, and time.  Psychiatric:        Mood and Affect: Mood normal.        Behavior: Behavior normal.      GU / Detailed Urogynecologic Evaluation:  Pelvic Exam: Normal external female genitalia; Bartholin's and Skene's glands normal in appearance; urethral meatus normal in appearance, no urethral masses or discharge.   CST: positive  s/p hysterectomy: Speculum exam reveals normal vaginal mucosa with  atrophy and normal vaginal cuff.  Adnexa no mass, fullness, tenderness.     Pelvic floor strength II/V  Pelvic  floor musculature: Right levator non-tender, Right obturator non-tender, Left levator non-tender, Left obturator non-tender  POP-Q:   POP-Q  -3                                            Aa   -3                                           Ba  -6                                              C   2.5                                            Gh  3.5                                            Pb  7.5  tvl   -2                                            Ap  -2                                            Bp                                                 D      Rectal Exam:  Large hemorrhoids present  Post-Void Residual (PVR) by Bladder Scan: In order to evaluate bladder emptying, we discussed obtaining a postvoid residual and patient agreed to this procedure.  Procedure: The ultrasound unit was placed on the patient's abdomen in the suprapubic region after the patient had voided.    Post Void Residual - 04/26/24 1119       Post Void Residual   Post Void Residual 27 mL              Laboratory Results: No results found for: "COLORU", "CLARITYU", "GLUCOSEUR", "BILIRUBINUR", "KETONESU", "SPECGRAV", "RBCUR", "PHUR", "PROTEINUR", "UROBILINOGEN", "LEUKOCYTESUR"  Lab Results  Component Value Date   CREATININE 0.85 04/10/2024   CREATININE 0.91 10/08/2023   CREATININE 0.78 04/08/2023    Lab Results  Component Value Date   HGBA1C 5.4 12/17/2022    Lab Results  Component Value Date   HGB 13.8 04/10/2024     ASSESSMENT AND PLAN Aimee Mcclain is a 68 y.o. with:  1. SUI (stress urinary incontinence, female)   2. Urinary frequency   3. Chronic idiopathic constipation     SUI (stress urinary incontinence, female) Assessment & Plan: - For treatment of stress urinary incontinence,  non-surgical options include expectant management, weight loss, physical therapy, as well as a pessary.  Surgical options include a  midurethral sling, and transurethral injection of a bulking agent. - She has an upcoming breast biopsy and the results may impact what she chooses for treatment. She is considering surgical intervention so was provided with handouts about the sling and urethral bulking.    Urinary frequency Assessment & Plan: - We discussed the symptoms of overactive bladder (OAB), which include urinary urgency, urinary frequency, nocturia, with or without urge incontinence.  While we do not know the exact etiology of OAB, several treatment options exist. We discussed management including behavioral therapy (decreasing bladder irritants, urge suppression strategies, timed voids, bladder retraining), physical therapy, medication.  - Recommended decreasing intake of bladder irritants including coffee and soda    Chronic idiopathic constipation Assessment & Plan: - For constipation, we reviewed the importance of a better bowel regimen.  We also discussed the importance of avoiding chronic straining, as it can exacerbate her pelvic floor symptoms. We discussed initiating therapy with increasing fluid intake, fiber supplementation, stool softeners, and laxatives such as miralax.  - Recommended daily miralax   She will message us  when she decides on further treatment.    Arma Lamp, MD

## 2024-04-26 NOTE — Assessment & Plan Note (Signed)
-   For constipation, we reviewed the importance of a better bowel regimen.  We also discussed the importance of avoiding chronic straining, as it can exacerbate her pelvic floor symptoms. We discussed initiating therapy with increasing fluid intake, fiber supplementation, stool softeners, and laxatives such as miralax.  - Recommended daily miralax

## 2024-04-26 NOTE — Patient Instructions (Addendum)
Today we talked about ways to manage bladder urgency such as altering your diet to avoid irritative beverages and foods (bladder diet) as well as attempting to decrease stress and other exacerbating factors.    The Most Bothersome Foods* The Least Bothersome Foods*  Coffee - Regular & Decaf Tea - caffeinated Carbonated beverages - cola, non-colas, diet & caffeine-free Alcohols - Beer, Red Wine, White Wine, Champagne Fruits - Grapefruit, [place], Orange, [employer] - Cranberry, Grapefruit, Orange, Pineapple Vegetables - Tomato & Tomato Products Flavor Enhancers - Hot peppers, Spicy foods, Chili, Horseradish, Vinegar, Monosodium glutamate (MSG) Artificial Sweeteners - NutraSweet, Sweet 'N Low, Equal (sweetener), Saccharin Ethnic foods - [place], [place], [place] food [employer] - low-fat & whole Fruits - Bananas, Blueberries, Honeydew melon, Pears, Raisins, Watermelon Vegetables - Broccoli, Brussels Sprouts, [place], Carrots, Cauliflower, [place], Cucumber, Mushrooms, Peas, Radishes, Squash, Zucchini, White potatoes, Sweet potatoes & yams Poultry - Chicken, Eggs, [place], [employer] - Beef, Programmer, multimedia, Lamb Seafood - Shrimp, [place] fish, Salmon Grains - Oat, Rice Snacks - Pretzels, Popcorn  *Lissa Morales et al. Diet and its role in interstitial cystitis/bladder pain syndrome (IC/BPS) and comorbid conditions. [place] 2012 Jan 11.   Constipation: Our goal is to achieve formed bowel movements daily or every-other-day.  You may need to try different combinations of the following options to find what works best for you - everybody's body works differently so feel free to adjust the dosages as needed.  Some options to help maintain bowel health include:  Dietary changes (more leafy greens, vegetables and fruits; less processed foods) Fiber supplementation (Benefiber, FiberCon, Metamucil or Psyllium). Start slow and increase gradually to full dose. Over-the-counter agents such as: stool  softeners (Docusate or Colace) and/or laxatives (Miralax, milk of magnesia)  "Power Pudding" is a natural mixture that may help your constipation.  To make blend 1 cup applesauce, 1 cup wheat bran, and 3/4 cup prune juice, refrigerate and then take 1 tablespoon daily with a large glass of water as needed.   For treatment of stress urinary incontinence, which is leakage with physical activity/movement/strainging/coughing, we discussed expectant management versus nonsurgical options versus surgery. Nonsurgical options include weight loss, physical therapy, as well as a pessary.  Surgical options include a midurethral sling, which is a synthetic mesh sling that acts like a hammock under the urethra to prevent leakage of urine, and transurethral injection of a bulking agent.

## 2024-04-28 ENCOUNTER — Encounter: Payer: Self-pay | Admitting: Nurse Practitioner

## 2024-05-05 ENCOUNTER — Other Ambulatory Visit: Payer: Self-pay

## 2024-05-09 LAB — SURGICAL PATHOLOGY

## 2024-05-16 ENCOUNTER — Encounter: Payer: Self-pay | Admitting: Nurse Practitioner

## 2024-05-25 ENCOUNTER — Ambulatory Visit: Payer: Medicare Other

## 2024-05-25 VITALS — Ht 67.0 in | Wt 172.0 lb

## 2024-05-25 DIAGNOSIS — Z0000 Encounter for general adult medical examination without abnormal findings: Secondary | ICD-10-CM

## 2024-05-25 NOTE — Progress Notes (Signed)
 Subjective:   Aimee Mcclain is a 68 y.o. who presents for a Medicare Wellness preventive visit.  As a reminder, Annual Wellness Visits don't include a physical exam, and some assessments may be limited, especially if this visit is performed virtually. We may recommend an in-person follow-up visit with your provider if needed.  Visit Complete: Virtual I connected with  Aimee Mcclain on 05/25/24 by a audio enabled telemedicine application and verified that I am speaking with the correct person using two identifiers.  Patient Location: Home  Provider Location: Office/Clinic  I discussed the limitations of evaluation and management by telemedicine. The patient expressed understanding and agreed to proceed.  Vital Signs: Because this visit was a virtual/telehealth visit, some criteria may be missing or patient reported. Any vitals not documented were not able to be obtained and vitals that have been documented are patient reported.  VideoError- Librarian, academic were attempted between this provider and patient, however failed, due to patient having technical difficulties OR patient did not have access to video capability.  We continued and completed visit with audio only.   Persons Participating in Visit: Patient.  AWV Questionnaire: Yes: Patient Medicare AWV questionnaire was completed by the patient on 05/21/24; I have confirmed that all information answered by patient is correct and no changes since this date.  Cardiac Risk Factors include: advanced age (>28men, >64 women);hypertension     Objective:    Today's Vitals   05/25/24 0815  Weight: 172 lb (78 kg)  Height: 5' 7 (1.702 m)   Body mass index is 26.94 kg/m.     05/25/2024    8:28 AM 05/25/2023    8:28 AM 05/21/2022    8:57 AM 11/04/2021    7:17 AM 09/12/2021    8:05 AM 09/10/2021    9:36 AM 06/20/2021    1:26 PM  Advanced Directives  Does Patient Have a Medical Advance Directive? No Yes  No No Yes No No  Type of Special educational needs teacher of [place];Living will   Healthcare Power of [place];Living will    Does patient want to make changes to medical advance directive?     No - Patient declined    Copy of Healthcare Power of Attorney in Chart?  No - copy requested   No - copy requested    Would patient like information on creating a medical advance directive?   No - Patient declined No - Patient declined No - Patient declined No - Patient declined No - Patient declined    Current Medications (verified) Outpatient Encounter Medications as of 05/25/2024  Medication Sig   acetaminophen  (TYLENOL ) 500 MG tablet Take 1,000 mg by mouth every 6 (six) hours as needed for moderate pain (pain score 4-6).   atorvastatin  (LIPITOR) 20 MG tablet TAKE 1 TABLET BY MOUTH EVERY DAY   Calcium  Carbonate-Vitamin D  (CALCIUM  600+D PO) Take 1 tablet by mouth daily.   Cholecalciferol (VITAMIN D3) 125 MCG (5000 UT) CAPS Take 1 capsule (5,000 Units total) by mouth daily.   Multiple Vitamin (MULTIVITAMIN) tablet Take 1 tablet by mouth daily.   omeprazole  (PRILOSEC) 20 MG capsule Take 20 mg by mouth daily.   tamoxifen  (NOLVADEX ) 20 MG tablet Take 1 tablet (20 mg total) by mouth daily.   valACYclovir  (VALTREX ) 500 MG tablet Take 1 tablet (500 mg total) by mouth 2 (two) times daily as needed.   Vitamin B Complex-C CAPS Take 1 capsule by mouth.   UNABLE TO FIND  Take by mouth daily. Med Name: Massie Soles gummies   two PO daily   No facility-administered encounter medications on file as of 05/25/2024.    Allergies (verified) Ibuprofen, Omnicef [cefdinir], Proton pump inhibitors, Roxicodone  [oxycodone  hcl], and Vioxx [rofecoxib]   History: Past Medical History:  Diagnosis Date   Arthritis    Breast cancer (HCC)    Cataract    Colon polyps    Endometriosis    Family history of colon cancer    Family history of colonic polyps    Fuchs' corneal dystrophy    GERD (gastroesophageal reflux disease)     H/O bilateral breast implants    Heartburn    High blood pressure    History of colon polyps    HPV in female    HSV (herpes simplex virus) infection    Hyperlipidemia    Migraine    no longer having migraines   Osteoarthritis    Osteopenia    Osteopenia    Port-A-Cath in place 03/18/2021   S/P TAH (total abdominal hysterectomy) 1991   Vitamin D  deficiency    Past Surgical History:  Procedure Laterality Date   ABDOMINAL HYSTERECTOMY     BREAST BIOPSY     Benign.   BREAST ENHANCEMENT SURGERY     BREAST LUMPECTOMY Bilateral 1993 and 1995   BREAST LUMPECTOMY WITH RADIOACTIVE SEED AND SENTINEL LYMPH NODE BIOPSY Left 02/13/2021   Procedure: LEFT BREAST LUMPECTOMY WITH RADIOACTIVE SEED AND SENTINEL LYMPH NODE BIOPSY WITH SEED TARGETED NODE;  Surgeon: Lockie Rima, MD;  Location: [facility];  Service: General;  Laterality: Left;   CATARACT EXTRACTION, BILATERAL     CESAREAN SECTION     PARTIAL HYSTERECTOMY     PLACEMENT OF BREAST IMPLANTS  1993   PORT-A-CATH REMOVAL Right 11/04/2021   Procedure: REMOVAL PORT-A-CATH;  Surgeon: Lockie Rima, MD;  Location: [facility];  Service: General;  Laterality: Right;   PORTACATH PLACEMENT Right 02/13/2021   Procedure: INSERTION PORT-A-CATH;  Surgeon: Lockie Rima, MD;  Location: [facility];  Service: General;  Laterality: Right;   TONSILLECTOMY     WISDOM TOOTH EXTRACTION     Family History  Problem Relation Age of Onset   Dementia Maternal Grandmother    Heart disease Maternal Grandfather    Colon cancer Maternal Grandfather        dx over 31   Colon cancer Father        d. 33   Heart disease Mother    Colon cancer Mother        dx late 62s-70s   Rectal cancer Mother 14   Diabetes Brother    Colon polyps Brother    Diabetes Brother    Colon polyps Brother    Lymphoma Maternal Aunt    Heart disease Maternal Uncle    Crohn's disease Daughter    Esophageal cancer Neg Hx    Stomach cancer Neg Hx    Breast  cancer Neg Hx    Social History   Socioeconomic History   Marital status: Married    Spouse name: Aimee Mcclain   Number of children: 2   Years of education: some college   Highest education level: Some college, no degree  Occupational History   Occupation: Airline pilot  Tobacco Use   Smoking status: Former    Current packs/day: 0.00    Average packs/day: 0.5 packs/day for 10.0 years (5.0 ttl pk-yrs)    Types: Cigarettes    Start date: 12/15/1983  Quit date: 12/14/1993    Years since quitting: 30.4   Smokeless tobacco: Never  Vaping Use   Vaping status: Never Used  Substance and Sexual Activity   Alcohol use: Yes    Comment: very rare   Drug use: No   Sexual activity: Yes    Comment: hyst  Other Topics Concern   Not on file  Social History Narrative   Lives at home with husband.   Married 1988.   Right-handed.   Social Drivers of Corporate investment banker Strain: Low Risk  (05/21/2024)   Overall Financial Resource Strain (CARDIA)    Difficulty of Paying Living Expenses: Not hard at all  Food Insecurity: No Food Insecurity (05/21/2024)   Hunger Vital Sign    Worried About Running Out of Food in the Last Year: Never true    Ran Out of Food in the Last Year: Never true  Transportation Needs: No Transportation Needs (05/21/2024)   PRAPARE - Administrator, Civil Service (Medical): No    Lack of Transportation (Non-Medical): No  Physical Activity: Sufficiently Active (05/21/2024)   Exercise Vital Sign    Days of Exercise per Week: 3 days    Minutes of Exercise per Session: 60 min  Stress: No Stress Concern Present (05/21/2024)   [employer] of Occupational Health - Occupational Stress Questionnaire    Feeling of Stress : Not at all  Social Connections: Socially Integrated (05/21/2024)   Social Connection and Isolation Panel    Frequency of Communication with Friends and Family: More than three times a week    Frequency of Social Gatherings with Friends and  Family: Once a week    Attends Religious Services: 1 to 4 times per year    Active Member of [employer] or Organizations: Yes    Attends Engineer, structural: More than 4 times per year    Marital Status: Married    Tobacco Counseling Counseling given: Not Answered    Clinical Intake:  Pre-visit preparation completed: Yes  Pain : No/denies pain     BMI - recorded: 26.94 Nutritional Status: BMI 25 -29 Overweight Nutritional Risks: None Diabetes: No  Lab Results  Component Value Date   HGBA1C 5.4 12/17/2022   HGBA1C 5.3 08/12/2022   HGBA1C 5.2 08/21/2020     How often do you need to have someone help you when you read instructions, pamphlets, or other written materials from your doctor or pharmacy?: 1 - Never  Interpreter Needed?: No  Comments: lives with husband Information entered by :: B.October Peery,LPN   Activities of Daily Living     05/21/2024    9:08 AM  In your present state of health, do you have any difficulty performing the following activities:  Hearing? 0  Vision? 0  Difficulty concentrating or making decisions? 0  Walking or climbing stairs? 0  Dressing or bathing? 0  Doing errands, shopping? 0  Preparing Food and eating ? N  Using the Toilet? N  In the past six months, have you accidently leaked urine? Y  Do you have problems with loss of bowel control? N  Managing your Medications? N  Managing your Finances? N  Housekeeping or managing your Housekeeping? N    Patient Care Team: Donnie Galea, MD as PCP - General (Family Medicine) Amanda Jungling Tomas Fountain, MD (Inactive) as PCP - Cardiology (Cardiology) Lockie Rima, MD as Consulting Physician (General Surgery) Sonja Lannon, MD as Consulting Physician (Hematology) Colie Dawes, MD as Attending Physician (Radiation  Oncology) Ona Bidding, MD as Consulting Physician (Obstetrics and Gynecology) Burton, [name] K, NP as Nurse Practitioner (Nurse Practitioner) Mammography, [facility] as Radiologist  (Diagnostic Radiology) Alvina Axon, MD as Consulting Physician (Ophthalmology)  I have updated your Care Teams any recent Medical Services you may have received from other providers in the past year.     Assessment:   This is a routine wellness examination for Aimee Mcclain.  Hearing/Vision screen Hearing Screening - Comments:: Pt says her hearing is good Vision Screening - Comments:: Pt says her vision is good only readers Dr Nelwyn Bangs   Goals Addressed               This Visit's Progress     Patient stated (pt-stated)   On track     05/25/24-Stay active as possiable      Patient Stated        05/25/24-Lose weight. Pt lost 30lbs and will continue to 10lbs more       Depression Screen     05/25/2024    8:25 AM 05/25/2023    8:27 AM 08/12/2022    7:20 AM 05/21/2022    8:53 AM 05/19/2021    4:08 PM  PHQ 2/9 Scores  PHQ - 2 Score 0 0 2 0 0  PHQ- 9 Score   11  0    Fall Risk     05/21/2024    9:08 AM 05/25/2023    8:29 AM 05/21/2023    8:51 AM 05/21/2022    8:56 AM 05/20/2022    7:27 PM  Fall Risk   Falls in the past year? 0 0 0 0 0  Number falls in past yr:  0 0 0 0  Injury with Fall?  0 0 0 0  Risk for fall due to : No Fall Risks No Fall Risks  No Fall Risks   Follow up Education provided;Falls prevention discussed Falls prevention discussed;Falls evaluation completed       MEDICARE RISK AT HOME:  Medicare Risk at Home If so, are there any without handrails?: (Patient-Rptd) No Home free of loose throw rugs in walkways, pet beds, electrical cords, etc?: (Patient-Rptd) Yes Adequate lighting in your home to reduce risk of falls?: (Patient-Rptd) Yes Life alert?: (Patient-Rptd) No Use of a cane, walker or w/c?: (Patient-Rptd) No Grab bars in the bathroom?: (Patient-Rptd) No Shower chair or bench in shower?: (Patient-Rptd) No Elevated toilet seat or a handicapped toilet?: (Patient-Rptd) No  TIMED UP AND GO:  Was the test performed?  No  Cognitive Function: 6CIT completed         05/25/2024    8:30 AM 05/25/2023    8:31 AM 05/21/2022    8:58 AM  6CIT Screen  What Year? 0 points 0 points 0 points  What month? 0 points 0 points 0 points  What time? 0 points 0 points 0 points  Count back from 20 0 points 0 points 0 points  Months in reverse 0 points 0 points 0 points  Repeat phrase 2 points 0 points 0 points  Total Score 2 points 0 points 0 points    Immunizations Immunization History  Administered Date(s) Administered   Fluad Quad(high Dose 65+) 11/06/2022   Influenza, High Dose Seasonal PF 09/17/2021   Influenza, Quadrivalent, Recombinant, Inj, Pf 09/20/2019   Influenza,inj,Quad PF,6+ Mos 09/21/2020   PFIZER(Purple Top)SARS-COV-2 Vaccination 03/02/2020, 03/23/2020, 11/06/2020, 02/08/2022   Pfizer(Comirnaty)Fall Seasonal Vaccine 12 years and older 11/06/2022    Screening Tests Health Maintenance  Topic Date  Due   DTaP/Tdap/Td (1 - Tdap) Never done   Pneumococcal Vaccine: 50+ Years (1 of 2 - PCV) Never done   Zoster Vaccines- Shingrix (1 of 2) Never done   COVID-19 Vaccine (6 - 2024-25 season) 08/15/2023   INFLUENZA VACCINE  07/14/2024   MAMMOGRAM  01/16/2025   Medicare Annual Wellness (AWV)  05/25/2025   Colonoscopy  09/19/2028   DEXA SCAN  Completed   Hepatitis C Screening  Completed   HPV VACCINES  Aged Out   Meningococcal B Vaccine  Aged Out    Health Maintenance  Health Maintenance Due  Topic Date Due   DTaP/Tdap/Td (1 - Tdap) Never done   Pneumococcal Vaccine: 50+ Years (1 of 2 - PCV) Never done   Zoster Vaccines- Shingrix (1 of 2) Never done   COVID-19 Vaccine (6 - 2024-25 season) 08/15/2023   Health Maintenance Items Addressed: Pt if decides to get Shingles will go to her pharmacy. Pt wants to discuss Pneumococcal vaccine with provider at next visit to receive.  Additional Screening:  Vision Screening: Recommended annual ophthalmology exams for early detection of glaucoma and other disorders of the eye. Would you like a referral  to an eye doctor? No    Dental Screening: Recommended annual dental exams for proper oral hygiene  Community Resource Referral / Chronic Care Management: CRR required this visit?  No   CCM required this visit?  No pt declined says will schedule when returns from vacation for CPE.   Plan:    I have personally reviewed and noted the following in the patient's chart:   Medical and social history Use of alcohol, tobacco or illicit drugs  Current medications and supplements including opioid prescriptions. Patient is not currently taking opioid prescriptions. Functional ability and status Nutritional status Physical activity Advanced directives List of other physicians Hospitalizations, surgeries, and ER visits in previous 12 months Vitals Screenings to include cognitive, depression, and falls Referrals and appointments  In addition, I have reviewed and discussed with patient certain preventive protocols, quality metrics, and best practice recommendations. A written personalized care plan for preventive services as well as general preventive health recommendations were provided to patient.   Nerissa Bannister, LPN   1/61/0960   After Visit Summary: (MyChart) Due to this being a telephonic visit, the after visit summary with patients personalized plan was offered to patient via MyChart   Notes: Nothing significant to report at this time.

## 2024-05-25 NOTE — Patient Instructions (Addendum)
 Aimee Mcclain , Thank you for taking time out of your busy schedule to complete your Annual Wellness Visit with me. I enjoyed our conversation and look forward to speaking with you again next year. I, as well as your care team,  appreciate your ongoing commitment to your health goals. Please review the following plan we discussed and let me know if I can assist you in the future. Your Game plan/ To Do List     Follow up Visits: Next Medicare AWV with our clinical staff: 05/29/25 @ 8:10am televisit   Have you seen your provider in the last 6 months (3 months if uncontrolled diabetes)? No Next Office Visit with your provider: will schedule when returns from vacation  Clinician Recommendations:  Aim for 30 minutes of exercise or brisk walking, 6-8 glasses of water, and 5 servings of fruits and vegetables each day.       This is a list of the screening recommended for you and due dates:  Health Maintenance  Topic Date Due   DTaP/Tdap/Td vaccine (1 - Tdap) Never done   Pneumococcal Vaccine for age over 38 (1 of 2 - PCV) Never done   Zoster (Shingles) Vaccine (1 of 2) Never done   COVID-19 Vaccine (6 - 2024-25 season) 08/15/2023   Flu Shot  07/14/2024   Mammogram  01/16/2025   Medicare Annual Wellness Visit  05/25/2025   Colon Cancer Screening  09/19/2028   DEXA scan (bone density measurement)  Completed   Hepatitis C Screening  Completed   HPV Vaccine  Aged Out   Meningitis B Vaccine  Aged Out    Advanced directives: (Declined) Advance directive discussed with you today. Even though you declined this today, please call our office should you change your mind, and we can give you the proper paperwork for you to fill out. Advance Care Planning is important because it:  [x]  Makes sure you receive the medical care that is consistent with your values, goals, and preferences  [x]  It provides guidance to your family and loved ones and reduces their decisional burden about whether or not they are  making the right decisions based on your wishes.  Follow the link provided in your after visit summary or read over the paperwork we have mailed to you to help you started getting your Advance Directives in place. If you need assistance in completing these, please reach out to us  so that we can help you!

## 2024-06-20 ENCOUNTER — Encounter: Payer: Self-pay | Admitting: Nurse Practitioner

## 2024-06-21 ENCOUNTER — Telehealth: Payer: Self-pay | Admitting: Nurse Practitioner

## 2024-06-21 NOTE — Telephone Encounter (Signed)
 Called to reschedule patient appointment to due provider pal  request. I talked  to patient and they are aware of the changes that was made to the upcoming appointment

## 2024-06-25 NOTE — Progress Notes (Unsigned)
 Patient Care Team: Aimee Mcclain RAMAN, MD as PCP - General (Family Medicine) Alvan, Aimee BRAVO, MD (Inactive) as PCP - Cardiology (Cardiology) Aimee Shoulders, MD as Consulting Physician (General Surgery) Aimee Callander, MD as Consulting Physician (Hematology) Aimee Domino, MD as Attending Physician (Radiation Oncology) Aimee Pool, MD as Consulting Physician (Obstetrics and Gynecology) Mcclain, Aimee K, NP as Nurse Practitioner (Nurse Practitioner) Mammography, Aimee Mcclain as Radiologist (Diagnostic Radiology) Aimee Arlyss, MD as Consulting Physician (Ophthalmology)  Clinic Day:  06/26/2024  Referring physician: Cleatus Mcclain RAMAN, MD  ASSESSMENT & PLAN:   Assessment & Plan: Malignant neoplasm of upper-inner quadrant of left breast in female, 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 Aron -Staging work-up showed small indeterminate lung nodules and an indeterminate left parietal bone lesion which will monitor with future scans, otherwise negative for distant metastasis -She completed 10 of 12 doses of adjuvant Taxol /Abraxane  and herceptin  on 05/21/21, last 2 doses canceled due to her worsening neuropathy. -s/p radiation 06/30/21 - 07/25/21 under Dr. Izell. -she took exemestane  08/2021 - 01/2022, discontinued due to constipation and increased joint/bone pain. She switched to tamoxifen  in 03/2022 and is tolerating better. -Due to her back pain, a bone scan was obtained in February 2024, which was negative for definitive bone metastasis.  There is mild uptake in the right periorbital region, unclear etiology, head CT revealed a stable benign appearing left parietal bone lesion, no f/up recommended  - Reviewed right breast biopsy results from May 2025 which were benign.  She has now developed fluid-filled cyst in the lower inner quadrant of the right breast.  Will get new ultrasound of the lower inner  quadrant of the right breast.  Additional imaging and evaluation to be set up as indicated. Continue tamoxifen  20 mg daily. Labs and follow-up as scheduled in October 2025.   Right breast lump Reviewed benign biopsy results or right breast lumps. She now has developed a soft, smooth, fluid-filled lump in the lower inner quadrant of the right breast. This was not present prior to the recent biopsy. Mass is non tender.  It is not fixed.  She states she noticed this about 2 weeks ago.  Gradually getting larger.  Most noticeable when she is sitting or standing up and raises her arm over her head.  Plan Will get new right breast ultrasound of lower inner quadrant of the right breast. Will contact patient with results and plan for additional imaging if indicated. Continue tamoxifen  20 mg daily. Labs and follow-up as currently scheduled in October 2025.   The patient understands the plans discussed today and is in agreement with them.  She knows to contact our office if she develops concerns prior to her next appointment.  I provided 20 minutes of face-to-face time during this encounter and > 50% was spent counseling as documented under my assessment and plan.    Aimee Mcclain Lessen, NP  [facility] - A DEPT OF [facility] [address] [place] [address] Dept: [phone] Dept Fax: [phone]   Orders Placed This Encounter  Procedures   MS US  BREAST LTD UNI LEFT INC AXILLA    Standing Status:   Future    Expected Date:   06/27/2024    Expiration Date:   06/26/2025    Reason for Exam (SYMPTOM  OR DIAGNOSIS REQUIRED):   fluid filled cyst  like area inner, lower quadrant of right reast             patient [facility] to [facility]    Preferred imaging location?:   External      CHIEF COMPLAINT:  CC: Left breast cancer  Current Treatment: Currently on tamoxifen  (02/2022).  Antiestrogen therapy started 08/2021.  INTERVAL  HISTORY:  Aimee Mcclain is here today for repeat clinical assessment.  Patient last saw [name], NP, on 04/10/2024. She had biopsies done of right breast after her visit.  She has developed lumps under the breast, adjacent to prior biopsy site. Results were benign. There was stromal fibrosis with few small cysts.  The patient states that though she had 2 biopsies, only 1 needle placement was made in into the inner lower quadrant of the right breast.  She remembers someone saying something about things getting cloudy on the ultrasound.  She is unsure if maybe there was a small puncture into the breast implant.  She noticed this new lump approximately 2 weeks ago.  States that her husband was able to visualize the lump even without raising her arm up.  It is not painful.  She has noted no other changes in either breast.  She denies fevers or chills. She denies pain. Her appetite is good. Her weight has increased 6 pounds over last 2 months.  I have reviewed the past medical history, past surgical history, social history and family history with the patient and they are unchanged from previous note.  ALLERGIES:  is allergic to ibuprofen, omnicef [cefdinir], proton pump inhibitors, roxicodone  [oxycodone  hcl], and vioxx [rofecoxib].  MEDICATIONS:  Current Outpatient Medications  Medication Sig Dispense Refill   acetaminophen  (TYLENOL ) 500 MG tablet Take 1,000 mg by mouth every 6 (six) hours as needed for moderate pain (pain score 4-6).     atorvastatin  (LIPITOR) 20 MG tablet TAKE 1 TABLET BY MOUTH EVERY DAY 90 tablet 3   Calcium  Carbonate-Vitamin D  (CALCIUM  600+D PO) Take 1 tablet by mouth daily.     Cholecalciferol (VITAMIN D3) 125 MCG (5000 UT) CAPS Take 1 capsule (5,000 Units total) by mouth daily. 30 capsule 0   Multiple Vitamin (MULTIVITAMIN) tablet Take 1 tablet by mouth daily.     omeprazole  (PRILOSEC) 20 MG capsule Take 20 mg by mouth daily.     tamoxifen  (NOLVADEX ) 20 MG tablet Take 1 tablet (20 mg total)  by mouth daily. 90 tablet 3   UNABLE TO FIND Take by mouth daily. Med Name: WEEM gummies   two PO daily     valACYclovir  (VALTREX ) 500 MG tablet Take 1 tablet (500 mg total) by mouth 2 (two) times daily as needed.     Vitamin B Complex-C CAPS Take 1 capsule by mouth.     No current facility-administered medications for this visit.    HISTORY OF PRESENT ILLNESS:   Oncology History Overview Note  Cancer Staging Malignant neoplasm of upper-inner quadrant of left breast in female, estrogen receptor positive (HCC) Staging form: Breast, AJCC 8th Edition - Clinical stage from 01/14/2020: Stage IB (cT1c, cN1, cM0, G3, ER+, PR+, HER2+) - Signed by Aimee Callander, MD on 01/21/2021 Stage prefix: Initial diagnosis - Pathologic stage from 02/13/2021: Stage IA (pT2, pN0, cM0, G3, ER+, PR+, HER2+) - Signed by Heilingoetter, Cassandra L, PA-C on 03/07/2021 Stage prefix: Initial diagnosis Nuclear grade: G3 Histologic grading system: 3 grade system    Malignant neoplasm of upper-inner quadrant of left breast in female, estrogen receptor positive (HCC)  01/14/2020 Cancer  Staging   Staging form: Breast, AJCC 8th Edition - Clinical stage from 01/14/2020: Stage IB (cT1c, cN1, cM0, G3, ER+, PR+, HER2+) - Signed by Aimee Callander, MD on 01/21/2021 Stage prefix: Initial diagnosis   01/02/2021 Mammogram   Left breast with 1.9x1x1.8 cm complex cyst at 12:00 position, 4cmfn, versus saloid mass with calcifications, irregulr, increased in size. Biopsy recommended.    01/13/2021 Initial Biopsy   Diagnosis 1. Breast, left, needle core biopsy, 11 o'clock, 4cmfn - INVASIVE DUCTAL CARCINOMA - SEE COMMENT 2. Lymph node, needle/core biopsy, left axilla - FIBROVASCULAR AND ADIPOSE TISSUE - NO LYMPHOID TISSUE OR CARCINOMA IDENTIFIED Microscopic Comment 1. Based on the biopsy, the carcinoma appears Nottingham grade 3 of 3 and measures 1.2 cm in greatest linear extent. Prognostic markers (ER/PR/ki-67/HER2) are pending and will be  reported in an addendum. Dr. Jodene reviewed the case and agrees with the above diagnosis. These results were called to [facility] on January 14, 2021   01/13/2021 Receptors her2   1. PROGNOSTIC INDICATORS Results: IMMUNOHISTOCHEMICAL AND MORPHOMETRIC ANALYSIS PERFORMED MANUALLY The tumor cells are POSITIVE for Her2 (3+). Estrogen Receptor: 95%, POSITIVE, STRONG STAINING INTENSITY Progesterone  Receptor: 85%, POSITIVE, STRONG STAINING INTENSITY Proliferation Marker Ki67: 30%   01/16/2021 Initial Diagnosis   Malignant neoplasm of upper-inner quadrant of left breast in female, estrogen receptor positive (HCC)   01/23/2021 Imaging   MRI Breast  IMPRESSION: 1. Known malignancy measuring 2.9 centimeters in the UPPER INNER QUADRANT of the LEFT breast. There is no evidence for involvement of the pectoralis muscle or implant. 2. No evidence for lymphadenopathy. 3. RIGHT breast is negative. 4. Bilateral retropectoral saline implants.   02/09/2021 Imaging   MRI Brain IMPRESSION: No evidence of intracranial metastatic disease.   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     02/13/2021 Surgery   LEFT BREAST LUMPECTOMY WITH RADIOACTIVE SEED AND SENTINEL LYMPH NODE BIOPSY WITH SEED TARGETED NODE and PAC Placed by Dr Aimee   02/13/2021 Pathology Results   FINAL MICROSCOPIC DIAGNOSIS:   A. BREAST, LEFT, LUMPECTOMY:  - Invasive ductal carcinoma, 2.6 cm, Nottingham grade 3 of 3.  - Ductal carcinoma in situ, high grade with central necrosis.  - Margins of resection:       - Invasive carcinoma broadly involves the anterior and inferior  margins.  Invasive carcinoma is focally < 1 mm from each the superior  and medial margins.       - DCIS is focally < 1 mm from the inferior margin.  DCIS is focally  1 mm from each the anterior and superior margins.  DCIS is focally 1-2  mm from the medial margin.  - Biopsy clip.  - See oncology table.   B. BREAST, LEFT, ANTERIOR TISSUE, EXCISION:  -  Residual invasive and in-situ ductal carcinoma.  - Residual invasive ductal carcinoma broadly involves the posterior  margin.  - Residual DCIS is focally 1 mm from the posterior margin.   C. BREAST, LEFT, INFERIOR MARGIN, EXCISION:  - No carcinoma identified.   D. SENTINEL LYMPH NODE, LEFT AXILLARY #1, BIOPSY:  - No carcinoma identified in one lymph node (0/1).   E. SENTINEL LYMPH NODE, LEFT AXILLARY #2, BIOPSY:  - Biopsy site.  - No distinct nodal tissue identified.  - No carcinoma identified.   F. SENTINEL LYMPH NODE, LEFT AXILLARY #3, BIOPSY:  - No carcinoma identified in one lymph node (0/1).   G. SENTINEL LYMPH NODE, LEFT AXILLARY #4, BIOPSY:  - No carcinoma identified in one lymph node (  0/1).    02/13/2021 Cancer Staging   Staging form: Breast, AJCC 8th Edition - Pathologic stage from 02/13/2021: Stage IA (pT2, pN0, cM0, G3, ER+, PR+, HER2+) - Signed by Heilingoetter, Cassandra L, PA-C on 03/07/2021 Stage prefix: Initial diagnosis Nuclear grade: G3 Histologic grading system: 3 grade system   03/18/2021 - 02/19/2022 Chemotherapy   Patient is on Treatment Plan : BREAST Paclitaxel  + Trastuzumab  q7d / Trastuzumab  q21d     06/2021 - 07/2021 Radiation Therapy   Adjuvant radiation per Dr. Izell   08/20/2021 Imaging   CT Chest w/o contrast  IMPRESSION: No definite evidence of pulmonary metastatic disease.   5 mm indeterminate right lower lobe pulmonary nodule. Comparison with prior examinations, if available, is recommended to determine chronicity. No follow-up needed if patient is low-risk. In absence of priors, Non-contrast chest CT can be considered in 12 months if patient is high-risk. This recommendation follows the consensus statement: Guidelines for Management of Incidental Pulmonary Nodules Detected on CT Images: From the Fleischner Society 2017; Radiology 2017; 284:228-243.   Mild coronary artery calcification.   08/20/2021 Imaging   CT Head w/o contrast  IMPRESSION: No  acute abnormality. No evidence of intracranial metastatic disease.   08/2021 -  Anti-estrogen oral therapy   Began adjuvant exemestane ; changed to tamoxifen  02/2022   11/13/2021 Survivorship   SCP delivered by Aimee Burton, NP       REVIEW OF SYSTEMS:   Constitutional: Denies fevers, chills or abnormal weight loss Eyes: Denies blurriness of vision Ears, nose, mouth, throat, and face: Denies mucositis or sore throat Respiratory: Denies cough, dyspnea or wheezes Cardiovascular: Denies palpitation, chest discomfort or lower extremity swelling Gastrointestinal:  Denies nausea, heartburn or change in bowel habits Skin: Denies abnormal skin rashes Lymphatics: Denies new lymphadenopathy or easy bruising Neurological:Denies numbness, tingling or new weaknesses Behavioral/Psych: Mood is stable, no new changes  All other systems were reviewed with the patient and are negative.   VITALS:   Today's Vitals   06/26/24 0820  BP: 110/68  Pulse: 71  Resp: 17  Temp: 98.4 F (36.9 C)  SpO2: 99%  Weight: 178 lb 11.2 oz (81.1 kg)   Body mass index is 27.99 kg/m.   Wt Readings from Last 3 Encounters:  06/26/24 178 lb 11.2 oz (81.1 kg)  05/25/24 172 lb (78 kg)  04/26/24 172 lb (78 kg)    Body mass index is 27.99 kg/m.  Performance status (ECOG): 1 - Symptomatic but completely ambulatory  PHYSICAL EXAM:   GENERAL:alert, no distress and comfortable SKIN: skin color, texture, turgor are normal, no rashes or significant lesions EYES: normal, Conjunctiva are pink and non-injected, sclera clear OROPHARYNX:no exudate, no erythema and lips, buccal mucosa, and tongue normal  NECK: supple, thyroid normal size, non-tender, without nodularity LYMPH:  no palpable lymphadenopathy in the cervical, axillary or inguinal LUNGS: clear to auscultation and percussion with normal breathing effort HEART: regular rate & rhythm and no murmurs and no lower extremity edema ABDOMEN:abdomen soft, non-tender  and normal bowel sounds Musculoskeletal:no cyanosis of digits and no clubbing  NEURO: alert & oriented x 3 with fluent speech, no focal motor/sensory deficits BREAST: There is a soft, fluid-filled mass in the lower inner quadrant of the right breast.  Contour is smooth.  Mass is nonfixed.  Measures approximately 3 cm in diameter.  No other palpable masses or lumps.  Implant feels intact.  There is no nipple inversion or nipple discharge.  There is no axillary lymphadenopathy on the right.  Intact  implant noted in the left breast.  Well-healed lumpectomy scar present.  No nipple inversion or nipple discharge.  There is no axillary lymphadenopathy on the left.  LABORATORY DATA:  I have reviewed the data as listed    Component Value Date/Time   NA 141 04/10/2024 1034   NA 144 12/17/2022 1225   K 4.5 04/10/2024 1034   CL 109 04/10/2024 1034   CO2 27 04/10/2024 1034   GLUCOSE 98 04/10/2024 1034   BUN 21 04/10/2024 1034   BUN 16 12/17/2022 1225   CREATININE 0.85 04/10/2024 1034   CALCIUM  9.6 04/10/2024 1034   PROT 7.0 04/10/2024 1034   PROT 6.8 12/17/2022 1225   ALBUMIN 4.8 04/10/2024 1034   ALBUMIN 4.6 12/17/2022 1225   AST 31 04/10/2024 1034   ALT 37 04/10/2024 1034   ALKPHOS 53 04/10/2024 1034   BILITOT 0.6 04/10/2024 1034   GFRNONAA >60 04/10/2024 1034     Lab Results  Component Value Date   WBC 5.5 04/10/2024   NEUTROABS 3.3 04/10/2024   HGB 13.8 04/10/2024   HCT 40.1 04/10/2024   MCV 88.3 04/10/2024   PLT 152 04/10/2024

## 2024-06-25 NOTE — Assessment & Plan Note (Signed)
 cT1cN1M0 stage Ib, triple positive, pT2N0 stage IA, grade 3 -Diagnosed 12/2020, s/p left lumpectomy and SLNB 02/13/21 by Dr. Aron -Staging work-up showed small indeterminate lung nodules and an indeterminate left parietal bone lesion which will monitor with future scans, otherwise negative for distant metastasis -She completed 10 of 12 doses of adjuvant Taxol /Abraxane  and herceptin  on 05/21/21, last 2 doses canceled due to her worsening neuropathy. -s/p radiation 06/30/21 - 07/25/21 under Dr. Izell. -she took exemestane  08/2021 - 01/2022, discontinued due to constipation and increased joint/bone pain. She switched to tamoxifen  in 03/2022 and is tolerating better. -Due to her back pain, a bone scan was obtained in February 2024, which was negative for definitive bone metastasis.  There is mild uptake in the right periorbital region, unclear etiology, head CT revealed a stable benign appearing left parietal bone lesion, no f/up recommended  - Reviewed right breast biopsy results from May 2025 which were benign.  She has now developed fluid-filled cyst in the lower inner quadrant of the right breast.  Will get new ultrasound of the lower inner quadrant of the right breast.  Additional imaging and evaluation to be set up as indicated. Continue tamoxifen  20 mg daily. Labs and follow-up as scheduled in October 2025.

## 2024-06-26 ENCOUNTER — Inpatient Hospital Stay: Attending: Nurse Practitioner | Admitting: Nurse Practitioner

## 2024-06-26 ENCOUNTER — Ambulatory Visit: Attending: General Surgery

## 2024-06-26 ENCOUNTER — Encounter: Payer: Self-pay | Admitting: Nurse Practitioner

## 2024-06-26 ENCOUNTER — Ambulatory Visit: Payer: Medicare Other

## 2024-06-26 VITALS — Wt 177.4 lb

## 2024-06-26 VITALS — BP 110/68 | HR 71 | Temp 98.4°F | Resp 17 | Wt 178.7 lb

## 2024-06-26 DIAGNOSIS — Z79899 Other long term (current) drug therapy: Secondary | ICD-10-CM | POA: Diagnosis not present

## 2024-06-26 DIAGNOSIS — Z170 Estrogen receptor positive status [ER+]: Secondary | ICD-10-CM | POA: Insufficient documentation

## 2024-06-26 DIAGNOSIS — C50212 Malignant neoplasm of upper-inner quadrant of left female breast: Secondary | ICD-10-CM | POA: Insufficient documentation

## 2024-06-26 DIAGNOSIS — Z1721 Progesterone receptor positive status: Secondary | ICD-10-CM | POA: Diagnosis not present

## 2024-06-26 DIAGNOSIS — N6314 Unspecified lump in the right breast, lower inner quadrant: Secondary | ICD-10-CM

## 2024-06-26 DIAGNOSIS — Z483 Aftercare following surgery for neoplasm: Secondary | ICD-10-CM | POA: Insufficient documentation

## 2024-06-26 DIAGNOSIS — Z79810 Long term (current) use of selective estrogen receptor modulators (SERMs): Secondary | ICD-10-CM | POA: Diagnosis not present

## 2024-06-26 DIAGNOSIS — Z923 Personal history of irradiation: Secondary | ICD-10-CM | POA: Insufficient documentation

## 2024-06-26 NOTE — Therapy (Signed)
 OUTPATIENT PHYSICAL THERAPY SOZO SCREENING NOTE   Patient Name: Aimee Mcclain MRN: [id number] DOB:06-19-1956, 68 y.o., female Today's Date: 06/26/2024  PCP: Cleatus Arlyss RAMAN, MD REFERRING PROVIDER: Aron Shoulders, MD   PT End of Session - 06/26/24 1504     Visit Number 3   # unchanged due to screen only   PT Start Time 1502    PT Stop Time 1506    PT Time Calculation (min) 4 min    Activity Tolerance Patient tolerated treatment well    Behavior During Therapy [facility] for tasks assessed/performed          Past Medical History:  Diagnosis Date   Arthritis    Breast cancer (HCC)    Cataract    Colon polyps    Endometriosis    Family history of colon cancer    Family history of colonic polyps    Fuchs' corneal dystrophy    GERD (gastroesophageal reflux disease)    H/O bilateral breast implants    Heartburn    High blood pressure    History of colon polyps    HPV in female    HSV (herpes simplex virus) infection    Hyperlipidemia    Migraine    no longer having migraines   Osteoarthritis    Osteopenia    Osteopenia    Port-A-Cath in place 03/18/2021   S/P TAH (total abdominal hysterectomy) 1991   Vitamin D  deficiency    Past Surgical History:  Procedure Laterality Date   ABDOMINAL HYSTERECTOMY     BREAST BIOPSY     Benign.   BREAST ENHANCEMENT SURGERY     BREAST LUMPECTOMY Bilateral 1993 and 1995   BREAST LUMPECTOMY WITH RADIOACTIVE SEED AND SENTINEL LYMPH NODE BIOPSY Left 02/13/2021   Procedure: LEFT BREAST LUMPECTOMY WITH RADIOACTIVE SEED AND SENTINEL LYMPH NODE BIOPSY WITH SEED TARGETED NODE;  Surgeon: Aron Shoulders, MD;  Location: [facility];  Service: General;  Laterality: Left;   CATARACT EXTRACTION, BILATERAL     CESAREAN SECTION     PARTIAL HYSTERECTOMY     PLACEMENT OF BREAST IMPLANTS  1993   PORT-A-CATH REMOVAL Right 11/04/2021   Procedure: REMOVAL PORT-A-CATH;  Surgeon: Aron Shoulders, MD;  Location: [facility];  Service: General;   Laterality: Right;   PORTACATH PLACEMENT Right 02/13/2021   Procedure: INSERTION PORT-A-CATH;  Surgeon: Aron Shoulders, MD;  Location: [facility];  Service: General;  Laterality: Right;   TONSILLECTOMY     WISDOM TOOTH EXTRACTION     Patient Active Problem List   Diagnosis Date Noted   SUI (stress urinary incontinence, female) 04/26/2024   Chronic idiopathic constipation 04/26/2024   Urinary frequency 04/26/2024   Elevated blood pressure reading without diagnosis of hypertension 11/09/2023   Non-alcoholic fatty liver disease 06/24/2022   Endometriosis 06/24/2022   COVID-19 virus infection 05/28/2022   Paronychia of great toe, right 01/16/2022   Pain of right great toe 01/16/2022   Loose toenail 01/16/2022   Leukopenia 12/17/2021   Exposure to COVID-19 virus 12/17/2021   HTN (hypertension) 06/22/2021   Herpes simplex 06/19/2021   Osteopenia 06/19/2021   Medicare welcome exam 05/21/2021   Malignant neoplasm of upper-inner quadrant of left breast in female, 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   Chronic migraine 08/21/2020   Screening mammogram, encounter for 06/30/2020   Paresthesia 06/09/2020   Neuropathy 02/14/2020   Atypical chest pain 02/14/2020   Healthcare maintenance 02/14/2020   Migraine    Advance care planning 01/16/2019  Family history of colon cancer     REFERRING DIAG: left breast cancer at risk for lymphedema  THERAPY DIAG: Aftercare following surgery for neoplasm  PERTINENT HISTORY: Patient was diagnosed on 01/02/2021 with 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 Ki67 is 30%. Patient underwent a left lumpectomy and sentinel node biopsy (4 negative nodes) on 02/13/2021. Radiation complete 07/28/21   PRECAUTIONS: left UE Lymphedema risk, None  SUBJECTIVE: Pt returns for her last 6 month L-Dex screen.   PAIN:  Are you having pain? No  SOZO SCREENING: Patient was assessed today using the SOZO machine to  determine the lymphedema index score. This was compared to her baseline score. It was determined that she is within the recommended range when compared to her baseline and no further action is needed at this time. She will continue SOZO screenings. These are done every 3 months for 2 years post operatively followed by every 6 months for 2 years, and then annually.   L-DEX FLOWSHEETS - 06/26/24 1500       L-DEX LYMPHEDEMA SCREENING   Measurement Type Unilateral    L-DEX MEASUREMENT EXTREMITY Upper Extremity    POSITION  Standing    DOMINANT SIDE Right    At Risk Side Left    BASELINE SCORE (UNILATERAL) 2.6    L-DEX SCORE (UNILATERAL) -0.8    VALUE CHANGE (UNILAT) -3.4         P: Pt would like to transition to annual.   Aden Berwyn Caldron, PTA 06/26/2024, 3:05 PM

## 2024-07-26 ENCOUNTER — Telehealth: Payer: Self-pay | Admitting: Cardiology

## 2024-07-26 NOTE — Telephone Encounter (Signed)
 Prev [facility] patient but now would like to see dr Sheena  Pt c/o of Chest Pain: STAT if active (IN THIS MOMENT) CP, including tightness, pressure, jaw pain, shoulder/upper arm/back pain, SOB, nausea, and vomiting.  1. Are you having CP right now (tightness, pressure, or discomfort)? Pressure (heaviness)   2. Are you experiencing any other symptoms (ex. SOB, nausea, vomiting, sweating)? no  3. How long have you been experiencing CP? Last Friday   4. Is your CP continuous or coming and going? Coming and going   5. Have you taken Nitroglycerin ? no  6. If CP returns before callback, please consider calling 911. ?

## 2024-07-26 NOTE — Telephone Encounter (Signed)
 Called pt in regards to CP.  Reports started about 2 Fridays ago while at the [place].  Felt like would pass out with exertion.  Became really weak with chest heaviness also had pain in neck and a slight HA.  Discomfort lasted 1-2 minutes.   Went to gym today felt okay.  Came home after lunch had a little chest discomfort that lasted about 5 minutes.  Pain was to center of chest between breast.    Pt reports is not currently having any CP.  Denies jaw, neck, shoulder pain.  Reports feels like heart is flipping around at times causes her to cough then goes away.  Denies increased caffeine intake drinks 2 big cups of coffee each morning this is normal for pt. No new stress or recent illnesses.  BP yesterday 130/70-63.    Advised pt of ED precautions will send message to Provider for recommendation.  Pt has on OV with Cleaver, PA on 08/07/24.

## 2024-08-04 NOTE — Progress Notes (Unsigned)
 Cardiology Clinic Note   Patient Name: Aimee Mcclain Date of Encounter: 08/07/2024  Primary Care Provider:  Cleatus Arlyss RAMAN, MD Primary Cardiologist:  Alvan Ronal BRAVO, MD (Inactive)  Patient Profile    Aimee Mcclain 68 year old female presents to the clinic today for follow-up evaluation of her hypertension and palpitations.  Past Medical History    Past Medical History:  Diagnosis Date   Arthritis    Breast cancer (HCC)    Cataract    Colon polyps    Endometriosis    Family history of colon cancer    Family history of colonic polyps    Fuchs' corneal dystrophy    GERD (gastroesophageal reflux disease)    H/O bilateral breast implants    Heartburn    High blood pressure    History of colon polyps    HPV in female    HSV (herpes simplex virus) infection    Hyperlipidemia    Migraine    no longer having migraines   Osteoarthritis    Osteopenia    Osteopenia    Port-A-Cath in place 03/18/2021   S/P TAH (total abdominal hysterectomy) 1991   Vitamin D  deficiency    Past Surgical History:  Procedure Laterality Date   ABDOMINAL HYSTERECTOMY     BREAST BIOPSY     Benign.   BREAST ENHANCEMENT SURGERY     BREAST LUMPECTOMY Bilateral 1993 and 1995   BREAST LUMPECTOMY WITH RADIOACTIVE SEED AND SENTINEL LYMPH NODE BIOPSY Left 02/13/2021   Procedure: LEFT BREAST LUMPECTOMY WITH RADIOACTIVE SEED AND SENTINEL LYMPH NODE BIOPSY WITH SEED TARGETED NODE;  Surgeon: Aron Shoulders, MD;  Location: [facility];  Service: General;  Laterality: Left;   CATARACT EXTRACTION, BILATERAL     CESAREAN SECTION     PARTIAL HYSTERECTOMY     PLACEMENT OF BREAST IMPLANTS  1993   PORT-A-CATH REMOVAL Right 11/04/2021   Procedure: REMOVAL PORT-A-CATH;  Surgeon: Aron Shoulders, MD;  Location: [facility];  Service: General;  Laterality: Right;   PORTACATH PLACEMENT Right 02/13/2021   Procedure: INSERTION PORT-A-CATH;  Surgeon: Aron Shoulders, MD;  Location: [facility];  Service: General;  Laterality: Right;   TONSILLECTOMY     WISDOM TOOTH EXTRACTION      Allergies  Allergies  Allergen Reactions   Ibuprofen Swelling    She has tolerated aleve   Omnicef [Cefdinir] Itching   Proton Pump Inhibitors     Headache, but Can tolerate prilosec   Roxicodone  [Oxycodone  Hcl] Rash    Developed rash on face and chest   Vioxx [Rofecoxib] Swelling and Rash    History of Present Illness    Aimee Mcclain has a PMH of breast cancer, migraines, palpitations, and hypertension.  Her CT showed mild CAC.  Her 10-year ASCVD score was previously calculated at 5.6%.  Her LDL was previously noted to be greater than 100 and she was started on atorvastatin .  Echocardiogram 3/22 showed an EF of 60 to 65%, strain -22, normal RV and no valvular disease.  Echocardiogram 03/20/2022 showed GLS-22.  Her EKG 01/13/2023 showed normal sinus rhythm.  She was previously seen and evaluated by Dr. Ronal Alvan on 01/24/2024.  During that time she noted that when she would exert herself at the gym she would feel like she may pass out and feel dizzy.  She felt fine with normal daily activities.  She was able to carry groceries and go up stairs without significant dyspnea on exertion or  chest discomfort.  She did note mild dyspnea.  It was felt that her dyspnea was related to deconditioning.  Her atorvastatin  20 mg daily was continued.  A 7-day cardiac event monitor was ordered and showed a minimum heart rate of 43, maximum heart rate of 185, average heart rate of 70 bpm.  She was prominently in sinus rhythm.  She was noted to have 12 episodes of SVT with the fastest interval lasting 16 beats and the max heart rate during these episodes was 185.  The longest episode lasted for 9.8 seconds and had an average heart rate of 142 bpm.  She reported some leg pain which improved when she was changed to tamoxifen .  She denied history of cardiac disease.  Her mother has atrial fibrillation.  Her maternal  grandfather and uncle had heart disease.  Her father died of MI at age 9.  She smoked for about 10 years.  She presents to the clinic today for follow-up evaluation and states she has started feeling worse over the past 2 weeks.  She notices pressure in her chest and throat.  She feels that she is having more episodes similar to what she was having before.  She describes episodes as short-lived but notices dizzy feelings with these.  We reviewed her previous event monitor.  She expressed understanding.  She does note some blood in her stool which she attributes to hemorrhoids.  She describes it as bright red.  Her blood pressure is well-controlled today.  Her heart rate and EKG are reassuring.  Due to increased episodes I will order echocardiogram, CBC, BMP, magnesium, and repeat her cardiac event monitor.  We will plan follow-up in around 6 weeks.  She notes that she does try to maintain her hydration and regularly carries around water.  She has been trying to reduce her diet drinks.  She does have coffee in the mornings.  She does not drink alcohol.     Home Medications    Prior to Admission medications   Medication Sig Start Date End Date Taking? Authorizing Provider  acetaminophen  (TYLENOL ) 500 MG tablet Take 1,000 mg by mouth every 6 (six) hours as needed for moderate pain (pain score 4-6).    [provider]  atorvastatin  (LIPITOR) 20 MG tablet TAKE 1 TABLET BY MOUTH EVERY DAY 01/31/24   Alvan Ronal BRAVO, MD  Calcium  Carbonate-Vitamin D  (CALCIUM  600+D PO) Take 1 tablet by mouth daily.    [provider]  Cholecalciferol (VITAMIN D3) 125 MCG (5000 UT) CAPS Take 1 capsule (5,000 Units total) by mouth daily. 01/14/23   [place]keley Adelita PENNER, MD  omeprazole  (PRILOSEC) 20 MG capsule Take 20 mg by mouth daily.    [provider]  tamoxifen  (NOLVADEX ) 20 MG tablet Take 1 tablet (20 mg total) by mouth daily. 04/10/24   Burton, [name] K, NP  UNABLE TO FIND Take by mouth daily. Med  Name: WEEM gummies   two PO daily    [provider]  valACYclovir  (VALTREX ) 500 MG tablet Take 1 tablet (500 mg total) by mouth 2 (two) times daily as needed. 02/12/20   Cleatus Arlyss RAMAN, MD  Vitamin B Complex-C CAPS Take 1 capsule by mouth.    [provider]    Family History    Family History  Problem Relation Age of Onset   Dementia Maternal Grandmother    Heart disease Maternal Grandfather    Colon cancer Maternal Grandfather        dx over 82  Colon cancer Father        d. 28   Heart disease Mother    Colon cancer Mother        dx late 34s-70s   Rectal cancer Mother 75   Diabetes Brother    Colon polyps Brother    Diabetes Brother    Colon polyps Brother    Lymphoma Maternal Aunt    Heart disease Maternal Uncle    Crohn's disease Daughter    Esophageal cancer Neg Hx    Stomach cancer Neg Hx    Breast cancer Neg Hx    She indicated that her mother is alive. She indicated that her father is deceased. She indicated that both of her brothers are alive. She indicated that her maternal grandmother is deceased. She indicated that her maternal grandfather is deceased. She indicated that her daughter is alive. She indicated that her maternal aunt is deceased. She indicated that her maternal uncle is deceased. She indicated that the status of her neg hx is unknown.  Social History    Social History   Socioeconomic History   Marital status: Married    Spouse name: Elsie Platts   Number of children: 2   Years of education: some college   Highest education level: Some college, no degree  Occupational History   Occupation: Airline pilot  Tobacco Use   Smoking status: Former    Current packs/day: 0.00    Average packs/day: 0.5 packs/day for 10.0 years (5.0 ttl pk-yrs)    Types: Cigarettes    Start date: 12/15/1983    Quit date: 12/14/1993    Years since quitting: 30.6   Smokeless tobacco: Never  Vaping Use   Vaping status: Never Used  Substance and Sexual  Activity   Alcohol use: Yes    Comment: very rare   Drug use: No   Sexual activity: Yes    Comment: hyst  Other Topics Concern   Not on file  Social History Narrative   Lives at home with husband.   Married 1988.   Right-handed.   Social Drivers of Corporate investment banker Strain: Low Risk  (05/21/2024)   Overall Financial Resource Strain (CARDIA)    Difficulty of Paying Living Expenses: Not hard at all  Food Insecurity: No Food Insecurity (05/21/2024)   Hunger Vital Sign    Worried About Running Out of Food in the Last Year: Never true    Ran Out of Food in the Last Year: Never true  Transportation Needs: No Transportation Needs (05/21/2024)   PRAPARE - Administrator, Civil Service (Medical): No    Lack of Transportation (Non-Medical): No  Physical Activity: Sufficiently Active (05/21/2024)   Exercise Vital Sign    Days of Exercise per Week: 3 days    Minutes of Exercise per Session: 60 min  Stress: No Stress Concern Present (05/21/2024)   [employer] of Occupational Health - Occupational Stress Questionnaire    Feeling of Stress : Not at all  Social Connections: Socially Integrated (05/21/2024)   Social Connection and Isolation Panel    Frequency of Communication with Friends and Family: More than three times a week    Frequency of Social Gatherings with Friends and Family: Once a week    Attends Religious Services: 1 to 4 times per year    Active Member of [employer] or Organizations: Yes    Attends Engineer, structural: More than 4 times per year    Marital Status: Married  Intimate Partner Violence: Not At Risk (05/25/2024)   Humiliation, Afraid, Rape, and Kick questionnaire    Fear of Current or Ex-Partner: No    Emotionally Abused: No    Physically Abused: No    Sexually Abused: No     Review of Systems    General:  No chills, fever, night sweats or weight changes.  Cardiovascular:  No chest pain, dyspnea on exertion, edema, orthopnea,  palpitations, paroxysmal nocturnal dyspnea. Dermatological: No rash, lesions/masses Respiratory: No cough, dyspnea Urologic: No hematuria, dysuria Abdominal:   No nausea, vomiting, diarrhea, bright red blood per rectum, melena, or hematemesis Neurologic:  No visual changes, wkns, changes in mental status. All other systems reviewed and are otherwise negative except as noted above.  Physical Exam    VS:  BP 130/78   Pulse 72   Ht 5' 7 (1.702 m)   Wt 180 lb (81.6 kg)   SpO2 100%   BMI 28.19 kg/m  , BMI Body mass index is 28.19 kg/m. GEN: Well nourished, well developed, in no acute distress. HEENT: normal. Neck: Supple, no JVD, carotid bruits, or masses. Cardiac: RRR, no murmurs, rubs, or gallops. No clubbing, cyanosis, edema.  Radials/DP/PT 2+ and equal bilaterally.  Respiratory:  Respirations regular and unlabored, clear to auscultation bilaterally. GI: Soft, nontender, nondistended, BS + x 4. MS: no deformity or atrophy. Skin: warm and dry, no rash. Neuro:  Strength and sensation are intact. Psych: Normal affect.  Accessory Clinical Findings    Recent Labs: 04/10/2024: ALT 37; BUN 21; Creatinine 0.85; Hemoglobin 13.8; Platelet Count 152; Potassium 4.5; Sodium 141   Recent Lipid Panel    Component Value Date/Time   CHOL 151 12/17/2022 1225   TRIG 66 12/17/2022 1225   HDL 64 12/17/2022 1225   CHOLHDL 4 02/12/2020 1704   VLDL 17.4 02/12/2020 1704   LDLCALC 74 12/17/2022 1225         ECG personally reviewed by me today- EKG Interpretation Date/Time:  Monday August 07 2024 09:37:26 EDT Ventricular Rate:  72 PR Interval:  164 QRS Duration:  110 QT Interval:  402 QTC Calculation: 440 R Axis:   59  Text Interpretation: Normal sinus rhythm Normal ECG When compared with ECG of 24-Jan-2024 16:06, No significant change was found Confirmed by Emelia Hazy [phone]) on 08/07/2024 9:55:04 AM    Cardiac event monitor on 01/24/2024   6 triggered event for sinus rhythm,  minimal run of SVT.  No significant tachyarrhythmia or bradyarrhythmia. No atrial fibrillation or flutter.     Patch Wear Time:  6 days and 23 hours (2025-02-14T09:18:07-499 to 2025-02-21T08:27:34-0500)   Patient had a min HR of 43 bpm, max HR of 185 bpm, and avg HR of 70 bpm. Predominant underlying rhythm was Sinus Rhythm. 12 Supraventricular Tachycardia runs occurred, the run with the fastest interval lasting 16 beats with a max rate of 185 bpm, the  longest lasting 9.8 secs with an avg rate of 142 bpm. Supraventricular Tachycardia was detected within +/- 45 seconds of symptomatic patient event(s). Isolated SVEs were rare (<1.0%), SVE Couplets were rare (<1.0%), and SVE Triplets were rare (<1.0%).  Isolated VEs were rare (<1.0%), VE Couplets were rare (<1.0%), and no VE Triplets were present.     Assessment & Plan   1.  SVT, palpitations-reports more frequent episodes.  She notices chest pressure and throat pressure with the episodes.  They are brief.  Cardiac event monitor showed brief episodes of SVT. Avoid triggers caffeine, chocolate, EtOH, dehydration etc. Vagal  maneuvers reviewed Repeat cardiac event monitor, CBC, BMP, magnesium Order echocardiogram  Hypertension-BP today 130/78. Maintain blood pressure log Heart healthy low-sodium diet Increase physical activity as tolerated  Coronary artery disease- Previously noted to have CAC on CT. High-fiber diet Increase physical activity as tolerated-goal 150 minutes of moderate physical activity per week. Continue atorvastatin   Breast CA-she had seed radiation of left upper quadrant breast CA. Continue tamoxifen  Follows with oncology  Disposition: Follow-up with Dr. Loni or me in 6 weeks.   Josefa HERO. Meggie Laseter NP-C     08/07/2024, 10:17 AM [facility] [address] 5[address] [place], [place] [address] Office [phone]      I spent 14 minutes examining this patient, reviewing medications,  and using patient centered shared decision making involving their cardiac care.   I spent  20 minutes reviewing past medical history,  medications, and prior cardiac tests.

## 2024-08-07 ENCOUNTER — Encounter: Payer: Self-pay | Admitting: General Practice

## 2024-08-07 ENCOUNTER — Ambulatory Visit: Attending: General Practice | Admitting: General Practice

## 2024-08-07 VITALS — BP 130/78 | HR 72 | Ht 67.0 in | Wt 180.0 lb

## 2024-08-07 DIAGNOSIS — I10 Essential (primary) hypertension: Secondary | ICD-10-CM | POA: Diagnosis present

## 2024-08-07 DIAGNOSIS — I2510 Atherosclerotic heart disease of native coronary artery without angina pectoris: Secondary | ICD-10-CM | POA: Diagnosis not present

## 2024-08-07 DIAGNOSIS — Z170 Estrogen receptor positive status [ER+]: Secondary | ICD-10-CM | POA: Insufficient documentation

## 2024-08-07 DIAGNOSIS — R002 Palpitations: Secondary | ICD-10-CM | POA: Diagnosis not present

## 2024-08-07 DIAGNOSIS — R5382 Chronic fatigue, unspecified: Secondary | ICD-10-CM | POA: Insufficient documentation

## 2024-08-07 DIAGNOSIS — C50212 Malignant neoplasm of upper-inner quadrant of left female breast: Secondary | ICD-10-CM | POA: Insufficient documentation

## 2024-08-07 DIAGNOSIS — I471 Supraventricular tachycardia, unspecified: Secondary | ICD-10-CM | POA: Diagnosis present

## 2024-08-07 NOTE — Patient Instructions (Addendum)
 Medication Instructions:  Continue  current medications *If you need a refill on your cardiac medications before your next appointment, please call your pharmacy*  Lab Work: Bmet, mg, cbc today If you have labs (blood work) drawn today and your tests are completely normal, you will receive your results only by: MyChart Message (if you have MyChart) OR A paper copy in the mail If you have any lab test that is abnormal or we need to change your treatment, we will call you to review the results.  Testing/Procedures: Echo  Your physician has requested that you have an echocardiogram. Echocardiography is a painless test that uses sound waves to create images of your heart. It provides your doctor with information about the size and shape of your heart and how well your heart's chambers and valves are working. This procedure takes approximately one hour. There are no restrictions for this procedure. Please do NOT wear cologne, perfume, aftershave, or lotions (deodorant is allowed). Please arrive 15 minutes prior to your appointment time.  Please note: We ask at that you not bring children with you during ultrasound (echo/ vascular) testing. Due to room size and safety concerns, children are not allowed in the ultrasound rooms during exams. Our front office staff cannot provide observation of children in our lobby area while testing is being conducted. An adult accompanying a patient to their appointment will only be allowed in the ultrasound room at the discretion of the ultrasound technician under special circumstances. We apologize for any inconvenience.   Follow-Up: At [facility], you and your health needs are our priority.  As part of our continuing mission to provide you with exceptional heart care, our providers are all part of one team.  This team includes your primary Cardiologist (physician) and Advanced Practice Providers or APPs (Physician Assistants and Nurse Practitioners) who  all work together to provide you with the care you need, when you need it.  Your next appointment:   6 weeks  Provider:   Josefa Cleaver/ Archarya  We recommend signing up for the patient portal called MyChart.  Sign up information is provided on this After Visit Summary.  MyChart is used to connect with patients for Virtual Visits (Telemedicine).  Patients are able to view lab/test results, encounter notes, upcoming appointments, etc.  Non-urgent messages can be sent to your provider as well.   To learn more about what you can do with MyChart, go to [email or web address].   Other Instructions Zio  ZIO XT- Long Term Monitor Instructions  Your physician has requested you wear a ZIO patch monitor for 7 days.  This is a single patch monitor. [employer] supplies one patch monitor per enrollment. Additional stickers are not available. Please do not apply patch if you will be having a Nuclear Stress Test,  Echocardiogram, Cardiac CT, MRI, or Chest Xray during the period you would be wearing the  monitor. The patch cannot be worn during these tests. You cannot remove and re-apply the  ZIO XT patch monitor.  Your ZIO patch monitor will be mailed 3 day USPS to your address on file. It may take 3-5 days  to receive your monitor after you have been enrolled.  Once you have received your monitor, please review the enclosed instructions. Your monitor  has already been registered assigning a specific monitor serial # to you.  Billing and Patient Assistance Program Information  We have supplied [employer] with any of your insurance information on file for billing purposes. [employer] offers a  sliding scale Patient Assistance Program for patients that do not have  insurance, or whose insurance does not completely cover the cost of the ZIO monitor.  You must apply for the Patient Assistance Program to qualify for this discounted rate.  To apply, please call [employer] at [phone], select option 4, select  option 2, ask to apply for  Patient Assistance Program. Meredeth will ask your household income, and how many people  are in your household. They will quote your out-of-pocket cost based on that information.  [employer] will also be able to set up a 62-month, interest-free payment plan if needed.  Applying the monitor   Shave hair from upper left chest.  Hold abrader disc by orange tab. Rub abrader in 40 strokes over the upper left chest as  indicated in your monitor instructions.  Clean area with 4 enclosed alcohol pads. Let dry.  Apply patch as indicated in monitor instructions. Patch will be placed under collarbone on left  side of chest with arrow pointing upward.  Rub patch adhesive wings for 2 minutes. Remove white label marked 1. Remove the white  label marked 2. Rub patch adhesive wings for 2 additional minutes.  While looking in a mirror, press and release button in center of patch. A small green light will  flash 3-4 times. This will be your only indicator that the monitor has been turned on.  Do not shower for the first 24 hours. You may shower after the first 24 hours.  Press the button if you feel a symptom. You will hear a small click. Record Date, Time and  Symptom in the Patient Logbook.  When you are ready to remove the patch, follow instructions on the last 2 pages of Patient  Logbook. Stick patch monitor onto the last page of Patient Logbook.  Place Patient Logbook in the blue and white box. Use locking tab on box and tape box closed  securely. The blue and white box has prepaid postage on it. Please place it in the mailbox as  soon as possible. Your physician should have your test results approximately 7 days after the  monitor has been mailed back to [facility].  Call [facility] Customer Care at [phone] if you have questions regarding  your ZIO XT patch monitor. Call them immediately if you see an orange light blinking on your  monitor.  If your monitor  falls off in less than 4 days, contact our Monitor department at [phone].  If your monitor becomes loose or falls off after 4 days call [employer] at [phone] for  suggestions on securing your monitor

## 2024-08-08 ENCOUNTER — Ambulatory Visit: Payer: Self-pay | Admitting: General Practice

## 2024-08-08 LAB — BASIC METABOLIC PANEL WITH GFR
BUN/Creatinine Ratio: 23 (ref 12–28)
BUN: 18 mg/dL (ref 8–27)
CO2: 24 mmol/L (ref 20–29)
Calcium: 9.5 mg/dL (ref 8.7–10.3)
Chloride: 106 mmol/L (ref 96–106)
Creatinine, Ser: 0.77 mg/dL (ref 0.57–1.00)
Glucose: 94 mg/dL (ref 70–99)
Potassium: 4.9 mmol/L (ref 3.5–5.2)
Sodium: 142 mmol/L (ref 134–144)
eGFR: 84 mL/min/1.73 (ref >59)

## 2024-08-08 LAB — CBC
Hematocrit: 43.6 % (ref 34.0–46.6)
Hemoglobin: 14 g/dL (ref 11.1–15.9)
MCH: 30.2 pg (ref 26.6–33.0)
MCHC: 32.1 g/dL (ref 31.5–35.7)
MCV: 94 fL (ref 79–97)
Platelets: 196 x10E3/uL (ref 150–450)
RBC: 4.64 x10E6/uL (ref 3.77–5.28)
RDW: 11.8 % (ref 11.7–15.4)
WBC: 5.8 x10E3/uL (ref 3.4–10.8)

## 2024-08-08 LAB — MAGNESIUM: Magnesium: 2 mg/dL (ref 1.6–2.3)

## 2024-09-04 ENCOUNTER — Ambulatory Visit (HOSPITAL_COMMUNITY)
Admission: RE | Admit: 2024-09-04 | Discharge: 2024-09-04 | Disposition: A | Discharge status: Home / Self Care | Source: Ambulatory Visit | Attending: Cardiology | Admitting: Cardiology

## 2024-09-04 DIAGNOSIS — I2510 Atherosclerotic heart disease of native coronary artery without angina pectoris: Secondary | ICD-10-CM | POA: Insufficient documentation

## 2024-09-04 DIAGNOSIS — R002 Palpitations: Secondary | ICD-10-CM | POA: Diagnosis present

## 2024-09-04 DIAGNOSIS — R5382 Chronic fatigue, unspecified: Secondary | ICD-10-CM | POA: Diagnosis present

## 2024-09-04 DIAGNOSIS — I471 Supraventricular tachycardia, unspecified: Secondary | ICD-10-CM | POA: Diagnosis present

## 2024-09-04 DIAGNOSIS — I10 Essential (primary) hypertension: Secondary | ICD-10-CM | POA: Diagnosis present

## 2024-09-04 LAB — ECHOCARDIOGRAM COMPLETE
AR max vel: 3.86 cm2
AV Area VTI: 3.62 cm2
AV Area mean vel: 3.52 cm2
AV Mean grad: 3 mmHg
AV Peak grad: 6.1 mmHg
Ao pk vel: 1.23 m/s
Area-P 1/2: 3.08 cm2
S' Lateral: 2.9 cm

## 2024-09-05 NOTE — Progress Notes (Signed)
 Spoke with patient regarding her recent echocardiogram results. Patient voiced understanding. No other questions or concerns.

## 2024-09-13 NOTE — Progress Notes (Unsigned)
 Cardiology Clinic Note   Patient Name: Aimee Mcclain Date of Encounter: 09/15/2024  Primary Care Provider:  Cleatus Arlyss RAMAN, MD Primary Cardiologist:  Alvan Ronal BRAVO, MD (Inactive)  Patient Profile    Aimee Mcclain 68 year old female presents to the clinic today for follow-up evaluation of her hypertension and palpitations.  Past Medical History    Past Medical History:  Diagnosis Date   Arthritis    Breast cancer (HCC)    Cataract    Colon polyps    Endometriosis    Family history of colon cancer    Family history of colonic polyps    Fuchs' corneal dystrophy    GERD (gastroesophageal reflux disease)    H/O bilateral breast implants    Heartburn    High blood pressure    History of colon polyps    HPV in female    HSV (herpes simplex virus) infection    Hyperlipidemia    Migraine    no longer having migraines   Osteoarthritis    Osteopenia    Osteopenia    Port-A-Cath in place 03/18/2021   S/P TAH (total abdominal hysterectomy) 1991   Vitamin D  deficiency    Past Surgical History:  Procedure Laterality Date   ABDOMINAL HYSTERECTOMY     BREAST BIOPSY     Benign.   BREAST ENHANCEMENT SURGERY     BREAST LUMPECTOMY Bilateral 1993 and 1995   BREAST LUMPECTOMY WITH RADIOACTIVE SEED AND SENTINEL LYMPH NODE BIOPSY Left 02/13/2021   Procedure: LEFT BREAST LUMPECTOMY WITH RADIOACTIVE SEED AND SENTINEL LYMPH NODE BIOPSY WITH SEED TARGETED NODE;  Surgeon: Aron Shoulders, MD;  Location: [facility];  Service: General;  Laterality: Left;   CATARACT EXTRACTION, BILATERAL     CESAREAN SECTION     PARTIAL HYSTERECTOMY     PLACEMENT OF BREAST IMPLANTS  1993   PORT-A-CATH REMOVAL Right 11/04/2021   Procedure: REMOVAL PORT-A-CATH;  Surgeon: Aron Shoulders, MD;  Location: [facility];  Service: General;  Laterality: Right;   PORTACATH PLACEMENT Right 02/13/2021   Procedure: INSERTION PORT-A-CATH;  Surgeon: Aron Shoulders, MD;  Location: [facility];  Service: General;  Laterality: Right;   TONSILLECTOMY     WISDOM TOOTH EXTRACTION      Allergies  Allergies  Allergen Reactions   Ibuprofen Swelling    She has tolerated aleve   Omnicef [Cefdinir] Itching   Proton Pump Inhibitors     Headache, but Can tolerate prilosec   Roxicodone  [Oxycodone  Hcl] Rash    Developed rash on face and chest   Vioxx [Rofecoxib] Swelling and Rash    History of Present Illness    Aimee Mcclain has a PMH of breast cancer, migraines, palpitations, and hypertension.  Her CT showed mild CAC.  Her 10-year ASCVD score was previously calculated at 5.6%.  Her LDL was previously noted to be greater than 100 and she was started on atorvastatin .  Echocardiogram 3/22 showed an EF of 60 to 65%, strain -22, normal RV and no valvular disease.  Echocardiogram 03/20/2022 showed GLS-22.  Her EKG 01/13/2023 showed normal sinus rhythm.  She was previously seen and evaluated by Dr. Ronal Alvan on 01/24/2024.  During that time she noted that when she would exert herself at the gym she would feel like she may pass out and feel dizzy.  She felt fine with normal daily activities.  She was able to carry groceries and go up stairs without significant dyspnea on exertion or  chest discomfort.  She did note mild dyspnea.  It was felt that her dyspnea was related to deconditioning.  Her atorvastatin  20 mg daily was continued.  A 7-day cardiac event monitor was ordered and showed a minimum heart rate of 43, maximum heart rate of 185, average heart rate of 70 bpm.  She was prominently in sinus rhythm.  She was noted to have 12 episodes of SVT with the fastest interval lasting 16 beats and the max heart rate during these episodes was 185.  The longest episode lasted for 9.8 seconds and had an average heart rate of 142 bpm.  She reported some leg pain which improved when she was changed to tamoxifen .  She denied history of cardiac disease.  Her mother has atrial fibrillation.  Her maternal  grandfather and uncle had heart disease.  Her father died of MI at age 68.  She smoked for about 10 years.  She presented to the clinic 08/07/24 for follow-up evaluation and stated she had started feeling worse over the prior 2 weeks.  She noticed pressure in her chest and throat.  She felt that she was having more episodes similar to what she had before.  She describes episodes as short-lived but noticed dizzy sensation.  We reviewed her previous event monitor.  She expressed understanding.  She did note some blood in her stool which she attributed to hemorrhoids.  She described it as bright red.  Her blood pressure was well-controlled.  Her heart rate and EKG were reassuring.  Due to increased episodes I  ordered echocardiogram, CBC, BMP, magnesium, and planned repeat cardiac event monitor.   Follow-up in around 6 weeks was planned.  She noted that she did try to maintain her hydration and regularly carried  water.  She had been trying to reduce her diet drinks.  She did report drinking coffee in the mornings.  She does not drink alcohol.  Her echocardiogram 09/04/2024 showed LVEF of 60 to 65%, mild mitral valve regurgitation, and no significant changes from her previous study.  She contacted nurse triage line on 08/22/2024.  She reported that she had not received cardiac event monitor at that time.  Her lab work from 08/07/2024 showed stable renal function and electrolytes.  Her CBC was within normal limits.  She presents to the clinic today for follow-up evaluation and states she is feeling fine.  She reports that she did not receive her cardiac event monitor.  She continues to notice occasional episodes of palpitations which she describes as waves.  We reviewed her echocardiogram and lab work.  These were both reassuring.  She expressed understanding.  I also reviewed her cardiac monitor from February.  At this time she does not feel that she is having as many episodes.  Her energy level is stable and she  continues to go to the gym 3 times per week.  Her main complaint is with her neuropathy.  She noted this post cancer treatment.  At this time I will defer repeat cardiac monitor.  I instructed her that if she notices increased symptoms and palpitations we will plan to order cardiac monitor.  I will continue her current medication regimen and plan follow-up in 9 to 12 months.  Today she denies chest pain, shortness of breath, and lower extremity edema.    Home Medications    Prior to Admission medications   Medication Sig Start Date End Date Taking? Authorizing Provider  acetaminophen  (TYLENOL ) 500 MG tablet Take 1,000 mg by mouth  every 6 (six) hours as needed for moderate pain (pain score 4-6).    [provider]  atorvastatin  (LIPITOR) 20 MG tablet TAKE 1 TABLET BY MOUTH EVERY DAY 01/31/24   Alvan Ronal BRAVO, MD  Calcium  Carbonate-Vitamin D  (CALCIUM  600+D PO) Take 1 tablet by mouth daily.    [provider]  Cholecalciferol (VITAMIN D3) 125 MCG (5000 UT) CAPS Take 1 capsule (5,000 Units total) by mouth daily. 01/14/23   Berkeley Adelita PENNER, MD  omeprazole  (PRILOSEC) 20 MG capsule Take 20 mg by mouth daily.    [provider]  tamoxifen  (NOLVADEX ) 20 MG tablet Take 1 tablet (20 mg total) by mouth daily. 04/10/24   Burton, [name] K, NP  UNABLE TO FIND Take by mouth daily. Med Name: WEEM gummies   two PO daily    [provider]  valACYclovir  (VALTREX ) 500 MG tablet Take 1 tablet (500 mg total) by mouth 2 (two) times daily as needed. 02/12/20   Cleatus Arlyss RAMAN, MD  Vitamin B Complex-C CAPS Take 1 capsule by mouth.    [provider]    Family History    Family History  Problem Relation Age of Onset   Dementia Maternal Grandmother    Heart disease Maternal Grandfather    Colon cancer Maternal Grandfather        dx over 56   Colon cancer Father        d. 86   Heart disease Mother    Colon cancer Mother        dx late 41s-70s   Rectal cancer Mother  50   Diabetes Brother    Colon polyps Brother    Diabetes Brother    Colon polyps Brother    Lymphoma Maternal Aunt    Heart disease Maternal Uncle    Crohn's disease Daughter    Esophageal cancer Neg Hx    Stomach cancer Neg Hx    Breast cancer Neg Hx    She indicated that her mother is alive. She indicated that her father is deceased. She indicated that both of her brothers are alive. She indicated that her maternal grandmother is deceased. She indicated that her maternal grandfather is deceased. She indicated that her daughter is alive. She indicated that her maternal aunt is deceased. She indicated that her maternal uncle is deceased. She indicated that the status of her neg hx is unknown.  Social History    Social History   Socioeconomic History   Marital status: Married    Spouse name: Elsie Cannella   Number of children: 2   Years of education: some college   Highest education level: Some college, no degree  Occupational History   Occupation: Airline pilot  Tobacco Use   Smoking status: Former    Current packs/day: 0.00    Average packs/day: 0.5 packs/day for 10.0 years (5.0 ttl pk-yrs)    Types: Cigarettes    Start date: 12/15/1983    Quit date: 12/14/1993    Years since quitting: 30.7   Smokeless tobacco: Never  Vaping Use   Vaping status: Never Used  Substance and Sexual Activity   Alcohol use: Yes    Comment: very rare   Drug use: No   Sexual activity: Yes    Comment: hyst  Other Topics Concern   Not on file  Social History Narrative   Lives at home with husband.   Married 1988.   Right-handed.   Social Drivers of Corporate investment banker Strain: Low  Risk  (05/21/2024)   Overall Financial Resource Strain (CARDIA)    Difficulty of Paying Living Expenses: Not hard at all  Food Insecurity: No Food Insecurity (05/21/2024)   Hunger Vital Sign    Worried About Running Out of Food in the Last Year: Never true    Ran Out of Food in the Last Year: Never true   Transportation Needs: No Transportation Needs (05/21/2024)   PRAPARE - Administrator, Civil Service (Medical): No    Lack of Transportation (Non-Medical): No  Physical Activity: Sufficiently Active (05/21/2024)   Exercise Vital Sign    Days of Exercise per Week: 3 days    Minutes of Exercise per Session: 60 min  Stress: No Stress Concern Present (05/21/2024)   [employer] of Occupational Health - Occupational Stress Questionnaire    Feeling of Stress : Not at all  Social Connections: Socially Integrated (05/21/2024)   Social Connection and Isolation Panel    Frequency of Communication with Friends and Family: More than three times a week    Frequency of Social Gatherings with Friends and Family: Once a week    Attends Religious Services: 1 to 4 times per year    Active Member of [employer] or Organizations: Yes    Attends Engineer, structural: More than 4 times per year    Marital Status: Married  Catering manager Violence: Not At Risk (05/25/2024)   Humiliation, Afraid, Rape, and Kick questionnaire    Fear of Current or Ex-Partner: No    Emotionally Abused: No    Physically Abused: No    Sexually Abused: No     Review of Systems    General:  No chills, fever, night sweats or weight changes.  Cardiovascular:  No chest pain, dyspnea on exertion, edema, orthopnea, palpitations, paroxysmal nocturnal dyspnea. Dermatological: No rash, lesions/masses Respiratory: No cough, dyspnea Urologic: No hematuria, dysuria Abdominal:   No nausea, vomiting, diarrhea, bright red blood per rectum, melena, or hematemesis Neurologic:  No visual changes, wkns, changes in mental status. All other systems reviewed and are otherwise negative except as noted above.  Physical Exam    VS:  BP 112/74   Pulse 63   Ht 5' 7 (1.702 m)   Wt 182 lb (82.6 kg)   SpO2 98%   BMI 28.51 kg/m  , BMI Body mass index is 28.51 kg/m. GEN: Well nourished, well developed, in no acute distress. HEENT:  normal. Neck: Supple, no JVD, carotid bruits, or masses. Cardiac: RRR, no murmurs, rubs, or gallops. No clubbing, cyanosis, edema.  Radials/DP/PT 2+ and equal bilaterally.  Respiratory:  Respirations regular and unlabored, clear to auscultation bilaterally. GI: Soft, nontender, nondistended, BS + x 4. MS: no deformity or atrophy. Skin: warm and dry, no rash. Neuro:  Strength and sensation are intact. Psych: Normal affect.  Accessory Clinical Findings    Recent Labs: 04/10/2024: ALT 37 08/07/2024: BUN 18; Creatinine, Ser 0.77; Hemoglobin 14.0; Magnesium 2.0; Platelets 196; Potassium 4.9; Sodium 142   Recent Lipid Panel    Component Value Date/Time   CHOL 151 12/17/2022 1225   TRIG 66 12/17/2022 1225   HDL 64 12/17/2022 1225   CHOLHDL 4 02/12/2020 1704   VLDL 17.4 02/12/2020 1704   LDLCALC 74 12/17/2022 1225         ECG personally reviewed by me today-   none today.   Cardiac event monitor on 01/24/2024   6 triggered event for sinus rhythm, minimal run of SVT.  No significant  tachyarrhythmia or bradyarrhythmia. No atrial fibrillation or flutter.     Patch Wear Time:  6 days and 23 hours (2025-02-14T09:18:07-499 to 2025-02-21T08:27:34-0500)   Patient had a min HR of 43 bpm, max HR of 185 bpm, and avg HR of 70 bpm. Predominant underlying rhythm was Sinus Rhythm. 12 Supraventricular Tachycardia runs occurred, the run with the fastest interval lasting 16 beats with a max rate of 185 bpm, the  longest lasting 9.8 secs with an avg rate of 142 bpm. Supraventricular Tachycardia was detected within +/- 45 seconds of symptomatic patient event(s). Isolated SVEs were rare (<1.0%), SVE Couplets were rare (<1.0%), and SVE Triplets were rare (<1.0%).  Isolated VEs were rare (<1.0%), VE Couplets were rare (<1.0%), and no VE Triplets were present.    Echocardiogram 09/04/2024  IMPRESSIONS     1. Left ventricular ejection fraction, by estimation, is 60 to 65%. The  left ventricle has  normal function. The left ventricle has no regional  wall motion abnormalities. There is mild left ventricular hypertrophy.  Left ventricular diastolic parameters  were normal. The average left ventricular global longitudinal strain is  -20.5 %. The global longitudinal strain is normal.   2. Right ventricular systolic function is normal. The right ventricular  size is normal.   3. The mitral valve is normal in structure. Mild mitral valve  regurgitation. No evidence of mitral stenosis.   4. The aortic valve is normal in structure. Aortic valve regurgitation is  not visualized. No aortic stenosis is present.   5. The inferior vena cava is normal in size with greater than 50%  respiratory variability, suggesting right atrial pressure of 3 mmHg.   Comparison(s): No significant change from prior study. Prior images  reviewed side by side.   FINDINGS   Left Ventricle: Left ventricular ejection fraction, by estimation, is 60  to 65%. The left ventricle has normal function. The left ventricle has no  regional wall motion abnormalities. The average left ventricular global  longitudinal strain is -20.5 %.  Strain was performed and the global longitudinal strain is normal. The  left ventricular internal cavity size was normal in size. There is mild  left ventricular hypertrophy. Left ventricular diastolic parameters were  normal.   Right Ventricle: The right ventricular size is normal. No increase in  right ventricular wall thickness. Right ventricular systolic function is  normal.   Left Atrium: Left atrial size was normal in size.   Right Atrium: Right atrial size was normal in size.   Pericardium: There is no evidence of pericardial effusion.   Mitral Valve: The mitral valve is normal in structure. Mild mitral valve  regurgitation. No evidence of mitral valve stenosis.   Tricuspid Valve: The tricuspid valve is normal in structure. Tricuspid  valve regurgitation is not demonstrated.  No evidence of tricuspid  stenosis.   Aortic Valve: The aortic valve is normal in structure. Aortic valve  regurgitation is not visualized. No aortic stenosis is present. Aortic  valve mean gradient measures 3.0 mmHg. Aortic valve peak gradient measures  6.1 mmHg. Aortic valve area, by VTI  measures 3.62 cm.   Pulmonic Valve: The pulmonic valve was normal in structure. Pulmonic valve  regurgitation is not visualized. No evidence of pulmonic stenosis.   Aorta: The aortic root is normal in size and structure.   Venous: The inferior vena cava is normal in size with greater than 50%  respiratory variability, suggesting right atrial pressure of 3 mmHg.   IAS/Shunts: No atrial level shunt  detected by color flow Doppler.   Assessment & Plan   1.  SVT, palpitations-echocardiogram reassuring.  Did not receive cardiac event monitor.  Her prior cardiac event monitor showed brief episodes of SVT.  Continues to notice occasional episodes of palpitations that she describes as waves. Avoid triggers caffeine, chocolate, EtOH, dehydration etc. Continue vagal maneuvers   Hypertension-BP today 112/74 Maintain blood pressure log Heart healthy low-sodium diet Increase physical activity as tolerated  Coronary artery disease-denies chest pain today.   She was previously noted to have CAC on CT. High-fiber diet Increase physical activity as tolerated-reviewed. Continue atorvastatin   Breast CA-she previously had seed radiation of left upper quadrant breast CA. Continue tamoxifen  Follows with oncology  Disposition: Follow-up with Dr. Loni or me in 9-12 months.  Josefa HERO. Adric Wrede NP-C     09/15/2024, 8:21 AM [facility] [address] [place], [place] [address] Office [phone]      I spent 14 minutes examining this patient, reviewing medications, and using patient centered shared decision making involving their cardiac care.   I spent  20 minutes  reviewing past medical history,  medications, and prior cardiac tests.

## 2024-09-15 ENCOUNTER — Ambulatory Visit: Attending: General Practice | Admitting: General Practice

## 2024-09-15 ENCOUNTER — Encounter: Payer: Self-pay | Admitting: General Practice

## 2024-09-15 VITALS — BP 112/74 | HR 63 | Ht 67.0 in | Wt 182.0 lb

## 2024-09-15 DIAGNOSIS — I2510 Atherosclerotic heart disease of native coronary artery without angina pectoris: Secondary | ICD-10-CM | POA: Diagnosis not present

## 2024-09-15 DIAGNOSIS — R002 Palpitations: Secondary | ICD-10-CM | POA: Diagnosis not present

## 2024-09-15 DIAGNOSIS — Z170 Estrogen receptor positive status [ER+]: Secondary | ICD-10-CM | POA: Diagnosis present

## 2024-09-15 DIAGNOSIS — I10 Essential (primary) hypertension: Secondary | ICD-10-CM | POA: Diagnosis not present

## 2024-09-15 DIAGNOSIS — I471 Supraventricular tachycardia, unspecified: Secondary | ICD-10-CM | POA: Insufficient documentation

## 2024-09-15 DIAGNOSIS — C50212 Malignant neoplasm of upper-inner quadrant of left female breast: Secondary | ICD-10-CM | POA: Insufficient documentation

## 2024-09-15 NOTE — Patient Instructions (Addendum)
 Medication Instructions:  Your physician recommends that you continue on your current medications as directed. Please refer to the Current Medication list given to you today. *If you need a refill on your cardiac medications before your next appointment, please call your pharmacy*  Lab Work: None ordered If you have labs (blood work) drawn today and your tests are completely normal, you will receive your results only by: MyChart Message (if you have MyChart) OR A paper copy in the mail If you have any lab test that is abnormal or we need to change your treatment, we will call you to review the results.  Testing/Procedures: None ordered  Follow-Up: At [facility], you and your health needs are our priority.  As part of our continuing mission to provide you with exceptional heart care, our providers are all part of one team.  This team includes your primary Cardiologist (physician) and Advanced Practice Providers or APPs (Physician Assistants and Nurse Practitioners) who all work together to provide you with the care you need, when you need it.  Your next appointment:   9-12 month(s)  Provider:   Soyla Merck, MD  We recommend signing up for the patient portal called MyChart.  Sign up information is provided on this After Visit Summary.  MyChart is used to connect with patients for Virtual Visits (Telemedicine).  Patients are able to view lab/test results, encounter notes, upcoming appointments, etc.  Non-urgent messages can be sent to your provider as well.   To learn more about what you can do with MyChart, go to [email or web address].   Other Instructions PLEASE CONTACT THE OFFICE IF YOUR PALPITATIONS INCREASE AND WE WILL CONSIDER REORDERING THE MONITOR.  Avoid caffeine triggers: Alcohol Dehydration Stress  Common Sources of Caffeine to Avoid Coffee (including decaf - small amounts) Tea (especially black, green, oolong) Energy drinks Soft drinks / sodas (cola  and some root beers) Chocolate (dark chocolate has more caffeine) Over-the-counter medications (like Excedrin, Midol, NoDoz) Pre-workout supplements Certain protein bars or energy snacks  Vagal manoeuvres include: 1-Bearing down. Bearing down means that you try to breathe out with your stomach muscles but you don't let air out of your nose or mouth. 2-Putting an ice-cold, wet towel on your face. 3-Coughing or gagging.  4-Valsalva maneuver

## 2024-10-08 NOTE — Assessment & Plan Note (Deleted)
cT1cN1M0 stage Ib, triple positive, pT2N0 stage IA, grade 3 -Diagnosed 12/2020, s/p left lumpectomy and SLNB 02/13/21 by Dr. Donell Beers -Staging work-up showed small indeterminate lung nodules and an indeterminate left parietal bone lesion which will monitor with future scans, otherwise negative for distant metastasis -She completed 10 of 12 doses of adjuvant Taxol/Abraxane and herceptin on 05/21/21, last 2 doses canceled due to her worsening neuropathy. -s/p radiation 06/30/21 - 07/25/21 under Dr. Basilio Cairo. -most recent mammogram on 01/05/22 was benign. -she took exemestane 08/2021 - 01/2022, discontinued due to constipation and increased joint/bone pain. She switched to tamoxifen in 03/2022 and is tolerating better.

## 2024-10-09 ENCOUNTER — Inpatient Hospital Stay: Admitting: Hematology

## 2024-10-09 ENCOUNTER — Encounter: Payer: Self-pay | Admitting: Hematology

## 2024-10-09 ENCOUNTER — Telehealth: Admitting: Family Medicine

## 2024-10-09 ENCOUNTER — Telehealth: Payer: Self-pay

## 2024-10-09 ENCOUNTER — Inpatient Hospital Stay

## 2024-10-09 ENCOUNTER — Ambulatory Visit: Payer: Self-pay

## 2024-10-09 DIAGNOSIS — C50212 Malignant neoplasm of upper-inner quadrant of left female breast: Secondary | ICD-10-CM

## 2024-10-09 DIAGNOSIS — R0789 Other chest pain: Secondary | ICD-10-CM

## 2024-10-09 DIAGNOSIS — R519 Headache, unspecified: Secondary | ICD-10-CM

## 2024-10-09 DIAGNOSIS — J029 Acute pharyngitis, unspecified: Secondary | ICD-10-CM

## 2024-10-09 NOTE — Progress Notes (Signed)
 Virtual Visit Consent   Aimee Mcclain, you are scheduled for a virtual visit with a [facility] provider today. Just as with appointments in the office, your consent must be obtained to participate. Your consent will be active for this visit and any virtual visit you may have with one of our providers in the next 365 days. If you have a MyChart account, a copy of this consent can be sent to you electronically.  As this is a virtual visit, video technology does not allow for your provider to perform a traditional examination. This may limit your provider's ability to fully assess your condition. If your provider identifies any concerns that need to be evaluated in person or the need to arrange testing (such as labs, EKG, etc.), we will make arrangements to do so. Although advances in technology are sophisticated, we cannot ensure that it will always work on either your end or our end. If the connection with a video visit is poor, the visit may have to be switched to a telephone visit. With either a video or telephone visit, we are not always able to ensure that we have a secure connection.  By engaging in this virtual visit, you consent to the provision of healthcare and authorize for your insurance to be billed (if applicable) for the services provided during this visit. Depending on your insurance coverage, you may receive a charge related to this service.  I need to obtain your verbal consent now. Are you willing to proceed with your visit today? Aimee Mcclain has provided verbal consent on 10/09/2024 for a virtual visit (video or telephone). Chiquita CHRISTELLA Barefoot, NP  Date: 10/09/2024 11:31 AM   Virtual Visit via Video Note   I, Chiquita CHRISTELLA Barefoot, connected with  Aimee Mcclain  ([id number], [birth year]) on 10/09/24 at 11:30 AM EDT by a video-enabled telemedicine application and verified that I am speaking with the correct person using two identifiers.  Location: Patient: Virtual Visit Location  Patient: Home Provider: Virtual Visit Location Provider: Home Office   I discussed the limitations of evaluation and management by telemedicine and the availability of in person appointments. The patient expressed understanding and agreed to proceed.    History of Present Illness: Aimee Mcclain is a 68 y.o. who identifies as a female who was assigned female at birth, and is being seen today for sore throat  Onset was a week ago- with a bad sore throat that last several days- felt better after drinking warm fluids.  Associated symptoms are headache in back and front area of head, pains in head when moving around. Reports a heavy sensation at base of throat and upper chest. Does not feel like her Heart conditions are causing this.  Modifying factors are fluids and meds for headache Denies chest pain, shortness of breath, fevers, chills  Exposure to sick contacts- not that she is aware.  No recent travel Vaccines: none  BP 144/85 (consistent with normal BP)  HR 73 Oxygen Sat 98 Covid and flu test neg this morning.  Problems:  Patient Active Problem List   Diagnosis Date Noted   SUI (stress urinary incontinence, female) 04/26/2024   Chronic idiopathic constipation 04/26/2024   Urinary frequency 04/26/2024   Elevated blood pressure reading without diagnosis of hypertension 11/09/2023   Non-alcoholic fatty liver disease 06/24/2022   Endometriosis 06/24/2022   COVID-19 virus infection 05/28/2022   Paronychia of great toe, right 01/16/2022   Pain of right great toe 01/16/2022  Loose toenail 01/16/2022   Leukopenia 12/17/2021   Exposure to COVID-19 virus 12/17/2021   HTN (hypertension) 06/22/2021   Herpes simplex 06/19/2021   Osteopenia 06/19/2021   Medicare welcome exam 05/21/2021   Malignant neoplasm of upper-inner quadrant of left breast in female, 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   Chronic migraine 08/21/2020   Screening mammogram, encounter for 06/30/2020    Paresthesia 06/09/2020   Neuropathy 02/14/2020   Atypical chest pain 02/14/2020   Healthcare maintenance 02/14/2020   Migraine    Advance care planning 01/16/2019   Family history of colon cancer     Allergies:  Allergies  Allergen Reactions   Ibuprofen Swelling    She has tolerated aleve   Omnicef [Cefdinir] Itching   Proton Pump Inhibitors     Headache, but Can tolerate prilosec   Roxicodone  [Oxycodone  Hcl] Rash    Developed rash on face and chest   Vioxx [Rofecoxib] Swelling and Rash   Medications:  Current Outpatient Medications:    atorvastatin  (LIPITOR) 20 MG tablet, TAKE 1 TABLET BY MOUTH EVERY DAY, Disp: 90 tablet, Rfl: 3   Calcium  Carbonate-Vitamin D  (CALCIUM  600+D PO), Take 1 tablet by mouth daily., Disp: , Rfl:    Cholecalciferol (VITAMIN D3) 125 MCG (5000 UT) CAPS, Take 1 capsule (5,000 Units total) by mouth daily., Disp: 30 capsule, Rfl: 0   omeprazole  (PRILOSEC) 20 MG capsule, Take 20 mg by mouth daily., Disp: , Rfl:    tamoxifen  (NOLVADEX ) 20 MG tablet, Take 1 tablet (20 mg total) by mouth daily., Disp: 90 tablet, Rfl: 3   UNABLE TO FIND, Take by mouth daily. Med Name: WEEM gummies   two PO daily, Disp: , Rfl:    valACYclovir  (VALTREX ) 500 MG tablet, Take 1 tablet (500 mg total) by mouth 2 (two) times daily as needed., Disp: , Rfl:    Vitamin B Complex-C CAPS, Take 1 capsule by mouth., Disp: , Rfl:   Observations/Objective: Patient is well-developed, well-nourished in no acute distress.  Resting comfortably at home.  Head is normocephalic, atraumatic.  No labored breathing.  Speech is clear and coherent with logical content.  Patient is alert and oriented at baseline.    Assessment and Plan:  1. Chest discomfort (Primary) -more of a heavy sensation- no red flags outside this, still recommend in person   2. Sore throat -improved   3. Nonintractable headache, unspecified chronicity pattern, unspecified headache type Improved- but thinks it is trying to  come back   -given heart history and neg covid test- advised in person for follow up  -reports no SVT or changes in HR that she notes in the past and vitals are in normal range for her. -could be viral of some type, but limited URI symptoms    Patient acknowledged agreement and understanding of the plan.   Past Medical, Surgical, Social History, Allergies, and Medications have been Reviewed.    Follow Up Instructions: I discussed the assessment and treatment plan with the patient. The patient was provided an opportunity to ask questions and all were answered. The patient agreed with the plan and demonstrated an understanding of the instructions.  A copy of instructions were sent to the patient via MyChart unless otherwise noted below.    The patient was advised to call back or seek an in-person evaluation if the symptoms worsen or if the condition fails to improve as anticipated.    Chiquita CHRISTELLA Barefoot, NP

## 2024-10-09 NOTE — Patient Instructions (Signed)
  Rihanna R Fors, thank you for joining Chiquita CHRISTELLA Barefoot, NP for today's virtual visit.  While this provider is not your primary care provider (PCP), if your PCP is located in our provider database this encounter information will be shared with them immediately following your visit.   A [facility] MyChart account gives you access to today's visit and all your visits, tests, and labs performed at [facility]  click here if you don't have a [facility] MyChart account or go to [email or web address]  Consent: (Patient) Aimee Mcclain provided verbal consent for this virtual visit at the beginning of the encounter.  Current Medications:  Current Outpatient Medications:    atorvastatin  (LIPITOR) 20 MG tablet, TAKE 1 TABLET BY MOUTH EVERY DAY, Disp: 90 tablet, Rfl: 3   Calcium  Carbonate-Vitamin D  (CALCIUM  600+D PO), Take 1 tablet by mouth daily., Disp: , Rfl:    Cholecalciferol (VITAMIN D3) 125 MCG (5000 UT) CAPS, Take 1 capsule (5,000 Units total) by mouth daily., Disp: 30 capsule, Rfl: 0   omeprazole  (PRILOSEC) 20 MG capsule, Take 20 mg by mouth daily., Disp: , Rfl:    tamoxifen  (NOLVADEX ) 20 MG tablet, Take 1 tablet (20 mg total) by mouth daily., Disp: 90 tablet, Rfl: 3   UNABLE TO FIND, Take by mouth daily. Med Name: WEEM gummies   two PO daily, Disp: , Rfl:    valACYclovir  (VALTREX ) 500 MG tablet, Take 1 tablet (500 mg total) by mouth 2 (two) times daily as needed., Disp: , Rfl:    Vitamin B Complex-C CAPS, Take 1 capsule by mouth., Disp: , Rfl:    Medications ordered in this encounter:  No orders of the defined types were placed in this encounter.    *If you need refills on other medications prior to your next appointment, please contact your pharmacy*  Follow-Up: Call back or seek an in-person evaluation if the symptoms worsen or if the condition fails to improve as anticipated.  [facility] [phone]  Other Instructions  Please follow up in  person  If you have been instructed to have an in-person evaluation today at a local Urgent Care facility, please use the link below. It will take you to a list of all of our available [facility], including address, phone number and hours of operation. Please do not delay care.  [facility]  If you or a family member do not have a primary care provider, use the link below to schedule a visit and establish care. When you choose a [facility] primary care physician or advanced practice provider, you gain a long-term partner in health. Find a Primary Care Provider  Learn more about [facility]'s in-office and virtual care options:  - Get Care Now

## 2024-10-09 NOTE — Telephone Encounter (Signed)
 FYI Only or Action Required?: FYI only for provider.  Patient was last seen in primary care on 11/09/2023 by Aimee Charlie FERNS, MD.  Called Nurse Triage reporting Sore Throat.  Symptoms began a week ago.  Interventions attempted: Rest, hydration, or home remedies.  Symptoms are: stable.  Triage Disposition: See PCP When Office is Open (Within 3 Days)  Patient/caregiver understands and will follow disposition?: Yes Reason for Disposition  [1] Sore throat is the only symptom AND [2] present > 48 hours  Answer Assessment - Initial Assessment Questions Denies stuffed nose / sinus pressure. Denies SOB or chest pain. Patient wants to rule out Covid, no available appointments in office, patient wanted to be seen today. set up virtual UC and advised a home covid test.   1. ONSET: When did the throat start hurting? (Hours or days ago)      Last week  2. SEVERITY: How bad is the sore throat? (Scale 1-10; mild, moderate or severe)     Moderate  3. FEVER: Do you have a fever? If Yes, ask: What is your temperature, how was it measured, and when did it start?     Denies  4. OTHER SYMPTOMS: Do you have any other symptoms? (e.g., difficulty breathing, headache, rash)     Headache, chest heaviness, denies cough or congestion, fatigue.  Protocols used: Sore Throat-A-AH  Copied from CRM [id number]. Topic: Clinical - Red Word Triage >> Oct 09, 2024 10:02 AM [name] wrote: Red Word that prompted transfer to Nurse Triage: headache, chest heaviness and sore throat. Symptoms have not resolved with OTC medicine. Would like to rule out COVID.

## 2024-10-09 NOTE — Telephone Encounter (Signed)
 Patient did not arrive to her scheduled office visit today. Contacted patient via telephone call. Patient stated she is experiencing sore throat and HA. Cancelled patient's appointments for today and transferred patient's call to the scheduler.

## 2024-10-09 NOTE — Telephone Encounter (Signed)
 I saw the note from today and they recommended in person OV.  Please schedule when possible.  Thanks.

## 2024-10-11 NOTE — Telephone Encounter (Signed)
 Tanda Cohens L to Me (Selected Message) RW    10/11/24  1:18 PM Spoke with patient she states she did a virtual visit and was advised if it got worse to come in.  States at this time she did not want to make an appointment for in person and will call if she does not get any better.    ============= Noted. Thanks.

## 2024-10-16 ENCOUNTER — Other Ambulatory Visit: Payer: Self-pay

## 2024-10-16 DIAGNOSIS — C50212 Malignant neoplasm of upper-inner quadrant of left female breast: Secondary | ICD-10-CM

## 2024-10-17 ENCOUNTER — Inpatient Hospital Stay: Attending: Hematology

## 2024-10-17 ENCOUNTER — Inpatient Hospital Stay (HOSPITAL_BASED_OUTPATIENT_CLINIC_OR_DEPARTMENT_OTHER): Admitting: Hematology

## 2024-10-17 VITALS — BP 130/82 | HR 73 | Temp 97.3°F | Resp 16 | Ht 67.0 in | Wt 188.2 lb

## 2024-10-17 DIAGNOSIS — C50212 Malignant neoplasm of upper-inner quadrant of left female breast: Secondary | ICD-10-CM

## 2024-10-17 DIAGNOSIS — Z170 Estrogen receptor positive status [ER+]: Secondary | ICD-10-CM

## 2024-10-17 LAB — CBC WITH DIFFERENTIAL (CANCER CENTER ONLY)
Abs Immature Granulocytes: 0.01 K/uL (ref 0.00–0.07)
Basophils Absolute: 0.1 K/uL (ref 0.0–0.1)
Basophils Relative: 1 %
Eosinophils Absolute: 0.2 K/uL (ref 0.0–0.5)
Eosinophils Relative: 3 %
HCT: 39.4 % (ref 36.0–46.0)
Hemoglobin: 13.3 g/dL (ref 12.0–15.0)
Immature Granulocytes: 0 %
Lymphocytes Relative: 37 %
Lymphs Abs: 2 K/uL (ref 0.7–4.0)
MCH: 30.4 pg (ref 26.0–34.0)
MCHC: 33.8 g/dL (ref 30.0–36.0)
MCV: 90 fL (ref 80.0–100.0)
Monocytes Absolute: 0.5 K/uL (ref 0.1–1.0)
Monocytes Relative: 10 %
Neutro Abs: 2.7 K/uL (ref 1.7–7.7)
Neutrophils Relative %: 49 %
Platelet Count: 183 K/uL (ref 150–400)
RBC: 4.38 MIL/uL (ref 3.87–5.11)
RDW: 12.7 % (ref 11.5–15.5)
WBC Count: 5.4 K/uL (ref 4.0–10.5)
nRBC: 0 % (ref 0.0–0.2)

## 2024-10-17 LAB — CMP (CANCER CENTER ONLY)
ALT: 18 U/L (ref 0–44)
AST: 20 U/L (ref 15–41)
Albumin: 4.3 g/dL (ref 3.5–5.0)
Alkaline Phosphatase: 62 U/L (ref 38–126)
Anion gap: 5 (ref 5–15)
BUN: 15 mg/dL (ref 8–23)
CO2: 29 mmol/L (ref 22–32)
Calcium: 9.4 mg/dL (ref 8.9–10.3)
Chloride: 110 mmol/L (ref 98–111)
Creatinine: 0.79 mg/dL (ref 0.44–1.00)
GFR, Estimated: >60 mL/min (ref >60)
Glucose, Bld: 98 mg/dL (ref 70–99)
Potassium: 4 mmol/L (ref 3.5–5.1)
Sodium: 144 mmol/L (ref 135–145)
Total Bilirubin: 0.6 mg/dL (ref 0.0–1.2)
Total Protein: 6.7 g/dL (ref 6.5–8.1)

## 2024-10-17 NOTE — Progress Notes (Signed)
 [facility]   [phone] [phone]) [phone]   Clinic Follow up Note   Patient Care Team: Aimee [facility] RAMAN, MD as PCP - General (Family Medicine) Alvan, Aimee BRAVO, MD (Inactive) as PCP - Cardiology (Cardiology) Aimee Shoulders, MD as Consulting Physician (General Surgery) Aimee Callander, MD as Consulting Physician (Hematology) Aimee Domino, MD as Attending Physician (Radiation Oncology) Aimee Pool, MD as Consulting Physician (Obstetrics and Gynecology) [facility], Aimee K, NP as Nurse Practitioner (Nurse Practitioner) Mammography, [facility] as Radiologist (Diagnostic Radiology) Aimee Arlyss, MD as Consulting Physician (Ophthalmology)  Date of Service:  10/17/2024  CHIEF COMPLAINT: f/u of left breast cancer  CURRENT THERAPY:  Adjuvant tamoxifen   Oncology History   Malignant neoplasm of upper-inner quadrant of left breast in female, estrogen receptor positive (HCC)  cT1cN1M0 stage Ib, triple positive, pT2N0 stage IA, grade 3 -Diagnosed 12/2020, s/p left lumpectomy and SLNB 02/13/21 by Dr. Aron -Staging work-up showed small indeterminate lung nodules and an indeterminate left parietal bone lesion which will monitor with future scans, otherwise negative for distant metastasis -She completed 10 of 12 doses of adjuvant Taxol /Abraxane  and herceptin  on 05/21/21, last 2 doses canceled due to her worsening neuropathy. -s/p radiation 06/30/21 - 07/25/21 under Dr. Izell. -most recent mammogram on 01/05/22 was benign. -she took exemestane  08/2021 - 01/2022, discontinued due to constipation and increased joint/bone pain. She switched to tamoxifen  in 03/2022 and is tolerating better.   Assessment & Plan Malignant neoplasm of upper-inner quadrant of left breast, status post treatment, on tamoxifen  Diagnosed in January 2022, currently on tamoxifen  for three years. Recent benign biopsy. Dense breast tissue noted. No new breast changes or significant pain. Occasional pain under the arm.  Tamoxifen  therapy planned for at least five years, potentially extended to seven to ten years due to tolerability and bone benefits. - Ordered contrast-enhanced mammogram for February 2026 - Continue tamoxifen  therapy  Osteopenia Currently taking calcium  and vitamin D  supplements. - Continue calcium  and vitamin D  supplementation - Discussed that tamoxifen  will Str[place]ky her bone  Hair loss secondary to anti-estrogen therapy Hair loss likely secondary to tamoxifen  therapy. Reports significant hair loss, especially when washing hair. Started lysine supplementation. - Continue lysine supplementation  Hot flashes secondary to anti-estrogen therapy Experiencing hot flashes, worsening with heat. No new joint pain or symptoms reported. - Continue to monitor symptoms  Plan - She is doing well, lab reviewed, exam unremarkable, no clinical concern for recurrence. - Continue tamoxifen  - Lab and follow-up with NP in 6 months - will order contrast-enhanced mammogram in February 2026 at [facility]   SUMMARY OF ONCOLOGIC HISTORY: Oncology History Overview Note  Cancer Staging Malignant neoplasm of upper-inner quadrant of left breast in female, estrogen receptor positive (HCC) Staging form: Breast, AJCC 8th Edition - Clinical stage from 01/14/2020: Stage IB (cT1c, cN1, cM0, G3, ER+, PR+, HER2+) - Signed by Aimee Callander, MD on 01/21/2021 Stage prefix: Initial diagnosis - Pathologic stage from 02/13/2021: Stage IA (pT2, pN0, cM0, G3, ER+, PR+, HER2+) - Signed by Heilingoetter, Cassandra L, PA-C on 03/07/2021 Stage prefix: Initial diagnosis Nuclear grade: G3 Histologic grading system: 3 grade system    Malignant neoplasm of upper-inner quadrant of left breast in female, estrogen receptor positive (HCC)  01/14/2020 Cancer Staging   Staging form: Breast, AJCC 8th Edition - Clinical stage from 01/14/2020: Stage IB (cT1c, cN1, cM0, G3, ER+, PR+, HER2+) - Signed by Aimee Callander, MD on 01/21/2021 Stage prefix: Initial  diagnosis   01/02/2021 Mammogram   Left breast with 1.9x1x1.8 cm  complex cyst at 12:00 position, 4cmfn, versus saloid mass with calcifications, irregulr, increased in size. Biopsy recommended.    01/13/2021 Initial Biopsy   Diagnosis 1. Breast, left, needle core biopsy, 11 o'clock, 4cmfn - INVASIVE DUCTAL CARCINOMA - SEE COMMENT 2. Lymph node, needle/core biopsy, left axilla - FIBROVASCULAR AND ADIPOSE TISSUE - NO LYMPHOID TISSUE OR CARCINOMA IDENTIFIED Microscopic Comment 1. Based on the biopsy, the carcinoma appears Nottingham grade 3 of 3 and measures 1.2 cm in greatest linear extent. Prognostic markers (ER/PR/ki-67/HER2) are pending and will be reported in an addendum. Dr. Jodene reviewed the case and agrees with the above diagnosis. These results were called to [facility] on January 14, 2021   01/13/2021 Receptors her2   1. PROGNOSTIC INDICATORS Results: IMMUNOHISTOCHEMICAL AND MORPHOMETRIC ANALYSIS PERFORMED MANUALLY The tumor cells are POSITIVE for Her2 (3+). Estrogen Receptor: 95%, POSITIVE, STRONG STAINING INTENSITY Progesterone  Receptor: 85%, POSITIVE, STRONG STAINING INTENSITY Proliferation Marker Ki67: 30%   01/16/2021 Initial Diagnosis   Malignant neoplasm of upper-inner quadrant of left breast in female, estrogen receptor positive (HCC)   01/23/2021 Imaging   MRI Breast  IMPRESSION: 1. Known malignancy measuring 2.9 centimeters in the UPPER INNER QUADRANT of the LEFT breast. There is no evidence for involvement of the pectoralis muscle or implant. 2. No evidence for lymphadenopathy. 3. RIGHT breast is negative. 4. Bilateral retropectoral saline implants.   02/09/2021 Imaging   MRI Brain IMPRESSION: No evidence of intracranial metastatic disease.   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     02/13/2021 Surgery   LEFT BREAST LUMPECTOMY WITH RADIOACTIVE SEED AND SENTINEL LYMPH NODE BIOPSY WITH SEED TARGETED NODE and PAC Placed by Dr Aimee   02/13/2021  Pathology Results   FINAL MICROSCOPIC DIAGNOSIS:   A. BREAST, LEFT, LUMPECTOMY:  - Invasive ductal carcinoma, 2.6 cm, Nottingham grade 3 of 3.  - Ductal carcinoma in situ, high grade with central necrosis.  - Margins of resection:       - Invasive carcinoma broadly involves the anterior and inferior  margins.  Invasive carcinoma is focally < 1 mm from each the superior  and medial margins.       - DCIS is focally < 1 mm from the inferior margin.  DCIS is focally  1 mm from each the anterior and superior margins.  DCIS is focally 1-2  mm from the medial margin.  - Biopsy clip.  - See oncology table.   B. BREAST, LEFT, ANTERIOR TISSUE, EXCISION:  - Residual invasive and in-situ ductal carcinoma.  - Residual invasive ductal carcinoma broadly involves the posterior  margin.  - Residual DCIS is focally 1 mm from the posterior margin.   C. BREAST, LEFT, INFERIOR MARGIN, EXCISION:  - No carcinoma identified.   D. SENTINEL LYMPH NODE, LEFT AXILLARY #1, BIOPSY:  - No carcinoma identified in one lymph node (0/1).   E. SENTINEL LYMPH NODE, LEFT AXILLARY #2, BIOPSY:  - Biopsy site.  - No distinct nodal tissue identified.  - No carcinoma identified.   F. SENTINEL LYMPH NODE, LEFT AXILLARY #3, BIOPSY:  - No carcinoma identified in one lymph node (0/1).   G. SENTINEL LYMPH NODE, LEFT AXILLARY #4, BIOPSY:  - No carcinoma identified in one lymph node (0/1).    02/13/2021 Cancer Staging   Staging form: Breast, AJCC 8th Edition - Pathologic stage from 02/13/2021: Stage IA (pT2, pN0, cM0, G3, ER+, PR+, HER2+) - Signed by Heilingoetter, Cassandra L, PA-C on 03/07/2021 Stage prefix: Initial diagnosis Nuclear grade: G3 Histologic grading  system: 3 grade system   03/18/2021 - 02/19/2022 Chemotherapy   Patient is on Treatment Plan : BREAST Paclitaxel  + Trastuzumab  q7d / Trastuzumab  q21d     06/2021 - 07/2021 Radiation Therapy   Adjuvant radiation per Dr. Izell   08/20/2021 Imaging   CT Chest w/o  contrast  IMPRESSION: No definite evidence of pulmonary metastatic disease.   5 mm indeterminate right lower lobe pulmonary nodule. Comparison with prior examinations, if available, is recommended to determine chronicity. No follow-up needed if patient is low-risk. In absence of priors, Non-contrast chest CT can be considered in 12 months if patient is high-risk. This recommendation follows the consensus statement: Guidelines for Management of Incidental Pulmonary Nodules Detected on CT Images: From the Fleischner Society 2017; Radiology 2017; 284:228-243.   Mild coronary artery calcification.   08/20/2021 Imaging   CT Head w/o contrast  IMPRESSION: No acute abnormality. No evidence of intracranial metastatic disease.   08/2021 -  Anti-estrogen oral therapy   Began adjuvant exemestane ; changed to tamoxifen  02/2022   11/13/2021 Survivorship   SCP delivered by Aimee Burton, NP      Discussed the use of AI scribe software for clinical note transcription with the patient, who gave verbal consent to proceed.  History of Present Illness Aimee [facility] is a 68 year old female with breast cancer who presents for follow-up.  She has undergone regular evaluations for her breast cancer, including biopsies, all of which have returned benign results. Her last visit in July followed a palpable finding in her breast, leading to an ultrasound, mammogram, and biopsy, all confirming benign results. Her next mammogram is scheduled for February as part of her annual screening.  She is currently on tamoxifen  and atorvastatin , along with calcium  and vitamin D  supplements. Recently, she started taking lysine for hair loss, which has led her to cut six inches off her hair due to significant hair loss.  She experiences hot flashes, which have worsened with colder weather and indoor heating. Occasionally, she feels discomfort in her shin bones but denies severe pain. She had a severe cramp-like pain  under her rib cage that resolved after a few minutes. No new pain or symptoms are reported.  She regularly performs breast self-examinations and notes many lumps and bumps but no persistent changes. Occasionally, she experiences pain under her arm.     All other systems were reviewed with the patient and are negative.  MEDICAL HISTORY:  Past Medical History:  Diagnosis Date   Arthritis    Breast cancer (HCC)    Cataract    Colon polyps    Endometriosis    Family history of colon cancer    Family history of colonic polyps    Fuchs' corneal dystrophy    GERD (gastroesophageal reflux disease)    H/O bilateral breast implants    Heartburn    High blood pressure    History of colon polyps    HPV in female    HSV (herpes simplex virus) infection    Hyperlipidemia    Migraine    no longer having migraines   Osteoarthritis    Osteopenia    Osteopenia    Port-A-Cath in place 03/18/2021   S/P TAH (total abdominal hysterectomy) 1991   Vitamin D  deficiency     SURGICAL HISTORY: Past Surgical History:  Procedure Laterality Date   ABDOMINAL HYSTERECTOMY     BREAST BIOPSY     Benign.   BREAST ENHANCEMENT SURGERY  BREAST LUMPECTOMY Bilateral 1993 and 1995   BREAST LUMPECTOMY WITH RADIOACTIVE SEED AND SENTINEL LYMPH NODE BIOPSY Left 02/13/2021   Procedure: LEFT BREAST LUMPECTOMY WITH RADIOACTIVE SEED AND SENTINEL LYMPH NODE BIOPSY WITH SEED TARGETED NODE;  Surgeon: Aimee Shoulders, MD;  Location: [facility];  Service: General;  Laterality: Left;   CATARACT EXTRACTION, BILATERAL     CESAREAN SECTION     PARTIAL HYSTERECTOMY     PLACEMENT OF BREAST IMPLANTS  1993   PORT-A-CATH REMOVAL Right 11/04/2021   Procedure: REMOVAL PORT-A-CATH;  Surgeon: Aimee Shoulders, MD;  Location: [facility];  Service: General;  Laterality: Right;   PORTACATH PLACEMENT Right 02/13/2021   Procedure: INSERTION PORT-A-CATH;  Surgeon: Aimee Shoulders, MD;  Location: [facility];   Service: General;  Laterality: Right;   TONSILLECTOMY     WISDOM TOOTH EXTRACTION      I have reviewed the social history and family history with the patient and they are unchanged from previous note.  ALLERGIES:  is allergic to ibuprofen, omnicef [cefdinir], proton pump inhibitors, roxicodone  [oxycodone  hcl], and vioxx [rofecoxib].  MEDICATIONS:  Current Outpatient Medications  Medication Sig Dispense Refill   L-Lysine 1000 MG TABS      atorvastatin  (LIPITOR) 20 MG tablet TAKE 1 TABLET BY MOUTH EVERY DAY 90 tablet 3   Calcium  Carbonate-Vitamin D  (CALCIUM  600+D PO) Take 1 tablet by mouth daily.     Cholecalciferol (VITAMIN D3) 125 MCG (5000 UT) CAPS Take 1 capsule (5,000 Units total) by mouth daily. 30 capsule 0   omeprazole  (PRILOSEC) 20 MG capsule Take 20 mg by mouth daily.     tamoxifen  (NOLVADEX ) 20 MG tablet Take 1 tablet (20 mg total) by mouth daily. 90 tablet 3   valACYclovir  (VALTREX ) 500 MG tablet Take 1 tablet (500 mg total) by mouth 2 (two) times daily as needed.     Vitamin B Complex-C CAPS Take 1 capsule by mouth.     No current facility-administered medications for this visit.    PHYSICAL EXAMINATION: ECOG PERFORMANCE STATUS: 0 - Asymptomatic  Vitals:   10/17/24 0857  BP: 130/82  Pulse: 73  Resp: 16  Temp: (!) 97.3 F (36.3 C)  SpO2: 98%   Wt Readings from Last 3 Encounters:  10/17/24 188 lb 3.2 oz (85.4 kg)  09/15/24 182 lb (82.6 kg)  08/07/24 180 lb (81.6 kg)     GENERAL:alert, no distress and comfortable SKIN: skin color, texture, turgor are normal, no rashes or significant lesions EYES: normal, Conjunctiva are pink and non-injected, sclera clear NECK: supple, thyroid normal size, non-tender, without nodularity LYMPH:  no palpable lymphadenopathy in the cervical, axillary  LUNGS: clear to auscultation and percussion with normal breathing effort HEART: regular rate & rhythm and no murmurs and no lower extremity edema ABDOMEN:abdomen soft, non-tender  and normal bowel sounds Musculoskeletal:no cyanosis of digits and no clubbing  NEURO: alert & oriented x 3 with fluent speech, no focal motor/sensory deficits Breasts: Breast inspection showed them to be symmetrical with no nipple discharge. Palpation of the breasts and axilla revealed no obvious mass that I could appreciate.  Physical Exam   LABORATORY DATA:  I have reviewed the data as listed    Latest Ref Rng & Units 10/17/2024    8:35 AM 08/07/2024   11:08 AM 04/10/2024   10:34 AM  CBC  WBC 4.0 - 10.5 K/uL 5.4  5.8  5.5   Hemoglobin 12.0 - 15.0 g/dL 86.6  85.9  86.1   Hematocrit  36.0 - 46.0 % 39.4  43.6  40.1   Platelets 150 - 400 K/uL 183  196  152         Latest Ref Rng & Units 10/17/2024    8:35 AM 08/07/2024   11:08 AM 04/10/2024   10:34 AM  CMP  Glucose 70 - 99 mg/dL 98  94  98   BUN 8 - 23 mg/dL 15  18  21    Creatinine 0.44 - 1.00 mg/dL 9.20  9.22  9.14   Sodium 135 - 145 mmol/L 144  142  141   Potassium 3.5 - 5.1 mmol/L 4.0  4.9  4.5   Chloride 98 - 111 mmol/L 110  106  109   CO2 22 - 32 mmol/L 29  24  27    Calcium  8.9 - 10.3 mg/dL 9.4  9.5  9.6   Total Protein 6.5 - 8.1 g/dL 6.7   7.0   Total Bilirubin 0.0 - 1.2 mg/dL 0.6   0.6   Alkaline Phos 38 - 126 U/L 62   53   AST 15 - 41 U/L 20   31   ALT 0 - 44 U/L 18   37       RADIOGRAPHIC STUDIES: I have personally reviewed the radiological images as listed and agreed with the findings in the report. No results found.    No orders of the defined types were placed in this encounter.  All questions were answered. The patient knows to call the clinic with any problems, questions or concerns. No barriers to learning was detected. The total time spent in the appointment was 25 minutes, including review of chart and various tests results, discussions about plan of care and coordination of care plan     Onita Mattock, MD 10/17/2024

## 2024-10-17 NOTE — Assessment & Plan Note (Signed)
cT1cN1M0 stage Ib, triple positive, pT2N0 stage IA, grade 3 -Diagnosed 12/2020, s/p left lumpectomy and SLNB 02/13/21 by Dr. Donell Beers -Staging work-up showed small indeterminate lung nodules and an indeterminate left parietal bone lesion which will monitor with future scans, otherwise negative for distant metastasis -She completed 10 of 12 doses of adjuvant Taxol/Abraxane and herceptin on 05/21/21, last 2 doses canceled due to her worsening neuropathy. -s/p radiation 06/30/21 - 07/25/21 under Dr. Basilio Cairo. -most recent mammogram on 01/05/22 was benign. -she took exemestane 08/2021 - 01/2022, discontinued due to constipation and increased joint/bone pain. She switched to tamoxifen in 03/2022 and is tolerating better.

## 2024-11-06 ENCOUNTER — Ambulatory Visit: Attending: Family Medicine

## 2024-11-06 ENCOUNTER — Encounter: Payer: Self-pay | Admitting: Family Medicine

## 2024-11-06 ENCOUNTER — Ambulatory Visit (INDEPENDENT_AMBULATORY_CARE_PROVIDER_SITE_OTHER): Admitting: Family Medicine

## 2024-11-06 VITALS — BP 136/78 | HR 60 | Temp 97.9°F | Ht 67.0 in | Wt 188.0 lb

## 2024-11-06 DIAGNOSIS — R5383 Other fatigue: Secondary | ICD-10-CM | POA: Diagnosis not present

## 2024-11-06 DIAGNOSIS — R002 Palpitations: Secondary | ICD-10-CM | POA: Diagnosis not present

## 2024-11-06 DIAGNOSIS — K219 Gastro-esophageal reflux disease without esophagitis: Secondary | ICD-10-CM | POA: Diagnosis not present

## 2024-11-06 LAB — VITAMIN B12: Vitamin B-12: 356 pg/mL (ref 211–911)

## 2024-11-06 LAB — TSH: TSH: 1.61 u[IU]/mL (ref 0.35–5.50)

## 2024-11-06 NOTE — Patient Instructions (Addendum)
 Let me know if you can't get set up with the Zio patch.  Take care.  Glad to see you. Go to the lab on the way out.   If you have mychart we'll likely use that to update you.    Try taking prilosec twice a day for now.  See if that helps.   Update me as needed.

## 2024-11-06 NOTE — Progress Notes (Unsigned)
 Mild facial tingling.  Has tingling in the feet.  Some pain in the fingers when her hands get cold.  She can tolerate tamoxifen  but having hot flashes.    D/w pt about prev cardiology eval.  Occ extra beats noted.  D/w pt about trying to get cardiac monitor set up.  She can still exercise but she occ notes sx with sig exertion.  Offered zio patch.    Fatigue noted.  TSH and B12 pending.  Not known to snore.  Not waking with gasping for air.    She can feel a pressure at the chest that isn't exertional.  Can have a cough with a deep breath.  Still on prilosec.  Belching and burping throughout the day but not as much acidic reflux as prior to starting PPI.    Meds, vitals, and allergies reviewed.   ROS: Per HPI unless specifically indicated in ROS section

## 2024-11-08 DIAGNOSIS — K219 Gastro-esophageal reflux disease without esophagitis: Secondary | ICD-10-CM | POA: Insufficient documentation

## 2024-11-08 DIAGNOSIS — R002 Palpitations: Secondary | ICD-10-CM | POA: Insufficient documentation

## 2024-11-08 DIAGNOSIS — R5383 Other fatigue: Secondary | ICD-10-CM | POA: Insufficient documentation

## 2024-11-08 NOTE — Assessment & Plan Note (Signed)
 She improved with daily PPI use before.  Discussed options.  She does not have exertional symptoms. She can try taking prilosec twice a day for now.  See if that helps.  Update me as needed.

## 2024-11-08 NOTE — Assessment & Plan Note (Signed)
 TSH and B12 pending.  See notes on labs.  Not known to snore.  Not waking gasping for air.  She has had tingling in the feet and also on the face.  It is unclear if any of her symptoms are related to tamoxifen .  She is having hot flashes which could be attributed to tamoxifen .  I think it makes sense to check basic labs today and then go from there.  She agrees with plan.

## 2024-11-08 NOTE — Assessment & Plan Note (Signed)
 Reasonable to check TSH and get Zio patch set up.  She can let me know if she does not get that delivered.  Update me as needed.

## 2024-11-10 ENCOUNTER — Encounter: Payer: Self-pay | Admitting: Emergency Medicine

## 2024-11-10 ENCOUNTER — Ambulatory Visit: Admission: EM | Admit: 2024-11-10 | Discharge: 2024-11-10 | Disposition: A | Discharge status: Home / Self Care

## 2024-11-10 DIAGNOSIS — H6991 Unspecified Eustachian tube disorder, right ear: Secondary | ICD-10-CM

## 2024-11-10 MED ORDER — PREDNISONE 50 MG PO TABS: ORAL_TABLET | ORAL | 0 refills | Status: DC

## 2024-11-10 NOTE — Discharge Instructions (Addendum)
 You may use nettie pot with distilled bottled water and flonase help with eustachian tube dysfunction.  Your blood pressure is elevated in office, however refrain from taking Sudafed until cleared by your cardiologist.  I will prescribe a short course of steroids as well to help decrease inflammation and open up the ears.  If these do not help with symptoms we may need to go to an ear nose and throat doctor.

## 2024-11-10 NOTE — ED Triage Notes (Addendum)
 Pt reports R ear pain that started in the middle of the night. Notes sharp, shooting pains every few mins that shoot down the R side of her neck. Denies fevers, sore throat, and nasal congestion.   Pt is currently wearing heart monitor due to a trial she is completing with her cardiologist. Wanted provider to be aware.

## 2024-11-10 NOTE — ED Provider Notes (Signed)
 [facility]    CSN: [id number] Arrival date & time: 11/10/24  1030      History   Chief Complaint Chief Complaint  Patient presents with   Otalgia    HPI Aimee Mcclain is a 68 y.o. female.   Patient presents today due to right ear pain since last night.  Patient denies use of medication for pain, denies loss of hearing, or nasal congestion.  Patient states that she takes tamoxifen  which causes persistent nasal drainage.  The history is provided by the patient.  Otalgia   Past Medical History:  Diagnosis Date   Arthritis    Breast cancer (HCC)    Cataract    Colon polyps    Endometriosis    Family history of colon cancer    Family history of colonic polyps    Fuchs' corneal dystrophy    GERD (gastroesophageal reflux disease)    H/O bilateral breast implants    Heartburn    High blood pressure    History of colon polyps    HPV in female    HSV (herpes simplex virus) infection    Hyperlipidemia    Migraine    no longer having migraines   Osteoarthritis    Osteopenia    Osteopenia    Port-A-Cath in place 03/18/2021   S/P TAH (total abdominal hysterectomy) 1991   Vitamin D  deficiency     Patient Active Problem List   Diagnosis Date Noted   Palpitation 11/08/2024   Other fatigue 11/08/2024   GERD (gastroesophageal reflux disease) 11/08/2024   SUI (stress urinary incontinence, female) 04/26/2024   Chronic idiopathic constipation 04/26/2024   Urinary frequency 04/26/2024   Elevated blood pressure reading without diagnosis of hypertension 11/09/2023   Non-alcoholic fatty liver disease 06/24/2022   Endometriosis 06/24/2022   Paronychia of great toe, right 01/16/2022   Pain of right great toe 01/16/2022   Loose toenail 01/16/2022   Leukopenia 12/17/2021   HTN (hypertension) 06/22/2021   Herpes simplex 06/19/2021   Osteopenia 06/19/2021   Medicare welcome exam 05/21/2021   Malignant neoplasm of upper-inner quadrant of left breast in  female, 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   Chronic migraine 08/21/2020   Screening mammogram, encounter for 06/30/2020   Paresthesia 06/09/2020   Neuropathy 02/14/2020   Atypical chest pain 02/14/2020   Healthcare maintenance 02/14/2020   Migraine    Advance care planning 01/16/2019   Family history of colon cancer     Past Surgical History:  Procedure Laterality Date   ABDOMINAL HYSTERECTOMY     BREAST BIOPSY     Benign.   BREAST ENHANCEMENT SURGERY     BREAST LUMPECTOMY Bilateral 1993 and 1995   BREAST LUMPECTOMY WITH RADIOACTIVE SEED AND SENTINEL LYMPH NODE BIOPSY Left 02/13/2021   Procedure: LEFT BREAST LUMPECTOMY WITH RADIOACTIVE SEED AND SENTINEL LYMPH NODE BIOPSY WITH SEED TARGETED NODE;  Surgeon: Aron Shoulders, MD;  Location: [facility];  Service: General;  Laterality: Left;   CATARACT EXTRACTION, BILATERAL     CESAREAN SECTION     PARTIAL HYSTERECTOMY     PLACEMENT OF BREAST IMPLANTS  1993   PORT-A-CATH REMOVAL Right 11/04/2021   Procedure: REMOVAL PORT-A-CATH;  Surgeon: Aron Shoulders, MD;  Location: [facility];  Service: General;  Laterality: Right;   PORTACATH PLACEMENT Right 02/13/2021   Procedure: INSERTION PORT-A-CATH;  Surgeon: Aron Shoulders, MD;  Location: [facility];  Service: General;  Laterality: Right;   TONSILLECTOMY     WISDOM TOOTH  EXTRACTION      OB History     Gravida  2   Para  2   Term  2   Preterm      AB      Living  2      SAB      IAB      Ectopic      Multiple      Live Births               Home Medications    Prior to Admission medications   Medication Sig Start Date End Date Taking? Authorizing Provider  atorvastatin  (LIPITOR) 20 MG tablet TAKE 1 TABLET BY MOUTH EVERY DAY 01/31/24  Yes Branch, Ronal BRAVO, MD  Calcium  Carbonate-Vitamin D  (CALCIUM  600+D PO) Take 1 tablet by mouth daily.   Yes [provider]  Cholecalciferol (VITAMIN D3) 125 MCG (5000 UT) CAPS Take 1 capsule  (5,000 Units total) by mouth daily. 01/14/23  Yes Berkeley Adelita PENNER, MD  L-Lysine 1000 MG TABS  10/09/24  Yes [provider]  omeprazole  (PRILOSEC) 20 MG capsule Take 20 mg by mouth 2 (two) times daily before a meal.   Yes [provider]  predniSONE (DELTASONE) 50 MG tablet Take 1 tab po daily 11/10/24  Yes Andra Krabbe C, PA-C  tamoxifen  (NOLVADEX ) 20 MG tablet Take 1 tablet (20 mg total) by mouth daily. 04/10/24  Yes Burton, [name] K, NP  valACYclovir  (VALTREX ) 500 MG tablet Take 1 tablet (500 mg total) by mouth 2 (two) times daily as needed. 02/12/20  Yes Cleatus Arlyss RAMAN, MD  Vitamin B Complex-C CAPS Take 1 capsule by mouth. Patient not taking: Reported on 11/10/2024    [provider]    Family History Family History  Problem Relation Age of Onset   Dementia Maternal Grandmother    Heart disease Maternal Grandfather    Colon cancer Maternal Grandfather        dx over 77   Colon cancer Father        d. 75   Heart disease Mother    Colon cancer Mother        dx late 12s-70s   Rectal cancer Mother 11   Diabetes Brother    Colon polyps Brother    Diabetes Brother    Colon polyps Brother    Lymphoma Maternal Aunt    Heart disease Maternal Uncle    Crohn's disease Daughter    Esophageal cancer Neg Hx    Stomach cancer Neg Hx    Breast cancer Neg Hx     Social History Social History   Tobacco Use   Smoking status: Former    Current packs/day: 0.00    Average packs/day: 0.5 packs/day for 10.0 years (5.0 ttl pk-yrs)    Types: Cigarettes    Start date: 12/15/1983    Quit date: 12/14/1993    Years since quitting: 30.9   Smokeless tobacco: Never  Vaping Use   Vaping status: Never Used  Substance Use Topics   Alcohol use: Yes    Comment: very rare   Drug use: No     Allergies   Cefdinir, Ibuprofen, Proton pump inhibitors, Roxicodone  [oxycodone  hcl], and Vioxx [rofecoxib]   Review of Systems Review of Systems  HENT:  Positive for ear  pain.      Physical Exam Triage Vital Signs ED Triage Vitals  Encounter Vitals Group     BP 11/10/24 1156 (!) 162/95     Girls  Systolic BP Percentile --      Girls Diastolic BP Percentile --      Boys Systolic BP Percentile --      Boys Diastolic BP Percentile --      Pulse Rate 11/10/24 1156 77     Resp 11/10/24 1156 14     Temp 11/10/24 1156 97.8 F (36.6 C)     Temp Source 11/10/24 1156 Oral     SpO2 11/10/24 1156 98 %     Weight --      Height --      Head Circumference --      Peak Flow --      Pain Score 11/10/24 1153 5     Pain Loc --      Pain Education --      Exclude from Growth Chart --    No data found.  Updated Vital Signs BP (!) 160/91 (BP Location: Right Arm)   Pulse 77   Temp 97.8 F (36.6 C) (Oral)   Resp 14   SpO2 98%   Visual Acuity Right Eye Distance:   Left Eye Distance:   Bilateral Distance:    Right Eye Near:   Left Eye Near:    Bilateral Near:     Physical Exam Vitals and nursing note reviewed.  Constitutional:      General: She is not in acute distress.    Appearance: Normal appearance. She is not ill-appearing, toxic-appearing or diaphoretic.  HENT:     Right Ear: A middle ear effusion (serous fluid, bubbles present) is present.     Left Ear: Tympanic membrane, ear canal and external ear normal.     Nose: Congestion (moderately enlarged turbinates) present. No rhinorrhea.  Eyes:     General: No scleral icterus. Cardiovascular:     Rate and Rhythm: Normal rate and regular rhythm.     Heart sounds: Normal heart sounds.  Pulmonary:     Effort: Pulmonary effort is normal. No respiratory distress.     Breath sounds: Normal breath sounds. No wheezing or rhonchi.  Skin:    General: Skin is warm.  Neurological:     Mental Status: She is alert and oriented to person, place, and time.  Psychiatric:        Mood and Affect: Mood normal.        Behavior: Behavior normal.      UC Treatments / Results  Labs (all labs ordered are  listed, but only abnormal results are displayed) Labs Reviewed - No data to display  EKG   Radiology No results found.  Procedures Procedures (including critical care time)  Medications Ordered in UC Medications - No data to display  Initial Impression / Assessment and Plan / UC Course  I have reviewed the triage vital signs and the nursing notes.  Pertinent labs & imaging results that were available during my care of the patient were reviewed by me and considered in my medical decision making (see chart for details).    Final Clinical Impressions(s) / UC Diagnoses   Final diagnoses:  Dysfunction of right eustachian tube     Discharge Instructions      You may use nettie pot with distilled bottled water and flonase help with eustachian tube dysfunction.  Your blood pressure is elevated in office, however refrain from taking Sudafed until cleared by your cardiologist.  I will prescribe a short course of steroids as well to help decrease inflammation and open up the ears.  If  these do not help with symptoms we may need to go to an ear nose and throat doctor.    ED Prescriptions     Medication Sig Dispense Auth. Provider   predniSONE (DELTASONE) 50 MG tablet Take 1 tab po daily 3 tablet Andra Corean BROCKS, PA-C      PDMP not reviewed this encounter.   Andra Corean BROCKS, PA-C 11/10/24 1229

## 2024-11-12 ENCOUNTER — Ambulatory Visit: Payer: Self-pay | Admitting: Family Medicine

## 2024-11-23 ENCOUNTER — Other Ambulatory Visit: Payer: Self-pay | Admitting: Cardiology

## 2024-11-23 DIAGNOSIS — R9431 Abnormal electrocardiogram [ECG] [EKG]: Secondary | ICD-10-CM

## 2024-11-23 DIAGNOSIS — R002 Palpitations: Secondary | ICD-10-CM

## 2024-11-24 ENCOUNTER — Ambulatory Visit: Admission: EM | Admit: 2024-11-24 | Discharge: 2024-11-24 | Disposition: A | Discharge status: Home / Self Care

## 2024-11-24 ENCOUNTER — Encounter: Payer: Self-pay | Admitting: Emergency Medicine

## 2024-11-24 ENCOUNTER — Ambulatory Visit: Payer: Self-pay

## 2024-11-24 DIAGNOSIS — H109 Unspecified conjunctivitis: Secondary | ICD-10-CM

## 2024-11-24 MED ORDER — CIPROFLOXACIN HCL 0.3 % OP SOLN: 1.0000 [drp] | OPHTHALMIC | 0 refills | Status: AC

## 2024-11-24 NOTE — ED Provider Notes (Signed)
 [facility]    CSN: [id number] Arrival date & time: 11/24/24  0859      History   Chief Complaint No chief complaint on file.   HPI Aimee Mcclain is a 68 y.o. female.   Pt presents today due to red eyes bilaterally with purulent drainage. Pt states that she has been using OTC eye drops with mild relief. Pt states that her eyes are itchy but denies pain, sensitivity to light, and use of contact lenses.   The history is provided by the patient.    Past Medical History:  Diagnosis Date   Arthritis    Breast cancer (HCC)    Cataract    Colon polyps    Endometriosis    Family history of colon cancer    Family history of colonic polyps    Fuchs' corneal dystrophy    GERD (gastroesophageal reflux disease)    H/O bilateral breast implants    Heartburn    High blood pressure    History of colon polyps    HPV in female    HSV (herpes simplex virus) infection    Hyperlipidemia    Migraine    no longer having migraines   Osteoarthritis    Osteopenia    Osteopenia    Port-A-Cath in place 03/18/2021   S/P TAH (total abdominal hysterectomy) 1991   Vitamin D  deficiency     Patient Active Problem List   Diagnosis Date Noted   Palpitation 11/08/2024   Other fatigue 11/08/2024   GERD (gastroesophageal reflux disease) 11/08/2024   SUI (stress urinary incontinence, female) 04/26/2024   Chronic idiopathic constipation 04/26/2024   Urinary frequency 04/26/2024   Elevated blood pressure reading without diagnosis of hypertension 11/09/2023   Non-alcoholic fatty liver disease 06/24/2022   Endometriosis 06/24/2022   Paronychia of great toe, right 01/16/2022   Pain of right great toe 01/16/2022   Loose toenail 01/16/2022   Leukopenia 12/17/2021   HTN (hypertension) 06/22/2021   Herpes simplex 06/19/2021   Osteopenia 06/19/2021   Medicare welcome exam 05/21/2021   Malignant neoplasm of upper-inner quadrant of left breast in female, estrogen receptor positive  (HCC) 01/16/2021   Chronic migraine 08/21/2020   Screening mammogram, encounter for 06/30/2020   Paresthesia 06/09/2020   Neuropathy 02/14/2020   Atypical chest pain 02/14/2020   Healthcare maintenance 02/14/2020   Migraine    Advance care planning 01/16/2019   Family history of colon cancer     Past Surgical History:  Procedure Laterality Date   ABDOMINAL HYSTERECTOMY     BREAST BIOPSY     Benign.   BREAST ENHANCEMENT SURGERY     BREAST LUMPECTOMY Bilateral 1993 and 1995   BREAST LUMPECTOMY WITH RADIOACTIVE SEED AND SENTINEL LYMPH NODE BIOPSY Left 02/13/2021   Procedure: LEFT BREAST LUMPECTOMY WITH RADIOACTIVE SEED AND SENTINEL LYMPH NODE BIOPSY WITH SEED TARGETED NODE;  Surgeon: Aron Shoulders, MD;  Location: [facility];  Service: General;  Laterality: Left;   CATARACT EXTRACTION, BILATERAL     CESAREAN SECTION     PARTIAL HYSTERECTOMY     PLACEMENT OF BREAST IMPLANTS  1993   PORT-A-CATH REMOVAL Right 11/04/2021   Procedure: REMOVAL PORT-A-CATH;  Surgeon: Aron Shoulders, MD;  Location: [facility];  Service: General;  Laterality: Right;   PORTACATH PLACEMENT Right 02/13/2021   Procedure: INSERTION PORT-A-CATH;  Surgeon: Aron Shoulders, MD;  [facility];  Service: General;  Laterality: Right;   TONSILLECTOMY     WISDOM TOOTH EXTRACTION  OB History     Gravida  2   Para  2   Term  2   Preterm      AB      Living  2      SAB      IAB      Ectopic      Multiple      Live Births               Home Medications    Prior to Admission medications  Medication Sig Start Date End Date Taking? Authorizing Provider  ciprofloxacin  (CILOXAN ) 0.3 % ophthalmic solution Place 1 drop into both eyes every 2 (two) hours for 7 days. Administer 1 drop, every 2 hours, while awake, for 2 days. Then 1 drop, every 4 hours, while awake, for the next 5 days. 11/24/24 12/01/24 Yes Andra Krabbe C, PA-C  omeprazole  (PRILOSEC) 20 MG  capsule Take 20 mg by mouth 2 (two) times daily before a meal.   Yes [provider]  atorvastatin  (LIPITOR) 20 MG tablet TAKE 1 TABLET BY MOUTH EVERY DAY 01/31/24   Alvan Ronal BRAVO, MD  Calcium  Carbonate-Vitamin D  (CALCIUM  600+D PO) Take 1 tablet by mouth daily.    [provider]  Cholecalciferol (VITAMIN D3) 125 MCG (5000 UT) CAPS Take 1 capsule (5,000 Units total) by mouth daily. 01/14/23   Berkeley Adelita PENNER, MD  L-Lysine 1000 MG TABS  10/09/24   [provider]  predniSONE  (DELTASONE ) 50 MG tablet Take 1 tab po daily 11/10/24   Andra Krabbe BROCKS, PA-C  tamoxifen  (NOLVADEX ) 20 MG tablet Take 1 tablet (20 mg total) by mouth daily. 04/10/24   Burton, [name] K, NP  valACYclovir  (VALTREX ) 500 MG tablet Take 1 tablet (500 mg total) by mouth 2 (two) times daily as needed. 02/12/20   Cleatus Arlyss RAMAN, MD  Vitamin B Complex-C CAPS Take 1 capsule by mouth. Patient not taking: Reported on 11/10/2024    [provider]    Family History Family History  Problem Relation Age of Onset   Dementia Maternal Grandmother    Heart disease Maternal Grandfather    Colon cancer Maternal Grandfather        dx over 74   Colon cancer Father        d. 68   Heart disease Mother    Colon cancer Mother        dx late 3s-70s   Rectal cancer Mother 75   Diabetes Brother    Colon polyps Brother    Diabetes Brother    Colon polyps Brother    Lymphoma Maternal Aunt    Heart disease Maternal Uncle    Crohn's disease Daughter    Esophageal cancer Neg Hx    Stomach cancer Neg Hx    Breast cancer Neg Hx     Social History Social History[1]   Allergies   Cefdinir, Ibuprofen, Proton pump inhibitors, Roxicodone  [oxycodone  hcl], and Vioxx [rofecoxib]   Review of Systems Review of Systems   Physical Exam Triage Vital Signs ED Triage Vitals  Encounter Vitals Group     BP 11/24/24 0921 (!) 168/89     Girls Systolic BP Percentile --      Girls Diastolic BP Percentile  --      Boys Systolic BP Percentile --      Boys Diastolic BP Percentile --      Pulse Rate 11/24/24 0921 71     Resp 11/24/24 0921 18  Temp 11/24/24 0921 98.3 F (36.8 C)     Temp Source 11/24/24 0921 Oral     SpO2 11/24/24 0921 98 %     Weight 11/24/24 0921 188 lb 0.8 oz (85.3 kg)     Height --      Head Circumference --      Peak Flow --      Pain Score 11/24/24 0920 0     Pain Loc --      Pain Education --      Exclude from Growth Chart --    No data found.  Updated Vital Signs BP (!) 168/89 (BP Location: Left Arm)   Pulse 71   Temp 98.3 F (36.8 C) (Oral)   Resp 18   Wt 188 lb 0.8 oz (85.3 kg)   SpO2 98%   BMI 29.45 kg/m   Visual Acuity Right Eye Distance: 20/40 Left Eye Distance: 20/40 Bilateral Distance: 20/30 (Non corrective)  Right Eye Near:   Left Eye Near:    Bilateral Near:     Physical Exam Vitals and nursing note reviewed.  Constitutional:      General: She is not in acute distress.    Appearance: Normal appearance. She is not ill-appearing, toxic-appearing or diaphoretic.  Eyes:     General: Lids are normal. Lids are everted, no foreign bodies appreciated. Vision grossly intact. Gaze aligned appropriately. No scleral icterus.    Extraocular Movements: Extraocular movements intact.     Conjunctiva/sclera:     Right eye: Right conjunctiva is injected. No exudate.    Left eye: Left conjunctiva is injected. No exudate. Cardiovascular:     Rate and Rhythm: Normal rate and regular rhythm.     Heart sounds: Normal heart sounds.  Pulmonary:     Effort: Pulmonary effort is normal. No respiratory distress.     Breath sounds: Normal breath sounds. No wheezing or rhonchi.  Skin:    General: Skin is warm.  Neurological:     Mental Status: She is alert and oriented to person, place, and time.  Psychiatric:        Mood and Affect: Mood normal.        Behavior: Behavior normal.      UC Treatments / Results  Labs (all labs ordered are listed, but  only abnormal results are displayed) Labs Reviewed - No data to display  EKG   Radiology No results found.  Procedures Procedures (including critical care time)  Medications Ordered in UC Medications - No data to display  Initial Impression / Assessment and Plan / UC Course  I have reviewed the triage vital signs and the nursing notes.  Pertinent labs & imaging results that were available during my care of the patient were reviewed by me and considered in my medical decision making (see chart for details).     Final Clinical Impressions(s) / UC Diagnoses   Final diagnoses:  Bacterial conjunctivitis of both eyes     Discharge Instructions      You have been diagnosed with bacterial conjunctivitis or pinkeye today.  You are contagious until you take eyedrops for 24 hours.  Practice good hand hygiene, use soap and water.  Be sure not to share towels and washcloths, change them daily.  Symptoms should improve in 3 days, but use eyedrops for 7.  If you experience changes in vision you need to follow-up with ophthalmologist as soon as possible.     ED Prescriptions     Medication Sig Dispense  Auth. Provider   ciprofloxacin  (CILOXAN ) 0.3 % ophthalmic solution Place 1 drop into both eyes every 2 (two) hours for 7 days. Administer 1 drop, every 2 hours, while awake, for 2 days. Then 1 drop, every 4 hours, while awake, for the next 5 days. 5 mL Andra Corean BROCKS, PA-C      PDMP not reviewed this encounter.    [1]  Social History Tobacco Use   Smoking status: Former    Current packs/day: 0.00    Average packs/day: 0.5 packs/day for 10.0 years (5.0 ttl pk-yrs)    Types: Cigarettes    Start date: 12/15/1983    Quit date: 12/14/1993    Years since quitting: 30.9   Smokeless tobacco: Never  Vaping Use   Vaping status: Never Used  Substance Use Topics   Alcohol use: Yes    Comment: very rare   Drug use: No     Andra Corean BROCKS, PA-C 11/24/24 0935

## 2024-11-24 NOTE — Telephone Encounter (Signed)
 Agree. Thanks

## 2024-11-24 NOTE — Discharge Instructions (Signed)
 You have been diagnosed with bacterial conjunctivitis or pinkeye today.  You are contagious until you take eyedrops for 24 hours.  Practice good hand hygiene, use soap and water .  Be sure not to share towels and washcloths, change them daily.  Symptoms should improve in 3 days, but use eyedrops for 7.  If you experience changes in vision you need to follow-up with ophthalmologist as soon as possible.

## 2024-11-24 NOTE — ED Triage Notes (Signed)
 Pt presents c/o eye concern x 1 days. Pt states,  when I got up yesterday both my eyes were blood red and matted. I put some drops in them and it was okay. They felt kinda dry all day so I kept using eye drops. I got up this morning and they weren't matted this morning but still red and my vision is hazy. A little itchy but not constant and no pain.

## 2024-11-24 NOTE — Telephone Encounter (Signed)
 FYI Only or Action Required?: FYI only for provider: recommended to Urgent Care for evaluation.  Patient was last seen in primary care on 11/06/2024 by Aimee Arlyss RAMAN, MD.  Called Nurse Triage reporting Eye Problem.  Symptoms began yesterday.  Interventions attempted: Rest, hydration, or home remedies.  Symptoms are: unchanged.  Triage Disposition: See HCP Within 4 Hours (Or PCP Triage)  Patient/caregiver understands and will follow disposition?: Yes  Copied from CRM #[id number]. Topic: Clinical - Red Word Triage >> Nov 24, 2024  8:21 AM Harlene ORN wrote: Red Word that prompted transfer to Nurse Triage: woke up yesterday morning her eyes were matted and bloodshot. Worried she might have pink eye blurry vision pain and itchiness in both eyes Reason for Disposition  New or worsening blurred vision  Answer Assessment - Initial Assessment Questions Patient reports eye redness, blurred vision and itchiness since yesterday. Denies pain. Did endorse waking up yesterday that her eyes were matted. Denies continual discharge. Concerned for possible pink eye. No appointments available in the office or surrounding locations. Recommended patient to urgent care for evaluation.   1. LOCATION: Location: What's red, the eyeball or the outer eyelids? (Note: when callers say the eye is red, they usually mean the sclera is red)       Eyeball is red 2. REDNESS OF SCLERA: Is the redness in one or both eyes? When did the redness start?      Both eyes-woke up yesterday  3. ONSET: When did the eye become red? (e.g., hours, days)      yesterday 4. EYELIDS: Are the eyelids red or swollen? If Yes, ask: How much?      no 5. VISION: Is there any difficulty seeing clearly?      yes 6. ITCHING: Does it feel itchy? If so ask: How bad is it (e.g., Scale 1-10; or mild, moderate, severe)     Yes-mild 7. PAIN: Is there any pain? If Yes, ask: How bad is it? (e.g., Scale 1-10; or mild, moderate,  severe)     no 8. CONTACT LENS: Do you wear contacts?     no 9. CAUSE: What do you think is causing the redness?     unsure 10. OTHER SYMPTOMS: Do you have any other symptoms? (e.g., fever, runny nose, cough, vomiting)       no  Protocols used: Eye - Red Without Pus-A-AH

## 2024-11-29 ENCOUNTER — Other Ambulatory Visit: Payer: Self-pay

## 2024-12-25 ENCOUNTER — Encounter: Payer: Self-pay | Admitting: *Deleted

## 2024-12-25 ENCOUNTER — Ambulatory Visit: Payer: Self-pay | Admitting: Family Medicine

## 2024-12-25 NOTE — Telephone Encounter (Signed)
 Called patient set up for office visit tomorrow. With [employer]

## 2024-12-25 NOTE — Telephone Encounter (Signed)
 I would encourage in office eval however possible.

## 2024-12-25 NOTE — Telephone Encounter (Signed)
 Noted. Thanks.

## 2024-12-25 NOTE — Telephone Encounter (Signed)
 FYI Only or Action Required?: Action required by provider: update on patient condition and requesting med for symptoms.  Patient was last seen in primary care on 11/06/2024 by Aimee Arlyss RAMAN, MD.  Called Nurse Triage reporting Facial Pain.  Symptoms began a week ago.  Interventions attempted: OTC medications: emergen-C and Rest, hydration, or home remedies.  Symptoms are: gradually improving.  Triage Disposition: See Physician Within 24 Hours  Patient/caregiver understands and will follow disposition?: No, wishes to speak with PCP    Copied from CRM [id number]. Topic: Clinical - Red Word Triage >> Dec 25, 2024 10:15 AM Terri MATSU wrote: Red Word that prompted transfer to Nurse Triage: Patient has been experiencing weakness and green mucous since last Friday. Reason for Disposition  [1] Sinus pain (not just congestion) AND [2] fever  Answer Assessment - Initial Assessment Questions This RN recommended pt be examined in next 24 hours, pt refusing disposition, requesting meds be sent in to [facility] on [address] in [place]. Advised pt call back or seek immediate care if any new or worsening symptoms. Sending message to PCP office for call back to pt with further recommendations.   Symptoms: Nasal congestion - couldn't breathe out of nose, green nasal discharge Fever - not confirmed, no chills Sinus pain - pain around teeth   Denies: SOB Chest pain Current fever Cough Headache Sore throat  Protocols used: Sinus Pain or Congestion-A-AH

## 2024-12-26 ENCOUNTER — Ambulatory Visit (INDEPENDENT_AMBULATORY_CARE_PROVIDER_SITE_OTHER): Admitting: General Practice

## 2024-12-26 ENCOUNTER — Encounter: Payer: Self-pay | Admitting: General Practice

## 2024-12-26 VITALS — BP 136/80 | HR 86 | Temp 97.8°F | Ht 67.0 in | Wt 192.8 lb

## 2024-12-26 DIAGNOSIS — J0100 Acute maxillary sinusitis, unspecified: Secondary | ICD-10-CM | POA: Insufficient documentation

## 2024-12-26 MED ORDER — AMOXICILLIN-POT CLAVULANATE 875-125 MG PO TABS: 1.0000 | ORAL_TABLET | Freq: Two times a day (BID) | ORAL | 0 refills | Status: AC

## 2024-12-26 NOTE — Patient Instructions (Addendum)
 Start Augmentin  antibiotics. Take 1 tablet by mouth twice daily for 7 days.   You can try a few things over the counter to help with your symptoms including:  Cough: Delsym or Robitussin (get the off brand, works just as well) Chest Congestion: Mucinex (plain) Nasal Congestion/Ear Pressure/Sinus Pressure: Try using Flonase (fluticasone) nasal spray. Instill 1 spray in each nostril twice daily. This can be purchased over the counter. Body aches, fevers, headache: Ibuprofen (not to exceed 2400 mg in 24 hours) or Acetaminophen -Tylenol  (not to exceed 3000 mg in 24 hours) Runny Nose/Throat Drainage/Sneezing/Itchy or Watery Eyes: An antihistamine such as Zyrtec, Claritin, Xyzal, Allegra  Drink plenty of fluids.  Rest.  Use cool mist humidifier.   It was a pleasure to see you today!

## 2024-12-26 NOTE — Progress Notes (Signed)
 "  Established Patient Office Visit  Subjective   Patient ID: Aimee Mcclain, female    DOB: [birth year]  Age: 69 y.o. MRN: [id number]  Chief Complaint  Patient presents with   Nasal Congestion    And chest congestion, cough, sinus pressure, weakness, fatigue since Wednesday evening. Patient has been taking nyquil and dayquil severe flu. Took covid test and flu tests Sunday and all were negative.     HPI  Aimee Mcclain is a 69 year old female, patient of Dr. Cleatus, presents today for an acute visit.   Discussed the use of AI scribe software for clinical note transcription with the patient, who gave verbal consent to proceed.  History of Present Illness Symptoms began nearly a week ago with chest congestion, nasal congestion, sinus pressure, weakness, and fatigue. The cough started yesterday. She experienced a possible fever on Friday night but did not measure her temperature. She was traveling at the time and felt heat around her body.  Sinus symptoms started with stuffiness on Wednesday night, worsening by Friday night to the point where she could hardly breathe through her nose. She has been experiencing post-nasal drip and has been blowing her nose frequently, especially in the mornings, with green-colored mucus. She reports headache and facial pain, particularly in her teeth, and notes that her head hurts when she coughs. No sore throat or productive cough.  No history of asthma or COPD and has never used an inhaler.   She has a history of smoking but quit a long time ago. No nausea, vomiting, diarrhea, urinary symptoms, or dizziness.  She has been using NyQuil and DayQuil for symptom relief. She has allergies to cefdinir, which have caused rash and itching in the past.      Patient Active Problem List   Diagnosis Date Noted   Acute non-recurrent maxillary sinusitis 12/26/2024   Palpitation 11/08/2024   Other fatigue 11/08/2024   GERD (gastroesophageal reflux disease)  11/08/2024   SUI (stress urinary incontinence, female) 04/26/2024   Chronic idiopathic constipation 04/26/2024   Urinary frequency 04/26/2024   Elevated blood pressure reading without diagnosis of hypertension 11/09/2023   Non-alcoholic fatty liver disease 06/24/2022   Endometriosis 06/24/2022   Paronychia of great toe, right 01/16/2022   Pain of right great toe 01/16/2022   Loose toenail 01/16/2022   Leukopenia 12/17/2021   HTN (hypertension) 06/22/2021   Herpes simplex 06/19/2021   Osteopenia 06/19/2021   Medicare welcome exam 05/21/2021   Malignant neoplasm of upper-inner quadrant of left breast in female, 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   Chronic migraine 08/21/2020   Screening mammogram, encounter for 06/30/2020   Paresthesia 06/09/2020   Neuropathy 02/14/2020   Atypical chest pain 02/14/2020   Healthcare maintenance 02/14/2020   Migraine    Advance care planning 01/16/2019   Family history of colon cancer    Past Medical History:  Diagnosis Date   Arthritis    Breast cancer (HCC)    Cataract    Colon polyps    Endometriosis    Family history of colon cancer    Family history of colonic polyps    Fuchs' corneal dystrophy    GERD (gastroesophageal reflux disease)    H/O bilateral breast implants    Heartburn    High blood pressure    History of colon polyps    HPV in female    HSV (herpes simplex virus) infection    Hyperlipidemia    Migraine    no longer having  migraines   Osteoarthritis    Osteopenia    Osteopenia    Port-A-Cath in place 03/18/2021   S/P TAH (total abdominal hysterectomy) 1991   Vitamin D  deficiency    Past Surgical History:  Procedure Laterality Date   ABDOMINAL HYSTERECTOMY     BREAST BIOPSY     Benign.   BREAST ENHANCEMENT SURGERY     BREAST LUMPECTOMY Bilateral 1993 and 1995   BREAST LUMPECTOMY WITH RADIOACTIVE SEED AND SENTINEL LYMPH NODE BIOPSY Left 02/13/2021   Procedure: LEFT BREAST LUMPECTOMY WITH RADIOACTIVE  SEED AND SENTINEL LYMPH NODE BIOPSY WITH SEED TARGETED NODE;  Surgeon: Aron Shoulders, MD;  Location: [facility];  Service: General;  Laterality: Left;   CATARACT EXTRACTION, BILATERAL     CESAREAN SECTION     PARTIAL HYSTERECTOMY     PLACEMENT OF BREAST IMPLANTS  1993   PORT-A-CATH REMOVAL Right 11/04/2021   Procedure: REMOVAL PORT-A-CATH;  Surgeon: Aron Shoulders, MD;  Location: [facility];  Service: General;  Laterality: Right;   PORTACATH PLACEMENT Right 02/13/2021   Procedure: INSERTION PORT-A-CATH;  Surgeon: Aron Shoulders, MD;  Location: [facility];  Service: General;  Laterality: Right;   TONSILLECTOMY     WISDOM TOOTH EXTRACTION     Allergies[1]       12/26/2024    2:38 PM 11/06/2024    9:50 AM 10/17/2024    8:00 AM  Depression screen PHQ 2/9  Decreased Interest 0 0 0  Down, Depressed, Hopeless 0 0 0  PHQ - 2 Score 0 0 0  Altered sleeping 0 3   Tired, decreased energy 1 3   Change in appetite 1 3   Feeling bad or failure about yourself  0 0   Trouble concentrating 0 0   Moving slowly or fidgety/restless 0 0   Suicidal thoughts 0 0   PHQ-9 Score 2 9   Difficult doing work/chores Not difficult at all Not difficult at all        12/26/2024    2:38 PM 11/06/2024    9:50 AM  GAD 7 : Generalized Anxiety Score  Nervous, Anxious, on Edge 0 0  Control/stop worrying 0 0  Worry too much - different things 0 0  Trouble relaxing 0 0  Restless 0 0  Easily annoyed or irritable 0 0  Afraid - awful might happen 0 0  Total GAD 7 Score 0 0  Anxiety Difficulty Not difficult at all       Review of Systems  Constitutional:  Positive for malaise/fatigue. Negative for chills and fever.  HENT:  Positive for congestion, ear pain and sinus pain. Negative for sore throat.   Respiratory:  Positive for cough. Negative for shortness of breath.   Cardiovascular:  Negative for chest pain.  Gastrointestinal:  Negative for abdominal pain, constipation, diarrhea,  heartburn, nausea and vomiting.  Genitourinary:  Negative for dysuria, frequency and urgency.  Neurological:  Positive for headaches. Negative for dizziness.  Endo/Heme/Allergies:  Negative for polydipsia.  Psychiatric/Behavioral:  Negative for depression and suicidal ideas. The patient is not nervous/anxious.       Objective:     BP 136/80   Pulse 86   Temp 97.8 F (36.6 C) (Temporal)   Ht 5' 7 (1.702 m)   Wt 192 lb 12.8 oz (87.5 kg)   SpO2 98%   BMI 30.20 kg/m  BP Readings from Last 3 Encounters:  12/26/24 136/80  11/24/24 (!) 168/89  11/10/24 (!) 160/91   [employer]  Readings from Last 3 Encounters:  12/26/24 192 lb 12.8 oz (87.5 kg)  11/24/24 188 lb 0.8 oz (85.3 kg)  11/06/24 188 lb (85.3 kg)      Physical Exam Vitals and nursing note reviewed.  Constitutional:      Appearance: Normal appearance.  HENT:     Right Ear: Tympanic membrane, ear canal and external ear normal.     Left Ear: Tympanic membrane, ear canal and external ear normal.     Nose: Congestion and rhinorrhea present.     Mouth/Throat:     Pharynx: Oropharynx is clear.  Eyes:     Conjunctiva/sclera: Conjunctivae normal.  Cardiovascular:     Rate and Rhythm: Normal rate and regular rhythm.     Pulses: Normal pulses.     Heart sounds: Normal heart sounds.  Pulmonary:     Effort: Pulmonary effort is normal.     Breath sounds: Normal breath sounds.  Neurological:     Mental Status: She is alert and oriented to person, place, and time.  Psychiatric:        Mood and Affect: Mood normal.        Behavior: Behavior normal.        Thought Content: Thought content normal.        Judgment: Judgment normal.      No results found for any visits on 12/26/24.     The 10-year ASCVD risk score (Arnett DK, et al., 2019) is: 7.4%    Assessment & Plan:  Acute non-recurrent maxillary sinusitis -     Amoxicillin -Pot Clavulanate; Take 1 tablet by mouth 2 (two) times daily for 7 days.  Dispense: 14 tablet;  Refill: 0  Acute cough   Assessment and Plan Assessment & Plan Acute maxillary sinusitis Suspected maxillary sinus infection with progressing symptoms. Negative COVID and flu tests.  - Prescribed Augmentin  1 tablet twice a day for 7 days. - Advised use of Zyrtec or Claritin for postnasal drip. - Recommended Mucinex for chest congestion. - Suggested ribatussin or Delsym over the counter for cough. - Advised increasing fluid intake. - Recommended using a humidifier while sleeping. - Instructed to report if symptoms do not improve or worsen. - Advised ER visit if chest pain, shortness of breath, or difficulty breathing occurs.    Return if symptoms worsen or fail to improve.    Carrol Aurora, NP     [1]  Allergies Allergen Reactions   Cefdinir Itching    Other Reaction(s): flushing   Ibuprofen Swelling    She has tolerated aleve   Proton Pump Inhibitors     Headache, but Can tolerate prilosec   Roxicodone  [Oxycodone  Hcl] Rash    Developed rash on face and chest   Vioxx [Rofecoxib] Swelling and Rash   "

## 2024-12-29 ENCOUNTER — Encounter: Payer: Self-pay | Admitting: Family Medicine

## 2024-12-29 ENCOUNTER — Ambulatory Visit (INDEPENDENT_AMBULATORY_CARE_PROVIDER_SITE_OTHER)

## 2024-12-29 ENCOUNTER — Ambulatory Visit (INDEPENDENT_AMBULATORY_CARE_PROVIDER_SITE_OTHER): Admitting: Family Medicine

## 2024-12-29 ENCOUNTER — Ambulatory Visit: Payer: Self-pay

## 2024-12-29 VITALS — BP 158/89 | HR 83 | Ht 67.0 in | Wt 193.8 lb

## 2024-12-29 DIAGNOSIS — R052 Subacute cough: Secondary | ICD-10-CM | POA: Diagnosis not present

## 2024-12-29 MED ORDER — AZITHROMYCIN 250 MG PO TABS: ORAL_TABLET | ORAL | 0 refills | Status: AC

## 2024-12-29 NOTE — Telephone Encounter (Signed)
 Thank you for getting her rechecked.

## 2024-12-29 NOTE — Telephone Encounter (Signed)
 FYI Only or Action Required?: FYI only for provider: appointment scheduled on 12/29/24.  Patient was last seen in primary care on 12/26/2024 by Vincente Shivers, NP.  Called Nurse Triage reporting Shortness of Breath.  Symptoms began yesterday .  Interventions attempted: OTC medications: Dayqul and Nyquil and Robitussin DM and ABX.  Symptoms are: gradually worsening.  Triage Disposition: See HCP Within 4 Hours (Or PCP Triage)  Patient/caregiver understands and will follow disposition?: yes         Reason for Disposition  [1] MILD difficulty breathing (e.g., minimal/no SOB at rest, SOB with walking, pulse < 100) AND [2] NEW-onset or WORSE than normal  Answer Assessment - Initial Assessment Questions Pt on abx for sinusitis  but has developed cough that has become very frequent and causing her to have coughing episodes and SOB. No openings at [facility] and advised to go to clinic near her home.       1. RESPIRATORY STATUS: Describe your breathing? (e.g., wheezing, shortness of breath, unable to speak, severe coughing)      SOB,  2. ONSET: When did this breathing problem begin?      Yesterday   3. PATTERN Does the difficult breathing come and go, or has it been constant since it started?      Coughing makes SOB worse  4. SEVERITY: How bad is your breathing? (e.g., mild, moderate, severe)      Mild to moderate   8. CAUSE: What do you think is causing the breathing problem?      Related to sinus infection  9. OTHER SYMPTOMS: Do you have any other symptoms? (e.g., chest pain, cough, dizziness, fever, runny nose)     Cough, runny nose  Protocols used: Breathing Difficulty-A-AH

## 2024-12-29 NOTE — Progress Notes (Signed)
 "  Established Patient Office Visit  Subjective    Patient ID: Aimee Mcclain, female    DOB: [birth year]  Age: 69 y.o. MRN: [id number]  CC:  Chief Complaint  Patient presents with   Cough    Pt reports cough started last Wednesday     HPI Aimee Mcclain presents with complaint of cough. Patient reports that she thought it was a sinus infection and was put on amox for 7 days. She is doing better but continues to cough.   Outpatient Encounter Medications as of 12/29/2024  Medication Sig   amoxicillin -clavulanate (AUGMENTIN ) 875-125 MG tablet Take 1 tablet by mouth 2 (two) times daily for 7 days.   atorvastatin  (LIPITOR) 20 MG tablet TAKE 1 TABLET BY MOUTH EVERY DAY   azithromycin  (ZITHROMAX ) 250 MG tablet Take 2 tablets on day 1, then 1 tablet daily on days 2 through 5   Calcium  Carbonate-Vitamin D  (CALCIUM  600+D PO) Take 1 tablet by mouth daily.   Cholecalciferol (VITAMIN D3) 125 MCG (5000 UT) CAPS Take 1 capsule (5,000 Units total) by mouth daily.   L-Lysine 1000 MG TABS    omeprazole  (PRILOSEC) 20 MG capsule Take 20 mg by mouth 2 (two) times daily before a meal.   tamoxifen  (NOLVADEX ) 20 MG tablet Take 1 tablet (20 mg total) by mouth daily.   valACYclovir  (VALTREX ) 500 MG tablet Take 1 tablet (500 mg total) by mouth 2 (two) times daily as needed.   Vitamin B Complex-C CAPS Take 1 capsule by mouth.   No facility-administered encounter medications on file as of 12/29/2024.    Past Medical History:  Diagnosis Date   Arthritis    Breast cancer (HCC)    Cataract    Colon polyps    Endometriosis    Family history of colon cancer    Family history of colonic polyps    Fuchs' corneal dystrophy    GERD (gastroesophageal reflux disease)    H/O bilateral breast implants    Heartburn    High blood pressure    History of colon polyps    HPV in female    HSV (herpes simplex virus) infection    Hyperlipidemia    Migraine    no longer having migraines   Osteoarthritis     Osteopenia    Osteopenia    Port-A-Cath in place 03/18/2021   S/P TAH (total abdominal hysterectomy) 1991   Vitamin D  deficiency     Past Surgical History:  Procedure Laterality Date   ABDOMINAL HYSTERECTOMY     BREAST BIOPSY     Benign.   BREAST ENHANCEMENT SURGERY     BREAST LUMPECTOMY Bilateral 1993 and 1995   BREAST LUMPECTOMY WITH RADIOACTIVE SEED AND SENTINEL LYMPH NODE BIOPSY Left 02/13/2021   Procedure: LEFT BREAST LUMPECTOMY WITH RADIOACTIVE SEED AND SENTINEL LYMPH NODE BIOPSY WITH SEED TARGETED NODE;  Surgeon: Aron Shoulders, MD;  Location: [facility];  Service: General;  Laterality: Left;   CATARACT EXTRACTION, BILATERAL     CESAREAN SECTION     PARTIAL HYSTERECTOMY     PLACEMENT OF BREAST IMPLANTS  1993   PORT-A-CATH REMOVAL Right 11/04/2021   Procedure: REMOVAL PORT-A-CATH;  Surgeon: Aron Shoulders, MD;  Location: [facility];  Service: General;  Laterality: Right;   PORTACATH PLACEMENT Right 02/13/2021   Procedure: INSERTION PORT-A-CATH;  Surgeon: Aron Shoulders, MD;  Location: [facility];  Service: General;  Laterality: Right;   TONSILLECTOMY     WISDOM TOOTH EXTRACTION  Family History  Problem Relation Age of Onset   Dementia Maternal Grandmother    Heart disease Maternal Grandfather    Colon cancer Maternal Grandfather        dx over 58   Colon cancer Father        d. 28   Heart disease Mother    Colon cancer Mother        dx late 75s-70s   Rectal cancer Mother 38   Diabetes Brother    Colon polyps Brother    Diabetes Brother    Colon polyps Brother    Lymphoma Maternal Aunt    Heart disease Maternal Uncle    Crohn's disease Daughter    Esophageal cancer Neg Hx    Stomach cancer Neg Hx    Breast cancer Neg Hx     Social History   Socioeconomic History   Marital status: Married    Spouse name: [name]   Number of children: 2   Years of education: some college   Highest education level: Some college, no degree   Occupational History   Occupation: Airline Pilot  Tobacco Use   Smoking status: Former    Current packs/day: 0.00    Average packs/day: 0.5 packs/day for 10.0 years (5.0 ttl pk-yrs)    Types: Cigarettes    Start date: 12/15/1983    Quit date: 12/14/1993    Years since quitting: 31.0   Smokeless tobacco: Never  Vaping Use   Vaping status: Never Used  Substance and Sexual Activity   Alcohol use: Yes    Comment: very rare   Drug use: No   Sexual activity: Yes    Birth control/protection: Surgical    Comment: hyst  Other Topics Concern   Not on file  Social History Narrative   Lives at home with husband.   Married 1988.   Right-handed.   Social Drivers of Health   Tobacco Use: Medium Risk (12/29/2024)   Patient History    Smoking Tobacco Use: Former    Smokeless Tobacco Use: Never    Passive Exposure: Not on file  Financial Resource Strain: Low Risk (10/30/2024)   Overall Financial Resource Strain (CARDIA)    Difficulty of Paying Living Expenses: Not hard at all  Food Insecurity: No Food Insecurity (10/30/2024)   Epic    Worried About Programme Researcher, Broadcasting/film/video in the Last Year: Never true    Ran Out of Food in the Last Year: Never true  Transportation Needs: No Transportation Needs (10/30/2024)   Epic    Lack of Transportation (Medical): No    Lack of Transportation (Non-Medical): No  Physical Activity: Sufficiently Active (10/30/2024)   Exercise Vital Sign    Days of Exercise per Week: 3 days    Minutes of Exercise per Session: 60 min  Stress: Stress Concern Present (10/30/2024)   [employer] of Occupational Health - Occupational Stress Questionnaire    Feeling of Stress: To some extent  Social Connections: Moderately Integrated (10/30/2024)   Social Connection and Isolation Panel    Frequency of Communication with Friends and Family: More than three times a week    Frequency of Social Gatherings with Friends and Family: Once a week    Attends Religious Services: Never     Database Administrator or Organizations: Yes    Attends Banker Meetings: More than 4 times per year    Marital Status: Married  Catering Manager Violence: Not At Risk (05/25/2024)   Epic  Fear of Current or Ex-Partner: No    Emotionally Abused: No    Physically Abused: No    Sexually Abused: No  Depression (PHQ2-9): Low Risk (12/26/2024)   Depression (PHQ2-9)    PHQ-2 Score: 2  Recent Concern: Depression (PHQ2-9) - Medium Risk (11/06/2024)   Depression (PHQ2-9)    PHQ-2 Score: 9  Alcohol Screen: Low Risk (10/30/2024)   Alcohol Screen    Last Alcohol Screening Score (AUDIT): 1  Housing: Low Risk (10/30/2024)   Epic    Unable to Pay for Housing in the Last Year: No    Number of Times Moved in the Last Year: 0    Homeless in the Last Year: No  Utilities: Not At Risk (05/25/2024)   Epic    Threatened with loss of utilities: No  Health Literacy: Adequate Health Literacy (05/25/2024)   B1300 Health Literacy    Frequency of need for help with medical instructions: Never    Review of Systems  Respiratory:  Positive for cough.   All other systems reviewed and are negative.       Objective    BP (!) 158/89   Pulse 83   Ht 5' 7 (1.702 m)   Wt 193 lb 12.8 oz (87.9 kg)   SpO2 97%   BMI 30.35 kg/m   Physical Exam Vitals and nursing note reviewed.  Constitutional:      General: She is not in acute distress. Cardiovascular:     Rate and Rhythm: Normal rate and regular rhythm.  Pulmonary:     Effort: Pulmonary effort is normal.     Breath sounds: Normal breath sounds.  Abdominal:     Palpations: Abdomen is soft.     Tenderness: There is no abdominal tenderness.  Neurological:     General: No focal deficit present.     Mental Status: She is alert and oriented to person, place, and time.         Assessment & Plan:   Subacute cough -     DG Chest 2 View; Future  Other orders -     Azithromycin ; Take 2 tablets on day 1, then 1 tablet daily on  days 2 through 5  Dispense: 6 tablet; Refill: 0     Return if symptoms worsen or fail to improve.   Tanda Raguel SQUIBB, MD  "

## 2025-01-02 ENCOUNTER — Ambulatory Visit: Payer: Self-pay | Admitting: Family Medicine

## 2025-01-03 NOTE — Progress Notes (Unsigned)
 "   Cardiology Clinic Note   Patient Name: Darin Redmann Kyer Date of Encounter: 01/03/2025  Primary Care Provider:  Cleatus Arlyss RAMAN, MD Primary Cardiologist:  Alvan Ronal BRAVO, MD (Inactive)  Patient Profile    Carlisia Geno Pinon 69 year old female presents to the clinic today for follow-up evaluation of her hypertension and palpitations.  Past Medical History    Past Medical History:  Diagnosis Date   Arthritis    Breast cancer (HCC)    Cataract    Colon polyps    Endometriosis    Family history of colon cancer    Family history of colonic polyps    Fuchs' corneal dystrophy    GERD (gastroesophageal reflux disease)    H/O bilateral breast implants    Heartburn    High blood pressure    History of colon polyps    HPV in female    HSV (herpes simplex virus) infection    Hyperlipidemia    Migraine    no longer having migraines   Osteoarthritis    Osteopenia    Osteopenia    Port-A-Cath in place 03/18/2021   S/P TAH (total abdominal hysterectomy) 1991   Vitamin D  deficiency    Past Surgical History:  Procedure Laterality Date   ABDOMINAL HYSTERECTOMY     BREAST BIOPSY     Benign.   BREAST ENHANCEMENT SURGERY     BREAST LUMPECTOMY Bilateral 1993 and 1995   BREAST LUMPECTOMY WITH RADIOACTIVE SEED AND SENTINEL LYMPH NODE BIOPSY Left 02/13/2021   Procedure: LEFT BREAST LUMPECTOMY WITH RADIOACTIVE SEED AND SENTINEL LYMPH NODE BIOPSY WITH SEED TARGETED NODE;  Surgeon: Aron Shoulders, MD;  Location: [facility];  Service: General;  Laterality: Left;   CATARACT EXTRACTION, BILATERAL     CESAREAN SECTION     PARTIAL HYSTERECTOMY     PLACEMENT OF BREAST IMPLANTS  1993   PORT-A-CATH REMOVAL Right 11/04/2021   Procedure: REMOVAL PORT-A-CATH;  Surgeon: Aron Shoulders, MD;  Location: [facility];  Service: General;  Laterality: Right;   PORTACATH PLACEMENT Right 02/13/2021   Procedure: INSERTION PORT-A-CATH;  Surgeon: Aron Shoulders, MD;  Location: [facility];  Service: General;  Laterality: Right;   TONSILLECTOMY     WISDOM TOOTH EXTRACTION      Allergies  Allergies  Allergen Reactions   Cefdinir Itching    Other Reaction(s): flushing   Ibuprofen Swelling    She has tolerated aleve   Proton Pump Inhibitors     Headache, but Can tolerate prilosec   Roxicodone  [Oxycodone  Hcl] Rash    Developed rash on face and chest   Vioxx [Rofecoxib] Swelling and Rash    History of Present Illness    Corrina Steffensen Lites has a PMH of breast cancer, migraines, palpitations, and hypertension.  Her CT showed mild CAC.  Her 10-year ASCVD score was previously calculated at 5.6%.  Her LDL was previously noted to be greater than 100 and she was started on atorvastatin .  Echocardiogram 3/22 showed an EF of 60 to 65%, strain -22, normal RV and no valvular disease.  Echocardiogram 03/20/2022 showed GLS-22.  Her EKG 01/13/2023 showed normal sinus rhythm.  She was previously seen and evaluated by Dr. Ronal Alvan on 01/24/2024.  During that time she noted that when she would exert herself at the gym she would feel like she may pass out and feel dizzy.  She felt fine with normal daily activities.  She was able to carry groceries and go up stairs  without significant dyspnea on exertion or chest discomfort.  She did note mild dyspnea.  It was felt that her dyspnea was related to deconditioning.  Her atorvastatin  20 mg daily was continued.  A 7-day cardiac event monitor was ordered and showed a minimum heart rate of 43, maximum heart rate of 185, average heart rate of 70 bpm.  She was prominently in sinus rhythm.  She was noted to have 12 episodes of SVT with the fastest interval lasting 16 beats and the max heart rate during these episodes was 185.  The longest episode lasted for 9.8 seconds and had an average heart rate of 142 bpm.  She reported some leg pain which improved when she was changed to tamoxifen .  She denied history of cardiac disease.  Her mother has atrial  fibrillation.  Her maternal grandfather and uncle had heart disease.  Her father died of MI at age 17.  She smoked for about 10 years.  She presented to the clinic 08/07/24 for follow-up evaluation and stated she had started feeling worse over the prior 2 weeks.  She noticed pressure in her chest and throat.  She felt that she was having more episodes similar to what she had before.  She describes episodes as short-lived but noticed dizzy sensation.  We reviewed her previous event monitor.  She expressed understanding.  She did note some blood in her stool which she attributed to hemorrhoids.  She described it as bright red.  Her blood pressure was well-controlled.  Her heart rate and EKG were reassuring.  Due to increased episodes I  ordered echocardiogram, CBC, BMP, magnesium, and planned repeat cardiac event monitor.   Follow-up in around 6 weeks was planned.  She noted that she did try to maintain her hydration and regularly carried  water.  She had been trying to reduce her diet drinks.  She did report drinking coffee in the mornings.  She does not drink alcohol.  Her echocardiogram 09/04/2024 showed LVEF of 60 to 65%, mild mitral valve regurgitation, and no significant changes from her previous study.  She contacted nurse triage line on 08/22/2024.  She reported that she had not received cardiac event monitor at that time.  Her lab work from 08/07/2024 showed stable renal function and electrolytes.  Her CBC was within normal limits.  She presents to the clinic today for follow-up evaluation and states she is feeling fine.  She reports that she did not receive her cardiac event monitor.  She continues to notice occasional episodes of palpitations which she describes as waves.  We reviewed her echocardiogram and lab work.  These were both reassuring.  She expressed understanding.  I also reviewed her cardiac monitor from February.  At this time she does not feel that she is having as many episodes.  Her energy  level is stable and she continues to go to the gym 3 times per week.  Her main complaint is with her neuropathy.  She noted this post cancer treatment.  At this time I will defer repeat cardiac monitor.  I instructed her that if she notices increased symptoms and palpitations we will plan to order cardiac monitor.  I will continue her current medication regimen and plan follow-up in 9 to 12 months.  Today she denies chest pain, shortness of breath, and lower extremity edema.    Home Medications    Prior to Admission medications   Medication Sig Start Date End Date Taking? Authorizing Provider  acetaminophen  (TYLENOL ) 500 MG  tablet Take 1,000 mg by mouth every 6 (six) hours as needed for moderate pain (pain score 4-6).    [provider]  atorvastatin  (LIPITOR) 20 MG tablet TAKE 1 TABLET BY MOUTH EVERY DAY 01/31/24   Alvan Ronal BRAVO, MD  Calcium  Carbonate-Vitamin D  (CALCIUM  600+D PO) Take 1 tablet by mouth daily.    [provider]  Cholecalciferol (VITAMIN D3) 125 MCG (5000 UT) CAPS Take 1 capsule (5,000 Units total) by mouth daily. 01/14/23   Berkeley Adelita PENNER, MD  omeprazole  (PRILOSEC) 20 MG capsule Take 20 mg by mouth daily.    [provider]  tamoxifen  (NOLVADEX ) 20 MG tablet Take 1 tablet (20 mg total) by mouth daily. 04/10/24   Burton, [name] K, NP  UNABLE TO FIND Take by mouth daily. Med Name: WEEM gummies   two PO daily    [provider]  valACYclovir  (VALTREX ) 500 MG tablet Take 1 tablet (500 mg total) by mouth 2 (two) times daily as needed. 02/12/20   Cleatus Arlyss RAMAN, MD  Vitamin B Complex-C CAPS Take 1 capsule by mouth.    [provider]    Family History    Family History  Problem Relation Age of Onset   Dementia Maternal Grandmother    Heart disease Maternal Grandfather    Colon cancer Maternal Grandfather        dx over 62   Colon cancer Father        d. 12   Heart disease Mother    Colon cancer Mother        dx late 34s-70s    Rectal cancer Mother 14   Diabetes Brother    Colon polyps Brother    Diabetes Brother    Colon polyps Brother    Lymphoma Maternal Aunt    Heart disease Maternal Uncle    Crohn's disease Daughter    Esophageal cancer Neg Hx    Stomach cancer Neg Hx    Breast cancer Neg Hx    She indicated that her mother is alive. She indicated that her father is deceased. She indicated that both of her brothers are alive. She indicated that her maternal grandmother is deceased. She indicated that her maternal grandfather is deceased. She indicated that her daughter is alive. She indicated that her maternal aunt is deceased. She indicated that her maternal uncle is deceased. She indicated that the status of her neg hx is unknown.  Social History    Social History   Socioeconomic History   Marital status: Married    Spouse name: Elsie Schlottman   Number of children: 2   Years of education: some college   Highest education level: Some college, no degree  Occupational History   Occupation: Airline Pilot  Tobacco Use   Smoking status: Former    Current packs/day: 0.00    Average packs/day: 0.5 packs/day for 10.0 years (5.0 ttl pk-yrs)    Types: Cigarettes    Start date: 12/15/1983    Quit date: 12/14/1993    Years since quitting: 31.0   Smokeless tobacco: Never  Vaping Use   Vaping status: Never Used  Substance and Sexual Activity   Alcohol use: Yes    Comment: very rare   Drug use: No   Sexual activity: Yes    Birth control/protection: Surgical    Comment: hyst  Other Topics Concern   Not on file  Social History Narrative   Lives at home with husband.   Married 1988.   Right-handed.  Social Drivers of Health   Tobacco Use: Medium Risk (12/29/2024)   Patient History    Smoking Tobacco Use: Former    Smokeless Tobacco Use: Never    Passive Exposure: Not on file  Financial Resource Strain: Low Risk (10/30/2024)   Overall Financial Resource Strain (CARDIA)    Difficulty of Paying  Living Expenses: Not hard at all  Food Insecurity: No Food Insecurity (10/30/2024)   Epic    Worried About Programme Researcher, Broadcasting/film/video in the Last Year: Never true    Ran Out of Food in the Last Year: Never true  Transportation Needs: No Transportation Needs (10/30/2024)   Epic    Lack of Transportation (Medical): No    Lack of Transportation (Non-Medical): No  Physical Activity: Sufficiently Active (10/30/2024)   Exercise Vital Sign    Days of Exercise per Week: 3 days    Minutes of Exercise per Session: 60 min  Stress: Stress Concern Present (10/30/2024)   [employer] of Occupational Health - Occupational Stress Questionnaire    Feeling of Stress: To some extent  Social Connections: Moderately Integrated (10/30/2024)   Social Connection and Isolation Panel    Frequency of Communication with Friends and Family: More than three times a week    Frequency of Social Gatherings with Friends and Family: Once a week    Attends Religious Services: Never    Database Administrator or Organizations: Yes    Attends Engineer, Structural: More than 4 times per year    Marital Status: Married  Catering Manager Violence: Not At Risk (05/25/2024)   Epic    Fear of Current or Ex-Partner: No    Emotionally Abused: No    Physically Abused: No    Sexually Abused: No  Depression (PHQ2-9): Low Risk (12/26/2024)   Depression (PHQ2-9)    PHQ-2 Score: 2  Recent Concern: Depression (PHQ2-9) - Medium Risk (11/06/2024)   Depression (PHQ2-9)    PHQ-2 Score: 9  Alcohol Screen: Low Risk (10/30/2024)   Alcohol Screen    Last Alcohol Screening Score (AUDIT): 1  Housing: Low Risk (10/30/2024)   Epic    Unable to Pay for Housing in the Last Year: No    Number of Times Moved in the Last Year: 0    Homeless in the Last Year: No  Utilities: Not At Risk (05/25/2024)   Epic    Threatened with loss of utilities: No  Health Literacy: Adequate Health Literacy (05/25/2024)   B1300 Health Literacy     Frequency of need for help with medical instructions: Never     Review of Systems    General:  No chills, fever, night sweats or weight changes.  Cardiovascular:  No chest pain, dyspnea on exertion, edema, orthopnea, palpitations, paroxysmal nocturnal dyspnea. Dermatological: No rash, lesions/masses Respiratory: No cough, dyspnea Urologic: No hematuria, dysuria Abdominal:   No nausea, vomiting, diarrhea, bright red blood per rectum, melena, or hematemesis Neurologic:  No visual changes, wkns, changes in mental status. All other systems reviewed and are otherwise negative except as noted above.  Physical Exam    VS:  There were no vitals taken for this visit. , BMI There is no height or weight on file to calculate BMI. GEN: Well nourished, well developed, in no acute distress. HEENT: normal. Neck: Supple, no JVD, carotid bruits, or masses. Cardiac: RRR, no murmurs, rubs, or gallops. No clubbing, cyanosis, edema.  Radials/DP/PT 2+ and equal bilaterally.  Respiratory:  Respirations regular and unlabored, clear to  auscultation bilaterally. GI: Soft, nontender, nondistended, BS + x 4. MS: no deformity or atrophy. Skin: warm and dry, no rash. Neuro:  Strength and sensation are intact. Psych: Normal affect.  Accessory Clinical Findings    Recent Labs: 08/07/2024: Magnesium 2.0 10/17/2024: ALT 18; BUN 15; Creatinine 0.79; Hemoglobin 13.3; Platelet Count 183; Potassium 4.0; Sodium 144 11/06/2024: TSH 1.61   Recent Lipid Panel    Component Value Date/Time   CHOL 151 12/17/2022 1225   TRIG 66 12/17/2022 1225   HDL 64 12/17/2022 1225   CHOLHDL 4 02/12/2020 1704   VLDL 17.4 02/12/2020 1704   LDLCALC 74 12/17/2022 1225    No BP recorded.  {Refresh Note OR Click here to enter BP  :1}***    ECG personally reviewed by me today-   none today.   Cardiac event monitor on 01/24/2024   6 triggered event for sinus rhythm, minimal run of SVT.  No significant tachyarrhythmia or  bradyarrhythmia. No atrial fibrillation or flutter.     Patch Wear Time:  6 days and 23 hours (2025-02-14T09:18:07-499 to 2025-02-21T08:27:34-0500)   Patient had a min HR of 43 bpm, max HR of 185 bpm, and avg HR of 70 bpm. Predominant underlying rhythm was Sinus Rhythm. 12 Supraventricular Tachycardia runs occurred, the run with the fastest interval lasting 16 beats with a max rate of 185 bpm, the  longest lasting 9.8 secs with an avg rate of 142 bpm. Supraventricular Tachycardia was detected within +/- 45 seconds of symptomatic patient event(s). Isolated SVEs were rare (<1.0%), SVE Couplets were rare (<1.0%), and SVE Triplets were rare (<1.0%).  Isolated VEs were rare (<1.0%), VE Couplets were rare (<1.0%), and no VE Triplets were present.    Echocardiogram 09/04/2024  IMPRESSIONS     1. Left ventricular ejection fraction, by estimation, is 60 to 65%. The  left ventricle has normal function. The left ventricle has no regional  wall motion abnormalities. There is mild left ventricular hypertrophy.  Left ventricular diastolic parameters  were normal. The average left ventricular global longitudinal strain is  -20.5 %. The global longitudinal strain is normal.   2. Right ventricular systolic function is normal. The right ventricular  size is normal.   3. The mitral valve is normal in structure. Mild mitral valve  regurgitation. No evidence of mitral stenosis.   4. The aortic valve is normal in structure. Aortic valve regurgitation is  not visualized. No aortic stenosis is present.   5. The inferior vena cava is normal in size with greater than 50%  respiratory variability, suggesting right atrial pressure of 3 mmHg.   Comparison(s): No significant change from prior study. Prior images  reviewed side by side.   FINDINGS   Left Ventricle: Left ventricular ejection fraction, by estimation, is 60  to 65%. The left ventricle has normal function. The left ventricle has no  regional wall  motion abnormalities. The average left ventricular global  longitudinal strain is -20.5 %.  Strain was performed and the global longitudinal strain is normal. The  left ventricular internal cavity size was normal in size. There is mild  left ventricular hypertrophy. Left ventricular diastolic parameters were  normal.   Right Ventricle: The right ventricular size is normal. No increase in  right ventricular wall thickness. Right ventricular systolic function is  normal.   Left Atrium: Left atrial size was normal in size.   Right Atrium: Right atrial size was normal in size.   Pericardium: There is no evidence of pericardial effusion.  Mitral Valve: The mitral valve is normal in structure. Mild mitral valve  regurgitation. No evidence of mitral valve stenosis.   Tricuspid Valve: The tricuspid valve is normal in structure. Tricuspid  valve regurgitation is not demonstrated. No evidence of tricuspid  stenosis.   Aortic Valve: The aortic valve is normal in structure. Aortic valve  regurgitation is not visualized. No aortic stenosis is present. Aortic  valve mean gradient measures 3.0 mmHg. Aortic valve peak gradient measures  6.1 mmHg. Aortic valve area, by VTI  measures 3.62 cm.   Pulmonic Valve: The pulmonic valve was normal in structure. Pulmonic valve  regurgitation is not visualized. No evidence of pulmonic stenosis.   Aorta: The aortic root is normal in size and structure.   Venous: The inferior vena cava is normal in size with greater than 50%  respiratory variability, suggesting right atrial pressure of 3 mmHg.   IAS/Shunts: No atrial level shunt detected by color flow Doppler.   Assessment & Plan   1.  SVT, palpitations-echocardiogram reassuring.  Did not receive cardiac event monitor.  Her prior cardiac event monitor showed brief episodes of SVT.  Continues to notice occasional episodes of palpitations that she describes as waves. Avoid triggers caffeine, chocolate,  EtOH, dehydration etc. Continue vagal maneuvers   Hypertension-BP today 112/74 Maintain blood pressure log Heart healthy low-sodium diet Increase physical activity as tolerated  Coronary artery disease-denies chest pain today.   She was previously noted to have CAC on CT. High-fiber diet Increase physical activity as tolerated-reviewed. Continue atorvastatin   Breast CA-she previously had seed radiation of left upper quadrant breast CA. Continue tamoxifen  Follows with oncology  Disposition: Follow-up with Dr. Loni or me in 9-12 months.  Josefa HERO. Kem Parcher NP-C     01/03/2025, 7:55 AM [facility] [address] [place], [place] [address] Office [phone]      I spent 14*** minutes examining this patient, reviewing medications, and using patient centered shared decision making involving their cardiac care.   I spent  20 minutes reviewing past medical history,  medications, and prior cardiac tests.  "

## 2025-01-05 ENCOUNTER — Ambulatory Visit: Admitting: General Practice

## 2025-03-08 ENCOUNTER — Ambulatory Visit: Admitting: General Practice

## 2025-04-16 ENCOUNTER — Inpatient Hospital Stay

## 2025-04-16 ENCOUNTER — Inpatient Hospital Stay: Admitting: Nurse Practitioner

## 2025-05-29 ENCOUNTER — Ambulatory Visit

## 2025-06-25 ENCOUNTER — Ambulatory Visit
